# Patient Record
Sex: Female | Born: 1965 | Race: Black or African American | Hispanic: No | Marital: Married | Smoking: Current every day smoker
Health system: Southern US, Community
[De-identification: ages and names within clinical notes are randomized; demographics above are authoritative.]

## PROBLEM LIST (undated history)

## (undated) DIAGNOSIS — G43909 Migraine, unspecified, not intractable, without status migrainosus: Secondary | ICD-10-CM

## (undated) DIAGNOSIS — J45909 Unspecified asthma, uncomplicated: Secondary | ICD-10-CM

## (undated) DIAGNOSIS — J449 Chronic obstructive pulmonary disease, unspecified: Secondary | ICD-10-CM

## (undated) DIAGNOSIS — G8929 Other chronic pain: Secondary | ICD-10-CM

## (undated) DIAGNOSIS — F419 Anxiety disorder, unspecified: Secondary | ICD-10-CM

## (undated) DIAGNOSIS — R319 Hematuria, unspecified: Secondary | ICD-10-CM

## (undated) DIAGNOSIS — R202 Paresthesia of skin: Secondary | ICD-10-CM

## (undated) DIAGNOSIS — K589 Irritable bowel syndrome without diarrhea: Secondary | ICD-10-CM

## (undated) DIAGNOSIS — IMO0002 Reserved for concepts with insufficient information to code with codable children: Secondary | ICD-10-CM

## (undated) DIAGNOSIS — K219 Gastro-esophageal reflux disease without esophagitis: Secondary | ICD-10-CM

## (undated) DIAGNOSIS — D649 Anemia, unspecified: Secondary | ICD-10-CM

## (undated) DIAGNOSIS — I10 Essential (primary) hypertension: Secondary | ICD-10-CM

## (undated) DIAGNOSIS — M549 Dorsalgia, unspecified: Secondary | ICD-10-CM

## (undated) DIAGNOSIS — M542 Cervicalgia: Secondary | ICD-10-CM

## (undated) HISTORY — PX: ULNAR NERVE REPAIR: SHX2594

## (undated) HISTORY — DX: Hematuria, unspecified: R31.9

## (undated) HISTORY — PX: TUBAL LIGATION: SHX77

## (undated) HISTORY — DX: Reserved for concepts with insufficient information to code with codable children: IMO0002

## (undated) HISTORY — DX: Migraine, unspecified, not intractable, without status migrainosus: G43.909

## (undated) HISTORY — PX: HERNIA REPAIR: SHX51

---

## 1997-12-21 ENCOUNTER — Inpatient Hospital Stay (HOSPITAL_COMMUNITY): Admission: AD | Admit: 1997-12-21 | Discharge: 1997-12-21 | Payer: Self-pay | Admitting: *Deleted

## 1997-12-27 ENCOUNTER — Inpatient Hospital Stay (HOSPITAL_COMMUNITY): Admission: AD | Admit: 1997-12-27 | Discharge: 1997-12-27 | Payer: Self-pay | Admitting: *Deleted

## 1998-02-10 ENCOUNTER — Encounter (HOSPITAL_COMMUNITY): Admission: RE | Admit: 1998-02-10 | Discharge: 1998-02-11 | Payer: Self-pay | Admitting: *Deleted

## 1998-02-10 ENCOUNTER — Inpatient Hospital Stay (HOSPITAL_COMMUNITY): Admission: AD | Admit: 1998-02-10 | Discharge: 1998-02-12 | Payer: Self-pay | Admitting: *Deleted

## 1999-02-02 ENCOUNTER — Emergency Department (HOSPITAL_COMMUNITY): Admission: RE | Admit: 1999-02-02 | Discharge: 1999-02-02 | Payer: Self-pay | Admitting: *Deleted

## 1999-03-05 ENCOUNTER — Encounter: Payer: Self-pay | Admitting: *Deleted

## 1999-03-05 ENCOUNTER — Ambulatory Visit (HOSPITAL_COMMUNITY): Admission: RE | Admit: 1999-03-05 | Discharge: 1999-03-05 | Payer: Self-pay | Admitting: *Deleted

## 2000-04-14 ENCOUNTER — Emergency Department (HOSPITAL_COMMUNITY): Admission: EM | Admit: 2000-04-14 | Discharge: 2000-04-14 | Payer: Self-pay | Admitting: Emergency Medicine

## 2000-04-25 ENCOUNTER — Emergency Department (HOSPITAL_COMMUNITY): Admission: EM | Admit: 2000-04-25 | Discharge: 2000-04-26 | Payer: Self-pay | Admitting: Emergency Medicine

## 2000-04-25 ENCOUNTER — Emergency Department (HOSPITAL_COMMUNITY): Admission: EM | Admit: 2000-04-25 | Discharge: 2000-04-25 | Payer: Self-pay | Admitting: Emergency Medicine

## 2002-07-05 ENCOUNTER — Encounter: Admission: RE | Admit: 2002-07-05 | Discharge: 2002-08-17 | Payer: Self-pay | Admitting: Family Medicine

## 2002-08-14 ENCOUNTER — Emergency Department (HOSPITAL_COMMUNITY): Admission: EM | Admit: 2002-08-14 | Discharge: 2002-08-14 | Payer: Self-pay | Admitting: Emergency Medicine

## 2003-04-30 ENCOUNTER — Emergency Department (HOSPITAL_COMMUNITY): Admission: EM | Admit: 2003-04-30 | Discharge: 2003-04-30 | Payer: Self-pay

## 2003-05-10 ENCOUNTER — Other Ambulatory Visit: Admission: RE | Admit: 2003-05-10 | Discharge: 2003-05-10 | Payer: Self-pay | Admitting: Obstetrics and Gynecology

## 2003-08-27 ENCOUNTER — Inpatient Hospital Stay (HOSPITAL_COMMUNITY): Admission: AD | Admit: 2003-08-27 | Discharge: 2003-08-27 | Payer: Self-pay | Admitting: Obstetrics and Gynecology

## 2003-09-21 ENCOUNTER — Encounter: Admission: RE | Admit: 2003-09-21 | Discharge: 2003-09-21 | Payer: Self-pay | Admitting: Obstetrics and Gynecology

## 2003-10-14 ENCOUNTER — Encounter: Admission: RE | Admit: 2003-10-14 | Discharge: 2003-10-14 | Payer: Self-pay | Admitting: Obstetrics and Gynecology

## 2003-10-25 ENCOUNTER — Encounter: Admission: RE | Admit: 2003-10-25 | Discharge: 2003-10-25 | Payer: Self-pay | Admitting: Obstetrics and Gynecology

## 2003-10-28 ENCOUNTER — Inpatient Hospital Stay (HOSPITAL_COMMUNITY): Admission: AD | Admit: 2003-10-28 | Discharge: 2003-10-31 | Payer: Self-pay | Admitting: Obstetrics and Gynecology

## 2003-10-28 ENCOUNTER — Encounter (INDEPENDENT_AMBULATORY_CARE_PROVIDER_SITE_OTHER): Payer: Self-pay | Admitting: Specialist

## 2003-12-26 ENCOUNTER — Other Ambulatory Visit: Admission: RE | Admit: 2003-12-26 | Discharge: 2003-12-26 | Payer: Self-pay | Admitting: Obstetrics and Gynecology

## 2004-01-27 ENCOUNTER — Ambulatory Visit (HOSPITAL_COMMUNITY): Admission: RE | Admit: 2004-01-27 | Discharge: 2004-01-27 | Payer: Self-pay | Admitting: Obstetrics and Gynecology

## 2004-04-03 ENCOUNTER — Emergency Department (HOSPITAL_COMMUNITY): Admission: EM | Admit: 2004-04-03 | Discharge: 2004-04-04 | Payer: Self-pay | Admitting: Emergency Medicine

## 2005-04-29 ENCOUNTER — Emergency Department (HOSPITAL_COMMUNITY): Admission: EM | Admit: 2005-04-29 | Discharge: 2005-04-29 | Payer: Self-pay | Admitting: Emergency Medicine

## 2005-11-11 ENCOUNTER — Emergency Department (HOSPITAL_COMMUNITY): Admission: EM | Admit: 2005-11-11 | Discharge: 2005-11-11 | Payer: Self-pay | Admitting: Emergency Medicine

## 2005-11-13 ENCOUNTER — Emergency Department (HOSPITAL_COMMUNITY): Admission: EM | Admit: 2005-11-13 | Discharge: 2005-11-14 | Payer: Self-pay | Admitting: Emergency Medicine

## 2006-03-04 ENCOUNTER — Emergency Department (HOSPITAL_COMMUNITY): Admission: EM | Admit: 2006-03-04 | Discharge: 2006-03-04 | Payer: Self-pay | Admitting: Emergency Medicine

## 2006-12-07 ENCOUNTER — Emergency Department (HOSPITAL_COMMUNITY): Admission: EM | Admit: 2006-12-07 | Discharge: 2006-12-07 | Payer: Self-pay | Admitting: Emergency Medicine

## 2007-02-14 ENCOUNTER — Emergency Department (HOSPITAL_COMMUNITY): Admission: EM | Admit: 2007-02-14 | Discharge: 2007-02-14 | Payer: Self-pay | Admitting: Emergency Medicine

## 2008-03-09 ENCOUNTER — Emergency Department (HOSPITAL_COMMUNITY): Admission: EM | Admit: 2008-03-09 | Discharge: 2008-03-10 | Payer: Self-pay | Admitting: Emergency Medicine

## 2008-08-02 ENCOUNTER — Encounter: Admission: RE | Admit: 2008-08-02 | Discharge: 2008-08-02 | Payer: Self-pay | Admitting: Gastroenterology

## 2008-10-15 ENCOUNTER — Emergency Department (HOSPITAL_COMMUNITY): Admission: EM | Admit: 2008-10-15 | Discharge: 2008-10-15 | Payer: Self-pay | Admitting: Emergency Medicine

## 2008-11-10 ENCOUNTER — Ambulatory Visit (HOSPITAL_COMMUNITY): Admission: RE | Admit: 2008-11-10 | Discharge: 2008-11-11 | Payer: Self-pay | Admitting: Surgery

## 2009-01-18 ENCOUNTER — Emergency Department (HOSPITAL_COMMUNITY): Admission: EM | Admit: 2009-01-18 | Discharge: 2009-01-18 | Payer: Self-pay | Admitting: *Deleted

## 2010-02-27 ENCOUNTER — Emergency Department (HOSPITAL_COMMUNITY): Admission: EM | Admit: 2010-02-27 | Discharge: 2010-02-27 | Payer: Self-pay | Admitting: Emergency Medicine

## 2010-05-27 ENCOUNTER — Emergency Department (HOSPITAL_COMMUNITY): Admission: EM | Admit: 2010-05-27 | Discharge: 2010-05-27 | Payer: Self-pay | Admitting: Emergency Medicine

## 2010-06-04 ENCOUNTER — Emergency Department (HOSPITAL_COMMUNITY): Admission: EM | Admit: 2010-06-04 | Discharge: 2010-06-05 | Payer: Self-pay | Admitting: Emergency Medicine

## 2010-06-27 IMAGING — CR DG CHEST 2V
2 series · 2 of 2 positions shown · non-contrast
Comparison: Chest x-ray 03/09/2008

CLINICAL DATA: Sore throat and cough

CHEST - 2 VIEW

[w chest pa *]
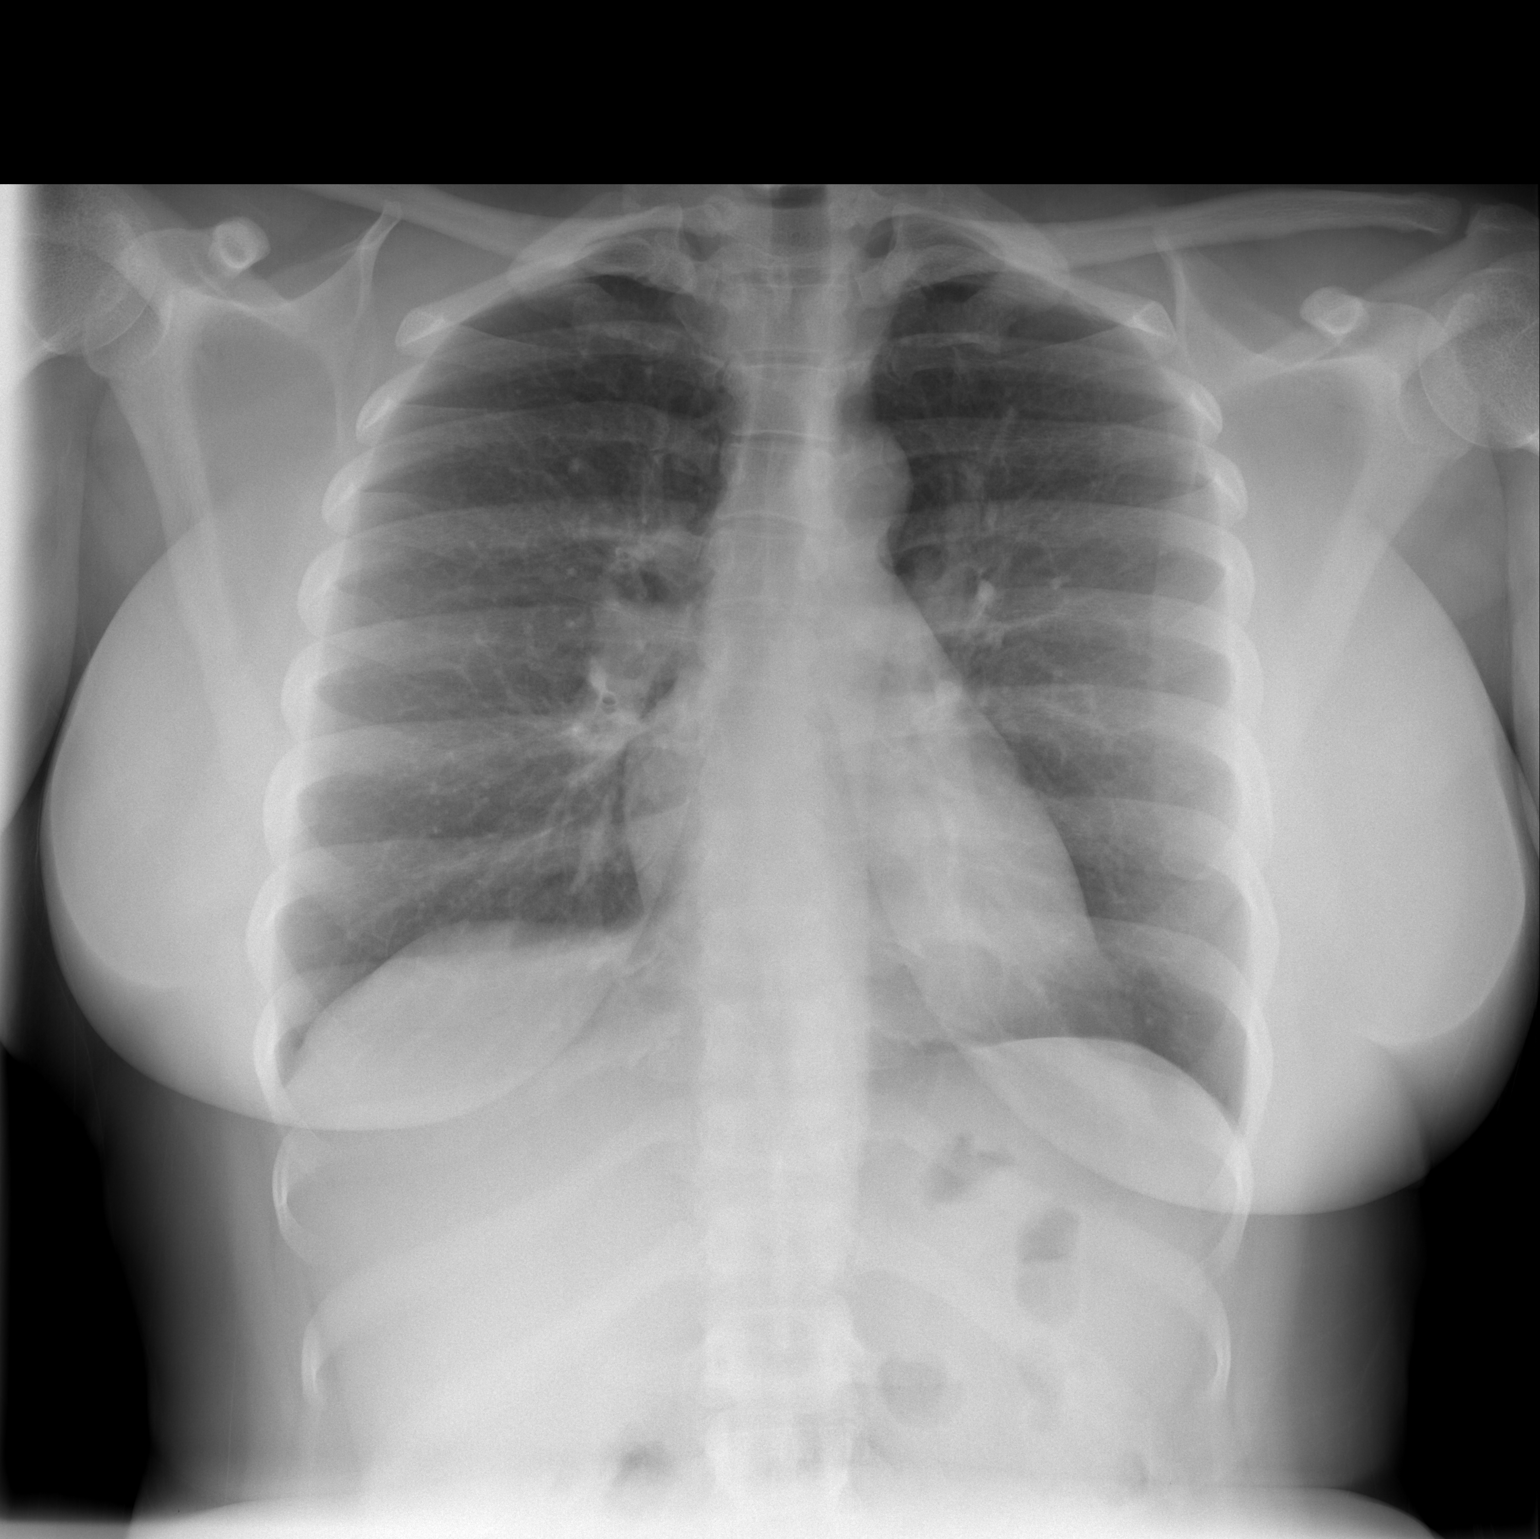

[w chest lat *]
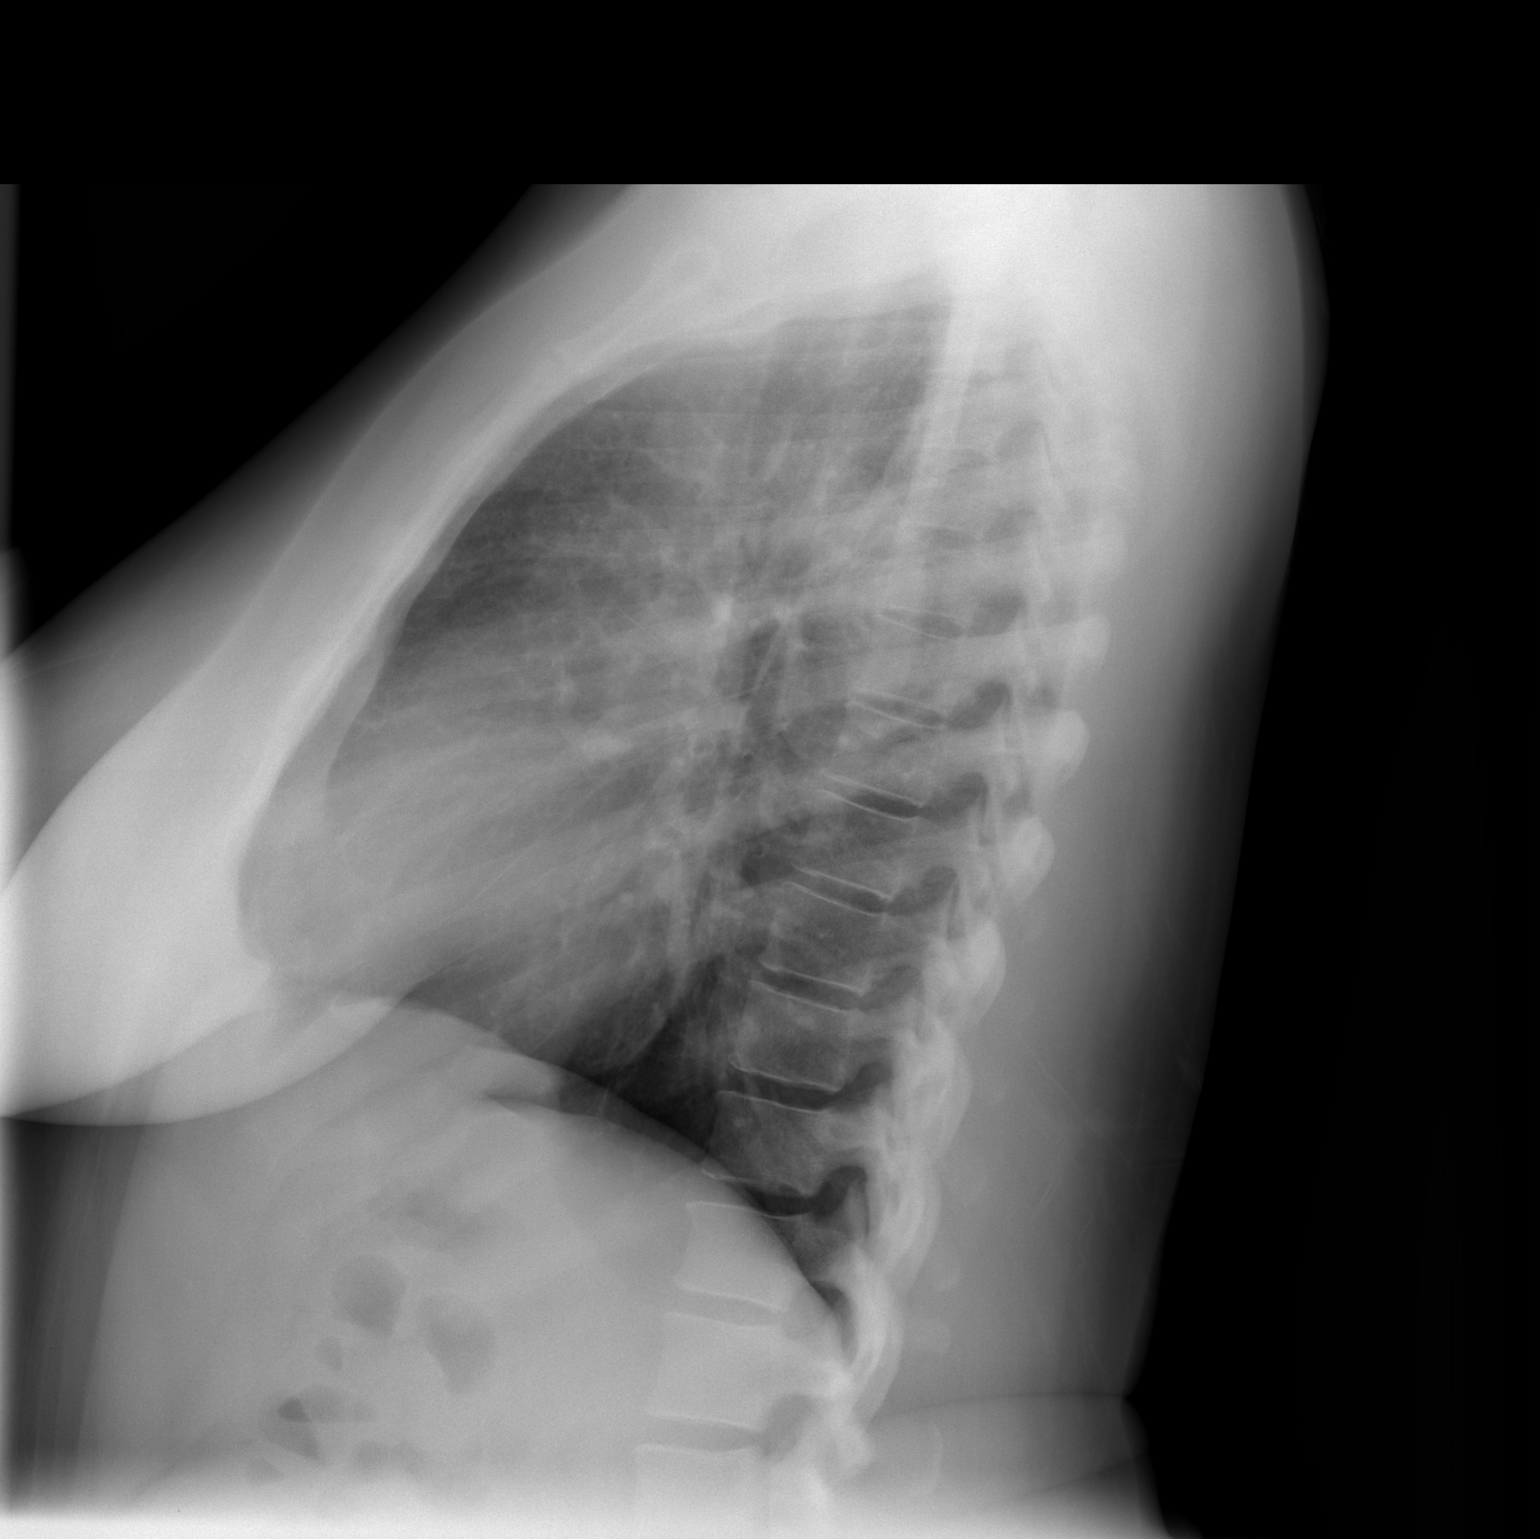

[2 of 2 positions shown; findings below may reference images not displayed]

FINDINGS: The heart and mediastinal contours are within normal
limits.  Both lungs are clear.  It is noted that the bronchitic
changes seen on the chest radiograph February 2008 have resolved.
No pleural effusion, pneumothorax, or edema.

The upper abdomen is unremarkable.  No acute osseous abnormality.
IMPRESSION: No evidence of acute cardiopulmonary disease.

## 2010-10-06 ENCOUNTER — Encounter: Payer: Self-pay | Admitting: Family Medicine

## 2010-10-08 ENCOUNTER — Encounter: Payer: Self-pay | Admitting: Family Medicine

## 2010-10-15 ENCOUNTER — Emergency Department (HOSPITAL_COMMUNITY)
Admission: EM | Admit: 2010-10-15 | Discharge: 2010-10-15 | Payer: Self-pay | Source: Home / Self Care | Admitting: Emergency Medicine

## 2010-10-15 LAB — URINALYSIS, ROUTINE W REFLEX MICROSCOPIC
Bilirubin Urine: NEGATIVE
Ketones, ur: NEGATIVE mg/dL
Leukocytes, UA: NEGATIVE
Nitrite: NEGATIVE
Urine Glucose, Fasting: NEGATIVE mg/dL
Urobilinogen, UA: 0.2 mg/dL (ref 0.0–1.0)
pH: 6.5 (ref 5.0–8.0)

## 2010-10-15 LAB — URINE MICROSCOPIC-ADD ON

## 2010-11-29 LAB — CBC
HCT: 40.5 % (ref 36.0–46.0)
Hemoglobin: 13.6 g/dL (ref 12.0–15.0)
MCH: 29.1 pg (ref 26.0–34.0)
MCHC: 33.6 g/dL (ref 30.0–36.0)
MCV: 86.5 fL (ref 78.0–100.0)
Platelets: 357 10*3/uL (ref 150–400)
RDW: 14.9 % (ref 11.5–15.5)

## 2010-11-29 LAB — POCT I-STAT, CHEM 8
BUN: 12 mg/dL (ref 6–23)
Hemoglobin: 13.3 g/dL (ref 12.0–15.0)
Sodium: 142 mEq/L (ref 135–145)
TCO2: 28 mmol/L (ref 0–100)

## 2010-11-29 LAB — DIFFERENTIAL
Basophils Absolute: 0.1 10*3/uL (ref 0.0–0.1)
Lymphocytes Relative: 31 % (ref 12–46)
Lymphs Abs: 2.5 10*3/uL (ref 0.7–4.0)
Monocytes Absolute: 0.4 10*3/uL (ref 0.1–1.0)
Monocytes Relative: 5 % (ref 3–12)
Neutro Abs: 5 10*3/uL (ref 1.7–7.7)
Neutrophils Relative %: 62 % (ref 43–77)

## 2010-11-29 LAB — POCT CARDIAC MARKERS: Myoglobin, poc: 32.4 ng/mL (ref 12–200)

## 2011-01-01 LAB — DIFFERENTIAL
Basophils Absolute: 0 10*3/uL (ref 0.0–0.1)
Basophils Relative: 0 % (ref 0–1)
Eosinophils Absolute: 0 10*3/uL (ref 0.0–0.7)
Eosinophils Relative: 1 % (ref 0–5)
Lymphs Abs: 1.5 10*3/uL (ref 0.7–4.0)
Monocytes Relative: 6 % (ref 3–12)
Neutro Abs: 3.7 10*3/uL (ref 1.7–7.7)
Neutrophils Relative %: 66 % (ref 43–77)

## 2011-01-01 LAB — BASIC METABOLIC PANEL
Chloride: 103 mEq/L (ref 96–112)
GFR calc non Af Amer: >60 mL/min (ref >60)
Potassium: 3.5 mEq/L (ref 3.5–5.1)
Sodium: 137 mEq/L (ref 135–145)

## 2011-01-01 LAB — CBC
HCT: 37.9 % (ref 36.0–46.0)
MCHC: 33.7 g/dL (ref 30.0–36.0)
MCV: 85.5 fL (ref 78.0–100.0)
Platelets: 375 10*3/uL (ref 150–400)
WBC: 5.6 10*3/uL (ref 4.0–10.5)

## 2011-01-29 NOTE — Op Note (Signed)
NAMELEATA, DOMINY               ACCOUNT NO.:  [id number]   MEDICAL RECORD NO.:  [id number]          PATIENT TYPE:  OIB   LOCATION:  1531                         FACILITY:  [facility]   PHYSICIAN:  Wilmon Arms. Corliss Skains, M.D. DATE OF BIRTH:  [birth year]   DATE OF PROCEDURE:  11/10/2008  DATE OF DISCHARGE:                               OPERATIVE REPORT   PREOPERATIVE DIAGNOSIS:  Ventral hernia.   POSTOPERATIVE DIAGNOSIS:  Ventral hernia.   PROCEDURE PERFORMED:  Laparoscopic ventral hernia repair with mesh.   SURGEON:  Wilmon Arms. Tsuei, M.D.   ANESTHESIA:  General.   INDICATIONS:  The patient is a 45 year old female who has been followed  for some time for intermittent abdominal pain and chronic constipation.  A CT scan in November 2009 showed a ventral hernia positioned about 5 cm  above the umbilicus containing only omental fat but no sign of bowel  obstruction.  This has become fairly uncomfortable especially with her  job.  The pain is relieved when she is supine.  She presents now for  laparoscopic hernia repair.   DESCRIPTION OF PROCEDURE:  The patient was brought to the operating  room, placed in supine position on the operating table.  After an  adequate level of general anesthesia was obtained, a Foley catheter was  placed under sterile technique.  The patient's abdomen was prepped with  Betadine and draped in sterile fashion.  A time-out was taken to ensure  the proper patient, proper procedure.  A 12 mm Fios trocar was used to  cannulate the peritoneal cavity in the left upper quadrant just below  the costal margin.  We insufflated CO2 maintaining maximal pressure of  15 mmHg.  Laparoscope was inserted.  We could visualize omentum entering  a small hernia defect right at the end of the falciform ligament.  Two 5  mL ports were placed lower on the left side.  Using manual pressure and  gentle traction on the omentum, we were able to reduce the hernia  completely.  The defect  appeared to be about 2 cm across.  This was  right at the edge of the falciform ligament.  The harmonic scalpel was  used to take down the falciform ligament for several centimeters  superiorly.  We inspected the remainder of abdominal wall.  No other  defects were noted.  A spinal needle was used to outline the defect on  the skin.  We selected a 10 x 15 cm piece of Proceed mesh which I cut  into a 10-cm circle.  We placed 4 stay sutures of 0 Novofil.  The mesh  was inserted into the peritoneal cavity and was splayed open.  Four stab  incisions were used to pass the Endoclose device to pull up the stay  sutures.  We had to adjust a couple of these to provide adequate spread  of the mesh.  Once we had adequate coverage of the hernia defect and  several centimeters overlap in all directions, the stay sutures were  tied down.  We then used the Pro Tack device to secure the edges  of the  mesh.  We had good coverage without any undue tension.  We then switched  to a 5 mm laparoscope in the left lower quadrant.  The Fios trocar was  removed and we closed the port site with a 0 Vicryl using the Endoclose  device.  We again inspected the mesh and it is in good place with no  bleeding.  We released pneumoperitoneum as the trocars were removed.  4-  0 Monocryl was used to close the trocar sites.  Dermabond was used to  seal all the skin incisions.  The patient was then extubated and brought  to recovery room in stable condition.  A Foley catheter was removed.  All sponge, instrument, and needle counts were correct.      Wilmon Arms. Tsuei, M.D.  Electronically Signed     MKT/MEDQ  D:  11/10/2008  T:  11/10/2008  Job:  [id number]

## 2011-02-01 NOTE — Op Note (Signed)
NAME:  Joan [name], Joan [name]                         ACCOUNT NO.:  [id number]   MEDICAL RECORD NO.:  [id number]                   PATIENT TYPE:  AMB   LOCATION:  SDC                                  [facility]ACILITY:  [facility]   PHYSICIAN:  James A. Ashley Royalty, M.D.             DATE OF BIRTH:  March 18, 1966   DATE OF PROCEDURE:  01/27/2004  DATE OF DISCHARGE:  01/27/2004                                 OPERATIVE REPORT   PREOPERATIVE DIAGNOSIS:  Desire for attempt at permanent surgical  sterilization.   POSTOPERATIVE DIAGNOSES:  1. Desire for attempt at permanent surgical sterilization.  2. Adhesion omentum, anterior abdominal wall.   PROCEDURE:  1. Laparoscopic bilateral tubal sterilization procedure (bipolar cautery).  2. Lysis of adhesions.   SURGEON:  Rudy Jew. Ashley Royalty, M.D.   ANESTHESIA:  General.   ESTIMATED BLOOD LOSS:  25 mL.   COMPLICATIONS:  None.   PACKS AND DRAINS:  None.   DESCRIPTION OF PROCEDURE:  The patient was taken to the operating room and  placed in the dorsal supine position.  After adequate general anesthesia was  administered she was placed in the lithotomy position, prepped and draped in  the usual manner for abdominal and vaginal surgery. A posterior weighted  retracted was placed per vagina.  The anterior lip of the cervix was grasped  with a single-tip tenaculum.  Jarcho uterine manipulator was placed per  cervix and help in place with the deep tenaculum.  The bladder was drained  with a red rubber catheter.  Next, a 1.2 cm infraumbilical incision and the  size 10-11 disposable laparoscopic trocar was placed in the abdominal  cavity.  Its location was verified by placement of the laparoscope.  There  was no evidence of any trauma.  Pneumoperitoneum was created with CO2 and  maintained throughout the procedure.  The abdomen and pelvis were thoroughly  surveyed.  Immediately upon placement of the laparoscope a dense adhesion  was noted from the omentum to the anterior  abdominal wall approximately half  way from the umbilicus to the symphysis.  The  5 mm trocars were placed in  the left and right lower quadrants, respectively to aid in the dissection  and the tubal sterilization procedure.  Direct visualization and  transillumination techniques were employed.  The uterus was normal size,  shape and contour without evidence of any endometriosis or fibroids.  The  ovaries were normal size, shape and contour bilaterally.  There were no  cysts or endometriotic lesions.  The fallopian tubes were short in length  and rather large caliber.  The operator felt the diameter of the tubes was  too large to successfully place Falope rings.  The remainder of the  peritoneal surfaces was smooth and glistening.  At this point, attention was  turned to the lysis of adhesions.  The dense adhesion from the omentum to  the anterior abdominal wall required lysis in order  to effectively carry out  the tubal sterilization procedure.  Bipolar cautery was employed in order to  coagulate the area to be cut.  Careful evaluation revealed no evidence of  any bowel in the vicinity of a proposed area for lysis.  Successful lysis of  adhesions was accomplished with the scissors.  Hemostasis was noted.   Attention was then turned to the tubal sterilization.  The [place] generator  was employed with the Kleppinger forceps at setting #25.  Then 4 cm of the  right fallopian tube were coagulated thoroughly being very careful to  document 0 current before succession of cauterization.  As the fallopian  tubes were short, this coagulation involved roughly 80% of the length of the  fallopian tube.  The identical procedure was employed on the left side with  identical results.  Approximately 80% of the fallopian tube was cauterized,  4 cm total length.  Careful attention was paid to document 0 current prior  to cessation of cauterization.  Appropriate photos were obtained before and  after.   Hemostasis was noted.   At this point, the patient was felt to have benefited maximally from the  surgical procedure.  The abdominal instruments were removed and  pneumoperitoneum evacuated.  The fascial defects were closed with 0 Vicryl  in an interrupted fashion.  The skin was closed with 3-0 Monocryl in a  subcuticular fashion.   The patient tolerated the procedure extremely and was returned to the  recovery room in good condition.                                               James A. Ashley Royalty, M.D.    JAM/MEDQ  D:  01/29/2004  T:  01/29/2004  Job:  [id number]

## 2011-02-01 NOTE — Consult Note (Signed)
NAME:  Joan [name], Joan [name]                         ACCOUNT NO.:  [id number]   MEDICAL RECORD NO.:  [id number]                   PATIENT TYPE:  MAT   LOCATION:  MATC                                 FACILITY:  [facility]   PHYSICIAN:  Juan H. Lily Peer, M.D.             DATE OF BIRTH:  01/30/1966   DATE OF CONSULTATION:  08/27/2003  DATE OF DISCHARGE:                                   CONSULTATION   HISTORY:  The patient is a 45 year old gravida 5, para 3, AB 1, currently at  [redacted] weeks gestation presented to [facility] today complaining of  symptoms of esophageal reflux, midepigastric discomfort and patient with a  longstanding history of irritable bowel syndrome.  The patient states that  sometimes she goes three weeks without having a bowel movement and had been  on Zelnorm, but this was discontinued when she found out she was pregnant.  She had a bowel movement yesterday after 2.5 weeks without having had one.  This evening she had eaten a sandwich, had vomited and had had some  midepigastric discomfort and reflux with a metallic taste in her mouth.  She  states that this has been going on for several weeks.  She saw her primary  physician, Dr. Sylvester Harder, who had given her Phenergan, which helped,  but she is no longer taking it because she ran out.  She does not appear to  be in any acute distress and her pain level improved after she had a  cocktail consisting of Mylanta, Xylocaine and Donnagel.  We also gave her 10  mg of Reglan and monitored.   PHYSICAL EXAMINATION:  VITAL SIGNS:  The patient's blood pressure is 143/89,  temperature 97.2, respirations 12, and heart rate 78.  ABDOMEN:  On exam the patient does not have any rebound or guarding.  Negative Murphy's.  Negative tenderness over McBurney's point. She had good  bowel sounds.  PELVIC:  Pelvic examination is not done due to the fact that she is not  experiencing any contractions on the monitor.  Reassuring fetal heart  tracing it noted.   It was decided to proceed with obtaining  CBC, comprehensive metabolic  panel, amylase and lipase.  The patient was anxious to go home; had the  blood drawn.  We will notify her if there are any abnormality in the  results.  She was encouraged to eat a high fiber diet, keep up with her  fluids and to follow up with Dr. Ashley Royalty next week.  The possibility of  considering of doing an upper abdominal ultrasound will be made.  She will  be placed on Prevacid for her gastroesophageal reflux, 30 mg daily, and for  nausea I have given her prescription for Reglan 10 mg to take 1 p.o. t.i.d.  p.r.n.   The patient also smokes a half-pack of cigarettes per day and was instructed  to discontinue this because of the  detrimental effects  to her and her fetus  as well.  She state that she has an appointment next week with Dr. Sylvester Harder  for a three-hour GTT because she did not pass the diabetes screen  at28 weeks gestation.  She did have her RhoGAM last week.   All the above was discussed with the patient.  Instructions were provided.  We did recommend that in the event she goes four or five days without bowel  movement she can take a bottle of magnesium citrate instead of her waiting  three weeks to have a bowel movement; and, perhaps a GI consultation as an  outpatient would be warranted as well.                                               Juan H. Lily Peer, M.D.    JHF/MEDQ  D:  08/27/2003  T:  08/28/2003  Job:  [id number]   cc:   Fayrene Fearing A. Ashley Royalty, M.D.  [address]  [place], [place] [address]  Fax: [phone]

## 2011-02-01 NOTE — Op Note (Signed)
NAME:  Joan [name], Joan [name]                         ACCOUNT NO.:  [id number]   MEDICAL RECORD NO.:  [id number]                   PATIENT TYPE:  INP   LOCATION:  9103                                 [facility]ACILITY[facility]   PHYSICIAN:  James A. Ashley Royalty, M.D.             DATE OF BIRTH:  1965-12-08   DATE OF PROCEDURE:  10/28/2003  DATE OF DISCHARGE:                                 OPERATIVE REPORT   PREOPERATIVE DIAGNOSES:  1. Intrauterine pregnancy at 39 weeks.  2. Gestational diabetes.  3. Advanced maternal age.  4. Smoker.  5. Rh negative.  6. Nonreassuring fetal heart rate tracing with severe variable decelerations     and bradycardia.   POSTOPERATIVE DIAGNOSES:  1. Intrauterine pregnancy at 39 weeks.  2. Gestational diabetes.  3. Advanced maternal age.  4. Smoker.  5. Rh negative.  6. Nonreassuring fetal heart rate tracing with severe variable decelerations     and bradycardia.  7. Pathology pending.   PROCEDURE:  Primary low transverse cesarean section.   SURGEON:  Rudy Jew. Ashley Royalty, M.D.   ANESTHESIA:  Epidural.   FINDINGS:  A 6 pound 9 ounce female, Apgars 9 at one minute, 9 at five  minutes.  Arterial cord pH result is not back at the time of this dictation.   ESTIMATED BLOOD LOSS:  600 mL.   COMPLICATIONS:  None.   PACKS AND DRAINS:  Foley.   Sponge, needle, and instrument count reported as correct x2.   DESCRIPTION OF PROCEDURE:  The patient was taken to the operating room and  placed in the dorsal supine position.  She was prepped and draped in the  usual manner for abdominal surgery.  A Foley catheter had already been  placed.  A Pfannenstiel incision was made down to the level of the fascia,  which was nicked with a knife and incised transversely with Mayo scissors.  The underlying rectus muscles were separated from the overlying fascia using  sharp and blunt dissection.  The rectus muscles were separated in the  midline, exposing the peritoneum, which was elevated  with hemostats and  entered traumatically with Metzenbaum scissors.  The incision was extended  longitudinally.  The uterus was identified and a bladder flap created by  incising the anterior uterine serosa and sharply and bluntly dissecting the  bladder inferiorly.  It was held in place with a bladder blade.  The uterus  was then entered through a combination of sharp and blunt dissection through  a low transverse incision.  The amniotic fluid was noted to be clear.  The  infant was delivered from a vertex presentation in an atraumatic manner.  The infant was suctioned.  The cord was triply clamped, cut, and the infant  given immediately to the waiting pediatrics team.  Arterial cord pH was  obtained from an isolated segment.  Then regular cord blood was obtained.  The placenta and membranes were removed  in their entirety and submitted to  pathology for histologic studies.  The uterus was then exteriorized.  The  uterus was closed in two running layers of #1 Vicryl, the first using a  running locking layer.  The second was a running, intermittently locking and  imbricating layer.  Two additional figure-of-eight sutures were required to  obtain hemostasis.  Hemostasis was noted.  Copious irrigation was  accomplished.  Hemostasis was noted.  Next the fascia was closed with 0  Vicryl in a running fashion.  The skin was closed with staples.  The patient  tolerated the procedure extremely well and was returned to the recovery room  in good condition .                                               James A. Ashley Royalty, M.D.    JAM/MEDQ  D:  10/28/2003  T:  10/28/2003  Job:  [id number]

## 2011-02-01 NOTE — Discharge Summary (Signed)
NAME:  Joan [name], Joan [name]                         ACCOUNT NO.:  [id number]   MEDICAL RECORD NO.:  [id number]                   PATIENT TYPE:  INP   LOCATION:  9103                                 [facility]   PHYSICIAN:  James A. Ashley Royalty, M.D.             DATE OF BIRTH:  04/17/1966   DATE OF ADMISSION:  10/28/2003  DATE OF DISCHARGE:  10/31/2003                                 DISCHARGE SUMMARY   DISCHARGE DIAGNOSES:  1. Intrauterine pregnancy at [redacted] weeks gestation, delivered.  2. Gestational diabetes mellitus.  3. Advanced maternal age.  4. Smoker.  5. Rh negative.  6. Nonreassuring fetal heart pattern necessitating cesarean section.   OPERATIONS AND SPECIAL PROCEDURES:  Primary low transverse cesarean section.   CONSULTATIONS:  None.   DISCHARGE MEDICATIONS:  Percocet or Tylox.   HISTORY AND PHYSICAL:  This is a 45 year old gravida 5 para 3-0-1-3 with the  aforementioned risk factors.  The patient was admitted for induction of  labor.  For the remainder of the history and physical please see chart.   HOSPITAL COURSE:  The patient was admitted to [facility].  Admission laboratory studies were drawn.  On October 28, 2003  she was noted to have fetal heart rate decelerations which failed to respond  to conservative measures.  Dilatation at that time was 5 cm.  The decision  was made to proceed with operative delivery.  Hence, the patient was taken  on October 28, 2003 to the operating room and underwent primary low  transverse cesarean section per Dr. Sylvester Harder.  The procedure yielded a  6-pound 9-ounce female, Apgars 9 at one minute and 9 at five minutes, sent to  the newborn nursery.  The patient's postoperative course was benign.  She  was discharged on postoperative day #3, afebrile and in satisfactory  condition.   ACCESSORY CLINICAL FINDINGS:  Hemoglobin and hematocrit on admission were  11.3 and 33.1 respectively.  Repeat values obtained  October 19, 2003 were  11.2 and 32.5 respectively.  RPR was nonreactive.   DISPOSITION:  The patient is to return to [facility] and Ob[facility]etrics  in 4-6 weeks for postpartum evaluation.                                               James A. Ashley Royalty, M.D.    JAM/MEDQ  D:  12/09/2003  T:  12/09/2003  Job:  [id number]

## 2011-03-24 ENCOUNTER — Emergency Department (HOSPITAL_COMMUNITY)
Admission: EM | Admit: 2011-03-24 | Discharge: 2011-03-24 | Disposition: A | Discharge status: Home / Self Care | Payer: BC Managed Care – PPO | Attending: Emergency Medicine | Admitting: Emergency Medicine

## 2011-03-24 DIAGNOSIS — G5620 Lesion of ulnar nerve, unspecified upper limb: Secondary | ICD-10-CM | POA: Insufficient documentation

## 2011-03-24 DIAGNOSIS — R209 Unspecified disturbances of skin sensation: Secondary | ICD-10-CM | POA: Insufficient documentation

## 2011-03-24 DIAGNOSIS — J45909 Unspecified asthma, uncomplicated: Secondary | ICD-10-CM | POA: Insufficient documentation

## 2011-03-24 DIAGNOSIS — I10 Essential (primary) hypertension: Secondary | ICD-10-CM | POA: Insufficient documentation

## 2011-10-07 ENCOUNTER — Other Ambulatory Visit: Payer: Self-pay | Admitting: Diagnostic Neuroimaging

## 2011-10-07 DIAGNOSIS — M79609 Pain in unspecified limb: Secondary | ICD-10-CM

## 2011-10-07 DIAGNOSIS — R200 Anesthesia of skin: Secondary | ICD-10-CM

## 2011-10-07 DIAGNOSIS — G629 Polyneuropathy, unspecified: Secondary | ICD-10-CM

## 2011-10-11 ENCOUNTER — Inpatient Hospital Stay
Admission: RE | Admit: 2011-10-11 | Discharge: 2011-10-11 | Payer: BC Managed Care – PPO | Source: Ambulatory Visit | Attending: Diagnostic Neuroimaging | Admitting: Diagnostic Neuroimaging

## 2011-10-11 ENCOUNTER — Other Ambulatory Visit: Payer: Self-pay | Admitting: Radiology

## 2011-10-11 ENCOUNTER — Ambulatory Visit
Admission: RE | Admit: 2011-10-11 | Discharge: 2011-10-11 | Disposition: A | Discharge status: Home / Self Care | Payer: BC Managed Care – PPO | Source: Ambulatory Visit | Attending: Diagnostic Neuroimaging | Admitting: Diagnostic Neuroimaging

## 2011-10-11 ENCOUNTER — Other Ambulatory Visit (HOSPITAL_COMMUNITY)
Admission: RE | Admit: 2011-10-11 | Discharge: 2011-10-11 | Disposition: A | Discharge status: Home / Self Care | Payer: BC Managed Care – PPO | Source: Ambulatory Visit | Attending: Radiology | Admitting: Radiology

## 2011-10-11 DIAGNOSIS — G589 Mononeuropathy, unspecified: Secondary | ICD-10-CM | POA: Insufficient documentation

## 2011-10-11 DIAGNOSIS — R209 Unspecified disturbances of skin sensation: Secondary | ICD-10-CM | POA: Insufficient documentation

## 2011-10-11 DIAGNOSIS — R6889 Other general symptoms and signs: Secondary | ICD-10-CM | POA: Insufficient documentation

## 2011-10-11 DIAGNOSIS — M79609 Pain in unspecified limb: Secondary | ICD-10-CM

## 2011-10-11 DIAGNOSIS — G629 Polyneuropathy, unspecified: Secondary | ICD-10-CM

## 2011-10-11 DIAGNOSIS — R200 Anesthesia of skin: Secondary | ICD-10-CM

## 2011-10-11 NOTE — Progress Notes (Signed)
Blood drawn for serum from right AC space without difficulty for lumbar puncture tests.  jkl

## 2011-10-14 ENCOUNTER — Other Ambulatory Visit: Payer: Self-pay | Admitting: Diagnostic Neuroimaging

## 2011-10-14 DIAGNOSIS — G971 Other reaction to spinal and lumbar puncture: Secondary | ICD-10-CM

## 2011-10-15 ENCOUNTER — Ambulatory Visit
Admission: RE | Admit: 2011-10-15 | Discharge: 2011-10-15 | Disposition: A | Discharge status: Home / Self Care | Payer: BC Managed Care – PPO | Source: Ambulatory Visit | Attending: Diagnostic Neuroimaging | Admitting: Diagnostic Neuroimaging

## 2011-10-15 DIAGNOSIS — G971 Other reaction to spinal and lumbar puncture: Secondary | ICD-10-CM

## 2011-10-15 NOTE — Progress Notes (Addendum)
0900 Explained discharge instructions to pt, she is aware as she had an LP last week.  0910 blood drawn with 21 g butterfly from right antecube, site is unremarkable and pt tolerated the procedure well, 20 cc's of blood obtained.  0912 to procedure room for blood patch.  1005 pt returned from procedure room and blood patch not done due to pending cultures per Dr. Bonnielee Haff.

## 2012-03-04 ENCOUNTER — Encounter (HOSPITAL_BASED_OUTPATIENT_CLINIC_OR_DEPARTMENT_OTHER): Payer: Self-pay | Admitting: *Deleted

## 2012-03-04 ENCOUNTER — Emergency Department (HOSPITAL_BASED_OUTPATIENT_CLINIC_OR_DEPARTMENT_OTHER): Payer: BC Managed Care – PPO

## 2012-03-04 ENCOUNTER — Emergency Department (HOSPITAL_BASED_OUTPATIENT_CLINIC_OR_DEPARTMENT_OTHER)
Admission: EM | Admit: 2012-03-04 | Discharge: 2012-03-04 | Disposition: A | Discharge status: Home / Self Care | Payer: BC Managed Care – PPO | Attending: Emergency Medicine | Admitting: Emergency Medicine

## 2012-03-04 DIAGNOSIS — R4789 Other speech disturbances: Secondary | ICD-10-CM | POA: Insufficient documentation

## 2012-03-04 DIAGNOSIS — I10 Essential (primary) hypertension: Secondary | ICD-10-CM | POA: Insufficient documentation

## 2012-03-04 DIAGNOSIS — E876 Hypokalemia: Secondary | ICD-10-CM

## 2012-03-04 DIAGNOSIS — J45909 Unspecified asthma, uncomplicated: Secondary | ICD-10-CM | POA: Insufficient documentation

## 2012-03-04 DIAGNOSIS — M62838 Other muscle spasm: Secondary | ICD-10-CM | POA: Insufficient documentation

## 2012-03-04 DIAGNOSIS — R29818 Other symptoms and signs involving the nervous system: Secondary | ICD-10-CM | POA: Insufficient documentation

## 2012-03-04 DIAGNOSIS — N390 Urinary tract infection, site not specified: Secondary | ICD-10-CM | POA: Insufficient documentation

## 2012-03-04 DIAGNOSIS — F17200 Nicotine dependence, unspecified, uncomplicated: Secondary | ICD-10-CM | POA: Insufficient documentation

## 2012-03-04 HISTORY — DX: Essential (primary) hypertension: I10

## 2012-03-04 HISTORY — DX: Unspecified asthma, uncomplicated: J45.909

## 2012-03-04 LAB — URINALYSIS, ROUTINE W REFLEX MICROSCOPIC
Bilirubin Urine: NEGATIVE
Glucose, UA: NEGATIVE mg/dL
Hgb urine dipstick: NEGATIVE
Ketones, ur: NEGATIVE mg/dL
Protein, ur: NEGATIVE mg/dL
Urobilinogen, UA: 1 mg/dL (ref 0.0–1.0)

## 2012-03-04 LAB — URINE MICROSCOPIC-ADD ON

## 2012-03-04 LAB — BASIC METABOLIC PANEL
BUN: 10 mg/dL (ref 6–23)
CO2: 26 mEq/L (ref 19–32)
Calcium: 8.9 mg/dL (ref 8.4–10.5)
Creatinine, Ser: 0.8 mg/dL (ref 0.50–1.10)
GFR calc non Af Amer: 89 mL/min — ABNORMAL LOW (ref >90)
Glucose, Bld: 103 mg/dL — ABNORMAL HIGH (ref 70–99)
Sodium: 142 mEq/L (ref 135–145)

## 2012-03-04 LAB — CBC
Hemoglobin: 11.9 g/dL — ABNORMAL LOW (ref 12.0–15.0)
MCH: 28.1 pg (ref 26.0–34.0)
MCHC: 34.8 g/dL (ref 30.0–36.0)
MCV: 80.9 fL (ref 78.0–100.0)
RBC: 4.23 MIL/uL (ref 3.87–5.11)

## 2012-03-04 LAB — PREGNANCY, URINE: Preg Test, Ur: NEGATIVE

## 2012-03-04 LAB — DIFFERENTIAL
Basophils Relative: 1 % (ref 0–1)
Eosinophils Absolute: 0.1 10*3/uL (ref 0.0–0.7)
Eosinophils Relative: 2 % (ref 0–5)
Lymphs Abs: 2.6 10*3/uL (ref 0.7–4.0)
Monocytes Absolute: 0.4 10*3/uL (ref 0.1–1.0)
Neutrophils Relative %: 46 % (ref 43–77)

## 2012-03-04 MED ORDER — POTASSIUM CHLORIDE CRYS ER 20 MEQ PO TBCR
40.0000 meq | EXTENDED_RELEASE_TABLET | Freq: Once | ORAL | Status: AC
  Administered 2012-03-04: 40 meq via ORAL
  Filled 2012-03-04: qty 2

## 2012-03-04 MED ORDER — NITROFURANTOIN MONOHYD MACRO 100 MG PO CAPS: 100.0000 mg | ORAL_CAPSULE | Freq: Two times a day (BID) | ORAL | Status: AC

## 2012-03-04 MED ORDER — LORAZEPAM 1 MG PO TABS
1.0000 mg | ORAL_TABLET | Freq: Once | ORAL | Status: AC
  Administered 2012-03-04: 1 mg via ORAL
  Filled 2012-03-04: qty 1

## 2012-03-04 NOTE — Discharge Instructions (Signed)
Foods Rich in Potassium Food / Potassium (mg)  Apricots, dried,  cup / 378 mg   Apricots, raw, 1 cup halves / 401 mg   Avocado,  / 487 mg   Banana, 1 large / 487 mg   Beef, lean, round, 3 oz / 202 mg   Cantaloupe, 1 cup cubes / 427 mg   Dates, medjool, 5 whole / 835 mg   Ham, cured, 3 oz / 212 mg   Lentils, dried,  cup / 458 mg   Lima beans, frozen,  cup / 258 mg   Orange, 1 large / 333 mg   Orange juice, 1 cup / 443 mg   Peaches, dried,  cup / 398 mg   Peas, split, cooked,  cup / 355 mg   Potato, boiled, 1 medium / 515 mg   Prunes, dried, uncooked,  cup / 318 mg   Raisins,  cup / 309 mg   Salmon, pink, raw, 3 oz / 275 mg   Sardines, canned , 3 oz / 338 mg   Tomato, raw, 1 medium / 292 mg   Tomato juice, 6 oz / 417 mg   Turkey, 3 oz / 349 mg  Document Released: 09/02/2005 Document Revised: 05/15/2011 Document Reviewed: 01/16/2009 [employer] Patient Information 2012 [employer]. 

## 2012-03-04 NOTE — ED Notes (Signed)
Patient transported to CT 

## 2012-03-04 NOTE — ED Notes (Signed)
Pt denies resp sx, pain or other sx. Pt with generalized upper extremity weakness. No slurred speech or facial drooping noted MAE

## 2012-03-04 NOTE — ED Notes (Signed)
Pt. Aware of a need for a urine sample. Unable to go at this time.

## 2012-03-04 NOTE — ED Notes (Signed)
Per EMS pt has a hx of " nerve problems" pt is unable to move or talk during episodes pt is aware of surroundings and is able to blink her eyes in response to questions pt is to follow up at [facility] next month. Pt is now alert and oriented and at her baseline

## 2012-03-04 NOTE — ED Provider Notes (Signed)
History     CSN: [id number]  Arrival date & time 03/04/12  0045   First MD Initiated Contact with Patient 03/04/12 0059      Chief Complaint  Patient presents with  . Neurologic Problem    (Consider location/radiation/quality/duration/timing/severity/associated sxs/prior treatment) Patient is a 46 y.o. female presenting with neurologic complaint. The history is provided by the patient.  Neurologic Problem Primary symptoms do not include headaches, syncope, loss of consciousness, altered mental status, seizures, dizziness, visual change, paresthesias, loss of sensation, memory loss, fever, nausea or vomiting. Primary symptoms comment: Whole body stiffness with inability to speak but able to blink and unastand commands during episodes The symptoms began more than 1 week ago (episodic for more than a year.  Dr. Marjory Lies states this is a rare condition.  Today lasted an hour instead of 15 minutes). The symptoms are resolved. The neurological symptoms are diffuse. Context: none.  Additional symptoms do not include neck stiffness, pain, lower back pain, leg pain, loss of balance, photophobia, aura, hallucinations, nystagmus, taste disturbance, hyperacusis, hearing loss, tinnitus, vertigo, anxiety, irritability or dysphoric mood. Medical issues do not include seizures or cerebral vascular accident. Workup history includes CT scan and lumbar puncture.    Past Medical History  Diagnosis Date  . Asthma   . Hypertension     Past Surgical History  Procedure Date  . Tubal ligation   . Hernia repair     History reviewed. No pertinent family history.  History  Substance Use Topics  . Smoking status: Current Everyday Smoker    Types: Cigarettes  . Smokeless tobacco: Not on file  . Alcohol Use: No    OB History    Grav Para Term Preterm Abortions TAB SAB Ect Mult Living                  Review of Systems  Constitutional: Negative for fever and irritability.  HENT: Negative for  hearing loss, neck pain, neck stiffness and tinnitus.   Eyes: Negative for photophobia.  Respiratory: Negative for cough and shortness of breath.   Cardiovascular: Negative for chest pain and syncope.  Gastrointestinal: Negative for nausea and vomiting.  Neurological: Negative for dizziness, vertigo, seizures, loss of consciousness, headaches, paresthesias and loss of balance.  Psychiatric/Behavioral: Negative for hallucinations, memory loss, dysphoric mood and altered mental status.  All other systems reviewed and are negative.    Allergies  Other  Home Medications   Current Outpatient Rx  Name Route Sig Dispense Refill  . ALBUTEROL SULFATE HFA 108 (90 BASE) MCG/ACT IN AERS Inhalation Inhale 2 puffs into the lungs every 6 (six) hours as needed.    Marland Kitchen OLMESARTAN MEDOXOMIL 20 MG PO TABS Oral Take 20 mg by mouth daily.    Marland Kitchen PHENTERMINE HCL 37.5 MG PO CAPS Oral Take 37.5 mg by mouth every morning.    Marland Kitchen ZOLPIDEM TARTRATE 10 MG PO TABS Oral Take 10 mg by mouth at bedtime as needed.    Marland Kitchen ZOLPIDEM TARTRATE 10 MG PO TABS Oral Take 10 mg by mouth at bedtime as needed.      BP 123/82  Pulse 70  Temp 98.2 F (36.8 C) (Oral)  Resp 20  SpO2 100%  LMP 02/19/2012  Physical Exam  Constitutional: She is oriented to person, place, and time. She appears well-developed and well-nourished. No distress.  HENT:  Head: Normocephalic and atraumatic.  Mouth/Throat: Oropharynx is clear and moist.  Eyes: Conjunctivae and EOM are normal. Pupils are equal, round, and reactive  to light.  Neck: Normal range of motion. Neck supple.  Cardiovascular: Normal rate and regular rhythm.   Pulmonary/Chest: Effort normal and breath sounds normal.  Abdominal: Soft. Bowel sounds are normal. There is no tenderness. There is no rebound and no guarding.  Neurological: She is alert and oriented to person, place, and time. She has normal reflexes. She displays normal reflexes. No cranial nerve deficit.  Skin: Skin is warm  and dry.  Psychiatric: She has a normal mood and affect.    ED Course  Procedures (including critical care time)   Labs Reviewed  CBC  DIFFERENTIAL  BASIC METABOLIC PANEL  URINALYSIS, ROUTINE W REFLEX MICROSCOPIC  PREGNANCY, URINE   No results found.   No diagnosis found.   Patient now fully moving all 4 extremities MDM  Case d/w Dr. Georgiana Shore or neurology.  Basic labs CT head and ativan.  Patient should have close follow up with Dr. Marjory Lies.  No indication for admission or additional imaging or testing at this time  Have repeated potassium, potassium not low enough to cause problem but will recommend MVI and treat UTI.  Patient needs to follow up this week with Dr. Marjory Lies.  Agrees to take all meds and follow up.  Return immediately to the closest ED for any weakness numbness, visual or speech changes.  Pt and husband verbalize understanding and agree to follow up  Lailana Shira Smitty Cords, MD 03/04/12 [phone]

## 2013-08-13 ENCOUNTER — Encounter (HOSPITAL_COMMUNITY): Payer: Self-pay | Admitting: Emergency Medicine

## 2013-08-13 ENCOUNTER — Observation Stay (HOSPITAL_COMMUNITY)
Admission: AD | Admit: 2013-08-13 | Discharge: 2013-08-14 | Disposition: A | Discharge status: Home / Self Care | Payer: BC Managed Care – PPO | Source: Ambulatory Visit | Attending: Obstetrics and Gynecology | Admitting: Obstetrics and Gynecology

## 2013-08-13 ENCOUNTER — Observation Stay (HOSPITAL_COMMUNITY): Payer: BC Managed Care – PPO

## 2013-08-13 ENCOUNTER — Emergency Department (HOSPITAL_COMMUNITY): Payer: BC Managed Care – PPO

## 2013-08-13 DIAGNOSIS — K589 Irritable bowel syndrome without diarrhea: Secondary | ICD-10-CM | POA: Insufficient documentation

## 2013-08-13 DIAGNOSIS — N939 Abnormal uterine and vaginal bleeding, unspecified: Secondary | ICD-10-CM

## 2013-08-13 DIAGNOSIS — R109 Unspecified abdominal pain: Secondary | ICD-10-CM

## 2013-08-13 DIAGNOSIS — D649 Anemia, unspecified: Secondary | ICD-10-CM

## 2013-08-13 DIAGNOSIS — N840 Polyp of corpus uteri: Secondary | ICD-10-CM

## 2013-08-13 DIAGNOSIS — F17200 Nicotine dependence, unspecified, uncomplicated: Secondary | ICD-10-CM | POA: Insufficient documentation

## 2013-08-13 DIAGNOSIS — N925 Other specified irregular menstruation: Principal | ICD-10-CM | POA: Insufficient documentation

## 2013-08-13 DIAGNOSIS — N949 Unspecified condition associated with female genital organs and menstrual cycle: Secondary | ICD-10-CM

## 2013-08-13 DIAGNOSIS — N938 Other specified abnormal uterine and vaginal bleeding: Secondary | ICD-10-CM

## 2013-08-13 DIAGNOSIS — G8929 Other chronic pain: Secondary | ICD-10-CM | POA: Insufficient documentation

## 2013-08-13 DIAGNOSIS — K5900 Constipation, unspecified: Secondary | ICD-10-CM | POA: Insufficient documentation

## 2013-08-13 LAB — WET PREP, GENITAL
Clue Cells Wet Prep HPF POC: NONE SEEN
Trich, Wet Prep: NONE SEEN
Yeast Wet Prep HPF POC: NONE SEEN

## 2013-08-13 LAB — CBC WITH DIFFERENTIAL/PLATELET
Basophils Relative: 0 % (ref 0–1)
Eosinophils Absolute: 0.2 10*3/uL (ref 0.0–0.7)
Eosinophils Relative: 2 % (ref 0–5)
Hemoglobin: 6.7 g/dL — CL (ref 12.0–15.0)
Lymphocytes Relative: 21 % (ref 12–46)
MCH: 24.4 pg — ABNORMAL LOW (ref 26.0–34.0)
Monocytes Absolute: 0.3 10*3/uL (ref 0.1–1.0)
Neutrophils Relative %: 73 % (ref 43–77)
Platelets: 342 10*3/uL (ref 150–400)
RBC: 2.75 MIL/uL — ABNORMAL LOW (ref 3.87–5.11)

## 2013-08-13 LAB — COMPREHENSIVE METABOLIC PANEL
ALT: 15 U/L (ref 0–35)
AST: 18 U/L (ref 0–37)
Alkaline Phosphatase: 111 U/L (ref 39–117)
CO2: 25 mEq/L (ref 19–32)
Calcium: 7.8 mg/dL — ABNORMAL LOW (ref 8.4–10.5)
Potassium: 2.8 mEq/L — ABNORMAL LOW (ref 3.5–5.1)
Sodium: 133 mEq/L — ABNORMAL LOW (ref 135–145)
Total Protein: 6.7 g/dL (ref 6.0–8.3)

## 2013-08-13 LAB — PREPARE RBC (CROSSMATCH)

## 2013-08-13 LAB — URINE MICROSCOPIC-ADD ON

## 2013-08-13 LAB — TYPE AND SCREEN: Unit division: 0

## 2013-08-13 LAB — URINALYSIS, ROUTINE W REFLEX MICROSCOPIC
Glucose, UA: NEGATIVE mg/dL
Leukocytes, UA: NEGATIVE
pH: 6 (ref 5.0–8.0)

## 2013-08-13 LAB — OCCULT BLOOD, POC DEVICE: Fecal Occult Bld: NEGATIVE

## 2013-08-13 LAB — ABO/RH: ABO/RH(D): O NEG

## 2013-08-13 MED ORDER — IRBESARTAN 300 MG PO TABS
300.0000 mg | ORAL_TABLET | Freq: Every day | ORAL | Status: DC
  Administered 2013-08-14: 300 mg via ORAL
  Filled 2013-08-13: qty 1

## 2013-08-13 MED ORDER — DULOXETINE HCL 60 MG PO CPEP
60.0000 mg | ORAL_CAPSULE | Freq: Every day | ORAL | Status: DC
  Administered 2013-08-14: 60 mg via ORAL
  Filled 2013-08-13: qty 1

## 2013-08-13 MED ORDER — ACETAMINOPHEN 325 MG PO TABS
650.0000 mg | ORAL_TABLET | Freq: Once | ORAL | Status: AC
  Administered 2013-08-14: 650 mg via ORAL
  Filled 2013-08-13: qty 2

## 2013-08-13 MED ORDER — KETOROLAC TROMETHAMINE 30 MG/ML IJ SOLN
30.0000 mg | Freq: Once | INTRAMUSCULAR | Status: AC
  Administered 2013-08-13: 30 mg via INTRAVENOUS
  Filled 2013-08-13: qty 1

## 2013-08-13 MED ORDER — DIPHENHYDRAMINE HCL 25 MG PO CAPS
25.0000 mg | ORAL_CAPSULE | Freq: Once | ORAL | Status: AC
  Administered 2013-08-14: 25 mg via ORAL
  Filled 2013-08-13: qty 1

## 2013-08-13 MED ORDER — HYDROMORPHONE HCL PF 1 MG/ML IJ SOLN
1.0000 mg | Freq: Once | INTRAMUSCULAR | Status: AC
  Administered 2013-08-13: 1 mg via INTRAVENOUS
  Filled 2013-08-13: qty 1

## 2013-08-13 MED ORDER — ZOLPIDEM TARTRATE 5 MG PO TABS
5.0000 mg | ORAL_TABLET | Freq: Every evening | ORAL | Status: DC | PRN
  Administered 2013-08-13: 5 mg via ORAL
  Filled 2013-08-13: qty 1

## 2013-08-13 MED ORDER — POLYETHYLENE GLYCOL 3350 17 G PO PACK
17.0000 g | PACK | Freq: Every day | ORAL | Status: DC
  Administered 2013-08-13: 17 g via ORAL
  Filled 2013-08-13 (×2): qty 1

## 2013-08-13 MED ORDER — MEGESTROL ACETATE 40 MG PO TABS
40.0000 mg | ORAL_TABLET | Freq: Three times a day (TID) | ORAL | Status: DC
  Administered 2013-08-13 – 2013-08-14 (×2): 40 mg via ORAL
  Filled 2013-08-13 (×2): qty 1

## 2013-08-13 MED ORDER — IOHEXOL 300 MG/ML  SOLN
100.0000 mL | Freq: Once | INTRAMUSCULAR | Status: AC | PRN
  Administered 2013-08-13: 100 mL via INTRAVENOUS

## 2013-08-13 MED ORDER — IOHEXOL 300 MG/ML  SOLN
25.0000 mL | INTRAMUSCULAR | Status: AC
Start: 2013-08-13 — End: 2013-08-13
  Administered 2013-08-13: 25 mL via ORAL

## 2013-08-13 MED ORDER — ONDANSETRON HCL 4 MG/2ML IJ SOLN
4.0000 mg | Freq: Once | INTRAMUSCULAR | Status: AC
  Administered 2013-08-13: 4 mg via INTRAVENOUS
  Filled 2013-08-13: qty 2

## 2013-08-13 MED ORDER — OXYCODONE HCL 5 MG PO TABS
5.0000 mg | ORAL_TABLET | Freq: Three times a day (TID) | ORAL | Status: DC | PRN
  Administered 2013-08-13: 5 mg via ORAL
  Filled 2013-08-13: qty 1

## 2013-08-13 MED ORDER — ALBUTEROL SULFATE HFA 108 (90 BASE) MCG/ACT IN AERS
2.0000 | INHALATION_SPRAY | Freq: Four times a day (QID) | RESPIRATORY_TRACT | Status: DC | PRN
  Administered 2013-08-13: 2 via RESPIRATORY_TRACT
  Filled 2013-08-13: qty 6.7

## 2013-08-13 MED ORDER — MORPHINE SULFATE 4 MG/ML IJ SOLN
4.0000 mg | Freq: Once | INTRAMUSCULAR | Status: DC
  Filled 2013-08-13: qty 1

## 2013-08-13 MED ORDER — SODIUM CHLORIDE 0.9 % IV BOLUS (SEPSIS)
1000.0000 mL | Freq: Once | INTRAVENOUS | Status: AC
  Administered 2013-08-13: 1000 mL via INTRAVENOUS

## 2013-08-13 MED ORDER — OXYCODONE-ACETAMINOPHEN 5-325 MG PO TABS: 1.0000 | ORAL_TABLET | Freq: Three times a day (TID) | ORAL | Status: DC | PRN

## 2013-08-13 MED ORDER — GABAPENTIN 300 MG PO CAPS
600.0000 mg | ORAL_CAPSULE | Freq: Three times a day (TID) | ORAL | Status: DC
  Administered 2013-08-13 – 2013-08-14 (×2): 600 mg via ORAL
  Filled 2013-08-13 (×2): qty 2

## 2013-08-13 MED ORDER — OLMESARTAN MEDOXOMIL-HCTZ 40-12.5 MG PO TABS: 1.0000 | ORAL_TABLET | Freq: Every day | ORAL | Status: DC

## 2013-08-13 MED ORDER — HYDROCHLOROTHIAZIDE 12.5 MG PO CAPS
12.5000 mg | ORAL_CAPSULE | Freq: Every day | ORAL | Status: DC
  Administered 2013-08-14: 12.5 mg via ORAL
  Filled 2013-08-13: qty 1

## 2013-08-13 MED ORDER — PRENATAL MULTIVITAMIN CH: 1.0000 | ORAL_TABLET | Freq: Every day | ORAL | Status: DC

## 2013-08-13 MED ORDER — OXYCODONE-ACETAMINOPHEN 10-325 MG PO TABS: 1.0000 | ORAL_TABLET | Freq: Three times a day (TID) | ORAL | Status: DC | PRN

## 2013-08-13 NOTE — ED Provider Notes (Signed)
CSN: [id number]     Arrival date & time 08/13/13  1223 History   First MD Initiated Contact with Patient 08/13/13 1243     Chief Complaint  Patient presents with  . Abdominal Pain  . Emesis   (Consider location/radiation/quality/duration/timing/severity/associated sxs/prior Treatment) The history is provided by the patient. No language interpreter was used.  Joan [name] is a 46 y/o F with PMhx of HTN, asthma, IBS presenting to the ED with abdominal pain and emesis that has been ongoing since Wednesday. Patient reported that the emesis started on Wednesday morning and that she would have an episode every 2 hours - mainly of food first and then clear contents. Patient reported that she has been experiencing abdominal pain that has gotten progressively worse, described as a "burning" sensation to the right side of the abdomen that is intermittent with radiation to the right side of the back. Patient reported that after each episode of pain she has an episode of emesis. Patient reported that she feels like her abdomen is full. Patient reported that she has noticed that she has been having greasy stools over the past couple of days. Patient reported that she has been experiencing chest pain localized to the left side of the chest and middle of the chest described as a stabbing sensation that comes and goes along with a sharp pain that lasts a couple of seconds. Patient reported that there is no trend with the pain. Patient reports that she has been having irregular menstruation since August of 2014. Reported that she has had continuous bleeding since then, reported that she has been seen by her PCP Dr. Daphine Deutscher who has placed her on BCP, has been on BCP for the past 3 months - patient currently on [place] 0.4/35. Patient reported that the vaginal bleeding has gotten progressively worse - reported that it got worse yesterday, patient reported that she started to see blood clots in her vaginal bleeding.  Denied urinary issues, dysuria, travel, surgery, history of blood clots, numbness, tingling, weakness, fever, chills, melena, hematochezia. PCP Dr. Daphine Deutscher   Past Medical History  Diagnosis Date  . Asthma   . Hypertension    Past Surgical History  Procedure Laterality Date  . Tubal ligation    . Hernia repair     History reviewed. No pertinent family history. History  Substance Use Topics  . Smoking status: Current Every Day Smoker    Types: Cigarettes  . Smokeless tobacco: Not on file  . Alcohol Use: No   OB History   Grav Para Term Preterm Abortions TAB SAB Ect Mult Living                 Review of Systems  Constitutional: Negative for fever and chills.  Respiratory: Negative for chest tightness and shortness of breath.   Cardiovascular: Negative for chest pain.  Gastrointestinal: Positive for nausea, vomiting, abdominal pain and diarrhea. Negative for constipation, blood in stool and anal bleeding.  Genitourinary: Positive for vaginal bleeding. Negative for urgency, decreased urine volume and vaginal discharge.  Musculoskeletal: Negative for back pain and neck pain.  Neurological: Positive for weakness. Negative for dizziness.  All other systems reviewed and are negative.    Allergies  Other  Home Medications   Current Outpatient Rx  Name  Route  Sig  Dispense  Refill  . albuterol (PROVENTIL HFA;VENTOLIN HFA) 108 (90 BASE) MCG/ACT inhaler   Inhalation   Inhale 2 puffs into the lungs every 6 (six) hours as needed.         Marland Kitchen  DULoxetine (CYMBALTA) 60 MG capsule   Oral   Take 60 mg by mouth daily.         Marland Kitchen gabapentin (NEURONTIN) 600 MG tablet   Oral   Take 600 mg by mouth 3 (three) times daily.         Marland Kitchen olmesartan-hydrochlorothiazide (BENICAR HCT) 40-12.5 MG per tablet   Oral   Take 1 tablet by mouth daily.         Marland Kitchen oxyCODONE-acetaminophen (PERCOCET) 10-325 MG per tablet   Oral   Take 1 tablet by mouth every 4 (four) hours as needed for pain.          Marland Kitchen PRESCRIPTION MEDICATION      28 day birth control. Not sure of name of medication         . promethazine (PHENERGAN) 25 MG tablet   Oral   Take 25 mg by mouth every 6 (six) hours as needed for nausea or vomiting.         Marland Kitchen zolpidem (AMBIEN) 10 MG tablet   Oral   Take 10 mg by mouth at bedtime as needed.          BP 125/68  Pulse 81  Temp(Src) 99.1 F (37.3 C) (Oral)  Resp 16  Ht 5\' 6"  (1.676 m)  Wt 192 lb 4.8 oz (87.227 kg)  BMI 31.05 kg/m2  SpO2 99%  LMP 08/13/2013 Physical Exam  Nursing note and vitals reviewed. Constitutional: She is oriented to person, place, and time. She appears well-developed and well-nourished. No distress.  HENT:  Head: Normocephalic and atraumatic.  Mouth/Throat: Oropharynx is clear and moist. No oropharyngeal exudate.  Eyes: Conjunctivae and EOM are normal. Pupils are equal, round, and reactive to light. Right eye exhibits no discharge. Left eye exhibits no discharge.  Neck: Normal range of motion. Neck supple.  Negative neck stiffness Negative nuchal rigidity  Negative cervical lymphadenopathy   Cardiovascular: Normal rate, regular rhythm and normal heart sounds.  Exam reveals no friction rub.   No murmur heard. Pulses:      Radial pulses are 2+ on the right side, and 2+ on the left side.       Dorsalis pedis pulses are 2+ on the right side, and 2+ on the left side.  Pulmonary/Chest: Effort normal and breath sounds normal. No respiratory distress. She has no wheezes. She has no rales.  Abdominal: Soft. She exhibits distension. There is tenderness. There is guarding.    Decreased bowel sounds Distention noted to the abdomen Hard upon palpation Discomfort upon palpation to right lower quadrant and right upper quadrant Negative Murphy's sign  Genitourinary: Vagina normal.  Negative swelling, erythema, inflammation, lesions, sores, growths, masses noted to the external genitalia. Negative swelling, erythema, lesions, sores,  inflammation, masses noted to the internal genitalia. Bright red blood noted in the vaginal vault - negative clots noted. Cervix negative friability noted - negative swelling, erythema, inflammation, lesions. Right sided adnexal tenderness noted, negative left sided adnexal tenderness upon exam. Exam chaperoned  Rectal exam: negative swelling, erythema, inflammation, sores, lesions, hemorrhoids noted to the anus. Negative pain upon palpation. Negative fissures noted. Negative masses palpated. Sphincter tone strong. Negative blood on glove, brown stools on glove.   Musculoskeletal: Normal range of motion.  Neurological: She is alert and oriented to person, place, and time. She exhibits normal muscle tone. Coordination normal.  Skin: Skin is warm and dry. No rash noted. She is not diaphoretic. No erythema.  Psychiatric: She has a normal mood and  affect. Her behavior is normal. Thought content normal.    ED Course  Procedures (including critical care time)  4:38 PM Patient seen and assessed by Dr. Bernell List.   5:01 PM This provider spoke with Dr. Emelda Fear, OBGYN at [facility] hospital, discussed case, presentation, history - Dr. Emelda Fear recommended that transfusion be discontinued and that patient be transferred to [facility]. Dr. Emelda Fear reported that patient will be under his care and that patient will be monitored and transfused while at [facility] - [facility].   5:11 PM This provider explained imaging, labs in great detail. This provider spoke with the patient regarding transfer to [facility]. Patient agreed to plan of care, understood, all questions answered.   Results for orders placed during the hospital encounter of 08/13/13  WET PREP, GENITAL      Result Value Range   Yeast Wet Prep HPF POC NONE SEEN  NONE SEEN   Trich, Wet Prep NONE SEEN  NONE SEEN   Clue Cells Wet Prep HPF POC NONE SEEN  NONE SEEN    WBC, Wet Prep HPF POC FEW (*) NONE SEEN  CBC WITH DIFFERENTIAL      Result Value Range   WBC 8.4  4.0 - 10.5 K/uL   RBC 2.75 (*) 3.87 - 5.11 MIL/uL   Hemoglobin 6.7 (*) 12.0 - 15.0 g/dL   HCT 16.1 (*) 09.6 - 04.5 %   MCV 77.1 (*) 78.0 - 100.0 fL   MCH 24.4 (*) 26.0 - 34.0 pg   MCHC 31.6  30.0 - 36.0 g/dL   RDW 40.9 (*) 81.1 - 91.4 %   Platelets 342  150 - 400 K/uL   Neutrophils Relative % 73  43 - 77 %   Lymphocytes Relative 21  12 - 46 %   Monocytes Relative 4  3 - 12 %   Eosinophils Relative 2  0 - 5 %   Basophils Relative 0  0 - 1 %   Neutro Abs 6.1  1.7 - 7.7 K/uL   Lymphs Abs 1.8  0.7 - 4.0 K/uL   Monocytes Absolute 0.3  0.1 - 1.0 K/uL   Eosinophils Absolute 0.2  0.0 - 0.7 K/uL   Basophils Absolute 0.0  0.0 - 0.1 K/uL   RBC Morphology POLYCHROMASIA PRESENT    COMPREHENSIVE METABOLIC PANEL      Result Value Range   Sodium 133 (*) 135 - 145 mEq/L   Potassium 2.8 (*) 3.5 - 5.1 mEq/L   Chloride 99  96 - 112 mEq/L   CO2 25  19 - 32 mEq/L   Glucose, Bld 113 (*) 70 - 99 mg/dL   BUN 9  6 - 23 mg/dL   Creatinine, Ser 7.82 (*) 0.50 - 1.10 mg/dL   Calcium 7.8 (*) 8.4 - 10.5 mg/dL   Total Protein 6.7  6.0 - 8.3 g/dL   Albumin 2.5 (*) 3.5 - 5.2 g/dL   AST 18  0 - 37 U/L   ALT 15  0 - 35 U/L   Alkaline Phosphatase 111  39 - 117 U/L   Total Bilirubin 0.2 (*) 0.3 - 1.2 mg/dL   GFR calc non Af Amer >90  >90 mL/min   GFR calc Af Amer >90  >90 mL/min  LIPASE, BLOOD      Result Value Range   Lipase 13  11 - 59 U/L  TROPONIN I      Result Value  Range   Troponin I <0.30  <0.30 ng/mL  URINALYSIS, ROUTINE W REFLEX MICROSCOPIC      Result Value Range   Color, Urine YELLOW  YELLOW   APPearance CLEAR  CLEAR   Specific Gravity, Urine 1.018  1.005 - 1.030   pH 6.0  5.0 - 8.0   Glucose, UA NEGATIVE  NEGATIVE mg/dL   Hgb urine dipstick LARGE (*) NEGATIVE   Bilirubin Urine NEGATIVE  NEGATIVE   Ketones, ur NEGATIVE  NEGATIVE mg/dL   Protein, ur NEGATIVE  NEGATIVE mg/dL   Urobilinogen,  UA 1.0  0.0 - 1.0 mg/dL   Nitrite NEGATIVE  NEGATIVE   Leukocytes, UA NEGATIVE  NEGATIVE  PREGNANCY, URINE      Result Value Range   Preg Test, Ur NEGATIVE  NEGATIVE  URINE MICROSCOPIC-ADD ON      Result Value Range   Squamous Epithelial / LPF RARE  RARE   WBC, UA 0-2  <3 WBC/hpf   RBC / HPF 21-50  <3 RBC/hpf   Bacteria, UA RARE  RARE  OCCULT BLOOD, POC DEVICE      Result Value Range   Fecal Occult Bld NEGATIVE  NEGATIVE  TYPE AND SCREEN      Result Value Range   ABO/RH(D) O NEG     Antibody Screen NEG     Sample Expiration 08/16/2013     Unit Number [id number]     Blood Component Type RED CELLS,LR     Unit division 00     Status of Unit ALLOCATED     Transfusion Status OK TO TRANSFUSE     Crossmatch Result Compatible     Unit Number [id number]     Blood Component Type RED CELLS,LR     Unit division 00     Status of Unit ALLOCATED     Transfusion Status OK TO TRANSFUSE     Crossmatch Result Compatible    ABO/RH      Result Value Range   ABO/RH(D) O NEG    PREPARE RBC (CROSSMATCH)      Result Value Range   Order Confirmation ORDER PROCESSED BY BLOOD BANK     US Transvaginal Non-ob  08/13/2013   CLINICAL DATA:  Right pelvic pain  EXAM: TRANSABDOMINAL ULTRASOUND OF PELVIS  DOPPLER ULTRASOUND OF OVARIES  TECHNIQUE: Transabdominal ultrasound examination of the pelvis was performed including evaluation of the uterus, ovaries, adnexal regions, and pelvic cul-de-sac.  Color and duplex Doppler ultrasound was utilized to evaluate blood flow to the ovaries.  COMPARISON:  None.  FINDINGS: Uterus  Measurements: 11.2 x 6.3 x 6.9 cm. There is a hypoechoic and heterogeneous mass within the lower uterine segment at the level of the cervix there is internal vascularity upon color Doppler analysis. It is difficult to determine if this is a myometrial or endometrial abnormality. It measures approximately 2.8 cm in diameter.  Endometrium  Thickness: 14 mm in thickness.  See above comments.   Right ovary  Measurements: Normal in size and appearance. Normal appearance/no adnexal mass.  Left ovary  Measurements: Normal in size in appearance. Normal appearance/no adnexal mass.  Pulsed Doppler evaluation demonstrates normal low-resistance arterial and venous waveforms in both ovaries.  Additional findings: No free fluid.  IMPRESSION: No evidence of ovarian torsion.  Nonspecific mass within the lower uterine segment as described. There is vascularity associated. It is difficult to determine if this represents an atypical fibroid or endometrial abnormality such as large polyp. Sonohysterogram may be helpful. Alternatively, MRI can be  performed.   Electronically Signed   By: Maryclare Bean M.D.   On: 08/13/2013 16:45   US Pelvis Complete  08/13/2013   CLINICAL DATA:  Right pelvic pain  EXAM: TRANSABDOMINAL ULTRASOUND OF PELVIS  DOPPLER ULTRASOUND OF OVARIES  TECHNIQUE: Transabdominal ultrasound examination of the pelvis was performed including evaluation of the uterus, ovaries, adnexal regions, and pelvic cul-de-sac.  Color and duplex Doppler ultrasound was utilized to evaluate blood flow to the ovaries.  COMPARISON:  None.  FINDINGS: Uterus  Measurements: 11.2 x 6.3 x 6.9 cm. There is a hypoechoic and heterogeneous mass within the lower uterine segment at the level of the cervix there is internal vascularity upon color Doppler analysis. It is difficult to determine if this is a myometrial or endometrial abnormality. It measures approximately 2.8 cm in diameter.  Endometrium  Thickness: 14 mm in thickness.  See above comments.  Right ovary  Measurements: Normal in size and appearance. Normal appearance/no adnexal mass.  Left ovary  Measurements: Normal in size in appearance. Normal appearance/no adnexal mass.  Pulsed Doppler evaluation demonstrates normal low-resistance arterial and venous waveforms in both ovaries.  Additional findings: No free fluid.  IMPRESSION: No evidence of ovarian torsion.  Nonspecific  mass within the lower uterine segment as described. There is vascularity associated. It is difficult to determine if this represents an atypical fibroid or endometrial abnormality such as large polyp. Sonohysterogram may be helpful. Alternatively, MRI can be performed.   Electronically Signed   By: Maryclare Bean M.D.   On: 08/13/2013 16:45   Ct Abdomen Pelvis W Contrast  08/13/2013   CLINICAL DATA:  Right-sided abdominal pain, nausea, and vomiting  EXAM: CT ABDOMEN AND PELVIS WITH CONTRAST  TECHNIQUE: Multidetector CT imaging of the abdomen and pelvis was performed using the standard protocol following bolus administration of intravenous contrast.  CONTRAST:  OMNIPAQUE IOHEXOL 300 MG/ML  SOLN  COMPARISON:  08/02/2008  FINDINGS: Evaluation of the lung bases is degraded secondary to motion artifact likely respiratory. Ill-defined areas of subpleural density projects within the periphery of the posterior aspect the right lower lobe and lateral and posterior portions of the left lower lobe. Differential considerations are areas of artifact due to motion versus subpleural regions of atelectasis versus mild infiltrates.  The liver, spleen, adrenals, pancreas, kidneys are unremarkable.  There is no evidence of bowel obstruction, enteritis, colitis nor diverticulitis. The appendix is appreciated image 56 series 2 measuring 9.66 mm in diameter. There is no evidence of inflammatory change within the pericecal fat, there is no evidence of inflammatory change within the periappendiceal, nor periappendiceal fluid or loculated fluid collections. When compared to the previous study this finding is stable. There is otherwise no evidence for abdominal or pelvic free fluid, loculated fluid collections, nor adenopathy. There is no evidence of abdominal aortic aneurysm. The celiac, SMA, IMA, portal vein, SMV are opacified.  Evaluation the pelvis demonstrates no evidence of free fluid, loculated fluid collections or pathologic  sized adenopathy. A small nonpathologic sized lymph node is appreciated within the left inguinal region measuring 8 mm in short axis 86 series 2. The patient is status post anterior abdominal wall hernia repair. A very small fat containing umbilical hernia is identified.  IMPRESSION: 1. No evidence of obstructive or inflammatory abnormalities within the abdomen or pelvis 2. Findings within the lung bases likely secondary to respiratory artifact due to motion as well as hypoventilation.   Electronically Signed   By: Salome Holmes M.D.   On: 08/13/2013  16:49   [place] Art/ven Flow Abd Pelv Doppler  08/13/2013   CLINICAL DATA:  Right pelvic pain  EXAM: TRANSABDOMINAL ULTRASOUND OF PELVIS  DOPPLER ULTRASOUND OF OVARIES  TECHNIQUE: Transabdominal ultrasound examination of the pelvis was performed including evaluation of the uterus, ovaries, adnexal regions, and pelvic cul-de-sac.  Color and duplex Doppler ultrasound was utilized to evaluate blood flow to the ovaries.  COMPARISON:  None.  FINDINGS: Uterus  Measurements: 11.2 x 6.3 x 6.9 cm. There is a hypoechoic and heterogeneous mass within the lower uterine segment at the level of the cervix there is internal vascularity upon color Doppler analysis. It is difficult to determine if this is a myometrial or endometrial abnormality. It measures approximately 2.8 cm in diameter.  Endometrium  Thickness: 14 mm in thickness.  See above comments.  Right ovary  Measurements: Normal in size and appearance. Normal appearance/no adnexal mass.  Left ovary  Measurements: Normal in size in appearance. Normal appearance/no adnexal mass.  Pulsed Doppler evaluation demonstrates normal low-resistance arterial and venous waveforms in both ovaries.  Additional findings: No free fluid.  IMPRESSION: No evidence of ovarian torsion.  Nonspecific mass within the lower uterine segment as described. There is vascularity associated. It is difficult to determine if this represents an atypical  fibroid or endometrial abnormality such as large polyp. Sonohysterogram may be helpful. Alternatively, MRI can be performed.   Electronically Signed   By: Maryclare Bean M.D.   On: 08/13/2013 16:45    Labs Review Labs Reviewed  WET PREP, GENITAL - Abnormal; Notable for the following:    WBC, Wet Prep HPF POC FEW (*)    All other components within normal limits  CBC WITH DIFFERENTIAL - Abnormal; Notable for the following:    RBC 2.75 (*)    Hemoglobin 6.7 (*)    HCT 21.2 (*)    MCV 77.1 (*)    MCH 24.4 (*)    RDW 21.0 (*)    All other components within normal limits  COMPREHENSIVE METABOLIC PANEL - Abnormal; Notable for the following:    Sodium 133 (*)    Potassium 2.8 (*)    Glucose, Bld 113 (*)    Creatinine, Ser 0.48 (*)    Calcium 7.8 (*)    Albumin 2.5 (*)    Total Bilirubin 0.2 (*)    All other components within normal limits  URINALYSIS, ROUTINE W REFLEX MICROSCOPIC - Abnormal; Notable for the following:    Hgb urine dipstick LARGE (*)    All other components within normal limits  GC/CHLAMYDIA PROBE AMP  LIPASE, BLOOD  TROPONIN I  PREGNANCY, URINE  URINE MICROSCOPIC-ADD ON  OCCULT BLOOD, POC DEVICE  TYPE AND SCREEN  ABO/RH  PREPARE RBC (CROSSMATCH)   Imaging Review US Transvaginal Non-ob  08/13/2013   CLINICAL DATA:  Right pelvic pain  EXAM: TRANSABDOMINAL ULTRASOUND OF PELVIS  DOPPLER ULTRASOUND OF OVARIES  TECHNIQUE: Transabdominal ultrasound examination of the pelvis was performed including evaluation of the uterus, ovaries, adnexal regions, and pelvic cul-de-sac.  Color and duplex Doppler ultrasound was utilized to evaluate blood flow to the ovaries.  COMPARISON:  None.  FINDINGS: Uterus  Measurements: 11.2 x 6.3 x 6.9 cm. There is a hypoechoic and heterogeneous mass within the lower uterine segment at the level of the cervix there is internal vascularity upon color Doppler analysis. It is difficult to determine if this is a myometrial or endometrial abnormality. It  measures approximately 2.8 cm in diameter.  Endometrium  Thickness: 14 mm  in thickness.  See above comments.  Right ovary  Measurements: Normal in size and appearance. Normal appearance/no adnexal mass.  Left ovary  Measurements: Normal in size in appearance. Normal appearance/no adnexal mass.  Pulsed Doppler evaluation demonstrates normal low-resistance arterial and venous waveforms in both ovaries.  Additional findings: No free fluid.  IMPRESSION: No evidence of ovarian torsion.  Nonspecific mass within the lower uterine segment as described. There is vascularity associated. It is difficult to determine if this represents an atypical fibroid or endometrial abnormality such as large polyp. Sonohysterogram may be helpful. Alternatively, MRI can be performed.   Electronically Signed   By: Maryclare Bean M.D.   On: 08/13/2013 16:45   US Pelvis Complete  08/13/2013   CLINICAL DATA:  Right pelvic pain  EXAM: TRANSABDOMINAL ULTRASOUND OF PELVIS  DOPPLER ULTRASOUND OF OVARIES  TECHNIQUE: Transabdominal ultrasound examination of the pelvis was performed including evaluation of the uterus, ovaries, adnexal regions, and pelvic cul-de-sac.  Color and duplex Doppler ultrasound was utilized to evaluate blood flow to the ovaries.  COMPARISON:  None.  FINDINGS: Uterus  Measurements: 11.2 x 6.3 x 6.9 cm. There is a hypoechoic and heterogeneous mass within the lower uterine segment at the level of the cervix there is internal vascularity upon color Doppler analysis. It is difficult to determine if this is a myometrial or endometrial abnormality. It measures approximately 2.8 cm in diameter.  Endometrium  Thickness: 14 mm in thickness.  See above comments.  Right ovary  Measurements: Normal in size and appearance. Normal appearance/no adnexal mass.  Left ovary  Measurements: Normal in size in appearance. Normal appearance/no adnexal mass.  Pulsed Doppler evaluation demonstrates normal low-resistance arterial and venous waveforms in  both ovaries.  Additional findings: No free fluid.  IMPRESSION: No evidence of ovarian torsion.  Nonspecific mass within the lower uterine segment as described. There is vascularity associated. It is difficult to determine if this represents an atypical fibroid or endometrial abnormality such as large polyp. Sonohysterogram may be helpful. Alternatively, MRI can be performed.   Electronically Signed   By: Maryclare Bean M.D.   On: 08/13/2013 16:45   Ct Abdomen Pelvis W Contrast  08/13/2013   CLINICAL DATA:  Right-sided abdominal pain, nausea, and vomiting  EXAM: CT ABDOMEN AND PELVIS WITH CONTRAST  TECHNIQUE: Multidetector CT imaging of the abdomen and pelvis was performed using the standard protocol following bolus administration of intravenous contrast.  CONTRAST:  OMNIPAQUE IOHEXOL 300 MG/ML  SOLN  COMPARISON:  08/02/2008  FINDINGS: Evaluation of the lung bases is degraded secondary to motion artifact likely respiratory. Ill-defined areas of subpleural density projects within the periphery of the posterior aspect the right lower lobe and lateral and posterior portions of the left lower lobe. Differential considerations are areas of artifact due to motion versus subpleural regions of atelectasis versus mild infiltrates.  The liver, spleen, adrenals, pancreas, kidneys are unremarkable.  There is no evidence of bowel obstruction, enteritis, colitis nor diverticulitis. The appendix is appreciated image 56 series 2 measuring 9.66 mm in diameter. There is no evidence of inflammatory change within the pericecal fat, there is no evidence of inflammatory change within the periappendiceal, nor periappendiceal fluid or loculated fluid collections. When compared to the previous study this finding is stable. There is otherwise no evidence for abdominal or pelvic free fluid, loculated fluid collections, nor adenopathy. There is no evidence of abdominal aortic aneurysm. The celiac, SMA, IMA, portal vein, SMV are opacified.   Evaluation the pelvis demonstrates  no evidence of free fluid, loculated fluid collections or pathologic sized adenopathy. A small nonpathologic sized lymph node is appreciated within the left inguinal region measuring 8 mm in short axis 86 series 2. The patient is status post anterior abdominal wall hernia repair. A very small fat containing umbilical hernia is identified.  IMPRESSION: 1. No evidence of obstructive or inflammatory abnormalities within the abdomen or pelvis 2. Findings within the lung bases likely secondary to respiratory artifact due to motion as well as hypoventilation.   Electronically Signed   By: Salome Holmes M.D.   On: 08/13/2013 16:49   [place] Art/ven Flow Abd Pelv Doppler  08/13/2013   CLINICAL DATA:  Right pelvic pain  EXAM: TRANSABDOMINAL ULTRASOUND OF PELVIS  DOPPLER ULTRASOUND OF OVARIES  TECHNIQUE: Transabdominal ultrasound examination of the pelvis was performed including evaluation of the uterus, ovaries, adnexal regions, and pelvic cul-de-sac.  Color and duplex Doppler ultrasound was utilized to evaluate blood flow to the ovaries.  COMPARISON:  None.  FINDINGS: Uterus  Measurements: 11.2 x 6.3 x 6.9 cm. There is a hypoechoic and heterogeneous mass within the lower uterine segment at the level of the cervix there is internal vascularity upon color Doppler analysis. It is difficult to determine if this is a myometrial or endometrial abnormality. It measures approximately 2.8 cm in diameter.  Endometrium  Thickness: 14 mm in thickness.  See above comments.  Right ovary  Measurements: Normal in size and appearance. Normal appearance/no adnexal mass.  Left ovary  Measurements: Normal in size in appearance. Normal appearance/no adnexal mass.  Pulsed Doppler evaluation demonstrates normal low-resistance arterial and venous waveforms in both ovaries.  Additional findings: No free fluid.  IMPRESSION: No evidence of ovarian torsion.  Nonspecific mass within the lower uterine segment as  described. There is vascularity associated. It is difficult to determine if this represents an atypical fibroid or endometrial abnormality such as large polyp. Sonohysterogram may be helpful. Alternatively, MRI can be performed.   Electronically Signed   By: Maryclare Bean M.D.   On: 08/13/2013 16:45    EKG Interpretation    Date/Time:  Friday August 13 2013 13:07:26 EST Ventricular Rate:  87 PR Interval:  136 QRS Duration: 88 QT Interval:  369 QTC Calculation: 444 R Axis:   70 Text Interpretation:  Sinus rhythm Since last tracing Anterolateral infarct T wave abnormality has resolved Confirmed by Effie Shy  MD, ELLIOTT ([id number]) on 08/13/2013 4:44:50 PM            MDM   1. Vaginal bleeding   2. Abdominal pain    Medications  iohexol (OMNIPAQUE) 300 MG/ML solution 25 mL (25 mLs Oral Contrast Given 08/13/13 1511)  ondansetron (ZOFRAN) injection 4 mg (4 mg Intravenous Given 08/13/13 1504)  ketorolac (TORADOL) 30 MG/ML injection 30 mg (30 mg Intravenous Given 08/13/13 1531)  iohexol (OMNIPAQUE) 300 MG/ML solution 100 mL (100 mLs Intravenous Contrast Given 08/13/13 1623)  sodium chloride 0.9 % bolus 1,000 mL (1,000 mLs Intravenous Transfusing/Transfer 08/13/13 1851)  HYDROmorphone (DILAUDID) injection 1 mg (1 mg Intravenous Given 08/13/13 1811)  acetaminophen (TYLENOL) tablet 650 mg (650 mg Oral Given 08/14/13 0015)  diphenhydrAMINE (BENADRYL) capsule 25 mg (25 mg Oral Given 08/14/13 0015)   Filed Vitals:   08/14/13 0412 08/14/13 0423 08/14/13 0522 08/14/13 0539  BP: 102/61 108/51 105/60 117/55  Pulse: 77 84 75 79  Temp: 97.9 F (36.6 C) 98.4 F (36.9 C) 98.1 F (36.7 C) 98.3 F (36.8 C)  TempSrc: Oral Oral Oral Oral  Resp: 16 16 18 16   Height:      Weight:      SpO2: 100% 99% 99% 98%    Patient presenting to the ED with abdominal pain and emesis that has been ongoing since Wednesday. Patient has been having vaginal bleeding, menorrhagia, since August - patient has been seen by  her PCP where she has been started on BCP for the past 3 months - patient reported that she has noticed some decrease in bleeding flow, but continues to have vaginal bleeding - reported that she changes her pad at least 4-5 times per day. Alert and oriented. Heart rate and rhythm normal. Lungs clear to auscultation to upper and lower lobes. Pulses palpable and strong, radial and DP bilaterally. Abdominal distension noted. Decreased bowel sounds in upper quadrants. Discomfort upon palpation to the abdomen in all four quadrants. Negative murphy's sign. Most discomfort noted to the RLQ. Pelvic exam noted bright red blood in the vaginal vault. Rectal exam negative for blood on glove, brown stool on glove.  EKG negative ischemic findings noted. Negative elevation in troponin noted. CBC noted drop in Hgb to be 6.7 - when compared to 11.9 identified on 03/04/2012 - no recent levels for better comparison. CMP noted hyponatremia (133) and hypokalemia (2.8), hypocalcemia (7.8). Lipase negative elevation. Urine pregnancy negative. Urinalysis noted large Hgb in urine - negative signs of infection. Pelvic US negative for ovarian torsion. Nonspecific mass within the lower uterine segment - hypoechoic and heterogeneous mass within the lower uterine segment, difficult to specify while myometrial versus endometrial, measures approximately 2.8 cm.  Suspicion of drop in Hgb secondary to vaginal bleeding since August 2014 - BCP that patient is on is not continuing issue. Negative acute processes noted on CT abdomen and pelvis. Discussed case with Dr. Bernell List who recommended that GYN be consulted. Spoke with Dr. Emelda Fear - GYN - who recommended that transfusion be discontinued at [facility] and that patient be transferred to [facility] where transfusion can occur and patient can be monitored regarding continuous vaginal bleeding. Discussed with patient plan for transfer to [facility] patient agreed to plan and understood. Patient stable  for transfer.   Raymon Mutton, PA-C 08/14/13 1843

## 2013-08-13 NOTE — H&P (Signed)
Joan [name] is an 47 y.o. female. 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 She is on disabilityfor these headaches Pertinent Gynecological History: Menses: flow is moderate and regular every month with spotting approximately 30 days per month and has bled daily x 3months Bleeding: dysfunctional uterine bleeding Contraception: tubal DES exposure: unknown Blood transfusions: none Sexually transmitted diseases: no past history Previous GYN Procedures:   Last mammogram:  Date:  Last pap: normal Date: allegedly this year, records not in [facility] History: G4, P4004   Menstrual History: Menarche age:  Patient's last menstrual period was 05/02/2013. until now    Past Medical History  Diagnosis Date  . Asthma   . Hypertension   . Collagen vascular disease     migraine    Past Surgical History  Procedure Laterality Date  . Tubal ligation    . Hernia repair      Family History  Problem Relation Age of Onset  . Diabetes Sister   . Hypertension Sister   . Heart disease Sister     Social History:  reports that she has been smoking Cigarettes.  She has been smoking about 1.00 pack per day. She does not have any smokeless tobacco history on file. She reports that she does not drink alcohol or use illicit drugs.  Allergies:  Allergies  Allergen Reactions  . Other     IV meds for migraine    Prescriptions prior to admission  Medication Sig Dispense Refill  . albuterol (PROVENTIL HFA;VENTOLIN HFA) 108 (90 BASE) MCG/ACT inhaler Inhale 2 puffs into the lungs every 6 (six) hours as needed.      . DULoxetine (CYMBALTA) 60 MG capsule Take 60 mg by mouth  daily.      Marland Kitchen gabapentin (NEURONTIN) 600 MG tablet Take 600 mg by mouth 3 (three) times daily.      Marland Kitchen olmesartan-hydrochlorothiazide (BENICAR HCT) 40-12.5 MG per tablet Take 1 tablet by mouth daily.      Marland Kitchen oxyCODONE-acetaminophen (PERCOCET) 10-325 MG per tablet Take 1 tablet by mouth every 4 (four) hours as needed for pain.      Marland Kitchen PRESCRIPTION MEDICATION 28 day birth control. Not sure of name of medication      . promethazine (PHENERGAN) 25 MG tablet Take 25 mg by mouth every 6 (six) hours as needed for nausea or vomiting.      Marland Kitchen zolpidem (AMBIEN) 10 MG tablet Take 10 mg by mouth at bedtime as needed.        ROS  Blood pressure 146/78, pulse 77, temperature 98.5 F (36.9 C), temperature source Oral, resp. rate 20, height 5\' 6"  (1.676 m), weight 86.32 kg (190 lb 4.8 oz), last menstrual period 05/02/2013, SpO2 95.00%. Physical Exam  Constitutional: She appears well-developed and well-nourished.  HENT:  Head: Normocephalic and atraumatic.  Eyes: Pupils are equal, round, and reactive to light.  Neck: Neck supple.  Cardiovascular: Normal rate and regular rhythm.   Respiratory: Effort normal and breath sounds normal.  GI: Soft. Bowel sounds are normal. She exhibits distension. She exhibits no mass. There is tenderness. There is no rebound and no guarding.  Diffusely tender right side of abdomen, chronic  Genitourinary: Vagina normal.  Uterus anteflexed, slight enlarged 8 wk size, thick lower ut segment , firm, cervix closed without visible lesion. The mass does NOT protrude nor is palpable Light menstrual blood without clots or purulence, no malodor    Results for orders placed during the hospital encounter of 08/13/13 (from the past 24 hour(s))  URINALYSIS, ROUTINE W REFLEX MICROSCOPIC     Status: Abnormal   Collection Time    08/13/13  1:20 PM      Result Value Range   Color, Urine YELLOW  YELLOW   APPearance CLEAR  CLEAR   Specific Gravity, Urine 1.018  1.005 - 1.030   pH 6.0  5.0 -  8.0   Glucose, UA NEGATIVE  NEGATIVE mg/dL   Hgb urine dipstick LARGE (*) NEGATIVE   Bilirubin Urine NEGATIVE  NEGATIVE   Ketones, ur NEGATIVE  NEGATIVE mg/dL   Protein, ur NEGATIVE  NEGATIVE mg/dL   Urobilinogen, UA 1.0  0.0 - 1.0 mg/dL   Nitrite NEGATIVE  NEGATIVE   Leukocytes, UA NEGATIVE  NEGATIVE  URINE MICROSCOPIC-ADD ON     Status: None   Collection Time    08/13/13  1:20 PM      Result Value Range   Squamous Epithelial / LPF RARE  RARE   WBC, UA 0-2  <3 WBC/hpf   RBC / HPF 21-50  <3 RBC/hpf   Bacteria, UA RARE  RARE  PREGNANCY, URINE     Status: None   Collection Time    08/13/13  1:21 PM      Result Value Range   Preg Test, Ur NEGATIVE  NEGATIVE  CBC WITH DIFFERENTIAL     Status: Abnormal   Collection Time    08/13/13  1:53 PM      Result Value Range   WBC 8.4  4.0 - 10.5 K/uL   RBC 2.75 (*) 3.87 - 5.11 MIL/uL   Hemoglobin 6.7 (*) 12.0 - 15.0 g/dL   HCT 62.1 (*) 30.8 - 65.7 %   MCV 77.1 (*) 78.0 - 100.0 fL   MCH 24.4 (*) 26.0 - 34.0 pg   MCHC 31.6  30.0 - 36.0 g/dL   RDW 84.6 (*) 96.2 - 95.2 %   Platelets 342  150 - 400 K/uL   Neutrophils Relative % 73  43 - 77 %   Lymphocytes Relative 21  12 - 46 %   Monocytes Relative 4  3 - 12 %   Eosinophils Relative 2  0 - 5 %   Basophils Relative 0  0 - 1 %   Neutro Abs 6.1  1.7 - 7.7 K/uL   Lymphs Abs 1.8  0.7 - 4.0 K/uL   Monocytes Absolute 0.3  0.1 - 1.0 K/uL   Eosinophils Absolute 0.2  0.0 - 0.7 K/uL   Basophils Absolute 0.0  0.0 - 0.1 K/uL   RBC Morphology POLYCHROMASIA PRESENT    COMPREHENSIVE METABOLIC PANEL     Status: Abnormal   Collection Time    08/13/13  1:53 PM      Result Value Range   Sodium 133 (*) 135 - 145 mEq/L   Potassium 2.8 (*) 3.5 - 5.1 mEq/L   Chloride 99  96 - 112 mEq/L   CO2 25  19 - 32 mEq/L   Glucose, Bld 113 (*) 70 - 99 mg/dL   BUN 9  6 - 23 mg/dL   Creatinine, Ser 8.41 (*) 0.50 - 1.10 mg/dL   Calcium 7.8 (*) 8.4 -  10.5 mg/dL   Total Protein 6.7  6.0 - 8.3 g/dL   Albumin 2.5 (*)  3.5 - 5.2 g/dL   AST 18  0 - 37 U/L   ALT 15  0 - 35 U/L   Alkaline Phosphatase 111  39 - 117 U/L   Total Bilirubin 0.2 (*) 0.3 - 1.2 mg/dL   GFR calc non Af Amer >90  >90 mL/min   GFR calc Af Amer >90  >90 mL/min  LIPASE, BLOOD     Status: None   Collection Time    08/13/13  1:53 PM      Result Value Range   Lipase 13  11 - 59 U/L  TROPONIN I     Status: None   Collection Time    08/13/13  1:55 PM      Result Value Range   Troponin I <0.30  <0.30 ng/mL  TYPE AND SCREEN     Status: None   Collection Time    08/13/13  2:30 PM      Result Value Range   ABO/RH(D) O NEG     Antibody Screen NEG     Sample Expiration 08/16/2013     Unit Number [id number]     Blood Component Type RED CELLS,LR     Unit division 00     Status of Unit REL FROM [facility]     Transfusion Status OK TO TRANSFUSE     Crossmatch Result Compatible     Unit Number [id number]     Blood Component Type RED CELLS,LR     Unit division 00     Status of Unit REL FROM [facility]     Transfusion Status OK TO TRANSFUSE     Crossmatch Result Compatible    ABO/RH     Status: None   Collection Time    08/13/13  2:30 PM      Result Value Range   ABO/RH(D) O NEG    WET PREP, GENITAL     Status: Abnormal   Collection Time    08/13/13  3:02 PM      Result Value Range   Yeast Wet Prep HPF POC NONE SEEN  NONE SEEN   Trich, Wet Prep NONE SEEN  NONE SEEN   Clue Cells Wet Prep HPF POC NONE SEEN  NONE SEEN   WBC, Wet Prep HPF POC FEW (*) NONE SEEN  OCCULT BLOOD, POC DEVICE     Status: None   Collection Time    08/13/13  3:12 PM      Result Value Range   Fecal Occult Bld NEGATIVE  NEGATIVE  PREPARE RBC (CROSSMATCH)     Status: None   Collection Time    08/13/13  4:58 PM      Result Value Range   Order Confirmation ORDER PROCESSED BY BLOOD BANK      US Transvaginal Non-ob  08/13/2013   CLINICAL DATA:  Right pelvic pain  EXAM: TRANSABDOMINAL ULTRASOUND OF PELVIS  DOPPLER ULTRASOUND OF OVARIES  TECHNIQUE:  Transabdominal ultrasound examination of the pelvis was performed including evaluation of the uterus, ovaries, adnexal regions, and pelvic cul-de-sac.  Color and duplex Doppler ultrasound was utilized to evaluate blood flow to the ovaries.  COMPARISON:  None.  FINDINGS: Uterus  Measurements: 11.2 x 6.3 x 6.9 cm. There is a hypoechoic and heterogeneous mass within the lower uterine segment at the level of the cervix there is internal vascularity upon color Doppler analysis. It is difficult to determine if  this is a myometrial or endometrial abnormality. It measures approximately 2.8 cm in diameter.  Endometrium  Thickness: 14 mm in thickness.  See above comments.  Right ovary  Measurements: Normal in size and appearance. Normal appearance/no adnexal mass.  Left ovary  Measurements: Normal in size in appearance. Normal appearance/no adnexal mass.  Pulsed Doppler evaluation demonstrates normal low-resistance arterial and venous waveforms in both ovaries.  Additional findings: No free fluid.  IMPRESSION: No evidence of ovarian torsion.  Nonspecific mass within the lower uterine segment as described. There is vascularity associated. It is difficult to determine if this represents an atypical fibroid or endometrial abnormality such as large polyp. Sonohysterogram may be helpful. Alternatively, MRI can be performed.   Electronically Signed   By: Maryclare Bean M.D.   On: 08/13/2013 16:45   US Pelvis Complete  08/13/2013   CLINICAL DATA:  Right pelvic pain  EXAM: TRANSABDOMINAL ULTRASOUND OF PELVIS  DOPPLER ULTRASOUND OF OVARIES  TECHNIQUE: Transabdominal ultrasound examination of the pelvis was performed including evaluation of the uterus, ovaries, adnexal regions, and pelvic cul-de-sac.  Color and duplex Doppler ultrasound was utilized to evaluate blood flow to the ovaries.  COMPARISON:  None.  FINDINGS: Uterus  Measurements: 11.2 x 6.3 x 6.9 cm. There is a hypoechoic and heterogeneous mass within the lower uterine segment  at the level of the cervix there is internal vascularity upon color Doppler analysis. It is difficult to determine if this is a myometrial or endometrial abnormality. It measures approximately 2.8 cm in diameter.  Endometrium  Thickness: 14 mm in thickness.  See above comments.  Right ovary  Measurements: Normal in size and appearance. Normal appearance/no adnexal mass.  Left ovary  Measurements: Normal in size in appearance. Normal appearance/no adnexal mass.  Pulsed Doppler evaluation demonstrates normal low-resistance arterial and venous waveforms in both ovaries.  Additional findings: No free fluid.  IMPRESSION: No evidence of ovarian torsion.  Nonspecific mass within the lower uterine segment as described. There is vascularity associated. It is difficult to determine if this represents an atypical fibroid or endometrial abnormality such as large polyp. Sonohysterogram may be helpful. Alternatively, MRI can be performed.   Electronically Signed   By: Maryclare Bean M.D.   On: 08/13/2013 16:45   Ct Abdomen Pelvis W Contrast  08/13/2013   CLINICAL DATA:  Right-sided abdominal pain, nausea, and vomiting  EXAM: CT ABDOMEN AND PELVIS WITH CONTRAST  TECHNIQUE: Multidetector CT imaging of the abdomen and pelvis was performed using the standard protocol following bolus administration of intravenous contrast.  CONTRAST:  OMNIPAQUE IOHEXOL 300 MG/ML  SOLN  COMPARISON:  08/02/2008  FINDINGS: Evaluation of the lung bases is degraded secondary to motion artifact likely respiratory. Ill-defined areas of subpleural density projects within the periphery of the posterior aspect the right lower lobe and lateral and posterior portions of the left lower lobe. Differential considerations are areas of artifact due to motion versus subpleural regions of atelectasis versus mild infiltrates.  The liver, spleen, adrenals, pancreas, kidneys are unremarkable.  There is no evidence of bowel obstruction, enteritis, colitis nor  diverticulitis. The appendix is appreciated image 56 series 2 measuring 9.66 mm in diameter. There is no evidence of inflammatory change within the pericecal fat, there is no evidence of inflammatory change within the periappendiceal, nor periappendiceal fluid or loculated fluid collections. When compared to the previous study this finding is stable. There is otherwise no evidence for abdominal or pelvic free fluid, loculated fluid collections, nor adenopathy. There is  no evidence of abdominal aortic aneurysm. The celiac, SMA, IMA, portal vein, SMV are opacified.  Evaluation the pelvis demonstrates no evidence of free fluid, loculated fluid collections or pathologic sized adenopathy. A small nonpathologic sized lymph node is appreciated within the left inguinal region measuring 8 mm in short axis 86 series 2. The patient is status post anterior abdominal wall hernia repair. A very small fat containing umbilical hernia is identified.  IMPRESSION: 1. No evidence of obstructive or inflammatory abnormalities within the abdomen or pelvis 2. Findings within the lung bases likely secondary to respiratory artifact due to motion as well as hypoventilation.   Electronically Signed   By: Salome Holmes M.D.   On: 08/13/2013 16:49   [place] Art/ven Flow Abd Pelv Doppler  08/13/2013   CLINICAL DATA:  Right pelvic pain  EXAM: TRANSABDOMINAL ULTRASOUND OF PELVIS  DOPPLER ULTRASOUND OF OVARIES  TECHNIQUE: Transabdominal ultrasound examination of the pelvis was performed including evaluation of the uterus, ovaries, adnexal regions, and pelvic cul-de-sac.  Color and duplex Doppler ultrasound was utilized to evaluate blood flow to the ovaries.  COMPARISON:  None.  FINDINGS: Uterus  Measurements: 11.2 x 6.3 x 6.9 cm. There is a hypoechoic and heterogeneous mass within the lower uterine segment at the level of the cervix there is internal vascularity upon color Doppler analysis. It is difficult to determine if this is a myometrial or  endometrial abnormality. It measures approximately 2.8 cm in diameter.  Endometrium  Thickness: 14 mm in thickness.  See above comments.  Right ovary  Measurements: Normal in size and appearance. Normal appearance/no adnexal mass.  Left ovary  Measurements: Normal in size in appearance. Normal appearance/no adnexal mass.  Pulsed Doppler evaluation demonstrates normal low-resistance arterial and venous waveforms in both ovaries.  Additional findings: No free fluid.  IMPRESSION: No evidence of ovarian torsion.  Nonspecific mass within the lower uterine segment as described. There is vascularity associated. It is difficult to determine if this represents an atypical fibroid or endometrial abnormality such as large polyp. Sonohysterogram may be helpful. Alternatively, MRI can be performed.   Electronically Signed   By: Maryclare Bean M.D.   On: 08/13/2013 16:45    Assessment/Plan: Abnormal uterine bleeding  X 3 months, with resultant anemia Lower uterine segment mass, fibroid vs polyp IBS-C Disability due (?) to headaches Plan: transvag u/s to better delineate mass Transfuse 3 units prbc Megace 40 tid Stabilize pt , who has appt Dr Tenny Craw as new pt Dec 12  Cypress Hinkson V 08/13/2013, 9:47 PM

## 2013-08-13 NOTE — ED Notes (Signed)
Patient transported to Ultrasound 

## 2013-08-13 NOTE — ED Notes (Signed)
Pelvic cart at bedside. 

## 2013-08-13 NOTE — Progress Notes (Signed)
Transvaginal u/s completed:  Pt has a partially prolapsing fibroid or endometrial polyp , with what appears to be a small stalk from posterior uterine surfaces, based on TVu/s power doppler. PLAN: Will complete transfusion x 3 units, try to arrange hysteroscopy for the a.m. Or early next wk.

## 2013-08-13 NOTE — ED Notes (Signed)
Advised MMagnus Ivan -PA about Hgb critical.

## 2013-08-13 NOTE — ED Provider Notes (Signed)
  Face-to-face evaluation   History:  Abdominal pain with ongoing metromenorrhagia. She's been evaluated by her PCP, and referred to a gynecologist. She presents for evaluation of tiredness pelvic pain, N/V and general achiness.   Physical exam: Alert, anxious. Abdomen is diffusely tender and somewhat distended.     Medical screening examination/treatment/procedure(s) were conducted as a shared visit with non-physician practitioner(s) and myself.  I personally evaluated the patient during the encounter  Flint Melter, MD 08/19/13 [phone]

## 2013-08-13 NOTE — ED Notes (Signed)
Pt c/o mid to lower right side abd pain with N/V x 3 days

## 2013-08-14 LAB — CBC
MCH: 25.4 pg — ABNORMAL LOW (ref 26.0–34.0)
MCHC: 32 g/dL (ref 30.0–36.0)
MCV: 79.5 fL (ref 78.0–100.0)
Platelets: 316 10*3/uL (ref 150–400)
RBC: 3.66 MIL/uL — ABNORMAL LOW (ref 3.87–5.11)
RDW: 19.2 % — ABNORMAL HIGH (ref 11.5–15.5)

## 2013-08-14 LAB — BASIC METABOLIC PANEL
CO2: 24 mEq/L (ref 19–32)
Calcium: 7.8 mg/dL — ABNORMAL LOW (ref 8.4–10.5)
Creatinine, Ser: 0.53 mg/dL (ref 0.50–1.10)
GFR calc Af Amer: >90 mL/min (ref >90)
GFR calc non Af Amer: >90 mL/min (ref >90)
Sodium: 139 mEq/L (ref 135–145)

## 2013-08-14 LAB — GC/CHLAMYDIA PROBE AMP: GC Probe RNA: NEGATIVE

## 2013-08-14 MED ORDER — SODIUM CHLORIDE 0.9 % IV SOLN
INTRAVENOUS | Status: DC
  Administered 2013-08-14: 06:00:00 via INTRAVENOUS
  Filled 2013-08-14 (×2): qty 1000

## 2013-08-14 MED ORDER — ALBUTEROL SULFATE HFA 108 (90 BASE) MCG/ACT IN AERS
2.0000 | INHALATION_SPRAY | RESPIRATORY_TRACT | Status: DC | PRN
  Administered 2013-08-14: 2 via RESPIRATORY_TRACT

## 2013-08-14 MED ORDER — ALBUTEROL SULFATE HFA 108 (90 BASE) MCG/ACT IN AERS: 2.0000 | INHALATION_SPRAY | RESPIRATORY_TRACT | Status: DC

## 2013-08-14 MED ORDER — SODIUM CHLORIDE 0.9 % IV SOLN
INTRAVENOUS | Status: DC
  Administered 2013-08-14: 250 mL via INTRAVENOUS
  Administered 2013-08-14: 06:00:00 via INTRAVENOUS
  Administered 2013-08-14: 500 mL via INTRAVENOUS

## 2013-08-14 MED ORDER — POTASSIUM CHLORIDE IN NACL 40-0.9 MEQ/L-% IV SOLN
INTRAVENOUS | Status: DC
  Filled 2013-08-14 (×2): qty 1000

## 2013-08-14 MED ORDER — MEGESTROL ACETATE 40 MG PO TABS: 40.0000 mg | ORAL_TABLET | Freq: Three times a day (TID) | ORAL | Status: DC

## 2013-08-14 MED ORDER — HYDROMORPHONE HCL PF 1 MG/ML IJ SOLN
1.0000 mg | INTRAMUSCULAR | Status: DC | PRN
  Administered 2013-08-14: 2 mg via INTRAVENOUS
  Administered 2013-08-14: 1 mg via INTRAVENOUS
  Filled 2013-08-14: qty 2
  Filled 2013-08-14: qty 1

## 2013-08-14 NOTE — Progress Notes (Signed)
Discharge instructions provided to patient and significant other at bedside.  Medications, when to call the doctor, community resources, activity and follow up appointments dicussed.  No questions at this time.  Patient left unit in stable condition with all personal belongings and prescriptions accompanied by staff.  Osvaldo Angst, RN-----------

## 2013-08-14 NOTE — Progress Notes (Addendum)
Patient instructed to call for assistance out of bed.  Found patient in bathroom.  Patient was instructed several times to call for assist because she does have a history of falling in past 6 months.  Patient stated "I know".

## 2013-08-14 NOTE — Progress Notes (Signed)
Hosp day 2, s/p transfusion 3 units prbc for bleeding associated with prolapsing endometrial polyp or fibroid Subjective: Patient resting quietly at present, has just completed transfusion 3 units prbc. No clots passed this a.m.  She did receive iv analgesic for headache midnight. .  CBC , bmp  Ordered for 7 am Pt currently NPO  Objective: I have reviewed patient's vital signs, medications and labs.Admission K+ is 2.8, will recheck this a.m. After transfusions, and have begun NS with 40 mEq/l at 125/hr  General: resting comfortably at present GI: soft, non-tender; bowel sounds normal; no masses,  no organomegaly Vaginal Bleeding: minimal   Assessment/Plan:  s/p transfusion 3 units prbc for bleeding associated with prolapsing endometrial polyp or fibroid  Plan : NPO til results  7  Am labs reviewed,   LOS: 1 day    Joan [name] V 08/14/2013, 6:07 AM

## 2013-08-14 NOTE — Discharge Summary (Signed)
Physician Discharge Summary  Patient ID: Joan [name] MRN: [id number] DOB/AGE: June 20, 1966 47 y.o.  Admit date: 08/13/2013 Discharge date: 08/14/2013  Admission Diagnoses:Abnormal Uterine bleeding, Anemia, Cervical Mass, IBS, abdominal pain,chronic, headaches  Discharge Diagnoses: same, Cervical mass is endometrial polyp, will need removal by hysteroscopy Active Problems:   Anemia   Discharged Condition: stable  Hospital Course: Admitted for transfusion, and assessment, received 3 units prbc, with improved anemia, and started on Megace. Pelvic u/s shows a endometrial polyp prolapsing thru the lower uterine segment, which will require excision surgically  Consults: None  Significant Diagnostic Studies: labs:  CBC    Component Value Date/Time   WBC 9.3 08/14/2013 0723   RBC 3.66* 08/14/2013 0723   HGB 9.3* 08/14/2013 0723   HCT 29.1* 08/14/2013 0723   PLT 316 08/14/2013 0723   MCV 79.5 08/14/2013 0723   MCH 25.4* 08/14/2013 0723   MCHC 32.0 08/14/2013 0723   RDW 19.2* 08/14/2013 0723   LYMPHSABS 1.8 08/13/2013 1353   MONOABS 0.3 08/13/2013 1353   EOSABS 0.2 08/13/2013 1353   BASOSABS 0.0 08/13/2013 1353       Treatments: therapies: transfusion 3 u prbc   Discharge Exam: Blood pressure 117/55, pulse 79, temperature 98.3 F (36.8 C), temperature source Oral, resp. rate 16, height 5\' 6"  (1.676 m), weight 86.32 kg (190 lb 4.8 oz), last menstrual period 05/02/2013, SpO2 98.00%. General appearance: alert, cooperative, mild distress and with headaches Head: Normocephalic, without obvious abnormality, atraumatic GI: moderate bloating, chronic Pelvic: cervix normal in appearance, exam obscured by obesity, no adnexal masses or tenderness and lower ut segment polyp or fibroid, nearly down to cervix, u/s shows the polypy stalk.  Disposition: 01-Home or Self Care  Discharge Orders   Future Orders Complete By Expires   Diet - low sodium heart healthy  As directed    Discharge instructions  As directed    Comments:     Resume all outpatient meds,  Followup my office [facility] Monday Afternoon 1:30 pm for preop scheduling   Increase activity slowly  As directed    Sexual Activity Restrictions  As directed    Comments:     None til surgery completed       Medication List         albuterol 108 (90 BASE) MCG/ACT inhaler  Commonly known as:  PROVENTIL HFA;VENTOLIN HFA  Inhale 2 puffs into the lungs every 6 (six) hours as needed.     AMBIEN 10 MG tablet  Generic drug:  zolpidem  Take 10 mg by mouth at bedtime as needed.     DULoxetine 60 MG capsule  Commonly known as:  CYMBALTA  Take 60 mg by mouth daily.     gabapentin 600 MG tablet  Commonly known as:  NEURONTIN  Take 600 mg by mouth 3 (three) times daily.     megestrol 40 MG tablet  Commonly known as:  MEGACE  Take 1 tablet (40 mg total) by mouth 3 (three) times daily.     olmesartan-hydrochlorothiazide 40-12.5 MG per tablet  Commonly known as:  BENICAR HCT  Take 1 tablet by mouth daily.     oxyCODONE-acetaminophen 10-325 MG per tablet  Commonly known as:  PERCOCET  Take 1 tablet by mouth every 4 (four) hours as needed for pain.     PRESCRIPTION MEDICATION  28 day birth control. Not sure of name of medication     promethazine 25 MG tablet  Commonly known as:  PHENERGAN  Take  25 mg by mouth every 6 (six) hours as needed for nausea or vomiting.           Follow-up Information   Follow up with [facility]. Schedule an appointment as soon as possible for a visit in 2 days. (to be seen at 1:30 as preop appt )    Specialty:  Obstetrics and Gynecology   Contact information:   [address] [place] [address] [phone]      Signed: Tilda Burrow 08/14/2013, 8:53 AM

## 2013-08-14 NOTE — Progress Notes (Signed)
Sitting on side of bed, dozing, instructed to lay in bed, did not want to then c/o headache.

## 2013-08-14 NOTE — Progress Notes (Signed)
Utilization Review completed.  

## 2013-08-15 LAB — TYPE AND SCREEN
ABO/RH(D): O NEG
Antibody Screen: NEGATIVE
Unit division: 0

## 2013-08-16 ENCOUNTER — Ambulatory Visit (INDEPENDENT_AMBULATORY_CARE_PROVIDER_SITE_OTHER): Payer: BC Managed Care – PPO | Admitting: Obstetrics and Gynecology

## 2013-08-16 ENCOUNTER — Encounter: Payer: Self-pay | Admitting: Obstetrics and Gynecology

## 2013-08-16 ENCOUNTER — Encounter (HOSPITAL_COMMUNITY): Payer: Self-pay | Admitting: Pharmacy Technician

## 2013-08-16 ENCOUNTER — Encounter (HOSPITAL_COMMUNITY)
Admission: RE | Admit: 2013-08-16 | Discharge: 2013-08-16 | Disposition: A | Discharge status: Home / Self Care | Payer: BC Managed Care – PPO | Source: Ambulatory Visit | Attending: Obstetrics and Gynecology | Admitting: Obstetrics and Gynecology

## 2013-08-16 ENCOUNTER — Encounter (HOSPITAL_COMMUNITY): Payer: Self-pay

## 2013-08-16 ENCOUNTER — Other Ambulatory Visit: Payer: Self-pay | Admitting: Obstetrics and Gynecology

## 2013-08-16 VITALS — BP 132/82 | Ht 66.0 in | Wt 202.2 lb

## 2013-08-16 DIAGNOSIS — D649 Anemia, unspecified: Secondary | ICD-10-CM

## 2013-08-16 DIAGNOSIS — N938 Other specified abnormal uterine and vaginal bleeding: Secondary | ICD-10-CM

## 2013-08-16 DIAGNOSIS — N949 Unspecified condition associated with female genital organs and menstrual cycle: Secondary | ICD-10-CM

## 2013-08-16 DIAGNOSIS — N840 Polyp of corpus uteri: Secondary | ICD-10-CM

## 2013-08-16 DIAGNOSIS — N925 Other specified irregular menstruation: Secondary | ICD-10-CM

## 2013-08-16 HISTORY — DX: Anxiety disorder, unspecified: F41.9

## 2013-08-16 HISTORY — DX: Anemia, unspecified: D64.9

## 2013-08-16 LAB — HCG, SERUM, QUALITATIVE: Preg, Serum: NEGATIVE

## 2013-08-16 LAB — URINE MICROSCOPIC-ADD ON

## 2013-08-16 LAB — URINALYSIS, ROUTINE W REFLEX MICROSCOPIC
Bilirubin Urine: NEGATIVE
Glucose, UA: NEGATIVE mg/dL
Ketones, ur: NEGATIVE mg/dL
Nitrite: POSITIVE — AB
Specific Gravity, Urine: 1.025 (ref 1.005–1.030)
Urobilinogen, UA: 1 mg/dL (ref 0.0–1.0)

## 2013-08-16 LAB — BASIC METABOLIC PANEL
CO2: 28 mEq/L (ref 19–32)
Calcium: 9.6 mg/dL (ref 8.4–10.5)
GFR calc Af Amer: >90 mL/min (ref >90)
GFR calc non Af Amer: >90 mL/min (ref >90)
Glucose, Bld: 88 mg/dL (ref 70–99)
Potassium: 3.4 mEq/L — ABNORMAL LOW (ref 3.5–5.1)
Sodium: 139 mEq/L (ref 135–145)

## 2013-08-16 LAB — CBC
MCH: 25.8 pg — ABNORMAL LOW (ref 26.0–34.0)
MCHC: 31.8 g/dL (ref 30.0–36.0)
MCV: 81.1 fL (ref 78.0–100.0)
Platelets: 408 10*3/uL — ABNORMAL HIGH (ref 150–400)
RBC: 3.87 MIL/uL (ref 3.87–5.11)
WBC: 9.9 10*3/uL (ref 4.0–10.5)

## 2013-08-16 NOTE — Patient Instructions (Addendum)
Joan [name]  08/16/2013   Your procedure is scheduled on: 08/17/2013  Report to [facility] at  1120  AM.  Call this number if you have problems the morning of surgery: [phone]   Remember:   Do not eat food or drink liquids after midnight.   Take these medicines the morning of surgery with A SIP OF WATER: cymbalta, neurontin, benicar, percocet, phenergan   Do not wear jewelry, make-up or nail polish.  Do not wear lotions, powders, or perfumes.   Do not shave 48 hours prior to surgery. Men may shave face and neck.  Do not bring valuables to the hospital.  [facility] is not responsible for any belongings or valuables.               Contacts, dentures or bridgework may not be worn into surgery.  Leave suitcase in the car. After surgery it may be brought to your room.  For patients admitted to the hospital, discharge time is determined by your treatment team.               Patients discharged the day of surgery will not be allowed to drive home.  Name and phone number of your driver: family  Special Instructions: Shower using CHG 2 nights before surgery and the night before surgery.  If you shower the day of surgery use CHG.  Use special wash - you have one bottle of CHG for all showers.  You should use approximately 1/3 of the bottle for each shower.   Please read over the following fact sheets that you were given: Pain Booklet, Coughing and Deep Breathing, Surgical Site Infection Prevention, Anesthesia Post-op Instructions and Care and Recovery After Surgery Endometrial Ablation Endometrial ablation removes the lining of the uterus (endometrium). It is usually a same-day, outpatient treatment. Ablation helps avoid major surgery, such as surgery to remove the cervix and uterus (hysterectomy). After endometrial ablation, you will have little or no menstrual bleeding and may not be able to have children. However, if you are premenopausal, you will need to use a reliable  method of birth control following the procedure because of the small chance that pregnancy can occur. There are different reasons to have this procedure, which include:  Heavy periods.  Bleeding that is causing anemia.  Irregular bleeding.  Bleeding fibroids on the lining inside the uterus if they are smaller than 3 centimeters. This procedure should not be done if:  You want children in the future.  You have severe cramps with your menstrual period.  You have precancerous or cancerous cells in your uterus.  You were recently pregnant.  You have gone through menopause.  You have had major surgery on the uterus, such as a cesarean delivery. LET [facility] CARE PROVIDER KNOW ABOUT:  Any allergies you have.  All medicines you are taking, including vitamins, herbs, eye drops, creams, and over-the-counter medicines.  Previous problems you or members of your family have had with the use of anesthetics.  Any blood disorders you have.  Previous surgeries you have had.  Medical conditions you have. RISKS AND COMPLICATIONS  Generally, this is a safe procedure. However, as with any procedure, complications can occur. Possible complications include:  Perforation of the uterus.  Bleeding.  Infection of the uterus, bladder, or vagina.  Injury to surrounding organs.  An air bubble to the lung (air embolus).  Pregnancy following the procedure.  Failure of the procedure to help  the problem, requiring hysterectomy.  Decreased ability to diagnose cancer in the lining of the uterus. BEFORE THE PROCEDURE  The lining of the uterus must be tested to make sure there is no pre-cancerous or cancer cells present.  An ultrasound may be performed to look at the size of the uterus and to check for abnormalities.  Medicines may be given to thin the lining of the uterus. PROCEDURE  During the procedure, your health care provider will use a tool called a resectoscope to help see inside  your uterus. There are different ways to remove the lining of your uterus.   Radiofrequency  This method uses a radiofrequency-alternating electric current to remove the lining of the uterus.  Cryotherapy This method uses extreme cold to freeze the lining of the uterus.  Heated-Free Liquid  This method uses heated salt (saline) solution to remove the lining of the uterus.  Microwave This method uses high-energy microwaves to heat up the lining of the uterus to remove it.  Thermal balloon  This method involves inserting a catheter with a balloon tip into the uterus. The balloon tip is filled with heated fluid to remove the lining of the uterus. AFTER THE PROCEDURE  After your procedure, do not have sexual intercourse or insert anything into your vagina until permitted by your health care provider. After the procedure, you may experience:  Cramps.  Vaginal discharge.  Frequent urination. Document Released: 07/12/2004 Document Revised: 05/05/2013 Document Reviewed: 02/03/2013 [facility] Patient Information 2014 [place], [place]. Hysteroscopy Hysteroscopy is a procedure used for looking inside the womb (uterus). It may be done for many different reasons, including:  To evaluate abnormal bleeding, fibroid (benign, noncancerous) tumors, polyps, scar tissue (adhesions), and possibly cancer of the uterus.  To look for lumps (tumors) and other uterine growths.  To look for causes of why a woman cannot get pregnant (infertility), causes of recurrent loss of pregnancy (miscarriages), or a lost intrauterine device (IUD).  To perform a sterilization by blocking the fallopian tubes from inside the uterus. A hysteroscopy should be done right after a menstrual period to be sure you are not pregnant. LET YOUR CAREGIVER KNOW ABOUT:   Allergies.  Medicines taken, including herbs, eyedrops, over-the-counter medicines, and creams.  Use of steroids (by mouth or creams).  Previous problems with  anesthetics or numbing medicines.  History of bleeding or blood problems.  History of blood clots.  Possibility of pregnancy, if this applies.  Previous surgery.  Other health problems. RISKS AND COMPLICATIONS   Putting a hole in the uterus.  Excessive bleeding.  Infection.  Damage to the cervix.  Injury to other organs.  Allergic reaction to medicines.  Too much fluid used in the uterus for the procedure. BEFORE THE PROCEDURE   Do not take aspirin or blood thinners for a week before the procedure, or as directed. It can cause bleeding.  Arrive at least 60 minutes before the procedure or as directed to read and sign the necessary forms.  Arrange for someone to take you home after the procedure.  If you smoke, do not smoke for 2 weeks before the procedure. PROCEDURE   Your caregiver may give you medicine to relax you. He or she may also give you a medicine that numbs the area around the cervix (local anesthetic) or a medicine that makes you sleep (general anesthesia).  Sometimes, a medicine is placed in the cervix the day before the procedure. This medicine makes the cervix have a larger opening (dilate). This  makes it easier for the instrument to be inserted into the uterus.  A small instrument (hysteroscope) is inserted through the vagina into the uterus. This instrument is similar to a pencil-sized telescope with a light.  During the procedure, air or a liquid is put into the uterus, which allows the surgeon to see better.  Sometimes, tissue is gently scraped from inside the uterus. These tissue samples are sent to a specialist who looks at tissue samples (pathologist). The pathologist will give a report to your caregiver. This will help your caregiver decide if further treatment is necessary. The report will also help your caregiver decide on the best treatment if the test comes back abnormal. AFTER THE PROCEDURE   If you had a general anesthetic, you may be groggy  for a couple hours after the procedure.  If you had a local anesthetic, you will be advised to rest at the surgical center or caregiver's office until you are stable and feel ready to go home.  You may have some cramping for a couple days.  You may have bleeding, which varies from light spotting for a few days to menstrual-like bleeding for up to 3 to 7 days. This is normal.  Have someone take you home. FINDING OUT THE RESULTS OF YOUR TEST Not all test results are available during your visit. If your test results are not back during the visit, make an appointment with your caregiver to find out the results. Do not assume everything is normal if you have not heard from your caregiver or the medical facility. It is important for you to follow up on all of your test results. HOME CARE INSTRUCTIONS   Do not drive for 24 hours or as instructed.  Only take over-the-counter or prescription medicines for pain, discomfort, or fever as directed by your caregiver.  Do not take aspirin. It can cause or aggravate bleeding.  Do not drive or drink alcohol while taking pain medicine.  You may resume your usual diet.  Do not use tampons, douche, or have sexual intercourse for 2 weeks, or as advised by your caregiver.  Rest and sleep for the first 24 to 48 hours.  Take your temperature twice a day for 4 to 5 days. Write it down. Give these temperatures to your caregiver if they are abnormal (above 98.6 F or 37.0 C).  Take medicines your caregiver has ordered as directed.  Follow your caregiver's advice regarding diet, exercise, lifting, driving, and general activities.  Take showers instead of baths for 2 weeks, or as recommended by your caregiver.  If you develop constipation:  Take a mild laxative with the advice of your caregiver.  Eat bran foods.  Drink enough water and fluids to keep your urine clear or pale yellow.  Try to have someone with you or available to you for the first 24 to  48 hours, especially if you had a general anesthetic.  Make sure you and your family understand everything about your operation and recovery.  Follow your caregiver's advice regarding follow-up appointments and Pap smears. SEEK MEDICAL CARE IF:   You feel dizzy or lightheaded.  You feel sick to your stomach (nauseous).  You develop abnormal vaginal discharge.  You develop a rash.  You have an abnormal reaction or allergy to your medicine.  You need stronger pain medicine. SEEK IMMEDIATE MEDICAL CARE IF:   Bleeding is heavier than a normal menstrual period or you have blood clots.  You have an oral temperature above  102 F (38.9 C), not controlled by medicine.  You have increasing cramps or pains not relieved with medicine.  You develop belly (abdominal) pain that does not seem to be related to the same area of earlier cramping and pain.  You pass out.  You develop pain in the tops of your shoulders (shoulder strap areas).  You develop shortness of breath. MAKE SURE YOU:   Understand these instructions.  Will watch your condition.  Will get help right away if you are not doing well or get worse. Document Released: 12/09/2000 Document Revised: 11/25/2011 Document Reviewed: 04/01/2013 [facility] Patient Information 2014 [place], [place]. Dilation and Curettage or Vacuum Curettage Dilation and curettage (D&C) and vacuum curettage are minor procedures. A D&C involves stretching (dilation) the cervix and scraping (curettage) the inside lining of the womb (uterus). During a D&C, tissue is gently scraped from the inside lining of the uterus. During a vacuum curettage, the lining and tissue in the uterus are removed with the use of gentle suction.  Curettage may be performed to either diagnose or treat a problem. As a diagnostic procedure, curettage is performed to examine tissues from the uterus. A diagnostic curettage may be performed for the following symptoms:   Irregular  bleeding in the uterus.   Bleeding with the development of clots.   Spotting between menstrual periods.   Prolonged menstrual periods.   Bleeding after menopause.   No menstrual period (amenorrhea).   A change in size and shape of the uterus.  As a treatment procedure, curettage may be performed for the following reasons:   Removal of an IUD (intrauterine device).   Removal of retained placenta after giving birth. Retained placenta can cause an infection or bleeding severe enough to require transfusions.   Abortion.   Miscarriage.   Removal of polyps inside the uterus.   Removal of uncommon types of noncancerous lumps (fibroids).  LET [facility] CARE PROVIDER KNOW ABOUT:   Any allergies you have.   All medicines you are taking, including vitamins, herbs, eye drops, creams, and over-the-counter medicines.   Previous problems you or members of your family have had with the use of anesthetics.   Any blood disorders you have.   Previous surgeries you have had.   Medical conditions you have. RISKS AND COMPLICATIONS  Generally, this is a safe procedure. However, as with any procedure, complications can occur. Possible complications include:  Excessive bleeding.   Infection of the uterus.   Damage to the cervix.   Development of scar tissue (adhesions) inside the uterus, later causing abnormal amounts of menstrual bleeding.   Complications from the general anesthetic, if a general anesthetic is used.   Putting a hole (perforation) in the uterus. This is rare.  BEFORE THE PROCEDURE   Eat and drink before the procedure only as directed by your health care provider.   Arrange for someone to take you home.  PROCEDURE  This procedure usually takes about 15 30 minutes.  You will be given one of the following:  A medicine that numbs the area in and around the cervix (local anesthetic).   A medicine to make you sleep through the procedure  (general anesthetic).  You will lie on your back with your legs in stirrups.   A warm metal or plastic instrument (speculum) will be placed in your vagina to keep it open and to allow the health care provider to see the cervix.  There are two ways in which your cervix can be softened  and dilated. These include:   Taking a medicine.   Having thin rods (laminaria) inserted into your cervix.   A curved tool (curette) will be used to scrape cells from the inside lining of the uterus. In some cases, gentle suction is applied with the curette. The curette will then be removed.  AFTER THE PROCEDURE   You will rest in the recovery area until you are stable and are ready to go home.   You may feel sick to your stomach (nauseous) or throw up (vomit) if you were given a general anesthetic.   You may have a sore throat if a tube was placed in your throat during general anesthesia.   You may have light cramping and bleeding. This may last for 2 days to 2 weeks after the procedure.   Your uterus needs to make a new lining after the procedure. This may make your next period late. Document Released: 09/02/2005 Document Revised: 05/05/2013 Document Reviewed: 04/01/2013 [facility] Patient Information 2014 [place], [place].

## 2013-08-16 NOTE — Progress Notes (Signed)
Patient ID: Joan [name], female   DOB: 10-Oct-1965, 47 y.o.   MRN: [id number] Joan [name] is an 47 y.o. female. She is here for preop for hysteroscopy, dilation andcurettage, removal of endometrial polyp, and endometrial ablation. She was admitted over the weekend to [facility] hospital for continued vaginal bleeding x 3 months, with anemia, requiring transfusion x 3 units prbc. 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 The polyp has a narrow stalk as delineated by Power Doppler portion of the ultrasound.  Treatment options reviewed: and plans for surgical excision of the polyp by hysteroscopy scheduled on an urgent basis due to the significant discomfort of the polyp and its location. The patient requests that endometrial ablation be done as well, to attempt to further reduce future bleeding. This discussed, including alternatives such as delaying endo ablation until after healing from the polyp removal. The patient desires to avoid a two-surgery process. The patient is aware that pathology on the polyp or on the endometrial scrapings to be done [facility]) could produce findings that would require future further treatment, up to and including further surgeries such as hysterectomy.    Pertinent Gynecological History: Menses: irregular x 3 months Bleeding: dysfunctional uterine bleeding Contraception: tubal ligation DES exposure: unknown Blood transfusions: 3 units last week Sexually transmitted diseases: no past history Previous GYN Procedures: tubal  Last mammogram: none in epic records Date:  Last pap: reported as normal by pt from primary care Date: "last year" OB History: G4, P4004   Menstrual History: Menarche age:  Patient's last menstrual period was 05/02/2013.    Past Medical History  Diagnosis Date  . Asthma   . Hypertension   . Collagen vascular  disease     migraine  -- irritable bowel syndrome with chronic abd pain and irregular bm's.  Past Surgical History  Procedure Laterality Date  . Tubal ligation    . Hernia repair      Family History  Problem Relation Age of Onset  . Diabetes Sister   . Hypertension Sister   . Heart disease Sister   . Cancer Sister     brain tumor  . Heart disease Brother   . Cancer Maternal Aunt     uterine  . Cancer Maternal Grandmother     uterine    Social History:  reports that she has been smoking Cigarettes.  She has been smoking about 1.00 pack per day. She has never used smokeless tobacco. She reports that she does not drink alcohol or use illicit drugs.  Allergies:  Allergies  Allergen Reactions  . Other     IV meds for migraine   Study Result ultrasound 11/28    CLINICAL DATA: Cervical fibroid versus polyp.  EXAM:  TRANSVAGINAL ULTRASOUND OF PELVIS  TECHNIQUE:  Transvaginal ultrasound examination of the pelvis was performed  including evaluation of the uterus, ovaries, adnexal regions, and  pelvic cul-de-sac.  COMPARISON: Transabdominal imaging 08/13/2013  FINDINGS:  Uterus  Measurements: 11 x 7.3 x 7 cm. No myometrial masses.  Endometrium  Thickness: 11.1 mm. There is a heterogeneous solid polypoid  structure demonstrated in the endometrium which appears to prolapse  from the upper to the lower uterine segment. This mass measures  about 3.5 x 2 x 2.7 cm. The stalk is visualized. Color flow Doppler  imaging demonstrates flow within the mass. This could represent a  prolapsing fibroid or endometrial polyp.  Right ovary  Measurements: 2 x 2.6 x 1.5 cm. Normal appearance/no adnexal mass.  Left ovary  Measurements: 2 x 1.9 x 0.9 cm. Normal appearance/no adnexal mass.  Other findings: Minimal free fluid is demonstrated.  IMPRESSION:  There is a prolapsing polypoid structure in the endometrium with  flow demonstrated. This could represent a prolapsing fibroid or   endometrial polyp.  Electronically Signed  By: Burman Nieves M.D.  On: 08/13/2013 22:39     (Not in a hospital admission)  ROS  Blood pressure 132/82, height 5\' 6"  (1.676 m), weight 202 lb 3.2 oz (91.717 kg), last menstrual period 05/02/2013. Physical Exam  Constitutional: She appears well-developed and well-nourished.  HENT:  Head: Normocephalic and atraumatic.  Eyes: Pupils are equal, round, and reactive to light.  Neck: Normal range of motion. Neck supple.  Cardiovascular: Normal rate and regular rhythm.   Respiratory: Effort normal.  GI: Soft. Bowel sounds are normal.  Genitourinary: Vagina normal.  , VAGINA: normal appearing vagina with normal color and discharge, no lesions, CERVIX: normal appearing cervix without discharge or lesions, multiparous os, the hard firm polyp or fibriod is contacted 1 cm inside the os, precluding endo biopsy, UTERUS: mass enlarges lower uterine segment, anteverted, enlarged to 8 week's size, ADNEXA: normal adnexa in size, nontender and no masses     No results found for this or any previous visit (from the past 24 hour(s)).  No results found.  Assessment/Plan: Prolapsing endometrial polyp or fibroid, now occluding the lower uterine segment. DUB with anemia requiring transfusion, now corrected.  Plan: Hysteroscopic removal of prolapsing polyp/fibroid D&C Endometrial ablation: the patient is made aware that efforts to complete ablation will be done, but that the ability to complete this procedure depends on anatomy and clinical findings at end of removal of polyp Patient desires to proceed as quickly as possible for treatment. Scheduled for 08/17/13 at midday  Joan [name] V 08/16/2013, 3:03 PM

## 2013-08-17 ENCOUNTER — Ambulatory Visit (HOSPITAL_COMMUNITY)
Admission: RE | Admit: 2013-08-17 | Discharge: 2013-08-17 | Disposition: A | Discharge status: Home / Self Care | Payer: BC Managed Care – PPO | Source: Ambulatory Visit | Attending: Obstetrics and Gynecology | Admitting: Obstetrics and Gynecology

## 2013-08-17 ENCOUNTER — Ambulatory Visit (HOSPITAL_COMMUNITY): Payer: BC Managed Care – PPO | Admitting: Anesthesiology

## 2013-08-17 ENCOUNTER — Encounter (HOSPITAL_COMMUNITY): Payer: BC Managed Care – PPO | Admitting: Anesthesiology

## 2013-08-17 ENCOUNTER — Encounter (HOSPITAL_COMMUNITY)
Admission: RE | Disposition: A | Discharge status: Home / Self Care | Payer: Self-pay | Source: Ambulatory Visit | Attending: Obstetrics and Gynecology

## 2013-08-17 ENCOUNTER — Encounter (HOSPITAL_COMMUNITY): Payer: Self-pay | Admitting: *Deleted

## 2013-08-17 DIAGNOSIS — N920 Excessive and frequent menstruation with regular cycle: Secondary | ICD-10-CM

## 2013-08-17 DIAGNOSIS — I10 Essential (primary) hypertension: Secondary | ICD-10-CM | POA: Insufficient documentation

## 2013-08-17 DIAGNOSIS — N840 Polyp of corpus uteri: Secondary | ICD-10-CM

## 2013-08-17 DIAGNOSIS — D649 Anemia, unspecified: Secondary | ICD-10-CM

## 2013-08-17 DIAGNOSIS — D500 Iron deficiency anemia secondary to blood loss (chronic): Secondary | ICD-10-CM

## 2013-08-17 DIAGNOSIS — D259 Leiomyoma of uterus, unspecified: Secondary | ICD-10-CM | POA: Insufficient documentation

## 2013-08-17 HISTORY — PX: DILITATION & CURRETTAGE/HYSTROSCOPY WITH THERMACHOICE ABLATION: SHX5569

## 2013-08-17 HISTORY — PX: POLYPECTOMY: SHX5525

## 2013-08-17 SURGERY — DILATATION & CURETTAGE/HYSTEROSCOPY WITH THERMACHOICE ABLATION
Anesthesia: General | Site: Uterus

## 2013-08-17 MED ORDER — MIDAZOLAM HCL 2 MG/2ML IJ SOLN
INTRAMUSCULAR | Status: AC
  Filled 2013-08-17: qty 4

## 2013-08-17 MED ORDER — LIDOCAINE HCL (PF) 1 % IJ SOLN
INTRAMUSCULAR | Status: AC
  Filled 2013-08-17: qty 5

## 2013-08-17 MED ORDER — LACTATED RINGERS IV SOLN
INTRAVENOUS | Status: DC
  Administered 2013-08-17: 10:00:00 via INTRAVENOUS

## 2013-08-17 MED ORDER — BUPIVACAINE-EPINEPHRINE 0.5% -1:200000 IJ SOLN
INTRAMUSCULAR | Status: DC | PRN
  Administered 2013-08-17: 27 mL

## 2013-08-17 MED ORDER — PROPOFOL 10 MG/ML IV BOLUS
INTRAVENOUS | Status: DC | PRN
  Administered 2013-08-17: 150 mg via INTRAVENOUS

## 2013-08-17 MED ORDER — FENTANYL CITRATE 0.05 MG/ML IJ SOLN
INTRAMUSCULAR | Status: AC
  Filled 2013-08-17: qty 2

## 2013-08-17 MED ORDER — BUPIVACAINE-EPINEPHRINE PF 0.5-1:200000 % IJ SOLN
INTRAMUSCULAR | Status: AC
  Filled 2013-08-17: qty 10

## 2013-08-17 MED ORDER — ONDANSETRON HCL 4 MG/2ML IJ SOLN
INTRAMUSCULAR | Status: AC
  Filled 2013-08-17: qty 2

## 2013-08-17 MED ORDER — FENTANYL CITRATE 0.05 MG/ML IJ SOLN
25.0000 ug | INTRAMUSCULAR | Status: AC
  Administered 2013-08-17 (×4): 25 ug via INTRAVENOUS
  Administered 2013-08-17: 50 ug via INTRAVENOUS

## 2013-08-17 MED ORDER — KETOROLAC TROMETHAMINE 10 MG PO TABS: 10.0000 mg | ORAL_TABLET | Freq: Four times a day (QID) | ORAL | Status: DC | PRN

## 2013-08-17 MED ORDER — ONDANSETRON HCL 4 MG/2ML IJ SOLN
4.0000 mg | Freq: Once | INTRAMUSCULAR | Status: AC
  Administered 2013-08-17: 4 mg via INTRAVENOUS

## 2013-08-17 MED ORDER — FENTANYL CITRATE 0.05 MG/ML IJ SOLN
25.0000 ug | INTRAMUSCULAR | Status: DC | PRN
  Administered 2013-08-17: 50 ug via INTRAVENOUS

## 2013-08-17 MED ORDER — ONDANSETRON HCL 4 MG/2ML IJ SOLN
4.0000 mg | Freq: Once | INTRAMUSCULAR | Status: AC | PRN
  Administered 2013-08-17: 4 mg via INTRAVENOUS

## 2013-08-17 MED ORDER — LIDOCAINE HCL 1 % IJ SOLN
INTRAMUSCULAR | Status: DC | PRN
  Administered 2013-08-17: 50 mg via INTRADERMAL

## 2013-08-17 MED ORDER — CEFAZOLIN SODIUM 1-5 GM-% IV SOLN
INTRAVENOUS | Status: DC | PRN
  Administered 2013-08-17: 1 g via INTRAVENOUS

## 2013-08-17 MED ORDER — SODIUM CHLORIDE BACTERIOSTATIC 0.9 % IJ SOLN
INTRAMUSCULAR | Status: AC
  Filled 2013-08-17: qty 10

## 2013-08-17 MED ORDER — MIDAZOLAM HCL 2 MG/2ML IJ SOLN
INTRAMUSCULAR | Status: AC
  Filled 2013-08-17: qty 2

## 2013-08-17 MED ORDER — SODIUM CHLORIDE 0.9 % IR SOLN
Status: DC | PRN
  Administered 2013-08-17 (×2): 1000 mL

## 2013-08-17 MED ORDER — CEFAZOLIN SODIUM 1 G IJ SOLR
INTRAMUSCULAR | Status: AC
  Filled 2013-08-17: qty 10

## 2013-08-17 MED ORDER — PROPOFOL 10 MG/ML IV BOLUS
INTRAVENOUS | Status: AC
  Filled 2013-08-17: qty 20

## 2013-08-17 MED ORDER — DEXTROSE 5 % IV SOLN
INTRAVENOUS | Status: DC | PRN
  Administered 2013-08-17: 500 mL

## 2013-08-17 MED ORDER — MIDAZOLAM HCL 2 MG/2ML IJ SOLN
1.0000 mg | INTRAMUSCULAR | Status: AC | PRN
  Administered 2013-08-17 (×3): 2 mg via INTRAVENOUS

## 2013-08-17 SURGICAL SUPPLY — 39 items
BAG DECANTER FOR FLEXI CONT (MISCELLANEOUS) ×2 IMPLANT
BAG HAMPER (MISCELLANEOUS) ×2 IMPLANT
CATH THERMACHOICE III (CATHETERS) ×2 IMPLANT
CLOTH BEACON ORANGE TIMEOUT ST (SAFETY) ×2 IMPLANT
COVER LIGHT HANDLE STERIS (MISCELLANEOUS) ×4 IMPLANT
DECANTER SPIKE VIAL GLASS SM (MISCELLANEOUS) ×2 IMPLANT
DRAPE PROXIMA HALF (DRAPES) ×2 IMPLANT
DRAPE STERI URO 9X17 APER PCH (DRAPES) ×2 IMPLANT
ELECT REM PT RETURN 9FT ADLT (ELECTROSURGICAL) ×2
ELECTRODE REM PT RTRN 9FT ADLT (ELECTROSURGICAL) ×1 IMPLANT
FORMALIN 10 PREFIL 120ML (MISCELLANEOUS) ×2 IMPLANT
FORMALIN 10 PREFIL 480ML (MISCELLANEOUS) ×2 IMPLANT
GAUZE PACKING 2X5 YD STERILE (GAUZE/BANDAGES/DRESSINGS) ×2 IMPLANT
GLOVE BIOGEL PI IND STRL 7.0 (GLOVE) ×2 IMPLANT
GLOVE BIOGEL PI IND STRL 9 (GLOVE) ×1 IMPLANT
GLOVE BIOGEL PI INDICATOR 7.0 (GLOVE) ×2
GLOVE BIOGEL PI INDICATOR 9 (GLOVE) ×1
GLOVE ECLIPSE 9.0 STRL (GLOVE) ×2 IMPLANT
GLOVE EXAM NITRILE LRG STRL (GLOVE) ×2 IMPLANT
GLOVE INDICATOR STER SZ 9 (GLOVE) ×2 IMPLANT
GLOVE SS BIOGEL STRL SZ 6.5 (GLOVE) ×1 IMPLANT
GLOVE SUPERSENSE BIOGEL SZ 6.5 (GLOVE) ×1
GOWN STRL REIN 3XL LVL4 (GOWN DISPOSABLE) ×2 IMPLANT
GOWN STRL REIN XL XLG (GOWN DISPOSABLE) ×4 IMPLANT
INST SET HYSTEROSCOPY (KITS) ×2 IMPLANT
IV D5W 500ML (IV SOLUTION) ×2 IMPLANT
IV NS 1000ML (IV SOLUTION) ×2
IV NS 1000ML BAXH (IV SOLUTION) ×2 IMPLANT
KIT ROOM TURNOVER AP CYSTO (KITS) ×2 IMPLANT
KIT ROOM TURNOVER APOR (KITS) ×2 IMPLANT
MANIFOLD NEPTUNE II (INSTRUMENTS) ×2 IMPLANT
NS IRRIG 1000ML POUR BTL (IV SOLUTION) ×2 IMPLANT
PACK PERI GYN (CUSTOM PROCEDURE TRAY) ×2 IMPLANT
PAD ARMBOARD 7.5X6 YLW CONV (MISCELLANEOUS) ×2 IMPLANT
PAD TELFA 3X4 1S STER (GAUZE/BANDAGES/DRESSINGS) ×2 IMPLANT
SET BASIN LINEN APH (SET/KITS/TRAYS/PACK) ×2 IMPLANT
SET IRRIG Y TYPE TUR BLADDER L (SET/KITS/TRAYS/PACK) ×2 IMPLANT
SWAB CULTURE LIQ STUART DBL (MISCELLANEOUS) ×2 IMPLANT
SYR CONTROL 10ML LL (SYRINGE) ×2 IMPLANT

## 2013-08-17 NOTE — H&P (Addendum)
Tilda Burrow, MD Physician Signed  Progress Notes Service date: 08/16/2013 3:02 PM  Related encounter: Office Visit from 08/16/2013 in [facility]   Patient ID: Joan [name], female DOB: May 07, 1966, 47 y.o. MRN: [id number]  Joan [name] is an 47 y.o. female. She is here for preop for hysteroscopy, dilation andcurettage, removal of endometrial polyp, and endometrial ablation. She was admitted over the weekend to [facility] hospital for continued vaginal bleeding x 3 months, with anemia, requiring transfusion x 3 units prbc. 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 The polyp has a narrow stalk as delineated by Power Doppler portion of the ultrasound.  Treatment options reviewed: and plans for surgical excision of the polyp by hysteroscopy scheduled on an urgent basis due to the significant discomfort of the polyp and its location. The patient requests that endometrial ablation be done as well, to attempt to further reduce future bleeding. This discussed, including alternatives such as delaying endo ablation until after healing from the polyp removal. The patient desires to avoid a two-surgery process. The patient is aware that pathology on the polyp or on the endometrial scrapings to be done [facility]) could produce findings that would require future further treatment, up to and including further surgeries such as hysterectomy.  Pertinent Gynecological History:  Menses: irregular x 3 months  Bleeding: dysfunctional uterine bleeding  Contraception: tubal ligation  DES exposure: unknown  Blood transfusions: 3 units last week  Sexually transmitted diseases: no past history  Previous GYN Procedures: tubal  Last mammogram: none in epic records Date:  Last pap: reported as normal by pt from primary care Date: "last year"  OB History: G4, P4004  Menstrual History:   Menarche age:  Patient's last menstrual period was 05/02/2013.     Past Medical History    Diagnosis  Date    .  Asthma     .  Hypertension     .  Collagen vascular disease       migraine    -- irritable bowel syndrome with chronic abd pain and irregular bm's.    Past Surgical History    Procedure  Laterality  Date    .  Tubal ligation      .  Hernia repair         Family History    Problem  Relation  Age of Onset    .  Diabetes  Sister     .  Hypertension  Sister     .  Heart disease  Sister     .  Cancer  Sister       brain tumor    .  Heart disease  Brother     .  Cancer  Maternal Aunt       uterine    .  Cancer  Maternal Grandmother       uterine     Social History: reports that she has been smoking Cigarettes. She has been smoking about 1.00 pack per day. She has never used smokeless tobacco. She reports that she does not drink alcohol or use illicit drugs.  Allergies:    Allergies    Allergen  Reactions    .  Other       IV meds for migraine        Study Result ultrasound 11/28     CLINICAL DATA: Cervical  fibroid versus polyp.  EXAM:  TRANSVAGINAL ULTRASOUND OF PELVIS  TECHNIQUE:  Transvaginal ultrasound examination of the pelvis was performed  including evaluation of the uterus, ovaries, adnexal regions, and  pelvic cul-de-sac.  COMPARISON: Transabdominal imaging 08/13/2013  FINDINGS:  Uterus  Measurements: 11 x 7.3 x 7 cm. No myometrial masses.  Endometrium  Thickness: 11.1 mm. There is a heterogeneous solid polypoid  structure demonstrated in the endometrium which appears to prolapse  from the upper to the lower uterine segment. This mass measures  about 3.5 x 2 x 2.7 cm. The stalk is visualized. Color flow Doppler  imaging demonstrates flow within the mass. This could represent a  prolapsing fibroid or endometrial polyp.  Right ovary  Measurements: 2 x 2.6 x 1.5 cm. Normal appearance/no adnexal mass.  Left ovary  Measurements: 2 x 1.9 x 0.9 cm.  Normal appearance/no adnexal mass.  Other findings: Minimal free fluid is demonstrated.  IMPRESSION:  There is a prolapsing polypoid structure in the endometrium with  flow demonstrated. This could represent a prolapsing fibroid or  endometrial polyp.  Electronically Signed  By: Burman Nieves M.D.  On: 08/13/2013 22:39      (Not in a hospital admission)  ROS  Blood pressure 132/82, height 5\' 6"  (1.676 m), weight 202 lb 3.2 oz (91.717 kg), last menstrual period 05/02/2013.  Physical Exam  Constitutional: She appears well-developed and well-nourished.  HENT:  Head: Normocephalic and atraumatic.  Eyes: Pupils are equal, round, and reactive to light.  Neck: Normal range of motion. Neck supple.  Cardiovascular: Normal rate and regular rhythm.  Respiratory: Effort normal.  GI: Soft. Bowel sounds are normal.  Genitourinary: Vagina normal.  , VAGINA: normal appearing vagina with normal color and discharge, no lesions, CERVIX: normal appearing cervix without discharge or lesions, multiparous os, the hard firm polyp or fibriod is contacted 1 cm inside the os, precluding endo biopsy, UTERUS: mass enlarges lower uterhat efforts to complete ablation will be done, but that the ability to complete this procedure depends on anatomy and clinical fin

## 2013-08-17 NOTE — Brief Op Note (Signed)
08/17/2013  12:26 PM  PATIENT:  Joan [name]  47 y.o. female  PRE-OPERATIVE DIAGNOSIS:  endometrial polyp, menorrhagia, anemia  POST-OPERATIVE DIAGNOSIS:  endometrial polyp,(probable fibroid ) menorrhagia, anemia  PROCEDURE:  Procedure(s) with comments: DILATATION & CURETTAGE/HYSTEROSCOPY WITH THERMACHOICE ABLATION (N/A) - 18 ml D5W in & out; total therapy time= 9 minutes 33 seconds; temp= 87 degrees celcius REMOVAL ENDOMETRIAL POLYP (N/A)  SURGEON:  Surgeon(s) and Role:    * Tilda Burrow, MD - Primary  PHYSICIAN ASSISTANT:   ASSISTANTS: none   ANESTHESIA:   general and paracervical block  EBL:  Total I/O In: 800 [I.V.:800] Out: -   BLOOD ADMINISTERED:none  DRAINS: none   LOCAL MEDICATIONS USED:  MARCAINE    and Amount: 27 ml  SPECIMEN:  Source of Specimen:  Endometrial fibroid and curettings  DISPOSITION OF SPECIMEN:  PATHOLOGY  COUNTS:  YES  TOURNIQUET:  * No tourniquets in log *  DICTATION: .Dragon Dictation  PLAN OF CARE: Discharge to home after PACU  PATIENT DISPOSITION:  PACU - hemodynamically stable.   Delay start of Pharmacological VTE agent (>24hrs) due to surgical blood loss or risk of bleeding: not applicable

## 2013-08-17 NOTE — Anesthesia Procedure Notes (Signed)
Procedure Name: LMA Insertion Date/Time: 08/17/2013 11:03 AM Performed by: Despina Hidden Pre-anesthesia Checklist: Emergency Drugs available, Patient identified, Suction available and Patient being monitored Patient Re-evaluated:Patient Re-evaluated prior to inductionOxygen Delivery Method: Circle system utilized Preoxygenation: Pre-oxygenation with 100% oxygen Intubation Type: IV induction Ventilation: Mask ventilation without difficulty LMA: LMA inserted LMA Size: 3.0 Tube type: Oral Number of attempts: 1 Placement Confirmation: positive ETCO2 and breath sounds checked- equal and bilateral Tube secured with: Tape Dental Injury: Teeth and Oropharynx as per pre-operative assessment

## 2013-08-17 NOTE — Anesthesia Postprocedure Evaluation (Signed)
  Anesthesia Post-op Note  Patient: Joan [name]  Procedure(s) Performed: Procedure(s) with comments: DILATATION & CURETTAGE/HYSTEROSCOPY WITH THERMACHOICE ABLATION (N/A) - 18 ml D5W in & out; total therapy time= 9 minutes 33 seconds; temp= 87 degrees celcius REMOVAL ENDOMETRIAL POLYP (N/A)  Patient Location: PACU  Anesthesia Type:General  Level of Consciousness: awake, alert , oriented and patient cooperative  Airway and Oxygen Therapy: Patient Spontanous Breathing  Post-op Pain: none  Post-op Assessment: Post-op Vital signs reviewed, Patient's Cardiovascular Status Stable, Respiratory Function Stable, Patent Airway, No signs of Nausea or vomiting and Pain level controlled  Post-op Vital Signs: Reviewed and stable  Complications: No apparent anesthesia complications

## 2013-08-17 NOTE — Transfer of Care (Signed)
Immediate Anesthesia Transfer of Care Note  Patient: Nada Libman  Procedure(s) Performed: Procedure(s) with comments: DILATATION & CURETTAGE/HYSTEROSCOPY WITH THERMACHOICE ABLATION (N/A) - 18 ml D5W in & out; total therapy time= 9 minutes 33 seconds; temp= 87 degrees celcius REMOVAL ENDOMETRIAL POLYP (N/A)  Patient Location: PACU  Anesthesia Type:General  Level of Consciousness: awake and patient cooperative  Airway & Oxygen Therapy: Patient Spontanous Breathing and Patient connected to face mask oxygen  Post-op Assessment: Report given to PACU RN, Post -op Vital signs reviewed and stable and Patient moving all extremities  Post vital signs: Reviewed and stable  Complications: No apparent anesthesia complications

## 2013-08-17 NOTE — OR Nursing (Signed)
Up to bathroom to void.  Blood noted to bed and in commode .

## 2013-08-17 NOTE — Anesthesia Preprocedure Evaluation (Signed)
Anesthesia Evaluation  Patient identified by MRN, date of birth, ID band Patient awake    Reviewed: Allergy & Precautions, H&P , NPO status , Patient's Chart, lab work & pertinent test results  History of Anesthesia Complications Negative for: history of anesthetic complications  Airway Mallampati: II TM Distance: >3 FB Neck ROM: Full    Dental  (+) Teeth Intact and Partial Upper   Pulmonary asthma , Current Smoker,  breath sounds clear to auscultation        Cardiovascular hypertension, Pt. on medications Rhythm:Regular Rate:Normal     Neuro/Psych  Headaches, PSYCHIATRIC DISORDERS Anxiety    GI/Hepatic negative GI ROS,   Endo/Other    Renal/GU      Musculoskeletal   Abdominal   Peds  Hematology   Anesthesia Other Findings   Reproductive/Obstetrics                           Anesthesia Physical Anesthesia Plan  ASA: II  Anesthesia Plan: General   Post-op Pain Management:    Induction: Intravenous  Airway Management Planned: LMA  Additional Equipment:   Intra-op Plan:   Post-operative Plan: Extubation in OR  Informed Consent: I have reviewed the patients History and Physical, chart, labs and discussed the procedure including the risks, benefits and alternatives for the proposed anesthesia with the patient or authorized representative who has indicated his/her understanding and acceptance.     Plan Discussed with:   Anesthesia Plan Comments:         Anesthesia Quick Evaluation

## 2013-08-17 NOTE — Op Note (Signed)
  12:26 PM  PATIENT:  [place]  47 y.o. female  PRE-OPERATIVE DIAGNOSIS:  endometrial polyp, menorrhagia, anemia  POST-OPERATIVE DIAGNOSIS:  endometrial polyp,(probable fibroid ) menorrhagia, anemia  PROCEDURE:  Procedure(s) with comments: DILATATION & CURETTAGE/HYSTEROSCOPY WITH THERMACHOICE ABLATION (N/A) - 18 ml D5W in & out; total therapy time= 9 minutes 33 seconds; temp= 87 degrees celcius REMOVAL ENDOMETRIAL POLYP (N/A)  SURGEON:  Surgeon(s) and Role:    * Tilda Burrow, MD - Primary  Details of procedure: Patient was taken operating room prepped and draped for vaginal procedure with timeout conducted and Ancef administered 2 g and patient procedure confirmed by surgical team. Cervix was grasped single-tooth tenaculum and uterus sounded. The polyp was higher than noted last time. Hysteroscope to be directly inserted as the cervix was already dilated.  the photos were taken of the tubal ostia. Endometrial lining was thin throughout the upper portion of the uterine cavity. There was some irregularity suggesting some uterine fibroids that were deep within the myometrium and only minimally deforming the endometrial cavity. As we extracted the hysteroscope with photos taken when we saw the base of the polyp ,the photos 4 and 5 showed a broad 1 cm wide polyp base which was then transected with the hysteroscopic scissors. Photos 6 shows the large polyp sitting at the lower uterine segment occluding view of the upper endometrial cavity. Photo 7 shows the base of the polyp stalk after extraction of the polyp which was taken out in pieces after blunt morcellation using ring forceps and Metzenbaum scissors. Photo 8 shows the tubal ostia and the intact uterine fundus. Endometrial curettage was performed with sharp smooth curette removing tissue fragments. Hysteroscopic inspection showed an intact endometrial cavity with surface tissues having been removed. Endometrial ablation followed: The  cervix was grasped with single-tooth tenaculum and as before and the Gynecare ThermaChoice 3 endometrial ablation device prepared and inserted and insufflated at a depth of 11 cm, with 18 cc of D5W to fill the endometrial cavity. The 8 minute thermal ablation process was then performed and the fluid recovered. Paracervical block using 27 cc of Marcaine with epinephrine was then performed and patient allowed to go recovery in stable condition sponge and needle counts correct Patient to recovery

## 2013-08-20 ENCOUNTER — Encounter (HOSPITAL_COMMUNITY): Payer: Self-pay | Admitting: Obstetrics and Gynecology

## 2013-09-06 ENCOUNTER — Encounter: Payer: BC Managed Care – PPO | Admitting: Obstetrics and Gynecology

## 2013-09-07 ENCOUNTER — Encounter: Payer: Self-pay | Admitting: Adult Health

## 2013-09-07 ENCOUNTER — Ambulatory Visit (INDEPENDENT_AMBULATORY_CARE_PROVIDER_SITE_OTHER): Payer: BC Managed Care – PPO | Admitting: Adult Health

## 2013-09-07 ENCOUNTER — Telehealth: Payer: Self-pay

## 2013-09-07 VITALS — BP 110/76 | Ht 66.0 in | Wt 209.0 lb

## 2013-09-07 DIAGNOSIS — R319 Hematuria, unspecified: Secondary | ICD-10-CM

## 2013-09-07 DIAGNOSIS — M549 Dorsalgia, unspecified: Secondary | ICD-10-CM | POA: Insufficient documentation

## 2013-09-07 DIAGNOSIS — Z9889 Other specified postprocedural states: Secondary | ICD-10-CM

## 2013-09-07 HISTORY — DX: Hematuria, unspecified: R31.9

## 2013-09-07 LAB — POCT URINALYSIS DIPSTICK: Glucose, UA: NEGATIVE

## 2013-09-07 MED ORDER — CEPHALEXIN 500 MG PO CAPS: 500.0000 mg | ORAL_CAPSULE | Freq: Four times a day (QID) | ORAL | Status: DC

## 2013-09-07 MED ORDER — CYCLOBENZAPRINE HCL 5 MG PO TABS: 5.0000 mg | ORAL_TABLET | Freq: Three times a day (TID) | ORAL | Status: DC | PRN

## 2013-09-07 NOTE — Telephone Encounter (Signed)
Pt states missed her post op appt yesterday is now having leg, abdominal, and back pain. Per Cyril Mourning, NP can see pt this afternoon at 4 pm.

## 2013-09-07 NOTE — Addendum Note (Signed)
Addended by: Cyril Mourning A on: 09/07/2013 04:49 PM   Modules accepted: Orders

## 2013-09-07 NOTE — Progress Notes (Signed)
Subjective:     Patient ID: Joan [name], female   DOB: 02-Dec-1965, 47 y.o.   MRN: [id number]  HPI Taylia is a 47 year old black female who had an endometrial ablation 08/17/13 by Dr Emelda Fear and is in complaining of pain in back,legs and stomach,ever since surgery.No known fever had hot flashes at times.  Review of Systems See HPI Reviewed past medical,surgical, social and family history. Reviewed medications and allergies.     Objective:   Physical Exam BP 110/76  Ht 5\' 6"  (1.676 m)  Wt 209 lb (94.802 kg)  BMI 33.75 kg/m2  LMP 05/02/2013   urine dipstick trace protein and 3+blood, Skin warm and dry.Pelvic: external genitalia is normal in appearance, vagina: scan bloody discharge without odor, cervix:smooth and bulbous,negative CMT, uterus: normal size, shape and contour, non tender, no masses felt, adnexa: no masses or tenderness noted. Has tenderness bilateral flank area.Dr Emelda Fear in to talk with pt.  Assessment:     Back pain  Hematuria     Plan:     Urine sent for UA C&S,push fluids Rx flexeril 5 mg #30 1 every 8 hours prn no refills Rx Keflex 500 mg #28 1 qid x 7 days Use ice 10 minutes on back then off  Return 09/17/13 with JVF as scheduled

## 2013-09-07 NOTE — Patient Instructions (Signed)
Ice 10 minutes on back  Take flexeril Take keflex and push fluids Keep appt 1/2 Call Monday

## 2013-09-08 LAB — URINALYSIS
Ketones, ur: NEGATIVE mg/dL
Nitrite: NEGATIVE
Protein, ur: NEGATIVE mg/dL
Urobilinogen, UA: 0.2 mg/dL (ref 0.0–1.0)
pH: 6 (ref 5.0–8.0)

## 2013-09-09 LAB — URINE CULTURE
Colony Count: NO GROWTH
Organism ID, Bacteria: NO GROWTH

## 2013-09-13 ENCOUNTER — Telehealth: Payer: Self-pay | Admitting: Adult Health

## 2013-09-13 MED ORDER — NYSTATIN 100000 UNIT/ML MT SUSP: 5.0000 mL | Freq: Four times a day (QID) | OROMUCOSAL | Status: DC

## 2013-09-13 NOTE — Telephone Encounter (Signed)
Pt informed of no growth on urine culture. Pt requesting "nystatin mouth wash for dry mouth."

## 2013-09-13 NOTE — Telephone Encounter (Signed)
Back feels better, but has white areas in mouth and feels dry has appt 1/2 but requests nystatin oral susp, will call in

## 2013-09-17 ENCOUNTER — Encounter: Payer: Self-pay | Admitting: Obstetrics and Gynecology

## 2013-09-17 ENCOUNTER — Ambulatory Visit (INDEPENDENT_AMBULATORY_CARE_PROVIDER_SITE_OTHER): Payer: BC Managed Care – PPO | Admitting: Obstetrics and Gynecology

## 2013-09-17 VITALS — BP 120/80 | Ht 66.0 in | Wt 209.0 lb

## 2013-09-17 DIAGNOSIS — Z9889 Other specified postprocedural states: Secondary | ICD-10-CM

## 2013-09-17 DIAGNOSIS — N904 Leukoplakia of vulva: Secondary | ICD-10-CM

## 2013-09-17 MED ORDER — NYSTATIN-TRIAMCINOLONE 100000-0.1 UNIT/GM-% EX OINT: 1.0000 "application " | TOPICAL_OINTMENT | Freq: Two times a day (BID) | CUTANEOUS | Status: DC

## 2013-09-17 NOTE — Progress Notes (Signed)
Patient ID: Joan [name], female   DOB: 18-Sep-1965, 48 y.o.   MRN: [id number] Pt here for post op visit. Pt states that she is still having pain in her back, and still has spots in her mouth and a white film on her tongue.   also has vulvar irritation and itching.  Subjective:  Joan [name] is a 48 y.o. female who presents to the clinic 4 weeks status post operative hysteroscopy and endo ablation.    Review of Systems Negative except tongue papillae enlargement, feels better since nystatin swish and swallow  She has been eating a regular diet without difficulty.   Bowel movements are normal. The patient is not having any pain.  Objective:  BP 120/80  Ht 5\' 6"  (1.676 m)  Wt 209 lb (94.802 kg)  BMI 33.75 kg/m2 General:Well developed, well nourished.  No acute distress. Abdomen: Bowel sounds normal, soft, non-tender. Pelvic Exam:    External Genitalia:  Normal.    Vagina: Normal    Bimanual: Normal    Cervix: lite piink discharge still, not watery    Uterus: nontender  Normal    Adnexa: nontender Normal Vulva excoriated. Probable chronic moisture and Longstanding vulvar dystrophy I  Assessment:  Post-Op 4 weeks s/p polypectomy , endo ablation   Vulvar dystrophy Tongue papillae irritated Doing well postoperatively.   Plan:  1.Wound care discussed  Dry vulva regimen with blow dryer bid, tissue between labia, loose panties 2. .Continue any current medications. 3. Activity restrictions: none 4. return to work: now. 5. Follow up in prn prn. 6. Rx mycolg

## 2013-09-22 ENCOUNTER — Telehealth: Payer: Self-pay | Admitting: Obstetrics and Gynecology

## 2013-09-22 DIAGNOSIS — M549 Dorsalgia, unspecified: Secondary | ICD-10-CM

## 2013-09-22 MED ORDER — CYCLOBENZAPRINE HCL 5 MG PO TABS: 5.0000 mg | ORAL_TABLET | Freq: Three times a day (TID) | ORAL | Status: DC | PRN

## 2013-09-22 MED ORDER — OXYCODONE-ACETAMINOPHEN 10-325 MG PO TABS: 1.0000 | ORAL_TABLET | ORAL | Status: DC | PRN

## 2013-09-22 NOTE — Telephone Encounter (Signed)
Pt with a hx of headaches, was taking percocet 10 for that.  Was using it for back ache which pt has had ,without fever, since procedure. (hysteroscopy, D&C , removal of fibroid and endomerial ablation.)  Pt alleges lite blood per vagina last  2 days, no fever. Plan : will refil percocet 10 x 30 tabs, must be seen before further meds. Also flexeril refilled

## 2013-09-22 NOTE — Telephone Encounter (Signed)
Pt c/o abdominal and back pain. Pt requesting RX for Percocet. Informed pt would need to make an appt to discuss pain med with Dr. Glo Herring. Pt states could not come in due to husband unable to take off for work to bring pt.   Informed pt would route message to Dr. Glo Herring on go from there.

## 2013-09-27 ENCOUNTER — Encounter: Payer: Self-pay | Admitting: *Deleted

## 2013-09-27 ENCOUNTER — Ambulatory Visit: Payer: BC Managed Care – PPO | Admitting: Obstetrics and Gynecology

## 2013-10-25 ENCOUNTER — Encounter: Payer: Self-pay | Admitting: Obstetrics and Gynecology

## 2013-10-25 ENCOUNTER — Ambulatory Visit (INDEPENDENT_AMBULATORY_CARE_PROVIDER_SITE_OTHER): Payer: BC Managed Care – PPO | Admitting: Obstetrics and Gynecology

## 2013-10-25 VITALS — BP 142/84 | Ht 66.0 in | Wt 209.8 lb

## 2013-10-25 DIAGNOSIS — N938 Other specified abnormal uterine and vaginal bleeding: Secondary | ICD-10-CM

## 2013-10-25 DIAGNOSIS — M545 Low back pain, unspecified: Secondary | ICD-10-CM

## 2013-10-25 DIAGNOSIS — N949 Unspecified condition associated with female genital organs and menstrual cycle: Secondary | ICD-10-CM

## 2013-10-25 DIAGNOSIS — Z1389 Encounter for screening for other disorder: Secondary | ICD-10-CM

## 2013-10-25 DIAGNOSIS — N925 Other specified irregular menstruation: Secondary | ICD-10-CM

## 2013-10-25 LAB — POCT URINALYSIS DIPSTICK
GLUCOSE UA: NEGATIVE
Ketones, UA: NEGATIVE
Leukocytes, UA: NEGATIVE
NITRITE UA: NEGATIVE

## 2013-10-25 MED ORDER — CYCLOBENZAPRINE HCL 5 MG PO TABS: 5.0000 mg | ORAL_TABLET | Freq: Three times a day (TID) | ORAL | Status: DC | PRN

## 2013-10-25 MED ORDER — OXYCODONE-ACETAMINOPHEN 5-325 MG PO TABS
1.0000 | ORAL_TABLET | ORAL | Status: DC | PRN
Start: 2013-10-25 — End: 2014-02-21

## 2013-10-25 MED ORDER — OXYCODONE-ACETAMINOPHEN 10-325 MG PO TABS: 1.0000 | ORAL_TABLET | Freq: Two times a day (BID) | ORAL | Status: DC | PRN

## 2013-10-25 NOTE — Progress Notes (Signed)
This chart was transcribed for Dr. Mallory Shirk by Ludger Nutting, ED scribe.    [facility] Visit  Patient name: Joan [name] MRN [id number]  Date of birth: Jul 18, 1966  CC & HPI:  Joan [name] is a 48 y.o. female presenting today for vaginal bleeding s/p hysteroscopy and endo ablation over 1 month ago. Pt reports heavier periods with clots prior to the endo ablation. She states it has become much lighter.   She also complains of lower back pain with associated bilateral thigh and inner thigh pain. Pt states she has run out of Percocet. She reports taking 2-3 daily. She has an appointment with Dr. Hassell Done on 11/04/13.   ROS:  Nausea Negative except for listed above.   Pertinent History Reviewed:  Medical & Surgical Hx:  Reviewed: Significant for   Past Medical History  Diagnosis Date  . Asthma   . Hypertension   . Collagen vascular disease     migraine  . Anxiety   . Anemia   . Hematuria 09/07/2013    Past Surgical History  Procedure Laterality Date  . Tubal ligation    . Hernia repair      ventral hernia  . Ulnar nerve repair Left     [facility] 2013  . Dilitation & currettage/hystroscopy with thermachoice ablation N/A 08/17/2013    Procedure: DILATATION & CURETTAGE/HYSTEROSCOPY WITH THERMACHOICE ABLATION;  Surgeon: Jonnie Kind, MD;  Location: [facility];  Service: Gynecology;  Laterality: N/A;  18 ml D5W in & out; total therapy time= 9 minutes 33 seconds; temp= 87 degrees celcius  . Polypectomy N/A 08/17/2013    Procedure: REMOVAL ENDOMETRIAL POLYP;  Surgeon: Jonnie Kind, MD;  Location: [facility];  Service: Gynecology;  Laterality: N/A;    Medications: Reviewed & Updated - see associated section Social History: Reviewed -  reports that she has been smoking Cigarettes.  She has a 26 pack-year smoking history. She has never used smokeless tobacco.  Objective Findings:  Vitals: BP 142/84  Ht 5\' 6"  (1.676 m)  Wt 209 lb 12.8 oz (95.165 kg)  BMI 33.88 kg/m2  Physical  Examination: General appearance - alert, well appearing, and in no distress and oriented to person, place, and time Pelvic - VULVA: normal appearing vulva with no masses, tenderness or lesions,  VAGINA: normal appearing vagina with normal color and discharge, no lesions,  CERVIX: normal appearing cervix without discharge or lesions,  UTERUS: uterus is normal size, shape, consistency and nontender,  ADNEXA: normal adnexa in size, nontender and no masses Musculoskeletal - 5/5 strength in BLE.    Assessment & Plan:   A: 1.   P: 1. Flexeril 5mg , #30 1 p.o bid for muscle pain  2 percocet 5 x 20 tabs til seen by pain md  Pt AWARE THAT I WILL WRITE NO MORE OPIATES FOR NON-GYN C/O

## 2013-10-25 NOTE — Addendum Note (Signed)
Addended by: Doyne Keel on: 10/25/2013 03:23 PM   Modules accepted: Orders, Medications

## 2013-10-25 NOTE — Patient Instructions (Signed)
Your future percocets will need to come from Dr Hassell Done

## 2013-11-04 ENCOUNTER — Other Ambulatory Visit: Payer: Self-pay | Admitting: Family Medicine

## 2013-11-04 DIAGNOSIS — R51 Headache: Principal | ICD-10-CM

## 2013-11-04 DIAGNOSIS — R519 Headache, unspecified: Secondary | ICD-10-CM

## 2013-11-05 ENCOUNTER — Other Ambulatory Visit: Payer: BC Managed Care – PPO

## 2013-12-16 ENCOUNTER — Other Ambulatory Visit: Payer: Self-pay | Admitting: Pain Medicine

## 2013-12-16 DIAGNOSIS — M545 Low back pain, unspecified: Secondary | ICD-10-CM

## 2013-12-16 DIAGNOSIS — M542 Cervicalgia: Secondary | ICD-10-CM

## 2013-12-18 ENCOUNTER — Other Ambulatory Visit: Payer: Self-pay | Admitting: Obstetrics and Gynecology

## 2013-12-18 ENCOUNTER — Ambulatory Visit
Admission: RE | Admit: 2013-12-18 | Discharge: 2013-12-18 | Disposition: A | Discharge status: Home / Self Care | Payer: BC Managed Care – PPO | Source: Ambulatory Visit | Attending: Pain Medicine | Admitting: Pain Medicine

## 2013-12-18 DIAGNOSIS — M545 Low back pain, unspecified: Secondary | ICD-10-CM

## 2013-12-18 DIAGNOSIS — M542 Cervicalgia: Secondary | ICD-10-CM

## 2013-12-22 ENCOUNTER — Other Ambulatory Visit: Payer: Self-pay | Admitting: Obstetrics and Gynecology

## 2013-12-29 ENCOUNTER — Other Ambulatory Visit: Payer: Self-pay | Admitting: Family Medicine

## 2013-12-29 DIAGNOSIS — G44009 Cluster headache syndrome, unspecified, not intractable: Secondary | ICD-10-CM

## 2013-12-29 DIAGNOSIS — S0990XS Unspecified injury of head, sequela: Secondary | ICD-10-CM

## 2013-12-29 DIAGNOSIS — G44309 Post-traumatic headache, unspecified, not intractable: Secondary | ICD-10-CM

## 2014-01-03 ENCOUNTER — Ambulatory Visit
Admission: RE | Admit: 2014-01-03 | Discharge: 2014-01-03 | Disposition: A | Discharge status: Home / Self Care | Payer: BC Managed Care – PPO | Source: Ambulatory Visit | Attending: Family Medicine | Admitting: Family Medicine

## 2014-01-03 DIAGNOSIS — G44009 Cluster headache syndrome, unspecified, not intractable: Secondary | ICD-10-CM

## 2014-01-03 MED ORDER — GADOBENATE DIMEGLUMINE 529 MG/ML IV SOLN
18.0000 mL | Freq: Once | INTRAVENOUS | Status: AC | PRN
  Administered 2014-01-03: 18 mL via INTRAVENOUS

## 2014-02-21 ENCOUNTER — Encounter: Payer: Self-pay | Admitting: Diagnostic Neuroimaging

## 2014-02-21 ENCOUNTER — Ambulatory Visit (INDEPENDENT_AMBULATORY_CARE_PROVIDER_SITE_OTHER): Payer: BC Managed Care – PPO | Admitting: Diagnostic Neuroimaging

## 2014-02-21 VITALS — BP 115/80 | HR 84 | Temp 99.1°F | Ht 66.0 in | Wt 204.0 lb

## 2014-02-21 DIAGNOSIS — G5622 Lesion of ulnar nerve, left upper limb: Secondary | ICD-10-CM

## 2014-02-21 DIAGNOSIS — G5620 Lesion of ulnar nerve, unspecified upper limb: Secondary | ICD-10-CM

## 2014-02-21 DIAGNOSIS — G5640 Causalgia of unspecified upper limb: Secondary | ICD-10-CM

## 2014-02-21 NOTE — Patient Instructions (Signed)
Continue pain mgmt.  Consider psychiatry/psychology eval and treatment.

## 2014-02-21 NOTE — Progress Notes (Signed)
[facility]  PATIENT: Joan [name] DOB: March 19, 1966  REFERRING CLINICIAN: K Price HISTORY FROM: patient  REASON FOR VISIT: new consult (existing patient)   HISTORICAL  CHIEF COMPLAINT:  Chief Complaint  Patient presents with  . Pain    L upper extremities    HISTORY OF PRESENT ILLNESS:   UPDATE 02/21/14: Since last visit, had second opinion at [facility] (Dr. 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 She had 2 more EMGs since last visit (1 before and 1 after surgical decompression). In Dec 2013 she had left ulnar nerve exploration and decompression, with confirmed ulnar nerve compression at olecranon groove; unfortunately she had no benefit in symptoms following her surgery. Since then she continued to have worsening pain and weakness in the left hand. She tried pain mgmt (Dr. Vira Blanco) with cervical ganglion block (initially great results after 1st injection, but followup 4-5 injections not helpful). Now on pain meds, gabapentin and narcotics. Also with severe depression as a result. Had followup MRI brain and c-spine, which showed C5-6 disc bulging and some new gliosis/cord isgnal abnl at same level. Referred here to see if there are any other neurologic causes of her pain/CRPS.   UPDATE 03/12/12: Since last visit, left hand strength is stable. But since last visit, she now is having int spells with left hand pain, numbness, cold sensation, sometimes assoc with "hives" developing in the LUE and spreading to entire body. This is followed by whole body "paralysis" and inability to speak. She is awake and can hear. Sometimes she can blink to command and move her fingers. Spells last 15 min up to 2 hours. Now they occur 3-4 times per week.   UPDATE 11/20/11: Doing slightly better. Weakness slightly improving. Sensation normal. Reviewed test results. Patient is back to working out.  PRIOR HPI (07/30/11): 48 year old right-handed female with  history of hypertension, here for evaluation of left upper extremity numbness, pain and weakness since July 2012.  Patient reports gradual onset numbness in her left hand fourth and fifth digits, with numbness and pain radiating to her left shoulder. She is also noticed muscle atrophy and weakness.  She has intermittent twitching in her left hand. Denies problems with her right arm or legs. Denies any facial numbness or weakness or vision problems.   REVIEW OF SYSTEMS: Full 14 system review of systems performed and notable only for constipation feels hot / cold insomnia depression numbness and weakness.  ALLERGIES: Allergies  Allergen Reactions  . Other     IV meds for migraine, patient stated it made her feel like something was crawling all over her body.    HOME MEDICATIONS: Outpatient Prescriptions Prior to Visit  Medication Sig Dispense Refill  . albuterol (PROVENTIL HFA;VENTOLIN HFA) 108 (90 BASE) MCG/ACT inhaler Inhale 2 puffs into the lungs every 6 (six) hours as needed.      . cyclobenzaprine (FLEXERIL) 5 MG tablet Take 1 tablet (5 mg total) by mouth 3 (three) times daily as needed for muscle spasms.  30 tablet  0  . cyclobenzaprine (FLEXERIL) 5 MG tablet TAKE 1 TABLET BY MOUTH THREE TIMES DAILY AS NEEDED FOR MUSCLE SPASMS  60 tablet  0  . gabapentin (NEURONTIN) 600 MG tablet Take 600 mg by mouth 3 (three) times daily.      Marland Kitchen ketorolac (TORADOL) 10 MG tablet Take 1 tablet (10 mg total) by mouth every 6 (six) hours as needed.  20 tablet  0  . nystatin (MYCOSTATIN) 100000 UNIT/ML  suspension Take 5 mLs (500,000 Units total) by mouth 4 (four) times daily.  60 mL  0  . nystatin-triamcinolone ointment (MYCOLOG) Apply 1 application topically 2 (two) times daily. To affected area.  30 g  prn  . olmesartan-hydrochlorothiazide (BENICAR HCT) 40-12.5 MG per tablet Take 1 tablet by mouth daily.      Marland Kitchen oxyCODONE-acetaminophen (PERCOCET) 10-325 MG per tablet Take 1 tablet by mouth every 4 (four)  hours as needed for pain.  30 tablet  0  . PRESCRIPTION MEDICATION 28 day birth control. Not sure of name of medication      . promethazine (PHENERGAN) 25 MG tablet Take 25 mg by mouth every 6 (six) hours as needed for nausea or vomiting.      Marland Kitchen zolpidem (AMBIEN) 10 MG tablet Take 10 mg by mouth at bedtime as needed.      . cephALEXin (KEFLEX) 500 MG capsule Take 1 capsule (500 mg total) by mouth 4 (four) times daily.  28 capsule  0  . DULoxetine (CYMBALTA) 60 MG capsule Take 60 mg by mouth daily.      Marland Kitchen oxyCODONE-acetaminophen (PERCOCET/ROXICET) 5-325 MG per tablet Take 1 tablet by mouth every 4 (four) hours as needed.  20 tablet  0   No facility-administered medications prior to visit.    PAST MEDICAL HISTORY: Past Medical History  Diagnosis Date  . Asthma   . Hypertension   . Collagen vascular disease     migraine  . Anxiety   . Anemia   . Hematuria 09/07/2013  . Complex regional pain syndrome   . Migraine     PAST SURGICAL HISTORY: Past Surgical History  Procedure Laterality Date  . Tubal ligation    . Hernia repair      ventral hernia  . Ulnar nerve repair Left     [facility] 2013  . Dilitation & currettage/hystroscopy with thermachoice ablation N/A 08/17/2013    Procedure: DILATATION & CURETTAGE/HYSTEROSCOPY WITH THERMACHOICE ABLATION;  Surgeon: Jonnie Kind, MD;  Location: [facility];  Service: Gynecology;  Laterality: N/A;  18 ml D5W in & out; total therapy time= 9 minutes 33 seconds; temp= 87 degrees celcius  . Polypectomy N/A 08/17/2013    Procedure: REMOVAL ENDOMETRIAL POLYP;  Surgeon: Jonnie Kind, MD;  Location: [facility];  Service: Gynecology;  Laterality: N/A;    FAMILY HISTORY: Family History  Problem Relation Age of Onset  . Diabetes Sister   . Hypertension Sister   . Heart disease Sister   . Cancer Sister     brain tumor  . Heart disease Brother   . Diabetes Brother   . Cancer Maternal Aunt     uterine  . Cancer Maternal Grandmother     uterine  . Other  Mother     car accident    SOCIAL HISTORY:  History   Social History  . Marital Status: Married    Spouse Name: Mitzi Hansen    Number of Children: 4  . Years of Education: College   Occupational History  .      n/a   Social History Main Topics  . Smoking status: Current Every Day Smoker -- 1.00 packs/day for 26 years    Types: Cigarettes  . Smokeless tobacco: Never Used  . Alcohol Use: No  . Drug Use: No  . Sexual Activity: Yes    Birth Control/ Protection: None, Surgical     Comment: not at this time   Other Topics Concern  . Not on file  Social History Narrative   Patient lives at home with family.   Caffeine Use: 6-7 cups daily     PHYSICAL EXAM  Filed Vitals:   02/21/14 1032  BP: 115/80  Pulse: 84  Temp: 99.1 F (37.3 C)  TempSrc: Oral  Height: '5\' 6"'  (1.676 m)  Weight: 204 lb (92.534 kg)    Not recorded    Body mass index is 32.94 kg/(m^2).  GENERAL EXAM: Patient is TEARFUL. LEFT ARM PAIN. NECK AND LEFT ARM WITH DECR ROM. GUARDS LEFT ARM AND HAND IN FLEXED POSITION AT HER SIDE.   Mental Status: Awake, alert. Language is fluent and comprehension intact. Cranial Nerves: No evidence of papilledema on funduscopic exam.  Pupils are equal and reactive to light.  Visual fields are full to confrontation.  Conjugate eye movements are full and symmetric.  Facial sensation and strength are symmetric.  Hearing is intact.  Palate elevated symmetrically and uvula is midline.  Shoulder shrug is symmetric.  Tongue is midline. Motor: Normal bulk and tone.  RUE 4/5 (LIMITED BY PAIN). LUE GUARDED. DECR PASSIVE ROM AT SHOULDER, ELBOW, WRIST AND FINGERS. DIGITS 3-5 FLEXED. OVERALL LUE 2-3 PROX AND 1-2 DISTAL. LEFT FDI ATROPHY.  BLE 5/5. Sensory: Intact and symmetric to light touch, pinprick, temperature, vibration; DECR PP IN LEFT FINGERS. Coordination: No ataxia or dysmetria on finger-nose or rapid alternating movement testing. Gait and Station: Narrow based gait, able to  walk on heels and toes.  Tandem gait is stable.  Romberg is negative. Reflexes: RUE AND BLE 1; LUE LIMITED BY PAIN AND POOR RELAXATION    DIAGNOSTIC DATA (LABS, IMAGING, TESTING) - I reviewed patient records, labs, notes, testing and imaging myself where available.  Lab Results  Component Value Date   WBC 9.9 08/16/2013   HGB 10.0* 08/16/2013   HCT 31.4* 08/16/2013   MCV 81.1 08/16/2013   PLT 408* 08/16/2013      Component Value Date/Time   NA 139 08/16/2013 1600   K 3.4* 08/16/2013 1600   CL 99 08/16/2013 1600   CO2 28 08/16/2013 1600   GLUCOSE 88 08/16/2013 1600   BUN 5* 08/16/2013 1600   CREATININE 0.65 08/16/2013 1600   CALCIUM 9.6 08/16/2013 1600   PROT 6.7 08/13/2013 1353   ALBUMIN 2.5* 08/13/2013 1353   AST 18 08/13/2013 1353   ALT 15 08/13/2013 1353   ALKPHOS 111 08/13/2013 1353   BILITOT 0.2* 08/13/2013 1353   GFRNONAA >90 08/16/2013 1600   GFRAA >90 08/16/2013 1600   No results found for this basename: CHOL, HDL, LDLCALC, LDLDIRECT, TRIG, CHOLHDL   No results found for this basename: HGBA1C   No results found for this basename: VITAMINB12   No results found for this basename: TSH    08/14/11 MRI CERVICAL - At C5-6 there is mild spinal stenosis and no foraminal stenosis due to disc bulging.  No cord signal changes.    08/14/11 EMG/NCS 1. Severe left ulnar neuropathy 8cm proximal to the ulnar groove at the elbow, with partial conduction block on nerve conduction studies and chronic denervation changes on needle EMG.  This may be due to compression neuropathy related to arcade of struthers or medial intermuscular septum.  Chronic This correlates with the patient's primary symptoms. 2. 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 3. Mild right ulnar sensory motor neuropathy at the wrist and right radial sensory neuropathy are subclinical. 4. No evidence of cervical radiculopathy or brachial plexopathy.  04/01/12 MRI brain -  normal    04/01/12 EEG - normal  Labs (ANCA, SPEP, UPEP, HIV, A1C, TSH, ACE, ANA, ESR, CRP, B12) all normal.  LP (WBC 1, RBC 1, glucose 61, protein 43, ACE 7 (nl), CMV PCR neg, cytopath --> mixed inflam cells, Lyme ab neg, gram stain/cx, viral cx neg).  12/18/13 MRI cervical spine - Stenosis at C5-6 related to central protrusion with bony overgrowth. Possible left C6 nerve root impingement. Canal stenosis measuring 6-7 mm AP diameter with abnormal cord signal on the right, likely chronic ischemia.   12/19/13 MRI lumbar spine - normal  01/04/14 MRI brain - normal    ASSESSMENT AND PLAN  48 y.o. year old female here with left upper extremity numbness, weakness and pain since July 2012. Confirmed to left ulnar nerve compression at the elbow, s/p left ulnar nerve decompression. Now with ongoing intractable pain, possibly due to complex regional pain syndrome type 2. Also with significant depression/anxiety as a result. Has seen spine surgeon re: cervical spine stenosis and cord signal changes, but they do not feel surgical decompression would be beneficial. I agree that the cord signal change is likely chronic gliosis or myelomalacia from degenerative/compressive/traumatic etiology, and not likely demyelinating/inflamm.   Dx: left ulnar compressive neuropathy, s/p decompression, with resultant CRPS type 2  PLAN: - agree with pain management per Dr. Vira Blanco - consider psychiatry/psychology eval and mgmt of mood disturbance - no further neurologic testing advised  Return if symptoms worsen or fail to improve, for return to PCP and pain mgmt.    Penni Bombard, MD 01/23/7415, 38:45 AM Certified in Neurology, Neurophysiology and Neuroimaging  [facility] [address] [place], [place] [address] [phone]

## 2014-04-04 ENCOUNTER — Telehealth: Payer: Self-pay | Admitting: Diagnostic Neuroimaging

## 2014-04-04 NOTE — Telephone Encounter (Signed)
Pt requesting appointment with Dr. Leta Baptist.  She stated she couldn't move her head to left or bend forward and she's experiencing pain at top of back.  Please call and advise.  Thanks

## 2014-05-25 ENCOUNTER — Encounter: Payer: Self-pay | Admitting: Physical Medicine & Rehabilitation

## 2014-06-27 ENCOUNTER — Encounter: Payer: BC Managed Care – PPO | Attending: Physical Medicine & Rehabilitation

## 2014-06-27 ENCOUNTER — Encounter: Payer: Self-pay | Admitting: Physical Medicine & Rehabilitation

## 2014-06-27 ENCOUNTER — Ambulatory Visit (HOSPITAL_BASED_OUTPATIENT_CLINIC_OR_DEPARTMENT_OTHER): Payer: BC Managed Care – PPO | Admitting: Physical Medicine & Rehabilitation

## 2014-06-27 VITALS — BP 128/80 | HR 103 | Resp 14 | Ht 66.0 in | Wt 190.0 lb

## 2014-06-27 DIAGNOSIS — G5642 Causalgia of left upper limb: Secondary | ICD-10-CM | POA: Diagnosis not present

## 2014-06-27 DIAGNOSIS — Z79899 Other long term (current) drug therapy: Secondary | ICD-10-CM

## 2014-06-27 DIAGNOSIS — Z5181 Encounter for therapeutic drug level monitoring: Secondary | ICD-10-CM | POA: Diagnosis present

## 2014-06-27 DIAGNOSIS — M4802 Spinal stenosis, cervical region: Secondary | ICD-10-CM | POA: Diagnosis not present

## 2014-06-27 DIAGNOSIS — G5640 Causalgia of unspecified upper limb: Secondary | ICD-10-CM | POA: Insufficient documentation

## 2014-06-27 MED ORDER — DULOXETINE HCL 20 MG PO CPEP: 20.0000 mg | ORAL_CAPSULE | Freq: Every day | ORAL | Status: DC

## 2014-06-27 NOTE — Patient Instructions (Signed)
Call Dr March Rummage for re evaluation for potential pinched nerve in neck

## 2014-06-27 NOTE — Progress Notes (Signed)
Subjective:    Patient ID: Joan [name], female    DOB: 1966/08/02, 48 y.o.   MRN: [id number] Information obtained from patient as well as records from primary care physician and preferred pain management HPI CC:  Left arm pain  History of CRPS after hit Left elbow causing numbness in fingers 4 and 5 on left side.  Increasing weakness in left hand diagnosed with ulnar neuropathy by Local neurologist (Dr Leta Baptist) and then at [facility].  Underwent ulnar nerve release at [facility].   Seen by Rheumatologist no rheumatologic diagnosis.  Pain management eval , Dr Vira Blanco, stellate ganglion block 1st and second one helpful.  Third fourth and fifth stellate ganglion blocks not helpful.  Complains that pain medicine doesn't help. Has been on oxycodone 10 mg 2 tablets 3 times per day Tried fentanyl patches HEAG pain management, had spasms in body.  Seen by Neurosurgery evaled for cervical surgery Dr March Rummage, felt all problems related to CRPS Now feels that thumb and forefinger and Left shoulder getting numb.  Current Pain medications include: Oxycodone 20 mg 3 times per day Topiramate 100 mg twice a day Gabapentin 600 mg 3 times a day   Pain Inventory Average Pain 10 Pain Right Now 10 My pain is constant, sharp and aching  In the last 24 hours, has pain interfered with the following? General activity 10 Relation with others 10 Enjoyment of life 10 What TIME of day is your pain at its worst? all Sleep (in general) Poor  Pain is worse with: walking, bending, sitting, inactivity and standing Pain improves with: rest, heat/ice and medication Relief from Meds: 1  Mobility walk without assistance use a cane do you drive?  no  Function not employed: date last employed 06/04/13 disabled: date disabled applying I need assistance with the following:  bathing, toileting, meal prep, household duties and shopping  Neuro/Psych weakness numbness trouble walking depression anxiety  Prior  Studies Any changes since last visit?  no MRI CERVICAL SPINE WITHOUT CONTRAST   TECHNIQUE: Multiplanar, multisequence MR imaging was performed. No intravenous contrast was administered.   COMPARISON:  None.   FINDINGS: Mild reversal of the normal cervical lordosis. Slight disc space narrowing C5-C6 with end plate reactive changes. Otherwise unremarkable marrow signal intensity. Thornwaldt cyst nasopharynx. Flow voids maintained in the bilateral vertebral arteries. No neck masses. No evidence for chronic compression deformity. Flow voids are maintained in the bilateral vertebral arteries.   Abnormal cord signal dorsally at the C5-6 level on the right, discussed below. Otherwise normal cord size and signal.   The individual disc spaces were examined as follows:   C2-3:  Normal.   C3-4:  Normal.   C4-5:  Normal.   C5-6: Central disc protrusion with components extending more to the left than the right. Bony overgrowth with left greater than right uncinate spurring is suspected. Canal diameter 6 -7 mm. Left C6 nerve root impingement possible. Small foci of T2 hyperintensity without visible chronic hemorrhage are noted within the cord dorsally on the right. Given the relative stenosis at this level, these likely represent areas of ischemia related to stenosis. A demyelinating process, despite the peripheral location, is not favored. If there are signs and symptoms suggestive of multiple sclerosis however, MRI of the brain is recommended for further evaluation.   C6-7:  Shallow central protrusion.  No impingement.   C7-T1:  Negative.   IMPRESSION: Stenosis at C5-6 related to central protrusion with bony overgrowth. Possible left C6 nerve root impingement.  Canal stenosis measuring 6-7 mm AP diameter with abnormal cord signal on the right, likely chronic ischemia. See discussion above. Surgical consultation for possible decompression may be warranted.     Electronically  Signed   By: Rolla Flatten M.D.   On: 12/18/2013 17:53  Physicians involved in your care Any changes since last visit?  no Primary care Reese MD   Family History  Problem Relation Age of Onset  . Diabetes Sister   . Hypertension Sister   . Heart disease Sister   . Cancer Sister     brain tumor  . Heart disease Brother   . Diabetes Brother   . Cancer Maternal Aunt     uterine  . Cancer Maternal Grandmother     uterine  . Other Mother     car accident   History   Social History  . Marital Status: Married    Spouse Name: Mitzi Hansen    Number of Children: 4  . Years of Education: College   Occupational History  .      n/a   Social History Main Topics  . Smoking status: Current Every Day Smoker -- 1.00 packs/day for 26 years    Types: Cigarettes  . Smokeless tobacco: Never Used  . Alcohol Use: No  . Drug Use: No  . Sexual Activity: Yes    Birth Control/ Protection: None, Surgical     Comment: not at this time   Other Topics Concern  . None   Social History Narrative   Patient lives at home with family.   Caffeine Use: 6-7 cups daily   Past Surgical History  Procedure Laterality Date  . Tubal ligation    . Hernia repair      ventral hernia  . Ulnar nerve repair Left     [facility] 2013  . Dilitation & currettage/hystroscopy with thermachoice ablation N/A 08/17/2013    Procedure: DILATATION & CURETTAGE/HYSTEROSCOPY WITH THERMACHOICE ABLATION;  Surgeon: Jonnie Kind, MD;  Location: [facility];  Service: Gynecology;  Laterality: N/A;  18 ml D5W in & out; total therapy time= 9 minutes 33 seconds; temp= 87 degrees celcius  . Polypectomy N/A 08/17/2013    Procedure: REMOVAL ENDOMETRIAL POLYP;  Surgeon: Jonnie Kind, MD;  Location: [facility];  Service: Gynecology;  Laterality: N/A;   Past Medical History  Diagnosis Date  . Asthma   . Hypertension   . Collagen vascular disease     migraine  . Anxiety   . Anemia   . Hematuria 09/07/2013  . Complex regional pain syndrome    . Migraine    BP 128/80  Pulse 103  Resp 14  Ht 5\' 6"  (1.676 m)  Wt 190 lb (86.183 kg)  BMI 30.68 kg/m2  SpO2 100%  Opioid Risk Score: 0 Fall Risk Score: Low Fall Risk (0-5 points) (educated and handout given)  Review of Systems  Constitutional: Positive for chills, appetite change and unexpected weight change.  Respiratory: Positive for cough.   Gastrointestinal: Positive for nausea, vomiting and constipation.  Musculoskeletal: Positive for gait problem.  Neurological: Positive for weakness and numbness.  Psychiatric/Behavioral: Positive for dysphoric mood. The patient is nervous/anxious.   All other systems reviewed and are negative.      Objective:   Physical Exam  Nursing note and vitals reviewed. Constitutional: She appears well-developed and well-nourished.  HENT:  Head: Normocephalic.  Eyes: Conjunctivae and EOM are normal. Pupils are equal, round, and reactive to light.  Psychiatric: Her affect is labile.  cryting   reduced cervical range of motion with flexion and extension and rotation are intact without pain. Right upper extremity strength 5/5 in deltoid, biceps, triceps, grip Left upper extremity patient does not want to move it secondary to pain. Decreased sensation right C6, left C7 left C8, left C5  Motor strength is 5/5 bilateral Hip flexors knee extensors ankle dorsiflexor plantar flexor Atrophy in the left hand intrinsic muscles.  Ambulation is normal      Assessment & Plan:  1. 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 Has had severe pain despite narcotic and nonnarcotic pain management. Stellate ganglion blocks were initially helpful but no longer lasting relief. Has not gone through physical therapy thus far. We discussed that this is a difficult problem to treat and that medications do not offer a cure Discussed multimodal approach Discussed that she would need to be referred out for any further  stellate ganglion blocks or radiofrequency procedures or for spinal cord stimulation  2. Cervical stenosis no foraminal compression signs however Does have decreased sensation in the left C5 and left C7 dermatomes which would not be expected with her ulnar nerve problem. Recommend neurosurgical reevaluation.  Discussed with patient and husband Discussed that we need to check a urine drug screen prior to any narcotic analgesic prescriptions. Advised her to obtain 2 week supply of her current oxycodone dose from primary care physician Return to clinic 2 weeks with nurse practitioner. I'll see the patient in about 4-6 weeks

## 2014-06-27 NOTE — Addendum Note (Signed)
Addended by: Valeria Batman on: 06/27/2014 01:03 PM   Modules accepted: Orders

## 2014-06-29 ENCOUNTER — Telehealth: Payer: Self-pay | Admitting: *Deleted

## 2014-06-29 NOTE — Telephone Encounter (Signed)
She called stating the her PCP would not write an RX for the 2 week bridge and that she would not call you... PCP stated that you had to call her. Pt's last visit was 10/13.  PCP's # [phone]

## 2014-07-12 ENCOUNTER — Encounter: Payer: Self-pay | Admitting: Physical Medicine & Rehabilitation

## 2014-07-12 ENCOUNTER — Ambulatory Visit (HOSPITAL_BASED_OUTPATIENT_CLINIC_OR_DEPARTMENT_OTHER): Payer: BC Managed Care – PPO | Admitting: Physical Medicine & Rehabilitation

## 2014-07-12 VITALS — BP 123/67 | HR 107 | Resp 14 | Wt 201.6 lb

## 2014-07-12 DIAGNOSIS — G5642 Causalgia of left upper limb: Secondary | ICD-10-CM

## 2014-07-12 DIAGNOSIS — M4802 Spinal stenosis, cervical region: Secondary | ICD-10-CM

## 2014-07-12 DIAGNOSIS — Z5181 Encounter for therapeutic drug level monitoring: Secondary | ICD-10-CM | POA: Diagnosis not present

## 2014-07-12 MED ORDER — DULOXETINE HCL 30 MG PO CPEP: 30.0000 mg | ORAL_CAPSULE | Freq: Every day | ORAL | Status: DC

## 2014-07-12 NOTE — Progress Notes (Signed)
Subjective:    Patient ID: Joan [name], female    DOB: [birth year], 48 y.o.   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 Increasing weakness in left hand diagnosed with ulnar neuropathy by Local neurologist (Dr Leta Baptist) and then at [facility]. Underwent ulnar nerve release at [facility].  Seen by Rheumatologist no rheumatologic diagnosis.  Pain management eval , Dr Vira Blanco, stellate ganglion block 1st and second one helpful. Third fourth and fifth stellate ganglion blocks not helpful.  Complains that pain medicine doesn't help. Has been on oxycodone 10 mg 2 tablets 3 times per day  Tried fentanyl patches HEAG pain management, had spasms in body.  Seen by Neurosurgery evaled for cervical surgery Dr March Rummage, felt all problems related to CRPS  HPI  Patient was started on Cymbalta 20 mg per day last visit no side effects but no beneficial effects noted thus far. Primary physician prescribed oxycodone 10 mg #90 on 07/01/2014. She has finished the entire prescription. She states they were not very helpful either.  Urine drug screen from 06/27/2014 was reviewed and consistent.  Prior pain medications from preferred pain management included: Oxycodone 20 mg 3 times per day -Ran out of oxycodone yesterday Topiramate 100 mg twice a day -Currently taking Gabapentin 600 mg 3 times a day-Not currently taking   Pain Inventory Average Pain 10 Pain Right Now 10 My pain is constant and sharp  In the last 24 hours, has pain interfered with the following? General activity 10 Relation with others 10 Enjoyment of life 10 What TIME of day is your pain at its worst? all Sleep (in general) na  Pain is worse with: na Pain improves with: na Relief from Meds: na  Mobility how many minutes can you walk? 20 do you drive?  no  Function disabled: date disabled 03/08/2014 I need assistance with the following:  dressing, bathing, meal prep, household  duties and shopping  Neuro/Psych bowel control problems weakness numbness tremor spasms  Prior Studies Any changes since last visit?  no  Physicians involved in your care Any changes since last visit?  no   Family History  Problem Relation Age of Onset  . Diabetes Sister   . Hypertension Sister   . Heart disease Sister   . Cancer Sister     brain tumor  . Heart disease Brother   . Diabetes Brother   . Cancer Maternal Aunt     uterine  . Cancer Maternal Grandmother     uterine  . Other Mother     car accident   History   Social History  . Marital Status: Married    Spouse Name: Mitzi Hansen    Number of Children: 4  . Years of Education: College   Occupational History  .      n/a   Social History Main Topics  . Smoking status: Current Every Day Smoker -- 1.00 packs/day for 26 years    Types: Cigarettes  . Smokeless tobacco: Never Used  . Alcohol Use: No  . Drug Use: No  . Sexual Activity: Yes    Birth Control/ Protection: None, Surgical     Comment: not at this time   Other Topics Concern  . None   Social History Narrative   Patient lives at home with family.   Caffeine Use: 6-7 cups daily   Past Surgical History  Procedure Laterality Date  . Tubal ligation    .  Hernia repair      ventral hernia  . Ulnar nerve repair Left     [facility] 2013  . Dilitation & currettage/hystroscopy with thermachoice ablation N/A 08/17/2013    Procedure: DILATATION & CURETTAGE/HYSTEROSCOPY WITH THERMACHOICE ABLATION;  Surgeon: Jonnie Kind, MD;  Location: [facility];  Service: Gynecology;  Laterality: N/A;  18 ml D5W in & out; total therapy time= 9 minutes 33 seconds; temp= 87 degrees celcius  . Polypectomy N/A 08/17/2013    Procedure: REMOVAL ENDOMETRIAL POLYP;  Surgeon: Jonnie Kind, MD;  Location: [facility];  Service: Gynecology;  Laterality: N/A;   Past Medical History  Diagnosis Date  . Asthma   . Hypertension   . Collagen vascular disease     migraine  . Anxiety   .  Anemia   . Hematuria 09/07/2013  . Complex regional pain syndrome   . Migraine    BP 123/67  Pulse 107  Resp 14  Wt 201 lb 9.6 oz (91.445 kg)  SpO2 100%  Opioid Risk Score:   Fall Risk Score: Low Fall Risk (0-5 points) (previoulsy educated and given  handout) Review of Systems  Constitutional: Positive for fever, chills and appetite change.  Respiratory: Positive for shortness of breath.   Gastrointestinal: Positive for nausea, abdominal pain and constipation.  Neurological: Positive for tremors and weakness.       Tingling /shoulder and arm apain  All other systems reviewed and are negative.      Objective:   Physical Exam  Nursing note and vitals reviewed. Constitutional: She is oriented to person, place, and time. She appears well-developed and well-nourished.  HENT:  Head: Normocephalic and atraumatic.  Eyes: Conjunctivae are normal. Pupils are equal, round, and reactive to light.  Neurological: She is alert and oriented to person, place, and time.  Hypersensitivity to touch in the left hand. Atrophy in left hand intrinsic muscles Actively resists extension.  Normal sensation in bilateral C6-C7 and C8 dermatomes reduced in the left C5 dermatome  Psychiatric: Her affect is blunt. Her speech is delayed. She is slowed and withdrawn.  Protective of left arm          Assessment & Plan:  1. 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 Has had severe pain despite narcotic and nonnarcotic pain management. Stellate ganglion blocks were initially helpful but no longer lasting relief. Has not gone through physical therapy thus far.  We discussed that this is a difficult problem to treat and that medications do not offer a cure  Discussed multimodal approach  Discussed that she would need to be referred out for any further stellate ganglion blocks or radiofrequency procedures or for spinal cord stimulation  We discussed overusing  pain medications which she apparently did after her primary care prescribed 1-2 tablets 4-6 times per day.  We discussed trying a longer acting medicine in this case MS Contin 15 mg twice a day, May need to increase to 3 times a day next visit Recommend continued topiramate 100 mg twice a day Will increase Cymbalta to 30 mg per day, May need to increase to 60 mg next visit.   2. Cervical stenosis no foraminal compression signs however Does have decreased sensation in the left C5  dermatomes which would not be expected with her ulnar nerve problem.  Recommend neurosurgical reevaluation.

## 2014-07-18 ENCOUNTER — Encounter: Payer: Self-pay | Admitting: Physical Medicine & Rehabilitation

## 2014-07-20 ENCOUNTER — Telehealth: Payer: Self-pay | Admitting: *Deleted

## 2014-07-20 NOTE — Telephone Encounter (Signed)
Patient called office - left message on clinical line.  RX is not working. In a terrible amount of pain.  Asked if Doctor would be willing to increase meds as per discussion at last office visit MS Contin 15 mg bid.

## 2014-07-21 MED ORDER — MORPHINE SULFATE ER 15 MG PO TBCR: 15.0000 mg | EXTENDED_RELEASE_TABLET | Freq: Three times a day (TID) | ORAL | Status: DC | PRN

## 2014-07-21 MED ORDER — MORPHINE SULFATE ER 15 MG PO TBCR: 15.0000 mg | EXTENDED_RELEASE_TABLET | Freq: Two times a day (BID) | ORAL | Status: DC

## 2014-07-21 NOTE — Telephone Encounter (Signed)
I spoke with Joan [name] and she did receive a hand written rx for Joan Contin 15 mg bid.  I told her to increase to tid and when she runs out we will have a new rx for Joan Contin 15 mg TID #90available for pick up.  She must bring license with her.

## 2014-07-21 NOTE — Telephone Encounter (Signed)
It looks like the MS Contin was never started only discussed so I will print rx for MS Contin 15 mg BID. (not for TID)

## 2014-07-21 NOTE — Telephone Encounter (Signed)
May increase MS Contin CR to 15mg  TID

## 2014-07-21 NOTE — Telephone Encounter (Signed)
I think it was on the day of epic failure.  I think I wrote on a prescription pad.  See if the carbon copy is around

## 2014-07-25 ENCOUNTER — Telehealth: Payer: Self-pay | Admitting: *Deleted

## 2014-07-25 NOTE — Telephone Encounter (Addendum)
3 calls rec'd today from pt, Each call is essentially the same,  The meds she has been prescribed are not helping her pain. She would really like to get her oxycodone prescription restored

## 2014-07-27 NOTE — Telephone Encounter (Signed)
May increase MSO4 to 30mg  BID Needs f/u appt sooner

## 2014-07-28 NOTE — Telephone Encounter (Signed)
[place] called and I told her about the increase per [place].  She is going to be out and never picked up the rx written 07/21/14 for 15 mg MsContin tid.  I have put her on the schedule for Dr Letta Pate tomorrow at 11:15.  I will destroy the rx never picked up for the 15 mg #90.

## 2014-07-29 ENCOUNTER — Ambulatory Visit (HOSPITAL_BASED_OUTPATIENT_CLINIC_OR_DEPARTMENT_OTHER): Payer: BC Managed Care – PPO | Admitting: Physical Medicine & Rehabilitation

## 2014-07-29 ENCOUNTER — Encounter: Payer: Self-pay | Admitting: Physical Medicine & Rehabilitation

## 2014-07-29 ENCOUNTER — Encounter: Payer: BC Managed Care – PPO | Attending: Physical Medicine & Rehabilitation

## 2014-07-29 VITALS — BP 132/81 | HR 98 | Resp 14 | Wt 199.2 lb

## 2014-07-29 DIAGNOSIS — G5642 Causalgia of left upper limb: Secondary | ICD-10-CM

## 2014-07-29 MED ORDER — DULOXETINE HCL 60 MG PO CPEP: 60.0000 mg | ORAL_CAPSULE | Freq: Every day | ORAL | Status: DC

## 2014-07-29 MED ORDER — MORPHINE SULFATE ER 30 MG PO TBCR: 30.0000 mg | EXTENDED_RELEASE_TABLET | Freq: Two times a day (BID) | ORAL | Status: DC

## 2014-07-29 NOTE — Patient Instructions (Signed)
Morphine increased to 30 mg twice a day  Cymbalta increased to 60 mg a day  Continue topiramate 100 mg twice a day  See you in one month  Tried to move the left shoulder elbow wrist and hand as much as possible Tried to gently rub the arm and hand several times per day to desensitize it

## 2014-07-29 NOTE — Progress Notes (Signed)
Subjective:    Patient ID: Joan [name], female    DOB: [birth year], 48 y.o.   MRN: [id number]  HPI No significant relief with morphine 15 mg however up to 30 mg it seemed to work better. Patient concerned about morphine versus oxycodone. We discussed that they are similar medications. On topiramate 100 mg twice a day On Cymbalta 30 mg per day, feels better on this. Pain Inventory Average Pain 10 Pain Right Now 8 My pain is sharp, burning and stabbing  In the last 24 hours, has pain interfered with the following? General activity 0 Relation with others 4 Enjoyment of life 0 What TIME of day is your pain at its worst? all Sleep (in general) Poor  Pain is worse with: walking, bending, sitting, inactivity, standing and some activites Pain improves with: medication Relief from Meds: 5  Mobility walk without assistance use a cane how many minutes can you walk? 3 ability to climb steps?  yes do you drive?  yes  Function disabled: date disabled in process I need assistance with the following:  meal prep, household duties and shopping  Neuro/Psych weakness numbness confusion depression anxiety  Prior Studies Any changes since last visit?  no  Physicians involved in your care Any changes since last visit?  no   Family History  Problem Relation Age of Onset  . Diabetes Sister   . Hypertension Sister   . Heart disease Sister   . Cancer Sister     brain tumor  . Heart disease Brother   . Diabetes Brother   . Cancer Maternal Aunt     uterine  . Cancer Maternal Grandmother     uterine  . Other Mother     car accident   History   Social History  . Marital Status: Married    Spouse Name: Mitzi Hansen    Number of Children: 4  . Years of Education: College   Occupational History  .      n/a   Social History Main Topics  . Smoking status: Current Every Day Smoker -- 1.00 packs/day for 26 years    Types: Cigarettes  . Smokeless tobacco: Never Used  .  Alcohol Use: No  . Drug Use: No  . Sexual Activity: Yes    Birth Control/ Protection: None, Surgical     Comment: not at this time   Other Topics Concern  . None   Social History Narrative   Patient lives at home with family.   Caffeine Use: 6-7 cups daily   Past Surgical History  Procedure Laterality Date  . Tubal ligation    . Hernia repair      ventral hernia  . Ulnar nerve repair Left     [facility] 2013  . Dilitation & currettage/hystroscopy with thermachoice ablation N/A 08/17/2013    Procedure: DILATATION & CURETTAGE/HYSTEROSCOPY WITH THERMACHOICE ABLATION;  Surgeon: Jonnie Kind, MD;  Location: [facility];  Service: Gynecology;  Laterality: N/A;  18 ml D5W in & out; total therapy time= 9 minutes 33 seconds; temp= 87 degrees celcius  . Polypectomy N/A 08/17/2013    Procedure: REMOVAL ENDOMETRIAL POLYP;  Surgeon: Jonnie Kind, MD;  Location: [facility];  Service: Gynecology;  Laterality: N/A;   Past Medical History  Diagnosis Date  . Asthma   . Hypertension   . Collagen vascular disease     migraine  . Anxiety   . Anemia   . Hematuria 09/07/2013  . Complex regional pain syndrome   .  Migraine    BP 132/81 mmHg  Pulse 98  Resp 14  Wt 199 lb 3.2 oz (90.357 kg)  SpO2 99%  Opioid Risk Score:   Fall Risk Score: Low Fall Risk (0-5 points) (previoulsy educated and given the handout)  Review of Systems  Constitutional: Positive for chills.  Neurological: Positive for weakness.  Psychiatric/Behavioral: Positive for confusion and dysphoric mood. The patient is nervous/anxious.   All other systems reviewed and are negative.      Objective:   Physical Exam  Constitutional: She is oriented to person, place, and time. She appears well-developed and well-nourished.  HENT:  Head: Normocephalic and atraumatic.  Neurological: She is alert and oriented to person, place, and time.  Psychiatric: She has a normal mood and affect.  Nursing note and vitals reviewed.   Guarding left  upper extremity. Tolerates light touch to left hand but not to elbow and shoulder area. Self limiting shoulder elbow wrist and finger range of motion related to pain. Does not allow for passive range of motion. Mood and affect withdrawn occasionally smiles.       Assessment & Plan:  1. 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 Has had severe pain despite narcotic and nonnarcotic pain management. Stellate ganglion blocks were initially helpful but no longer lasting relief. Has not gone through physical therapy thus far. I don't think she can tolerate this at this point We'll try to optimize pain medication regimen prior to referral to PT We discussed that this is a difficult problem to treat and that medications do not offer a cure    Recommend continued topiramate 100 mg twice a day  Will increase Cymbalta to 60 mg per day, Increase MS Contin CR to 30 mA twice a day  Discussed treatment plan she agrees

## 2014-08-08 ENCOUNTER — Ambulatory Visit: Payer: BC Managed Care – PPO | Admitting: Physical Medicine & Rehabilitation

## 2014-08-09 ENCOUNTER — Telehealth: Payer: Self-pay | Admitting: *Deleted

## 2014-08-09 NOTE — Telephone Encounter (Signed)
May increase to 30 mg 3 times a day, follow-up with Zella Ball in 2 weeks since she will be running out of medicine early

## 2014-08-09 NOTE — Telephone Encounter (Signed)
Notified [place] she can take her med three times a day and I will notify front office to arrange an appointment in 2 weeks with Zella Ball per Dr Letta Pate

## 2014-08-09 NOTE — Telephone Encounter (Signed)
Pt calling again, complaining that her meds are not working, 30 mg morphine twice a day. Even with muscle relaxers, after 5 hours pain becomes unbearable, pt states 'just can't keep going with 1 tab every 12 hours'

## 2014-08-10 NOTE — Telephone Encounter (Signed)
It needs to be changed--she will be out of meds by 08/21/14 because we increased the number she can take.  So latest appt would be early am 08/22/14 or before.

## 2014-08-10 NOTE — Telephone Encounter (Signed)
Pt has a two appointment already with Dr. Raliegh Ip on 12.11.2015.Marland KitchenMarland Kitchen

## 2014-08-17 NOTE — Telephone Encounter (Signed)
Pt has an appt. 12/07/ 2015

## 2014-08-22 ENCOUNTER — Ambulatory Visit (HOSPITAL_BASED_OUTPATIENT_CLINIC_OR_DEPARTMENT_OTHER): Payer: BC Managed Care – PPO | Admitting: Physical Medicine & Rehabilitation

## 2014-08-22 ENCOUNTER — Other Ambulatory Visit: Payer: Self-pay | Admitting: Physical Medicine & Rehabilitation

## 2014-08-22 ENCOUNTER — Encounter: Payer: Self-pay | Admitting: Physical Medicine & Rehabilitation

## 2014-08-22 ENCOUNTER — Encounter: Payer: BC Managed Care – PPO | Attending: Physical Medicine & Rehabilitation

## 2014-08-22 VITALS — BP 119/80 | HR 85 | Resp 14 | Wt 195.8 lb

## 2014-08-22 DIAGNOSIS — G5642 Causalgia of left upper limb: Secondary | ICD-10-CM | POA: Diagnosis present

## 2014-08-22 DIAGNOSIS — Z5181 Encounter for therapeutic drug level monitoring: Secondary | ICD-10-CM

## 2014-08-22 DIAGNOSIS — Z79899 Other long term (current) drug therapy: Secondary | ICD-10-CM

## 2014-08-22 MED ORDER — OXYCODONE HCL 15 MG PO TABS: 15.0000 mg | ORAL_TABLET | Freq: Three times a day (TID) | ORAL | Status: DC | PRN

## 2014-08-22 NOTE — Patient Instructions (Signed)
May discontinue Morphine May start Oxycodone today , do not exceed 1 tablet , 3 times a day

## 2014-08-22 NOTE — Progress Notes (Signed)
Subjective:    Patient ID: Joan [name], female    DOB: Jun 09, 1966, 48 y.o.   MRN: [id number]  HPI 48 year old female with hit [address] of complex regional pain syndrome type II mainly the left ulnar nerve distribution. Has tried several pain medications in the past was at another pain clinic. Did not do well with fentanyl patch, head some moderate relief with oxycodone short acting but not oxycodone long-acting. Most recently he has tried morphine long-acting which was not very helpful for her. The Cymbalta 60 mg once a day has helped reduce sensitivity left hand. Patient states that with the oxycodone in the past she was able to open her fingers more. Pain Inventory Average Pain 10 Pain Right Now 7 My pain is constant  In the last 24 hours, has pain interfered with the following? General activity 2 Relation with others 2 Enjoyment of life 0 What TIME of day is your pain at its worst? all Sleep (in general) Poor  Pain is worse with: walking, bending, sitting, inactivity, standing and some activites Pain improves with: medication Relief from Meds: 4  Mobility walk without assistance how many minutes can you walk? 1-3 do you drive?  no  Function Do you have any goals in this area?  no  Neuro/Psych bowel control problems numbness tingling spasms dizziness  Prior Studies Any changes since last visit?  no  Physicians involved in your care Any changes since last visit?  no   Family History  Problem Relation Age of Onset  . Diabetes Sister   . Hypertension Sister   . Heart disease Sister   . Cancer Sister     brain tumor  . Heart disease Brother   . Diabetes Brother   . Cancer Maternal Aunt     uterine  . Cancer Maternal Grandmother     uterine  . Other Mother     car accident   History   Social History  . Marital Status: Married    Spouse Name: Mitzi Hansen    Number of Children: 4  . Years of Education: College   Occupational History  .      n/a    Social History Main Topics  . Smoking status: Current Every Day Smoker -- 1.00 packs/day for 26 years    Types: Cigarettes  . Smokeless tobacco: Never Used  . Alcohol Use: No  . Drug Use: No  . Sexual Activity: Yes    Birth Control/ Protection: None, Surgical     Comment: not at this time   Other Topics Concern  . None   Social History Narrative   Patient lives at home with family.   Caffeine Use: 6-7 cups daily   Past Surgical History  Procedure Laterality Date  . Tubal ligation    . Hernia repair      ventral hernia  . Ulnar nerve repair Left     [facility] 2013  . Dilitation & currettage/hystroscopy with thermachoice ablation N/A 08/17/2013    Procedure: DILATATION & CURETTAGE/HYSTEROSCOPY WITH THERMACHOICE ABLATION;  Surgeon: Jonnie Kind, MD;  Location: [facility];  Service: Gynecology;  Laterality: N/A;  18 ml D5W in & out; total therapy time= 9 minutes 33 seconds; temp= 87 degrees celcius  . Polypectomy N/A 08/17/2013    Procedure: REMOVAL ENDOMETRIAL POLYP;  Surgeon: Jonnie Kind, MD;  Location: [facility];  Service: Gynecology;  Laterality: N/A;   Past Medical History  Diagnosis Date  . Asthma   . Hypertension   .  Collagen vascular disease     migraine  . Anxiety   . Anemia   . Hematuria 09/07/2013  . Complex regional pain syndrome   . Migraine    BP 119/80 mmHg  Pulse 85  Resp 14  Wt 195 lb 12.8 oz (88.814 kg)  SpO2 99%  Opioid Risk Score:   Fall Risk Score: Moderate Fall Risk (6-13 points) (previoulsy educated and given handout)  Review of Systems  Constitutional: Positive for appetite change and unexpected weight change.  Respiratory: Positive for shortness of breath and wheezing.   Gastrointestinal: Positive for nausea, abdominal pain and constipation.  Musculoskeletal: Positive for myalgias, gait problem and neck pain.       Spasms  Neurological: Positive for dizziness and numbness.       Tingling  All other systems reviewed and are negative.       Objective:   Physical Exam  Constitutional: She appears well-developed and well-nourished.  HENT:  Head: Normocephalic and atraumatic.  Eyes: Conjunctivae are normal. Pupils are equal, round, and reactive to light.  Neurological:  Decreased hypersensitivity to light touch in the left hand  Minimal extension at MCP and PIPs Hand held mainly in a semiflexed posture Patient did not try to lift up her arm stated it was too painful  Psychiatric: Her affect is labile.  Nursing note and vitals reviewed.         Assessment & Plan:  1. CRPS type II. Mainly affecting ulnar nerve distribution. Stellate gangrene blocks have not produced much lasting relief. Has not tried PT yet. Do not feel pain is under adequate control to try PT yet. We'll switch MS in 10 to oxycodone immediate release 15 mg 3 times per day. Check urine drug screen. Continue topiramate 100 mg twice a day Continue Cymbalta 60 mg per day

## 2014-08-26 ENCOUNTER — Ambulatory Visit: Payer: BC Managed Care – PPO | Admitting: Physical Medicine & Rehabilitation

## 2014-08-30 ENCOUNTER — Encounter: Payer: Self-pay | Admitting: Physical Medicine & Rehabilitation

## 2014-08-30 NOTE — Progress Notes (Signed)
Urine drug screen for this encounter is inconsistent.  She has been taking morphine only and is very positive for oxycodone as well.  Due to her constant calls for more medication because what she is taking is not helping for which we increased her morphine-per Dr Letta Pate she is to be discharged for taking non prescribed medication.

## 2014-08-31 ENCOUNTER — Telehealth: Payer: Self-pay | Admitting: *Deleted

## 2014-08-31 NOTE — Telephone Encounter (Signed)
Joan [name] was notified of her discharge from the clinic due to having unprescribed oxycodone in her urine drug screen.  A letter will be mailed with discharge letter and area pain clinics that she will need to discuss with PCP for referral.

## 2014-09-20 ENCOUNTER — Ambulatory Visit: Payer: BC Managed Care – PPO | Admitting: Physical Medicine & Rehabilitation

## 2014-09-27 ENCOUNTER — Ambulatory Visit: Payer: Self-pay | Admitting: Diagnostic Neuroimaging

## 2014-10-28 ENCOUNTER — Emergency Department (HOSPITAL_COMMUNITY)
Admission: EM | Admit: 2014-10-28 | Discharge: 2014-10-28 | Disposition: A | Discharge status: Home / Self Care | Payer: BLUE CROSS/BLUE SHIELD | Attending: Emergency Medicine | Admitting: Emergency Medicine

## 2014-10-28 ENCOUNTER — Encounter (HOSPITAL_COMMUNITY): Payer: Self-pay | Admitting: Emergency Medicine

## 2014-10-28 DIAGNOSIS — J45909 Unspecified asthma, uncomplicated: Secondary | ICD-10-CM | POA: Diagnosis not present

## 2014-10-28 DIAGNOSIS — Z79899 Other long term (current) drug therapy: Secondary | ICD-10-CM | POA: Diagnosis not present

## 2014-10-28 DIAGNOSIS — F419 Anxiety disorder, unspecified: Secondary | ICD-10-CM | POA: Diagnosis not present

## 2014-10-28 DIAGNOSIS — G8929 Other chronic pain: Secondary | ICD-10-CM | POA: Diagnosis not present

## 2014-10-28 DIAGNOSIS — Z720 Tobacco use: Secondary | ICD-10-CM | POA: Insufficient documentation

## 2014-10-28 DIAGNOSIS — G43909 Migraine, unspecified, not intractable, without status migrainosus: Secondary | ICD-10-CM | POA: Diagnosis not present

## 2014-10-28 DIAGNOSIS — K219 Gastro-esophageal reflux disease without esophagitis: Secondary | ICD-10-CM | POA: Insufficient documentation

## 2014-10-28 DIAGNOSIS — G40909 Epilepsy, unspecified, not intractable, without status epilepticus: Secondary | ICD-10-CM | POA: Insufficient documentation

## 2014-10-28 DIAGNOSIS — I10 Essential (primary) hypertension: Secondary | ICD-10-CM | POA: Insufficient documentation

## 2014-10-28 DIAGNOSIS — K589 Irritable bowel syndrome without diarrhea: Secondary | ICD-10-CM | POA: Insufficient documentation

## 2014-10-28 DIAGNOSIS — Z862 Personal history of diseases of the blood and blood-forming organs and certain disorders involving the immune mechanism: Secondary | ICD-10-CM | POA: Diagnosis not present

## 2014-10-28 DIAGNOSIS — M549 Dorsalgia, unspecified: Secondary | ICD-10-CM | POA: Diagnosis present

## 2014-10-28 HISTORY — DX: Paresthesia of skin: R20.2

## 2014-10-28 HISTORY — DX: Gastro-esophageal reflux disease without esophagitis: K21.9

## 2014-10-28 HISTORY — DX: Irritable bowel syndrome, unspecified: K58.9

## 2014-10-28 HISTORY — DX: Cervicalgia: M54.2

## 2014-10-28 HISTORY — DX: Other chronic pain: G89.29

## 2014-10-28 HISTORY — DX: Dorsalgia, unspecified: M54.9

## 2014-10-28 MED ORDER — DIAZEPAM 5 MG/ML IJ SOLN
5.0000 mg | Freq: Once | INTRAMUSCULAR | Status: AC
  Administered 2014-10-28: 5 mg via INTRAVENOUS
  Filled 2014-10-28: qty 2

## 2014-10-28 MED ORDER — HYDROMORPHONE HCL 1 MG/ML IJ SOLN
1.0000 mg | Freq: Once | INTRAMUSCULAR | Status: AC
  Administered 2014-10-28: 1 mg via INTRAVENOUS
  Filled 2014-10-28: qty 1

## 2014-10-28 MED ORDER — DIAZEPAM 5 MG PO TABS: 5.0000 mg | ORAL_TABLET | Freq: Once | ORAL | Status: DC

## 2014-10-28 NOTE — Discharge Instructions (Signed)
Please follow up with your primary care physician. Return to the ED for new concerns.

## 2014-10-28 NOTE — ED Notes (Signed)
Pt has chronic left extremity pain and back pain, sees pain management PCP and recently medication changed from morphine to percocet. EMS noted pt c/o back pain and left arm pain with "spasms". NAD noted. Pt screaming and yelling as EMS brought pt back .

## 2014-10-28 NOTE — ED Notes (Addendum)
Pt wheeled out by nurse tech, calm and cooperative. Pt in NAD
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
## 2014-10-28 NOTE — ED Notes (Signed)
Pt thrashing in bed, screaming, crying, shaking entire body.  Vitals remain stable the entire time
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
## 2014-11-18 DIAGNOSIS — Z0289 Encounter for other administrative examinations: Secondary | ICD-10-CM

## 2014-12-02 DIAGNOSIS — Z0289 Encounter for other administrative examinations: Secondary | ICD-10-CM

## 2014-12-06 ENCOUNTER — Encounter (HOSPITAL_COMMUNITY): Payer: Self-pay

## 2014-12-06 ENCOUNTER — Emergency Department (HOSPITAL_COMMUNITY)
Admission: EM | Admit: 2014-12-06 | Discharge: 2014-12-06 | Disposition: A | Discharge status: Home / Self Care | Payer: BLUE CROSS/BLUE SHIELD | Attending: Emergency Medicine | Admitting: Emergency Medicine

## 2014-12-06 ENCOUNTER — Emergency Department (HOSPITAL_COMMUNITY): Payer: BLUE CROSS/BLUE SHIELD

## 2014-12-06 DIAGNOSIS — J45909 Unspecified asthma, uncomplicated: Secondary | ICD-10-CM | POA: Diagnosis not present

## 2014-12-06 DIAGNOSIS — S134XXA Sprain of ligaments of cervical spine, initial encounter: Secondary | ICD-10-CM | POA: Diagnosis not present

## 2014-12-06 DIAGNOSIS — Y9389 Activity, other specified: Secondary | ICD-10-CM | POA: Insufficient documentation

## 2014-12-06 DIAGNOSIS — Z720 Tobacco use: Secondary | ICD-10-CM | POA: Diagnosis not present

## 2014-12-06 DIAGNOSIS — S3992XA Unspecified injury of lower back, initial encounter: Secondary | ICD-10-CM | POA: Insufficient documentation

## 2014-12-06 DIAGNOSIS — F419 Anxiety disorder, unspecified: Secondary | ICD-10-CM | POA: Diagnosis not present

## 2014-12-06 DIAGNOSIS — Y998 Other external cause status: Secondary | ICD-10-CM | POA: Insufficient documentation

## 2014-12-06 DIAGNOSIS — G43909 Migraine, unspecified, not intractable, without status migrainosus: Secondary | ICD-10-CM | POA: Diagnosis not present

## 2014-12-06 DIAGNOSIS — I10 Essential (primary) hypertension: Secondary | ICD-10-CM | POA: Insufficient documentation

## 2014-12-06 DIAGNOSIS — M546 Pain in thoracic spine: Secondary | ICD-10-CM

## 2014-12-06 DIAGNOSIS — G8929 Other chronic pain: Secondary | ICD-10-CM | POA: Insufficient documentation

## 2014-12-06 DIAGNOSIS — Z8719 Personal history of other diseases of the digestive system: Secondary | ICD-10-CM | POA: Diagnosis not present

## 2014-12-06 DIAGNOSIS — Y92410 Unspecified street and highway as the place of occurrence of the external cause: Secondary | ICD-10-CM | POA: Insufficient documentation

## 2014-12-06 DIAGNOSIS — Z862 Personal history of diseases of the blood and blood-forming organs and certain disorders involving the immune mechanism: Secondary | ICD-10-CM | POA: Diagnosis not present

## 2014-12-06 DIAGNOSIS — S139XXA Sprain of joints and ligaments of unspecified parts of neck, initial encounter: Secondary | ICD-10-CM

## 2014-12-06 DIAGNOSIS — Z9851 Tubal ligation status: Secondary | ICD-10-CM | POA: Diagnosis not present

## 2014-12-06 DIAGNOSIS — Z79899 Other long term (current) drug therapy: Secondary | ICD-10-CM | POA: Insufficient documentation

## 2014-12-06 MED ORDER — MORPHINE SULFATE 4 MG/ML IJ SOLN
4.0000 mg | Freq: Once | INTRAMUSCULAR | Status: AC
  Administered 2014-12-06: 4 mg via INTRAMUSCULAR
  Filled 2014-12-06: qty 1

## 2014-12-06 MED ORDER — KETOROLAC TROMETHAMINE 30 MG/ML IJ SOLN
60.0000 mg | Freq: Once | INTRAMUSCULAR | Status: AC
  Administered 2014-12-06: 60 mg via INTRAMUSCULAR
  Filled 2014-12-06: qty 2
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
## 2014-12-06 NOTE — ED Notes (Signed)
Patient transported to X-ray 

## 2014-12-06 NOTE — ED Notes (Signed)
Pt. returned from XR. 
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
## 2014-12-06 NOTE — ED Notes (Signed)
MD at bedside. 

## 2015-01-04 ENCOUNTER — Ambulatory Visit: Payer: Self-pay | Admitting: Diagnostic Neuroimaging

## 2015-01-11 ENCOUNTER — Ambulatory Visit: Payer: Self-pay | Admitting: Diagnostic Neuroimaging

## 2015-01-12 ENCOUNTER — Encounter: Payer: Self-pay | Admitting: Diagnostic Neuroimaging

## 2015-03-07 DIAGNOSIS — Z0289 Encounter for other administrative examinations: Secondary | ICD-10-CM

## 2015-03-20 ENCOUNTER — Encounter (HOSPITAL_COMMUNITY): Payer: Self-pay

## 2015-03-20 ENCOUNTER — Emergency Department (HOSPITAL_COMMUNITY)
Admission: EM | Admit: 2015-03-20 | Discharge: 2015-03-21 | Disposition: A | Discharge status: Home / Self Care | Payer: BLUE CROSS/BLUE SHIELD | Attending: Emergency Medicine | Admitting: Emergency Medicine

## 2015-03-20 DIAGNOSIS — Z720 Tobacco use: Secondary | ICD-10-CM | POA: Insufficient documentation

## 2015-03-20 DIAGNOSIS — Z3202 Encounter for pregnancy test, result negative: Secondary | ICD-10-CM | POA: Diagnosis not present

## 2015-03-20 DIAGNOSIS — G8929 Other chronic pain: Secondary | ICD-10-CM | POA: Diagnosis not present

## 2015-03-20 DIAGNOSIS — R1031 Right lower quadrant pain: Secondary | ICD-10-CM | POA: Insufficient documentation

## 2015-03-20 DIAGNOSIS — Z9889 Other specified postprocedural states: Secondary | ICD-10-CM | POA: Insufficient documentation

## 2015-03-20 DIAGNOSIS — I10 Essential (primary) hypertension: Secondary | ICD-10-CM | POA: Diagnosis not present

## 2015-03-20 DIAGNOSIS — R109 Unspecified abdominal pain: Secondary | ICD-10-CM

## 2015-03-20 DIAGNOSIS — Z8719 Personal history of other diseases of the digestive system: Secondary | ICD-10-CM | POA: Insufficient documentation

## 2015-03-20 DIAGNOSIS — Z862 Personal history of diseases of the blood and blood-forming organs and certain disorders involving the immune mechanism: Secondary | ICD-10-CM | POA: Diagnosis not present

## 2015-03-20 DIAGNOSIS — J45909 Unspecified asthma, uncomplicated: Secondary | ICD-10-CM | POA: Diagnosis not present

## 2015-03-20 DIAGNOSIS — F419 Anxiety disorder, unspecified: Secondary | ICD-10-CM | POA: Insufficient documentation

## 2015-03-20 DIAGNOSIS — Z79899 Other long term (current) drug therapy: Secondary | ICD-10-CM | POA: Insufficient documentation

## 2015-03-20 MED ORDER — ONDANSETRON HCL 4 MG/2ML IJ SOLN
4.0000 mg | Freq: Once | INTRAMUSCULAR | Status: AC
  Administered 2015-03-21: 4 mg via INTRAVENOUS
  Filled 2015-03-20: qty 2

## 2015-03-20 MED ORDER — SODIUM CHLORIDE 0.9 % IV BOLUS (SEPSIS)
1000.0000 mL | Freq: Once | INTRAVENOUS | Status: AC
  Administered 2015-03-21: 1000 mL via INTRAVENOUS

## 2015-03-20 MED ORDER — HYDROMORPHONE HCL 1 MG/ML IJ SOLN
1.0000 mg | Freq: Once | INTRAMUSCULAR | Status: AC
  Administered 2015-03-21: 1 mg via INTRAVENOUS
  Filled 2015-03-20: qty 1

## 2015-03-20 NOTE — ED Notes (Signed)
Pt was given Fentanyl 259mcg and Zofran 8mg  en route by EMS.

## 2015-03-20 NOTE — ED Notes (Signed)
Bed: WA02 Expected date:  Expected time:  Means of arrival:  Comments: EMS 49 yo female right lower abdominal pain

## 2015-03-20 NOTE — ED Notes (Signed)
Pt c/o right side abdominal pain radiating to right flank x 2days.  Pt describes pain as stabbing and intermittent.  Pt also c/o N/V/D.  Pt states she has been dry heaving today.  Pt unable to tolerate and PO intake.  Pt denies urinary complaints.

## 2015-03-21 ENCOUNTER — Encounter (HOSPITAL_COMMUNITY): Payer: Self-pay

## 2015-03-21 ENCOUNTER — Emergency Department (HOSPITAL_COMMUNITY): Payer: BLUE CROSS/BLUE SHIELD

## 2015-03-21 LAB — CBC WITH DIFFERENTIAL/PLATELET
Basophils Absolute: 0 10*3/uL (ref 0.0–0.1)
Basophils Relative: 0 % (ref 0–1)
Eosinophils Absolute: 0.2 10*3/uL (ref 0.0–0.7)
Eosinophils Relative: 5 % (ref 0–5)
HCT: 36 % (ref 36.0–46.0)
Hemoglobin: 12 g/dL (ref 12.0–15.0)
Lymphocytes Relative: 32 % (ref 12–46)
Lymphs Abs: 1.6 10*3/uL (ref 0.7–4.0)
MCH: 27.6 pg (ref 26.0–34.0)
MCHC: 33.3 g/dL (ref 30.0–36.0)
MCV: 82.8 fL (ref 78.0–100.0)
Monocytes Absolute: 0.2 10*3/uL (ref 0.1–1.0)
Monocytes Relative: 4 % (ref 3–12)
Neutro Abs: 2.9 10*3/uL (ref 1.7–7.7)
Neutrophils Relative %: 59 % (ref 43–77)
Platelets: 257 10*3/uL (ref 150–400)
RBC: 4.35 MIL/uL (ref 3.87–5.11)
RDW: 13 % (ref 11.5–15.5)
WBC: 4.9 10*3/uL (ref 4.0–10.5)

## 2015-03-21 LAB — COMPREHENSIVE METABOLIC PANEL
ALT: 20 U/L (ref 14–54)
AST: 18 U/L (ref 15–41)
Albumin: 3.8 g/dL (ref 3.5–5.0)
Alkaline Phosphatase: 57 U/L (ref 38–126)
Anion gap: 6 (ref 5–15)
BUN: 10 mg/dL (ref 6–20)
CO2: 27 mmol/L (ref 22–32)
Calcium: 8.7 mg/dL — ABNORMAL LOW (ref 8.9–10.3)
Chloride: 105 mmol/L (ref 101–111)
Creatinine, Ser: 0.75 mg/dL (ref 0.44–1.00)
GFR calc Af Amer: >60 mL/min (ref >60)
GFR calc non Af Amer: >60 mL/min (ref >60)
Glucose, Bld: 102 mg/dL — ABNORMAL HIGH (ref 65–99)
Potassium: 2.9 mmol/L — ABNORMAL LOW (ref 3.5–5.1)
Sodium: 138 mmol/L (ref 135–145)
Total Bilirubin: 0.8 mg/dL (ref 0.3–1.2)
Total Protein: 7.1 g/dL (ref 6.5–8.1)

## 2015-03-21 LAB — URINALYSIS, ROUTINE W REFLEX MICROSCOPIC
Bilirubin Urine: NEGATIVE
Glucose, UA: NEGATIVE mg/dL
Hgb urine dipstick: NEGATIVE
Ketones, ur: NEGATIVE mg/dL
Leukocytes, UA: NEGATIVE
Nitrite: NEGATIVE
Protein, ur: NEGATIVE mg/dL
Specific Gravity, Urine: 1.008 (ref 1.005–1.030)
Urobilinogen, UA: 0.2 mg/dL (ref 0.0–1.0)
pH: 7 (ref 5.0–8.0)

## 2015-03-21 LAB — POC URINE PREG, ED: Preg Test, Ur: NEGATIVE

## 2015-03-21 LAB — LIPASE, BLOOD: Lipase: 10 U/L — ABNORMAL LOW (ref 22–51)

## 2015-03-21 MED ORDER — OXYCODONE-ACETAMINOPHEN 5-325 MG PO TABS: 1.0000 | ORAL_TABLET | ORAL | Status: DC | PRN

## 2015-03-21 MED ORDER — IOHEXOL 300 MG/ML  SOLN
100.0000 mL | Freq: Once | INTRAMUSCULAR | Status: AC | PRN
  Administered 2015-03-21: 100 mL via INTRAVENOUS

## 2015-03-21 MED ORDER — IOHEXOL 300 MG/ML  SOLN
25.0000 mL | Freq: Once | INTRAMUSCULAR | Status: AC | PRN
  Administered 2015-03-21: 25 mL via ORAL

## 2015-03-21 NOTE — ED Provider Notes (Signed)
H./o IBS, chronic pain C/o intermittent abdom pain, diarrhea, vomiting - no fever, bloody emesis/diarrhea CT pending, labs pending (no leukocytosis)  CT without acute finding. Discussed results with the patient. Gave #15 Percocet for home use until she can see her doctor for regular medications. She reports she is ready for discharge home.   Charlann Lange, PA-C 03/21/15 [place], DO 03/21/15 [phone]

## 2015-03-21 NOTE — Discharge Instructions (Signed)

## 2015-03-21 NOTE — ED Provider Notes (Signed)
CSN: [id number]     Arrival date & time 03/20/15  2314 History   First MD Initiated Contact with Patient 03/20/15 2321     Chief Complaint  Patient presents with  . Abdominal Pain    Abd pain x 2 days, N/V.    HPI   49 YO female patient with a history of irritable bowel, complex regional pain syndrome presents today with abdominal pain. Patient reports for the past 2 days she's had right lower quadrant and suprapubic abdominal pain. She notes it's been intermittent, was improved briefly with IBS medications. Patient reports that she called 911 for ambulance transport, reports they gave her fentanyl in the ambulance but did not relieve the pain. Patient reports this is similar to IBS previously, but more severe. Patient denies fever, chills, chest pain, upper abdominal pain, changes in her urine color clarity or characteristics. No lower extremity swelling or edema. No trauma to the abdomen. No exposure to abnormal food or drink, no one else in the household experiencing similar symptoms.   Past Medical History  Diagnosis Date  . Asthma   . Hypertension   . Anxiety   . Anemia   . Hematuria 09/07/2013  . Complex regional pain syndrome   . Migraine   . IBS (irritable bowel syndrome)   . GERD (gastroesophageal reflux disease)   . Chronic back pain   . Chronic neck pain   . Paresthesia of left arm    Past Surgical History  Procedure Laterality Date  . Tubal ligation    . Hernia repair      ventral hernia  . Ulnar nerve repair Left     [facility] 2013  . Dilitation & currettage/hystroscopy with thermachoice ablation N/A 08/17/2013    Procedure: DILATATION & CURETTAGE/HYSTEROSCOPY WITH THERMACHOICE ABLATION;  Surgeon: Jonnie Kind, MD;  Location: [facility];  Service: Gynecology;  Laterality: N/A;  18 ml D5W in & out; total therapy time= 9 minutes 33 seconds; temp= 87 degrees celcius  . Polypectomy N/A 08/17/2013    Procedure: REMOVAL ENDOMETRIAL POLYP;  Surgeon: Jonnie Kind, MD;  Location:  [facility];  Service: Gynecology;  Laterality: N/A;   Family History  Problem Relation Age of Onset  . Diabetes Sister   . Hypertension Sister   . Heart disease Sister   . Cancer Sister     brain tumor  . Heart disease Brother   . Diabetes Brother   . Cancer Maternal Aunt     uterine  . Cancer Maternal Grandmother     uterine  . Other Mother     car accident   History  Substance Use Topics  . Smoking status: Current Every Day Smoker -- 1.00 packs/day for 26 years    Types: Cigarettes  . Smokeless tobacco: Never Used  . Alcohol Use: No   OB History    Gravida Para Term Preterm AB TAB SAB Ectopic Multiple Living   4 4 4       4      Review of Systems  All other systems reviewed and are negative.   Allergies  Other  Home Medications   Prior to Admission medications   Medication Sig Start Date End Date Taking? Authorizing Provider  albuterol (PROVENTIL HFA;VENTOLIN HFA) 108 (90 BASE) MCG/ACT inhaler Inhale 2 puffs into the lungs every 6 (six) hours as needed for wheezing or shortness of breath.    Yes Historical Provider, MD  ALPRAZolam Duanne Moron) 1 MG tablet Take 1 mg by mouth 3 (  three) times daily.   Yes Historical Provider, MD  DULoxetine (CYMBALTA) 60 MG capsule Take 1 capsule (60 mg total) by mouth daily. 07/29/14  Yes Charlett Blake, MD  gabapentin (NEURONTIN) 600 MG tablet Take 600 mg by mouth 3 (three) times daily as needed (pain).    Yes Historical Provider, MD  lidocaine (LIDODERM) 5 % Place 1 patch onto the skin daily. 10/04/14  Yes Historical Provider, MD  LINZESS 145 MCG CAPS capsule Take 1 capsule by mouth daily. 05/10/14  Yes Historical Provider, MD  lisinopril-hydrochlorothiazide (PRINZIDE,ZESTORETIC) 20-25 MG per tablet Take 1 tablet by mouth daily. 05/10/14  Yes Historical Provider, MD  nortriptyline (PAMELOR) 10 MG capsule Take 20 mg by mouth daily. 10/11/14  Yes Historical Provider, MD  oxyCODONE-acetaminophen (PERCOCET) 10-325 MG per tablet Take 2 tablets by  mouth 3 (three) times daily. 10/11/14  Yes Historical Provider, MD  promethazine (PHENERGAN) 25 MG tablet Take 25 mg by mouth every 6 (six) hours as needed for nausea or vomiting.   Yes Historical Provider, MD  topiramate (TOPAMAX) 100 MG tablet Take 1 tablet by mouth 2 (two) times daily. 06/06/14  Yes Historical Provider, MD  zolpidem (AMBIEN) 10 MG tablet Take 10 mg by mouth at bedtime as needed for sleep.    Yes Historical Provider, MD  carisoprodol (SOMA) 350 MG tablet Take 350 mg by mouth 3 (three) times daily.  10/03/14   Historical Provider, MD  ketorolac (TORADOL) 10 MG tablet Take 1 tablet (10 mg total) by mouth every 6 (six) hours as needed. Patient not taking: Reported on 03/20/2015 08/17/13   Jonnie Kind, MD  LISINOPRIL PO Take 1 tablet by mouth daily.    Historical Provider, MD  nystatin (MYCOSTATIN) 100000 UNIT/ML suspension Take 5 mLs (500,000 Units total) by mouth 4 (four) times daily. Patient not taking: Reported on 10/28/2014 09/13/13   Estill Dooms, NP  nystatin-triamcinolone ointment [facility]) Apply 1 application topically 2 (two) times daily. To affected area. Patient not taking: Reported on 03/20/2015 09/17/13   Jonnie Kind, MD  oxyCODONE (ROXICODONE) 15 MG immediate release tablet Take 1 tablet (15 mg total) by mouth every 8 (eight) hours as needed for pain. Patient not taking: Reported on 10/28/2014 08/22/14   Charlett Blake, MD   BP 125/84 mmHg  Pulse 72  Temp(Src) 98.1 F (36.7 C) (Oral)  Resp 20  SpO2 99%   Physical Exam  Constitutional: She is oriented to person, place, and time. She appears well-developed and well-nourished.  HENT:  Head: Normocephalic and atraumatic.  Eyes: Conjunctivae are normal. Pupils are equal, round, and reactive to light. Right eye exhibits no discharge. Left eye exhibits no discharge. No scleral icterus.  Neck: Normal range of motion. No JVD present. No tracheal deviation present.  Pulmonary/Chest: Effort normal. No stridor.   Abdominal: Soft. Bowel sounds are normal. She exhibits no distension and no mass. There is tenderness. There is guarding. There is no rebound.  Tender to palpation of the right lower quadrant, periumbilical tenderness. No signs of bruising, trauma, abdominal distention, no masses felt. No rebound, mild guarding.  Neurological: She is alert and oriented to person, place, and time. Coordination normal.  Skin: Skin is warm and dry.  Psychiatric: She has a normal mood and affect. Her behavior is normal. Judgment and thought content normal.  Nursing note and vitals reviewed.   ED Course  Procedures (including critical care time) Labs Review Labs Reviewed  COMPREHENSIVE METABOLIC PANEL - Abnormal; Notable for the  following:    Potassium 2.9 (*)    Glucose, Bld 102 (*)    Calcium 8.7 (*)    All other components within normal limits  LIPASE, BLOOD - Abnormal; Notable for the following:    Lipase 10 (*)    All other components within normal limits  CBC WITH DIFFERENTIAL/PLATELET  URINALYSIS, ROUTINE W REFLEX MICROSCOPIC (NOT AT [facility])  POC URINE PREG, ED    Imaging Review No results found.   EKG Interpretation None      MDM   Final diagnoses:  Abdominal pain, acute    Labs: CBC, CMP, lipase, urinalysis, urine pregnant-  Imaging: CT abdomen pelvis with contrast  Consults:  Therapeutics: Dilaudid, zofran, and NS   Assessment:  Plan: Pt presents with N/V/D and acute abdominal pain. She was given dilaudid, Zofran and NS here in the ED. Pt care singed out to [employer] PA-C at the time of shift change pending CT. This likely represents IBS but due to patients reported 10/10 pain unrelieved with Fentanyl further imaging indicated.       Okey Regal, PA-C 03/21/15 [place], DO 03/21/15 [phone]

## 2015-03-21 NOTE — ED Notes (Signed)
When collecting urine cup from patient's room, patient was sitting on chair at bedside eating BBQ potato chips.  Pt was advised that she was to not have any additional PO intake until after testing was complete.  Pt appeared to have no signs of distress or pain.

## 2015-06-30 ENCOUNTER — Emergency Department (HOSPITAL_COMMUNITY)
Admission: EM | Admit: 2015-06-30 | Discharge: 2015-06-30 | Disposition: A | Discharge status: Home / Self Care | Payer: BLUE CROSS/BLUE SHIELD | Attending: Emergency Medicine | Admitting: Emergency Medicine

## 2015-06-30 DIAGNOSIS — M542 Cervicalgia: Secondary | ICD-10-CM | POA: Diagnosis not present

## 2015-06-30 DIAGNOSIS — G5642 Causalgia of left upper limb: Secondary | ICD-10-CM | POA: Diagnosis not present

## 2015-06-30 DIAGNOSIS — I10 Essential (primary) hypertension: Secondary | ICD-10-CM | POA: Diagnosis not present

## 2015-06-30 DIAGNOSIS — M545 Low back pain: Secondary | ICD-10-CM | POA: Insufficient documentation

## 2015-06-30 DIAGNOSIS — G43909 Migraine, unspecified, not intractable, without status migrainosus: Secondary | ICD-10-CM | POA: Insufficient documentation

## 2015-06-30 DIAGNOSIS — F419 Anxiety disorder, unspecified: Secondary | ICD-10-CM | POA: Insufficient documentation

## 2015-06-30 DIAGNOSIS — J45909 Unspecified asthma, uncomplicated: Secondary | ICD-10-CM | POA: Diagnosis not present

## 2015-06-30 DIAGNOSIS — Z8719 Personal history of other diseases of the digestive system: Secondary | ICD-10-CM | POA: Insufficient documentation

## 2015-06-30 DIAGNOSIS — G894 Chronic pain syndrome: Secondary | ICD-10-CM

## 2015-06-30 DIAGNOSIS — R32 Unspecified urinary incontinence: Secondary | ICD-10-CM | POA: Diagnosis not present

## 2015-06-30 DIAGNOSIS — R350 Frequency of micturition: Secondary | ICD-10-CM | POA: Insufficient documentation

## 2015-06-30 DIAGNOSIS — Z862 Personal history of diseases of the blood and blood-forming organs and certain disorders involving the immune mechanism: Secondary | ICD-10-CM | POA: Insufficient documentation

## 2015-06-30 DIAGNOSIS — R52 Pain, unspecified: Secondary | ICD-10-CM | POA: Diagnosis present

## 2015-06-30 DIAGNOSIS — Z720 Tobacco use: Secondary | ICD-10-CM | POA: Diagnosis not present

## 2015-06-30 DIAGNOSIS — Z79899 Other long term (current) drug therapy: Secondary | ICD-10-CM | POA: Insufficient documentation

## 2015-06-30 MED ORDER — GABAPENTIN 800 MG PO TABS: 800.0000 mg | ORAL_TABLET | Freq: Three times a day (TID) | ORAL | Status: DC

## 2015-06-30 MED ORDER — KETOROLAC TROMETHAMINE 60 MG/2ML IM SOLN
60.0000 mg | Freq: Once | INTRAMUSCULAR | Status: AC
  Administered 2015-06-30: 60 mg via INTRAMUSCULAR
  Filled 2015-06-30: qty 2

## 2015-06-30 MED ORDER — OXYCODONE HCL 5 MG PO TABS
15.0000 mg | ORAL_TABLET | Freq: Once | ORAL | Status: AC
  Administered 2015-06-30: 15 mg via ORAL
  Filled 2015-06-30: qty 3

## 2015-06-30 MED ORDER — HYDROMORPHONE HCL 1 MG/ML IJ SOLN
1.0000 mg | Freq: Once | INTRAMUSCULAR | Status: AC
  Administered 2015-06-30: 1 mg via INTRAMUSCULAR
  Filled 2015-06-30: qty 1

## 2015-06-30 MED ORDER — CYCLOBENZAPRINE HCL 10 MG PO TABS
10.0000 mg | ORAL_TABLET | Freq: Once | ORAL | Status: AC
Start: 2015-06-30 — End: 2015-06-30
  Administered 2015-06-30: 10 mg via ORAL
  Filled 2015-06-30: qty 1

## 2015-06-30 MED ORDER — NAPROXEN 500 MG PO TABS: 500.0000 mg | ORAL_TABLET | Freq: Two times a day (BID) | ORAL | Status: DC

## 2015-06-30 NOTE — Discharge Instructions (Signed)
Chronic Pain  Chronic pain can be defined as pain that is off and on and lasts for 3-6 months or longer. Many things cause chronic pain, which can make it difficult to make a diagnosis. There are many treatment options available for chronic pain. However, finding a treatment that works well for you may require trying various approaches until the right one is found. Many people benefit from a combination of two or more types of treatment to control their pain.  SYMPTOMS   Chronic pain can occur anywhere in the body and can range from mild to very severe. Some types of chronic pain include:  · Headache.  · Low back pain.  · Cancer pain.  · Arthritis pain.  · Neurogenic pain. This is pain resulting from damage to nerves.   People with chronic pain may also have other symptoms such as:  · Depression.  · Anger.  · Insomnia.  · Anxiety.  DIAGNOSIS   Your health care provider will help diagnose your condition over time. In many cases, the initial focus will be on excluding possible conditions that could be causing the pain. Depending on your symptoms, your health care provider may order tests to diagnose your condition. Some of these tests may include:   · Blood tests.    · CT scan.    · MRI.    · X-rays.    · Ultrasounds.    · Nerve conduction studies.    You may need to see a specialist.   TREATMENT   Finding treatment that works well may take time. You may be referred to a pain specialist. He or she may prescribe medicine or therapies, such as:   · Mindful meditation or yoga.  · Shots (injections) of numbing or pain-relieving medicines into the spine or area of pain.  · Local electrical stimulation.  · Acupuncture.    · Massage therapy.    · Aroma, color, light, or sound therapy.    · Biofeedback.    · Working with a physical therapist to keep from getting stiff.    · Regular, gentle exercise.    · Cognitive or behavioral therapy.    · Group support.    Sometimes, surgery may be recommended.   HOME CARE INSTRUCTIONS    · Take all medicines as directed by your health care provider.    · Lessen stress in your life by relaxing and doing things such as listening to calming music.    · Exercise or be active as directed by your health care provider.    · Eat a healthy diet and include things such as vegetables, fruits, fish, and lean meats in your diet.    · Keep all follow-up appointments with your health care provider.    · Attend a support group with others suffering from chronic pain.  SEEK MEDICAL CARE IF:   · Your pain gets worse.    · You develop a new pain that was not there before.    · You cannot tolerate medicines given to you by your health care provider.    · You have new symptoms since your last visit with your health care provider.    SEEK IMMEDIATE MEDICAL CARE IF:   · You feel weak.    · You have decreased sensation or numbness.    · You lose control of bowel or bladder function.    · Your pain suddenly gets much worse.    · You develop shaking.  · You develop chills.  · You develop confusion.  · You develop chest pain.  · You develop shortness of breath.    MAKE SURE YOU:  ·   Document Revised: 05/05/2013 Document Reviewed: 02/26/2013 Elsevier Interactive Patient Education 2016 [employer]  Complex Regional Pain Syndrome Complex regional pain syndrome (CRPS) is a nerve disorder that causes long-lasting (chronic) pain, usually in a hand, arm, leg, or foot. CRPS usually follows an injury or trauma, such as a fracture or sprain. There are two types of CRPS:  Type 1. This type occurs after an injury or trauma with no known damage to a nerve.  Type 2. This type occurs after injury  or trauma damages a nerve. There are three stages of the condition:  Stage 1. This stage, called the acute stage, may last for three months.  Stage 2. This stage, called the dystrophic stage, may last for three to 12 months.  Stage 3. This stage, called the atrophic stage, may start after one year. CRPS ranges from mild to severe. For most people CRPS is mild and recovery happens over time. For others, CRPS lasts a very long time and is debilitating. CAUSES The exact cause of CRPS is not known. RISK FACTORS You may be at increased risk if:  You are a woman.  You are approximately 49 years of age.  You have any of the following:  A family history of CRPS.  An injury or surgery.  An infection.  Cancer.  Neck problems.  A stroke.  A heart attack.  Asthma. SIGNS AND SYMPTOMS Signs and symptoms in the affected limb are different for each stage. Signs and symptoms of stage 1 include:  Burning pain.  A pins and needles sensation.  Extremely sensitive skin.  Swelling.  Joint stiffness.  Warmth and redness.  Excessive sweating.  Hair and nail growth that is faster than normal. Signs and symptoms of stage 2 include:  Spreading of pain to the whole limb.  Increased skin sensitivity.  Increased swelling and stiffness.  Coolness of the skin.  Blue discoloration of skin.  Loss of skin wrinkles.  Brittle fingernails. Signs and symptoms of stage 3 include:   Pain that spreads to other areas of the body but becomes less severe.  More stiffness, leading to loss of motion.  Skin that is pale, dry, shiny, and tightly stretched. DIAGNOSIS There is no test to diagnose CRPS. Your health care provider will make a diagnosis based on your signs and symptoms and a physical exam. The exam may include tests to rule out other possible causes of your symptoms. Sometimes imaging tests are done, such as an MRI or bone scan. These tests check for bone changes that might  indicate CRPS.  TREATMENT Early treatment may prevent CRPS from advancing past stage 1. There is no one treatment that works for everyone. Treatment options may include:  Medicines, such as:  Nonsteroidal-anti-inflammatory drugs (NSAIDS).  Steroids.  Blood pressure drugs.  Antidepressants.  Anti-seizure drugs.  Pain relievers.  Exercise.  Occupational and physical therapy.  Biofeedback.  Mental health counseling.  Numbing injections.  Spinal surgery to implant a spinal cord stimulator or a pain pump. HOME CARE INSTRUCTIONS  Take medicines only as directed by your health care provider.  Follow an exercise program as directed by your health care provider.  Maintain a healthy weight.  Keep all follow-up visits as directed by your health care provider. This is important. SEEK MEDICAL CARE IF:  Your symptoms change.  Your symptoms get worse.  You develop anxiety or depression.   This information is not intended to replace advice given to you by your health care provider. Make sure you  discuss any questions you have with your health care provider.   Document Released: 08/23/2002 Document Revised: 09/23/2014 Document Reviewed: 05/30/2014 Elsevier Interactive Patient Education [employer].

## 2015-06-30 NOTE — ED Notes (Signed)
Patient states that she has "pinched nerves" which cause her significant pain,.  Pain is mostly located radiating down her left arm and leg.  Patient states that pain causes her to have seizures. Pain increases with movement and touch.

## 2015-06-30 NOTE — ED Provider Notes (Addendum)
CSN: [id number]     Arrival date & time 06/30/15  6967 History   First MD Initiated Contact with Patient 06/30/15 [phone]     Chief Complaint  Patient presents with  . Generalized Body Aches     (Consider location/radiation/quality/duration/timing/severity/associated sxs/prior Treatment) Patient is a 49 y.o. female presenting with extremity pain.  Extremity Pain This is a new problem. Episode onset: "years" The problem occurs constantly. The problem has been gradually worsening. Pertinent negatives include no chest pain, no abdominal pain, no headaches and no shortness of breath. Nothing aggravates the symptoms. Nothing relieves the symptoms. She has tried nothing for the symptoms. The treatment provided no relief.    Past Medical History  Diagnosis Date  . Asthma   . Hypertension   . Anxiety   . Anemia   . Hematuria 09/07/2013  . Complex regional pain syndrome   . Migraine   . IBS (irritable bowel syndrome)   . GERD (gastroesophageal reflux disease)   . Chronic back pain   . Chronic neck pain   . Paresthesia of left arm    Past Surgical History  Procedure Laterality Date  . Tubal ligation    . Hernia repair      ventral hernia  . Ulnar nerve repair Left     [facility] 2013  . Dilitation & currettage/hystroscopy with thermachoice ablation N/A 08/17/2013    Procedure: DILATATION & CURETTAGE/HYSTEROSCOPY WITH THERMACHOICE ABLATION;  Surgeon: Jonnie Kind, MD;  Location: [facility];  Service: Gynecology;  Laterality: N/A;  18 ml D5W in & out; total therapy time= 9 minutes 33 seconds; temp= 87 degrees celcius  . Polypectomy N/A 08/17/2013    Procedure: REMOVAL ENDOMETRIAL POLYP;  Surgeon: Jonnie Kind, MD;  Location: [facility];  Service: Gynecology;  Laterality: N/A;   Family History  Problem Relation Age of Onset  . Diabetes Sister   . Hypertension Sister   . Heart disease Sister   . Cancer Sister     brain tumor  . Heart disease Brother   . Diabetes Brother   . Cancer Maternal  Aunt     uterine  . Cancer Maternal Grandmother     uterine  . Other Mother     car accident   Social History  Substance Use Topics  . Smoking status: Current Every Day Smoker -- 1.00 packs/day for 26 years    Types: Cigarettes  . Smokeless tobacco: Never Used  . Alcohol Use: No   OB History    Gravida Para Term Preterm AB TAB SAB Ectopic Multiple Living   4 4 4       4      Review of Systems  Unable to perform ROS: Acuity of condition  Constitutional: Negative for fever.  HENT: Negative for sore throat.   Eyes: Negative for visual disturbance.  Respiratory: Negative for cough and shortness of breath.   Cardiovascular: Negative for chest pain.  Gastrointestinal: Negative for nausea, vomiting, abdominal pain and diarrhea.  Genitourinary: Positive for frequency and enuresis (with coughing and laughing). Negative for difficulty urinating.  Musculoskeletal: Positive for myalgias, back pain, arthralgias and neck pain.  Skin: Negative for rash.  Neurological: Negative for syncope and headaches.      Allergies  Other  Home Medications   Prior to Admission medications   Medication Sig Start Date End Date Taking? Authorizing Provider  albuterol (PROVENTIL HFA;VENTOLIN HFA) 108 (90 BASE) MCG/ACT inhaler Inhale 2 puffs into the lungs every 6 (six) hours as needed for  wheezing or shortness of breath.    Yes Historical Provider, MD  ALPRAZolam Duanne Moron) 1 MG tablet Take 1 mg by mouth 3 (three) times daily.   Yes Historical Provider, MD  DULoxetine (CYMBALTA) 60 MG capsule Take 1 capsule (60 mg total) by mouth daily. 07/29/14  Yes Charlett Blake, MD  LINZESS 145 MCG CAPS capsule Take 145 mcg by mouth daily.  05/10/14  Yes Historical Provider, MD  lisinopril-hydrochlorothiazide (PRINZIDE,ZESTORETIC) 20-25 MG per tablet Take 1 tablet by mouth daily. 05/10/14  Yes Historical Provider, MD  nortriptyline (PAMELOR) 10 MG capsule Take 20 mg by mouth daily. 10/11/14  Yes Historical Provider,  MD  oxyCODONE-acetaminophen (PERCOCET/ROXICET) 5-325 MG per tablet Take 1-2 tablets by mouth every 4 (four) hours as needed for severe pain. 03/21/15  Yes Charlann Lange, PA-C  promethazine (PHENERGAN) 25 MG tablet Take 25 mg by mouth every 6 (six) hours as needed for nausea or vomiting.   Yes Historical Provider, MD  tiZANidine (ZANAFLEX) 4 MG tablet Take 1 tablet by mouth 3 (three) times daily. 06/03/15  Yes Historical Provider, MD  topiramate (TOPAMAX) 100 MG tablet Take 100 mg by mouth 2 (two) times daily.  06/06/14  Yes Historical Provider, MD  zolpidem (AMBIEN) 10 MG tablet Take 10 mg by mouth at bedtime as needed for sleep.    Yes Historical Provider, MD  gabapentin (NEURONTIN) 800 MG tablet Take 1 tablet (800 mg total) by mouth 3 (three) times daily. 06/30/15   Gareth Morgan, MD  naproxen (NAPROSYN) 500 MG tablet Take 1 tablet (500 mg total) by mouth 2 (two) times daily. 06/30/15   Gareth Morgan, MD  nystatin (MYCOSTATIN) 100000 UNIT/ML suspension Take 5 mLs (500,000 Units total) by mouth 4 (four) times daily. Patient not taking: Reported on 10/28/2014 09/13/13   Estill Dooms, NP  oxyCODONE (ROXICODONE) 15 MG immediate release tablet Take 1 tablet (15 mg total) by mouth every 8 (eight) hours as needed for pain. Patient not taking: Reported on 10/28/2014 08/22/14   Charlett Blake, MD   BP 126/74 mmHg  Pulse 59  Temp(Src) 98.4 F (36.9 C) (Oral)  Resp 19  SpO2 100% Physical Exam  Constitutional: She is oriented to person, place, and time. She appears well-developed and well-nourished. She appears distressed (tearful, shaking reporting severe nerve pain).  HENT:  Head: Normocephalic and atraumatic.  Eyes: Conjunctivae and EOM are normal.  Neck: Normal range of motion.  Cardiovascular: Normal rate, regular rhythm, normal heart sounds and intact distal pulses.  Exam reveals no gallop and no friction rub.   No murmur heard. Pulmonary/Chest: Effort normal and breath sounds normal.  No respiratory distress. She has no wheezes. She has no rales.  Abdominal: Soft. She exhibits no distension. There is no tenderness. There is no guarding.  Musculoskeletal: She exhibits no edema.       Left shoulder: She exhibits tenderness.       Left elbow: Tenderness found.       Left wrist: She exhibits tenderness.       Left hip: She exhibits tenderness.       Left knee: Tenderness found.       Left ankle: Tenderness.  Neurological: She is alert and oriented to person, place, and time. She has normal strength.  Initially will not answer questions and will cry and point to areas of pain, later A/O and 5/5 strength  Skin: Skin is warm and dry. No rash noted. She is not diaphoretic. No erythema.  Nursing note  and vitals reviewed.   ED Course  Procedures (including critical care time) Labs Review Labs Reviewed - No data to display  Imaging Review No results found. I have personally reviewed and evaluated these images and lab results as part of my medical decision-making.   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  Patient has not had any recent trauma and doubt fracture.  On arrival to the emergency department patient is in distress from pain, crying, reporting verbally that she's having a seizure secondary to pain, and refuses to participate in parts of the exam secondary to level of pain. Pt tremulous activity clinically represents pseudoseizures.  After administering pain medications, pt noted to have 5/5 bilateral lower extremity strength. She has normal rectal tone, and while she describes urge incontinence, does not describe symptoms of overflow incontinence and have low suspicion for cauda equina or acute spinal  compression. Her symptoms are continuous and the same as previous symptoms for which she sees a neurosurgeon, neurologist, and chronic pain specialist.  Discussed need for continued close outpatient follow up.  Administered home oxycodone, 1mg  dilaudid, flexeril and toradol for pain. Discharged with rx for increased gabapentin 800mg  TID and naproxen 500mg . Patient ambulatory and discharged in stable condition with understanding of reasons to return.   Gareth Morgan, MD 07/01/15 [id number]  Gareth Morgan, MD 07/01/15 [id number]

## 2015-07-04 ENCOUNTER — Encounter (HOSPITAL_COMMUNITY): Payer: Self-pay | Admitting: Emergency Medicine

## 2015-07-04 ENCOUNTER — Emergency Department (HOSPITAL_COMMUNITY)
Admission: EM | Admit: 2015-07-04 | Discharge: 2015-07-04 | Disposition: A | Discharge status: Home / Self Care | Payer: BLUE CROSS/BLUE SHIELD | Attending: Emergency Medicine | Admitting: Emergency Medicine

## 2015-07-04 DIAGNOSIS — Z8719 Personal history of other diseases of the digestive system: Secondary | ICD-10-CM | POA: Diagnosis not present

## 2015-07-04 DIAGNOSIS — Z79899 Other long term (current) drug therapy: Secondary | ICD-10-CM | POA: Diagnosis not present

## 2015-07-04 DIAGNOSIS — Z791 Long term (current) use of non-steroidal anti-inflammatories (NSAID): Secondary | ICD-10-CM | POA: Diagnosis not present

## 2015-07-04 DIAGNOSIS — Z862 Personal history of diseases of the blood and blood-forming organs and certain disorders involving the immune mechanism: Secondary | ICD-10-CM | POA: Insufficient documentation

## 2015-07-04 DIAGNOSIS — I10 Essential (primary) hypertension: Secondary | ICD-10-CM | POA: Insufficient documentation

## 2015-07-04 DIAGNOSIS — F419 Anxiety disorder, unspecified: Secondary | ICD-10-CM | POA: Diagnosis not present

## 2015-07-04 DIAGNOSIS — J45909 Unspecified asthma, uncomplicated: Secondary | ICD-10-CM | POA: Insufficient documentation

## 2015-07-04 DIAGNOSIS — G8929 Other chronic pain: Secondary | ICD-10-CM | POA: Diagnosis present

## 2015-07-04 DIAGNOSIS — Z720 Tobacco use: Secondary | ICD-10-CM | POA: Insufficient documentation

## 2015-07-04 DIAGNOSIS — G5770 Causalgia of unspecified lower limb: Secondary | ICD-10-CM | POA: Diagnosis not present

## 2015-07-04 DIAGNOSIS — G43909 Migraine, unspecified, not intractable, without status migrainosus: Secondary | ICD-10-CM | POA: Diagnosis not present

## 2015-07-04 DIAGNOSIS — G5642 Causalgia of left upper limb: Secondary | ICD-10-CM

## 2015-07-04 NOTE — Discharge Instructions (Signed)
Chronic Pain  Chronic pain can be defined as pain that is off and on and lasts for 3-6 months or longer. Many things cause chronic pain, which can make it difficult to make a diagnosis. There are many treatment options available for chronic pain. However, finding a treatment that works well for you may require trying various approaches until the right one is found. Many people benefit from a combination of two or more types of treatment to control their pain.  SYMPTOMS   Chronic pain can occur anywhere in the body and can range from mild to very severe. Some types of chronic pain include:  · Headache.  · Low back pain.  · Cancer pain.  · Arthritis pain.  · Neurogenic pain. This is pain resulting from damage to nerves.   People with chronic pain may also have other symptoms such as:  · Depression.  · Anger.  · Insomnia.  · Anxiety.  DIAGNOSIS   Your health care provider will help diagnose your condition over time. In many cases, the initial focus will be on excluding possible conditions that could be causing the pain. Depending on your symptoms, your health care provider may order tests to diagnose your condition. Some of these tests may include:   · Blood tests.    · CT scan.    · MRI.    · X-rays.    · Ultrasounds.    · Nerve conduction studies.    You may need to see a specialist.   TREATMENT   Finding treatment that works well may take time. You may be referred to a pain specialist. He or she may prescribe medicine or therapies, such as:   · Mindful meditation or yoga.  · Shots (injections) of numbing or pain-relieving medicines into the spine or area of pain.  · Local electrical stimulation.  · Acupuncture.    · Massage therapy.    · Aroma, color, light, or sound therapy.    · Biofeedback.    · Working with a physical therapist to keep from getting stiff.    · Regular, gentle exercise.    · Cognitive or behavioral therapy.    · Group support.    Sometimes, surgery may be recommended.   HOME CARE INSTRUCTIONS    · Take all medicines as directed by your health care provider.    · Lessen stress in your life by relaxing and doing things such as listening to calming music.    · Exercise or be active as directed by your health care provider.    · Eat a healthy diet and include things such as vegetables, fruits, fish, and lean meats in your diet.    · Keep all follow-up appointments with your health care provider.    · Attend a support group with others suffering from chronic pain.  SEEK MEDICAL CARE IF:   · Your pain gets worse.    · You develop a new pain that was not there before.    · You cannot tolerate medicines given to you by your health care provider.    · You have new symptoms since your last visit with your health care provider.    SEEK IMMEDIATE MEDICAL CARE IF:   · You feel weak.    · You have decreased sensation or numbness.    · You lose control of bowel or bladder function.    · Your pain suddenly gets much worse.    · You develop shaking.  · You develop chills.  · You develop confusion.  · You develop chest pain.  · You develop shortness of breath.    MAKE SURE YOU:  ·   Document Revised: 05/05/2013 Document Reviewed: 02/26/2013 Elsevier Interactive Patient Education 2016 [place]. Chronic Pain Discharge Instructions  Emergency care providers appreciate that many patients coming to [place] are in severe pain and we wish to address their pain in the safest, most responsible manner.  It is important to recognize however, that the proper treatment of chronic pain differs from that of the pain of injuries and acute illnesses.  Our goal is to provide quality, safe, personalized care  and we thank you for giving [place] the opportunity to serve you. The use of narcotics and related agents for chronic pain syndromes may lead to additional physical and psychological problems.  Nearly as many people die from prescription narcotics each year as die from car crashes.  Additionally, this risk is increased if such prescriptions are obtained from a variety of sources.  Therefore, only your primary care physician or a pain management specialist is able to safely treat such syndromes with narcotic medications long-term.    Documentation revealing such prescriptions have been sought from multiple sources may prohibit [place] from providing a refill or different narcotic medication.  Your name may be checked first through the [place].  This database is a record of controlled substance medication prescriptions that the patient has received.  This has been established by [facility] in an effort to eliminate the dangerous, and often life threatening, practice of obtaining multiple prescriptions from different medical providers.   If you have a chronic pain syndrome (i.e. chronic headaches, recurrent back or neck pain, dental pain, abdominal or pelvis pain without a specific diagnosis, or neuropathic pain such as fibromyalgia) or recurrent visits for the same condition without an acute diagnosis, you may be treated with non-narcotics and other non-addictive medicines.  Allergic reactions or negative side effects that may be reported by a patient to such medications will not typically lead to the use of a narcotic analgesic or other controlled substance as an alternative.   Patients managing chronic pain with a personal physician should have provisions in place for breakthrough pain.  If you are in crisis, you should call your physician.  If your physician directs you to the emergency department, please have the doctor call and speak to our attending physician  concerning your care.   When patients come to the Emergency Department (ED) with acute medical conditions in which the Emergency Department physician feels appropriate to prescribe narcotic or sedating pain medication, the physician will prescribe these in very limited quantities.  The amount of these medications will last only until you can see your primary care physician in his/her office.  Any patient who returns to the ED seeking refills should expect only non-narcotic pain medications.   In the event of an acute medical condition exists and the emergency physician feels it is necessary that the patient be given a narcotic or sedating medication -  a responsible adult driver should be present in the room prior to the medication being given by the nurse.   Prescriptions for narcotic or sedating medications that have been lost, stolen or expired will not be refilled in the Emergency Department.    Patients who have chronic pain may receive non-narcotic prescriptions until seen by their primary care physician.  It is every patients personal responsibility to maintain active prescriptions with his or her primary care physician or specialist.

## 2015-07-04 NOTE — ED Notes (Signed)
Per EMS-states anxiety r/t chronic pain issues-states increased anxiety due to being related to [facility] diverting EMS call-NS given in route

## 2015-07-04 NOTE — ED Notes (Signed)
Bed: XY81 Expected date:  Expected time:  Means of arrival:  Comments: Hold for triage 8-sorry

## 2015-07-04 NOTE — ED Notes (Signed)
Pt is very angry that she can not be treated for chronic pain. Pt referred to her PCP. IV discontinued from r/anticubital

## 2015-07-04 NOTE — ED Provider Notes (Signed)
CSN: [id number]     Arrival date & time 07/04/15  1035 History   First MD Initiated Contact with Patient 07/04/15 1123     Chief Complaint  Patient presents with  . Anxiety  . Pain     (Consider location/radiation/quality/duration/timing/severity/associated sxs/prior Treatment) HPI Comments: Patient presents to the emergency department with chief complaint of chronic pain. She states that she has complex regional pain syndrome. She states that she has chronic leg pain. She also states that her anxiety is acting up. She has tried taking gabapentin, oxycodone, and muscle relaxers with no relief. There are no aggravating or alleviating factors.  The history is provided by the patient. No language interpreter was used.    Past Medical History  Diagnosis Date  . Asthma   . Hypertension   . Anxiety   . Anemia   . Hematuria 09/07/2013  . Complex regional pain syndrome   . Migraine   . IBS (irritable bowel syndrome)   . GERD (gastroesophageal reflux disease)   . Chronic back pain   . Chronic neck pain   . Paresthesia of left arm    Past Surgical History  Procedure Laterality Date  . Tubal ligation    . Hernia repair      ventral hernia  . Ulnar nerve repair Left     [facility] 2013  . Dilitation & currettage/hystroscopy with thermachoice ablation N/A 08/17/2013    Procedure: DILATATION & CURETTAGE/HYSTEROSCOPY WITH THERMACHOICE ABLATION;  Surgeon: Jonnie Kind, MD;  Location: [facility];  Service: Gynecology;  Laterality: N/A;  18 ml D5W in & out; total therapy time= 9 minutes 33 seconds; temp= 87 degrees celcius  . Polypectomy N/A 08/17/2013    Procedure: REMOVAL ENDOMETRIAL POLYP;  Surgeon: Jonnie Kind, MD;  Location: [facility];  Service: Gynecology;  Laterality: N/A;   Family History  Problem Relation Age of Onset  . Diabetes Sister   . Hypertension Sister   . Heart disease Sister   . Cancer Sister     brain tumor  . Heart disease Brother   . Diabetes Brother   . Cancer  Maternal Aunt     uterine  . Cancer Maternal Grandmother     uterine  . Other Mother     car accident   Social History  Substance Use Topics  . Smoking status: Current Every Day Smoker -- 1.00 packs/day for 26 years    Types: Cigarettes  . Smokeless tobacco: Never Used  . Alcohol Use: No   OB History    Gravida Para Term Preterm AB TAB SAB Ectopic Multiple Living   4 4 4       4      Review of Systems  Constitutional: Negative for fever and chills.  Respiratory: Negative for shortness of breath.   Cardiovascular: Negative for chest pain.  Gastrointestinal: Negative for nausea, vomiting, diarrhea and constipation.  Genitourinary: Negative for dysuria.      Allergies  Other  Home Medications   Prior to Admission medications   Medication Sig Start Date End Date Taking? Authorizing Provider  albuterol (PROVENTIL HFA;VENTOLIN HFA) 108 (90 BASE) MCG/ACT inhaler Inhale 2 puffs into the lungs every 6 (six) hours as needed for wheezing or shortness of breath.     Historical Provider, MD  ALPRAZolam Duanne Moron) 1 MG tablet Take 1 mg by mouth 3 (three) times daily.    Historical Provider, MD  DULoxetine (CYMBALTA) 60 MG capsule Take 1 capsule (60 mg total) by mouth daily. 07/29/14  Charlett Blake, MD  gabapentin (NEURONTIN) 800 MG tablet Take 1 tablet (800 mg total) by mouth 3 (three) times daily. 06/30/15   Gareth Morgan, MD  LINZESS 145 MCG CAPS capsule Take 145 mcg by mouth daily.  05/10/14   Historical Provider, MD  lisinopril-hydrochlorothiazide (PRINZIDE,ZESTORETIC) 20-25 MG per tablet Take 1 tablet by mouth daily. 05/10/14   Historical Provider, MD  naproxen (NAPROSYN) 500 MG tablet Take 1 tablet (500 mg total) by mouth 2 (two) times daily. 06/30/15   Gareth Morgan, MD  nortriptyline (PAMELOR) 10 MG capsule Take 20 mg by mouth daily. 10/11/14   Historical Provider, MD  nystatin (MYCOSTATIN) 100000 UNIT/ML suspension Take 5 mLs (500,000 Units total) by mouth 4 (four) times  daily. Patient not taking: Reported on 10/28/2014 09/13/13   Estill Dooms, NP  oxyCODONE (ROXICODONE) 15 MG immediate release tablet Take 1 tablet (15 mg total) by mouth every 8 (eight) hours as needed for pain. Patient not taking: Reported on 10/28/2014 08/22/14   Charlett Blake, MD  oxyCODONE-acetaminophen (PERCOCET/ROXICET) 5-325 MG per tablet Take 1-2 tablets by mouth every 4 (four) hours as needed for severe pain. 03/21/15   Charlann Lange, PA-C  promethazine (PHENERGAN) 25 MG tablet Take 25 mg by mouth every 6 (six) hours as needed for nausea or vomiting.    Historical Provider, MD  tiZANidine (ZANAFLEX) 4 MG tablet Take 1 tablet by mouth 3 (three) times daily. 06/03/15   Historical Provider, MD  topiramate (TOPAMAX) 100 MG tablet Take 100 mg by mouth 2 (two) times daily.  06/06/14   Historical Provider, MD  zolpidem (AMBIEN) 10 MG tablet Take 10 mg by mouth at bedtime as needed for sleep.     Historical Provider, MD   BP 124/94 mmHg  Pulse 72  Temp(Src) 98.3 F (36.8 C) (Oral)  Resp 18  SpO2 100% Physical Exam  Constitutional: She is oriented to person, place, and time. She appears well-developed and well-nourished.  HENT:  Head: Normocephalic and atraumatic.  Eyes: Conjunctivae and EOM are normal.  Neck: Normal range of motion.  Cardiovascular: Normal rate.   Pulmonary/Chest: Effort normal.  Abdominal: She exhibits no distension.  Musculoskeletal: Normal range of motion.  Neurological: She is alert and oriented to person, place, and time.  Skin: Skin is dry.  Psychiatric: She has a normal mood and affect. Her behavior is normal. Judgment and thought content normal.  Nursing note and vitals reviewed.   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  When patient was told that she  would need to undress and get into a stretcher so she could be examined she quickly sits up right and starts yelling how she has been "sitting in this wheel chair for 2 hours and nobody has treated my pain!!!"  I informed the patient that I need to examine her and also informed her that we have a chronic pain policy.  She then continued to say "you don't want to treat me because I'm black!!!"  I informed her that I do not treat any chronic pain.  Patient states that "you can't discharge me like this."  I then told her that I would need to examine her, but that we would not be able to treat her chronic pain.  She states well then "that's  fine, I'll just go."  She then began to threaten me stating that she "will complain to the board."  I apologized that we can't treat her for her chronic problems.  She became progressively more verbally abusive to me and nursing staff.  Patient escorted out by security.  Medical screening complete, no emergent process exists at this time.  VSS.  Patient will need to follow-up with pain management or PCP.    [name], PA-C 07/04/15 [place], MD 07/05/15 [phone]

## 2015-07-04 NOTE — ED Notes (Signed)
Bed: WTR8 Expected date:  Expected time:  Means of arrival:  Comments: bariatric

## 2015-07-31 ENCOUNTER — Encounter (HOSPITAL_COMMUNITY): Payer: Self-pay | Admitting: *Deleted

## 2015-07-31 ENCOUNTER — Emergency Department (HOSPITAL_COMMUNITY)
Admission: EM | Admit: 2015-07-31 | Discharge: 2015-07-31 | Discharge status: Against Medical Advice | Payer: No Typology Code available for payment source | Attending: Emergency Medicine | Admitting: Emergency Medicine

## 2015-07-31 DIAGNOSIS — I10 Essential (primary) hypertension: Secondary | ICD-10-CM | POA: Diagnosis not present

## 2015-07-31 DIAGNOSIS — R112 Nausea with vomiting, unspecified: Secondary | ICD-10-CM | POA: Diagnosis not present

## 2015-07-31 DIAGNOSIS — J45909 Unspecified asthma, uncomplicated: Secondary | ICD-10-CM | POA: Diagnosis not present

## 2015-07-31 DIAGNOSIS — R05 Cough: Secondary | ICD-10-CM | POA: Insufficient documentation

## 2015-07-31 DIAGNOSIS — R109 Unspecified abdominal pain: Secondary | ICD-10-CM | POA: Diagnosis present

## 2015-07-31 DIAGNOSIS — F17210 Nicotine dependence, cigarettes, uncomplicated: Secondary | ICD-10-CM | POA: Diagnosis not present

## 2015-07-31 DIAGNOSIS — G8929 Other chronic pain: Secondary | ICD-10-CM | POA: Diagnosis not present

## 2015-07-31 LAB — COMPREHENSIVE METABOLIC PANEL
ALBUMIN: 3.4 g/dL — AB (ref 3.5–5.0)
ALT: 16 U/L (ref 14–54)
ANION GAP: 14 (ref 5–15)
AST: 20 U/L (ref 15–41)
Alkaline Phosphatase: 99 U/L (ref 38–126)
BUN: 12 mg/dL (ref 6–20)
CHLORIDE: 101 mmol/L (ref 101–111)
CO2: 23 mmol/L (ref 22–32)
CREATININE: 0.75 mg/dL (ref 0.44–1.00)
Calcium: 9.3 mg/dL (ref 8.9–10.3)
GFR calc non Af Amer: >60 mL/min (ref >60)
GLUCOSE: 86 mg/dL (ref 65–99)
Potassium: 3.2 mmol/L — ABNORMAL LOW (ref 3.5–5.1)
SODIUM: 138 mmol/L (ref 135–145)
Total Bilirubin: 0.6 mg/dL (ref 0.3–1.2)
Total Protein: 7.4 g/dL (ref 6.5–8.1)

## 2015-07-31 LAB — URINALYSIS, ROUTINE W REFLEX MICROSCOPIC
Bilirubin Urine: NEGATIVE
GLUCOSE, UA: NEGATIVE mg/dL
Hgb urine dipstick: NEGATIVE
Ketones, ur: NEGATIVE mg/dL
LEUKOCYTES UA: NEGATIVE
Nitrite: NEGATIVE
PH: 8 (ref 5.0–8.0)
Protein, ur: NEGATIVE mg/dL
Specific Gravity, Urine: 1.014 (ref 1.005–1.030)
Urobilinogen, UA: 1 mg/dL (ref 0.0–1.0)

## 2015-07-31 LAB — LIPASE, BLOOD: LIPASE: 19 U/L (ref 11–51)

## 2015-07-31 LAB — CBC
HCT: 36.4 % (ref 36.0–46.0)
HEMOGLOBIN: 12 g/dL (ref 12.0–15.0)
MCH: 27.3 pg (ref 26.0–34.0)
MCHC: 33 g/dL (ref 30.0–36.0)
MCV: 82.9 fL (ref 78.0–100.0)
Platelets: 368 10*3/uL (ref 150–400)
RBC: 4.39 MIL/uL (ref 3.87–5.11)
RDW: 13.2 % (ref 11.5–15.5)
WBC: 9.3 10*3/uL (ref 4.0–10.5)

## 2015-07-31 MED ORDER — ONDANSETRON 4 MG PO TBDP
4.0000 mg | ORAL_TABLET | Freq: Once | ORAL | Status: AC | PRN
  Administered 2015-07-31: 4 mg via ORAL

## 2015-07-31 MED ORDER — ONDANSETRON 4 MG PO TBDP
ORAL_TABLET | ORAL | Status: AC
  Filled 2015-07-31: qty 1

## 2015-07-31 NOTE — ED Notes (Signed)
Pt in c/o abd pain, n/v and coughing for the last few days, denies fever, unable to keep anything down, no distress noted

## 2015-08-05 ENCOUNTER — Emergency Department (HOSPITAL_BASED_OUTPATIENT_CLINIC_OR_DEPARTMENT_OTHER): Payer: BLUE CROSS/BLUE SHIELD

## 2015-08-05 ENCOUNTER — Encounter (HOSPITAL_BASED_OUTPATIENT_CLINIC_OR_DEPARTMENT_OTHER): Payer: Self-pay | Admitting: *Deleted

## 2015-08-05 ENCOUNTER — Emergency Department (HOSPITAL_BASED_OUTPATIENT_CLINIC_OR_DEPARTMENT_OTHER)
Admission: EM | Admit: 2015-08-05 | Discharge: 2015-08-05 | Disposition: A | Discharge status: Home / Self Care | Payer: BLUE CROSS/BLUE SHIELD | Attending: Emergency Medicine | Admitting: Emergency Medicine

## 2015-08-05 DIAGNOSIS — J45909 Unspecified asthma, uncomplicated: Secondary | ICD-10-CM | POA: Diagnosis not present

## 2015-08-05 DIAGNOSIS — Y92410 Unspecified street and highway as the place of occurrence of the external cause: Secondary | ICD-10-CM | POA: Diagnosis not present

## 2015-08-05 DIAGNOSIS — S4992XA Unspecified injury of left shoulder and upper arm, initial encounter: Secondary | ICD-10-CM | POA: Insufficient documentation

## 2015-08-05 DIAGNOSIS — G8929 Other chronic pain: Secondary | ICD-10-CM | POA: Insufficient documentation

## 2015-08-05 DIAGNOSIS — Z8719 Personal history of other diseases of the digestive system: Secondary | ICD-10-CM | POA: Diagnosis not present

## 2015-08-05 DIAGNOSIS — F419 Anxiety disorder, unspecified: Secondary | ICD-10-CM | POA: Diagnosis not present

## 2015-08-05 DIAGNOSIS — M79602 Pain in left arm: Secondary | ICD-10-CM

## 2015-08-05 DIAGNOSIS — S59902A Unspecified injury of left elbow, initial encounter: Secondary | ICD-10-CM | POA: Diagnosis not present

## 2015-08-05 DIAGNOSIS — S3992XA Unspecified injury of lower back, initial encounter: Secondary | ICD-10-CM | POA: Insufficient documentation

## 2015-08-05 DIAGNOSIS — F17210 Nicotine dependence, cigarettes, uncomplicated: Secondary | ICD-10-CM | POA: Insufficient documentation

## 2015-08-05 DIAGNOSIS — Z79899 Other long term (current) drug therapy: Secondary | ICD-10-CM | POA: Insufficient documentation

## 2015-08-05 DIAGNOSIS — Z862 Personal history of diseases of the blood and blood-forming organs and certain disorders involving the immune mechanism: Secondary | ICD-10-CM | POA: Insufficient documentation

## 2015-08-05 DIAGNOSIS — I10 Essential (primary) hypertension: Secondary | ICD-10-CM | POA: Diagnosis not present

## 2015-08-05 DIAGNOSIS — S59912A Unspecified injury of left forearm, initial encounter: Secondary | ICD-10-CM | POA: Insufficient documentation

## 2015-08-05 DIAGNOSIS — Y998 Other external cause status: Secondary | ICD-10-CM | POA: Insufficient documentation

## 2015-08-05 DIAGNOSIS — S199XXA Unspecified injury of neck, initial encounter: Secondary | ICD-10-CM | POA: Insufficient documentation

## 2015-08-05 DIAGNOSIS — Y9389 Activity, other specified: Secondary | ICD-10-CM | POA: Diagnosis not present

## 2015-08-05 DIAGNOSIS — G43909 Migraine, unspecified, not intractable, without status migrainosus: Secondary | ICD-10-CM | POA: Diagnosis not present

## 2015-08-05 DIAGNOSIS — M542 Cervicalgia: Secondary | ICD-10-CM

## 2015-08-05 MED ORDER — ONDANSETRON 4 MG PO TBDP
4.0000 mg | ORAL_TABLET | Freq: Once | ORAL | Status: AC
  Administered 2015-08-05: 4 mg via ORAL
  Filled 2015-08-05: qty 1

## 2015-08-05 MED ORDER — OXYCODONE-ACETAMINOPHEN 5-325 MG PO TABS
1.0000 | ORAL_TABLET | Freq: Once | ORAL | Status: AC
  Administered 2015-08-05: 1 via ORAL
  Filled 2015-08-05: qty 1
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
## 2015-08-05 NOTE — ED Notes (Signed)
Patient transported to X-ray 

## 2015-08-05 NOTE — Discharge Instructions (Signed)
Schedule a follow up appointment with your PCP.   Cervical Strain and Sprain With Rehab Cervical strain and sprain are injuries that commonly occur with "whiplash" injuries. Whiplash occurs when the neck is forcefully whipped backward or forward, such as during a motor vehicle accident or during contact sports. The muscles, ligaments, tendons, discs, and nerves of the neck are susceptible to injury when this occurs. RISK FACTORS Risk of having a whiplash injury increases if:  Osteoarthritis of the spine.  Situations that make head or neck accidents or trauma more likely.  High-risk sports (football, rugby, wrestling, hockey, auto racing, gymnastics, diving, contact karate, or boxing).  Poor strength and flexibility of the neck.  Previous neck injury.  Poor tackling technique.  Improperly fitted or padded equipment. SYMPTOMS   Pain or stiffness in the front or back of neck or both.  Symptoms may present immediately or up to 24 hours after injury.  Dizziness, headache, nausea, and vomiting.  Muscle spasm with soreness and stiffness in the neck.  Tenderness and swelling at the injury site. PREVENTION  Learn and use proper technique (avoid tackling with the head, spearing, and head-butting; use proper falling techniques to avoid landing on the head).  Warm up and stretch properly before activity.  Maintain physical fitness:  Strength, flexibility, and endurance.  Cardiovascular fitness.  Wear properly fitted and padded protective equipment, such as padded soft collars, for participation in contact sports. PROGNOSIS  Recovery from cervical strain and sprain injuries is dependent on the extent of the injury. These injuries are usually curable in 1 week to 3 months with appropriate treatment.  RELATED COMPLICATIONS   Temporary numbness and weakness may occur if the nerve roots are damaged, and this may persist until the nerve has completely healed.  Chronic pain due to  frequent recurrence of symptoms.  Prolonged healing, especially if activity is resumed too soon (before complete recovery). TREATMENT  Treatment initially involves the use of ice and medication to help reduce pain and inflammation. It is also important to perform strengthening and stretching exercises and modify activities that worsen symptoms so the injury does not get worse. These exercises may be performed at home or with a therapist. For patients who experience severe symptoms, a soft, padded collar may be recommended to be worn around the neck.  Improving your posture may help reduce symptoms. Posture improvement includes pulling your chin and abdomen in while sitting or standing. If you are sitting, sit in a firm chair with your buttocks against the back of the chair. While sleeping, try replacing your pillow with a small towel rolled to 2 inches in diameter, or use a cervical pillow or soft cervical collar. Poor sleeping positions delay healing.  For patients with nerve root damage, which causes numbness or weakness, the use of a cervical traction apparatus may be recommended. Surgery is rarely necessary for these injuries. However, cervical strain and sprains that are present at birth (congenital) may require surgery. MEDICATION   If pain medication is necessary, nonsteroidal anti-inflammatory medications, such as aspirin and ibuprofen, or other minor pain relievers, such as acetaminophen, are often recommended.  Do not take pain medication for 7 days before surgery.  Prescription pain relievers may be given if deemed necessary by your caregiver. Use only as directed and only as much as you need. HEAT AND COLD:   Cold treatment (icing) relieves pain and reduces inflammation. Cold treatment should be applied for 10 to 15 minutes every 2 to 3 hours for inflammation  and pain and immediately after any activity that aggravates your symptoms. Use ice packs or an ice massage.  Heat treatment may  be used prior to performing the stretching and strengthening activities prescribed by your caregiver, physical therapist, or athletic trainer. Use a heat pack or a warm soak. SEEK MEDICAL CARE IF:   Symptoms get worse or do not improve in 2 weeks despite treatment.  New, unexplained symptoms develop (drugs used in treatment may produce side effects). EXERCISES RANGE OF MOTION (ROM) AND STRETCHING EXERCISES - Cervical Strain and Sprain These exercises may help you when beginning to rehabilitate your injury. In order to successfully resolve your symptoms, you must improve your posture. These exercises are designed to help reduce the forward-head and rounded-shoulder posture which contributes to this condition. Your symptoms may resolve with or without further involvement from your physician, physical therapist or athletic trainer. While completing these exercises, remember:   Restoring tissue flexibility helps normal motion to return to the joints. This allows healthier, less painful movement and activity.  An effective stretch should be held for at least 20 seconds, although you may need to begin with shorter hold times for comfort.  A stretch should never be painful. You should only feel a gentle lengthening or release in the stretched tissue. STRETCH- Axial Extensors  Lie on your back on the floor. You may bend your knees for comfort. Place a rolled-up hand towel or dish towel, about 2 inches in diameter, under the part of your head that makes contact with the floor.  Gently tuck your chin, as if trying to make a "double chin," until you feel a gentle stretch at the base of your head.  Hold __________ seconds. Repeat __________ times. Complete this exercise __________ times per day.  STRETCH - Axial Extension   Stand or sit on a firm surface. Assume a good posture: chest up, shoulders drawn back, abdominal muscles slightly tense, knees unlocked (if standing) and feet hip width  apart.  Slowly retract your chin so your head slides back and your chin slightly lowers. Continue to look straight ahead.  You should feel a gentle stretch in the back of your head. Be certain not to feel an aggressive stretch since this can cause headaches later.  Hold for __________ seconds. Repeat __________ times. Complete this exercise __________ times per day. STRETCH - Cervical Side Bend   Stand or sit on a firm surface. Assume a good posture: chest up, shoulders drawn back, abdominal muscles slightly tense, knees unlocked (if standing) and feet hip width apart.  Without letting your nose or shoulders move, slowly tip your right / left ear to your shoulder until your feel a gentle stretch in the muscles on the opposite side of your neck.  Hold __________ seconds. Repeat __________ times. Complete this exercise __________ times per day. STRETCH - Cervical Rotators   Stand or sit on a firm surface. Assume a good posture: chest up, shoulders drawn back, abdominal muscles slightly tense, knees unlocked (if standing) and feet hip width apart.  Keeping your eyes level with the ground, slowly turn your head until you feel a gentle stretch along the back and opposite side of your neck.  Hold __________ seconds. Repeat __________ times. Complete this exercise __________ times per day. RANGE OF MOTION - Neck Circles   Stand or sit on a firm surface. Assume a good posture: chest up, shoulders drawn back, abdominal muscles slightly tense, knees unlocked (if standing) and feet hip width apart.  Gently roll your head down and around from the back of one shoulder to the back of the other. The motion should never be forced or painful.  Repeat the motion 10-20 times, or until you feel the neck muscles relax and loosen. Repeat __________ times. Complete the exercise __________ times per day. STRENGTHENING EXERCISES - Cervical Strain and Sprain These exercises may help you when beginning to  rehabilitate your injury. They may resolve your symptoms with or without further involvement from your physician, physical therapist, or athletic trainer. While completing these exercises, remember:   Muscles can gain both the endurance and the strength needed for everyday activities through controlled exercises.  Complete these exercises as instructed by your physician, physical therapist, or athletic trainer. Progress the resistance and repetitions only as guided.  You may experience muscle soreness or fatigue, but the pain or discomfort you are trying to eliminate should never worsen during these exercises. If this pain does worsen, stop and make certain you are following the directions exactly. If the pain is still present after adjustments, discontinue the exercise until you can discuss the trouble with your clinician. STRENGTH - Cervical Flexors, Isometric  Face a wall, standing about 6 inches away. Place a small pillow, a ball about 6-8 inches in diameter, or a folded towel between your forehead and the wall.  Slightly tuck your chin and gently push your forehead into the soft object. Push only with mild to moderate intensity, building up tension gradually. Keep your jaw and forehead relaxed.  Hold 10 to 20 seconds. Keep your breathing relaxed.  Release the tension slowly. Relax your neck muscles completely before you start the next repetition. Repeat __________ times. Complete this exercise __________ times per day. STRENGTH- Cervical Lateral Flexors, Isometric   Stand about 6 inches away from a wall. Place a small pillow, a ball about 6-8 inches in diameter, or a folded towel between the side of your head and the wall.  Slightly tuck your chin and gently tilt your head into the soft object. Push only with mild to moderate intensity, building up tension gradually. Keep your jaw and forehead relaxed.  Hold 10 to 20 seconds. Keep your breathing relaxed.  Release the tension slowly.  Relax your neck muscles completely before you start the next repetition. Repeat __________ times. Complete this exercise __________ times per day. STRENGTH - Cervical Extensors, Isometric   Stand about 6 inches away from a wall. Place a small pillow, a ball about 6-8 inches in diameter, or a folded towel between the back of your head and the wall.  Slightly tuck your chin and gently tilt your head back into the soft object. Push only with mild to moderate intensity, building up tension gradually. Keep your jaw and forehead relaxed.  Hold 10 to 20 seconds. Keep your breathing relaxed.  Release the tension slowly. Relax your neck muscles completely before you start the next repetition. Repeat __________ times. Complete this exercise __________ times per day. POSTURE AND BODY MECHANICS CONSIDERATIONS - Cervical Strain and Sprain Keeping correct posture when sitting, standing or completing your activities will reduce the stress put on different body tissues, allowing injured tissues a chance to heal and limiting painful experiences. The following are general guidelines for improved posture. Your physician or physical therapist will provide you with any instructions specific to your needs. While reading these guidelines, remember:  The exercises prescribed by your provider will help you have the flexibility and strength to maintain correct postures.  The correct  posture provides the optimal environment for your joints to work. All of your joints have less wear and tear when properly supported by a spine with good posture. This means you will experience a healthier, less painful body.  Correct posture must be practiced with all of your activities, especially prolonged sitting and standing. Correct posture is as important when doing repetitive low-stress activities (typing) as it is when doing a single heavy-load activity (lifting). PROLONGED STANDING WHILE SLIGHTLY LEANING FORWARD When completing a  task that requires you to lean forward while standing in one place for a long time, place either foot up on a stationary 2- to 4-inch high object to help maintain the best posture. When both feet are on the ground, the low back tends to lose its slight inward curve. If this curve flattens (or becomes too large), then the back and your other joints will experience too much stress, fatigue more quickly, and can cause pain.  RESTING POSITIONS Consider which positions are most painful for you when choosing a resting position. If you have pain with flexion-based activities (sitting, bending, stooping, squatting), choose a position that allows you to rest in a less flexed posture. You would want to avoid curling into a fetal position on your side. If your pain worsens with extension-based activities (prolonged standing, working overhead), avoid resting in an extended position such as sleeping on your stomach. Most people will find more comfort when they rest with their spine in a more neutral position, neither too rounded nor too arched. Lying on a non-sagging bed on your side with a pillow between your knees, or on your back with a pillow under your knees will often provide some relief. Keep in mind, being in any one position for a prolonged period of time, no matter how correct your posture, can still lead to stiffness. WALKING Walk with an upright posture. Your ears, shoulders, and hips should all line up. OFFICE WORK When working at a desk, create an environment that supports good, upright posture. Without extra support, muscles fatigue and lead to excessive strain on joints and other tissues. CHAIR:  A chair should be able to slide under your desk when your back makes contact with the back of the chair. This allows you to work closely.  The chair's height should allow your eyes to be level with the upper part of your monitor and your hands to be slightly lower than your elbows.  Body position:  Your  feet should make contact with the floor. If this is not possible, use a foot rest.  Keep your ears over your shoulders. This will reduce stress on your neck and low back.   This information is not intended to replace advice given to you by your health care provider. Make sure you discuss any questions you have with your health care provider.   Document Released: 09/02/2005 Document Revised: 09/23/2014 Document Reviewed: 12/15/2008 [employer] Interactive Patient Education 2016 [employer]  Musculoskeletal Pain Musculoskeletal pain is muscle and boney aches and pains. These pains can occur in any part of the body. Your caregiver may treat you without knowing the cause of the pain. They may treat you if blood or urine tests, X-rays, and other tests were normal.  CAUSES There is often not a definite cause or reason for these pains. These pains may be caused by a type of germ (virus). The discomfort may also come from overuse. Overuse includes working out too hard when your body is not fit. Boney  aches also come from weather changes. Bone is sensitive to atmospheric pressure changes. HOME CARE INSTRUCTIONS   Ask when your test results will be ready. Make sure you get your test results.  Only take over-the-counter or prescription medicines for pain, discomfort, or fever as directed by your caregiver. If you were given medications for your condition, do not drive, operate machinery or power tools, or sign legal documents for 24 hours. Do not drink alcohol. Do not take sleeping pills or other medications that may interfere with treatment.  Continue all activities unless the activities cause more pain. When the pain lessens, slowly resume normal activities. Gradually increase the intensity and duration of the activities or exercise.  During periods of severe pain, bed rest may be helpful. Lay or sit in any position that is comfortable.  Putting ice on the injured area.  Put ice in a bag.  Place a  towel between your skin and the bag.  Leave the ice on for 15 to 20 minutes, 3 to 4 times a day.  Follow up with your caregiver for continued problems and no reason can be found for the pain. If the pain becomes worse or does not go away, it may be necessary to repeat tests or do additional testing. Your caregiver may need to look further for a possible cause. SEEK IMMEDIATE MEDICAL CARE IF:  You have pain that is getting worse and is not relieved by medications.  You develop chest pain that is associated with shortness or breath, sweating, feeling sick to your stomach (nauseous), or throw up (vomit).  Your pain becomes localized to the abdomen.  You develop any new symptoms that seem different or that concern you. MAKE SURE YOU:   Understand these instructions.  Will watch your condition.  Will get help right away if you are not doing well or get worse.   This information is not intended to replace advice given to you by your health care provider. Make sure you discuss any questions you have with your health care provider.   Document Released: 09/02/2005 Document Revised: 11/25/2011 Document Reviewed: 05/07/2013 [employer] Interactive Patient Education [employer].

## 2015-08-25 ENCOUNTER — Other Ambulatory Visit: Payer: Self-pay | Admitting: Sports Medicine

## 2015-08-25 DIAGNOSIS — M50222 Other cervical disc displacement at C5-C6 level: Secondary | ICD-10-CM

## 2015-09-05 ENCOUNTER — Ambulatory Visit
Admission: RE | Admit: 2015-09-05 | Discharge: 2015-09-05 | Disposition: A | Discharge status: Home / Self Care | Payer: BLUE CROSS/BLUE SHIELD | Source: Ambulatory Visit | Attending: Sports Medicine | Admitting: Sports Medicine

## 2015-09-05 DIAGNOSIS — M50222 Other cervical disc displacement at C5-C6 level: Secondary | ICD-10-CM

## 2015-09-05 MED ORDER — TRIAMCINOLONE ACETONIDE 40 MG/ML IJ SUSP (RADIOLOGY)
60.0000 mg | Freq: Once | INTRAMUSCULAR | Status: AC
  Administered 2015-09-05: 60 mg via EPIDURAL

## 2015-09-05 MED ORDER — IOHEXOL 300 MG/ML  SOLN
1.0000 mL | Freq: Once | INTRAMUSCULAR | Status: AC | PRN
Start: 2015-09-05 — End: 2015-09-05
  Administered 2015-09-05: 1 mL via EPIDURAL

## 2015-09-05 NOTE — Discharge Instructions (Signed)

## 2015-10-06 ENCOUNTER — Other Ambulatory Visit: Payer: Self-pay | Admitting: Sports Medicine

## 2015-10-06 DIAGNOSIS — M50222 Other cervical disc displacement at C5-C6 level: Secondary | ICD-10-CM

## 2015-10-16 ENCOUNTER — Ambulatory Visit
Admission: RE | Admit: 2015-10-16 | Discharge: 2015-10-16 | Disposition: A | Discharge status: Home / Self Care | Payer: BLUE CROSS/BLUE SHIELD | Source: Ambulatory Visit | Attending: Sports Medicine | Admitting: Sports Medicine

## 2015-10-16 DIAGNOSIS — M50222 Other cervical disc displacement at C5-C6 level: Secondary | ICD-10-CM

## 2015-10-16 MED ORDER — IOHEXOL 300 MG/ML  SOLN
1.0000 mL | Freq: Once | INTRAMUSCULAR | Status: AC | PRN
  Administered 2015-10-16: 1 mL via EPIDURAL

## 2015-10-16 MED ORDER — TRIAMCINOLONE ACETONIDE 40 MG/ML IJ SUSP (RADIOLOGY)
60.0000 mg | Freq: Once | INTRAMUSCULAR | Status: AC
  Administered 2015-10-16: 60 mg via EPIDURAL

## 2015-10-16 NOTE — Discharge Instructions (Signed)

## 2016-04-23 ENCOUNTER — Ambulatory Visit (INDEPENDENT_AMBULATORY_CARE_PROVIDER_SITE_OTHER): Payer: Managed Care, Other (non HMO) | Admitting: Diagnostic Neuroimaging

## 2016-04-23 ENCOUNTER — Encounter: Payer: Self-pay | Admitting: Diagnostic Neuroimaging

## 2016-04-23 VITALS — BP 122/84 | HR 102 | Wt 205.0 lb

## 2016-04-23 DIAGNOSIS — F329 Major depressive disorder, single episode, unspecified: Secondary | ICD-10-CM | POA: Diagnosis not present

## 2016-04-23 DIAGNOSIS — M79602 Pain in left arm: Secondary | ICD-10-CM | POA: Diagnosis not present

## 2016-04-23 DIAGNOSIS — F32A Depression, unspecified: Secondary | ICD-10-CM

## 2016-04-23 DIAGNOSIS — G5622 Lesion of ulnar nerve, left upper limb: Secondary | ICD-10-CM

## 2016-04-23 DIAGNOSIS — G5621 Lesion of ulnar nerve, right upper limb: Secondary | ICD-10-CM | POA: Diagnosis not present

## 2016-04-23 DIAGNOSIS — G5642 Causalgia of left upper limb: Secondary | ICD-10-CM | POA: Diagnosis not present

## 2016-04-23 NOTE — Progress Notes (Signed)
[facility]  PATIENT: Joan [name] DOB: 08/31/1966  REFERRING CLINICIAN:  HISTORY FROM: patient  REASON FOR VISIT: follow up   HISTORICAL  CHIEF COMPLAINT:  Chief Complaint  Patient presents with  . Pain    rm 9, husband- Mitzi Hansen, "Right hand will lock up, get shooting pains up my R arm x 2 wks; chronic pain in left hand"    HISTORY OF PRESENT ILLNESS:   UPDATE 04/23/16: Since last visit, patient had been working with Dr .Vira Blanco. Then apparently she discontinued follow up with him (on good terms apparently), and continued pain treatments with Dr. Hassell Done (PCP), then went to Dr. Gwenevere Ghazi (PCP). Now with continued pain. She would like to return to Dr. Vira Blanco. Also with right hand / arm pain x 2-3 weeks.   UPDATE 02/21/14: Since last visit, had second opinion at [facility] (Dr. 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 She had 2 more EMGs since last visit (1 before and 1 after surgical decompression). In Dec 2013 she had left ulnar nerve exploration and decompression, with confirmed ulnar nerve compression at olecranon groove; unfortunately she had no benefit in symptoms following her surgery. Since then she continued to have worsening pain and weakness in the left hand. She tried pain mgmt (Dr. Vira Blanco) with cervical ganglion block (initially great results after 1st injection, but followup 4-5 injections not helpful). Now on pain meds, gabapentin and narcotics. Also with severe depression as a result. Had followup MRI brain and c-spine, which showed C5-6 disc bulging and some new gliosis/cord isgnal abnl at same level. Referred here to see if there are any other neurologic causes of her pain/CRPS.   UPDATE 03/12/12: Since last visit, left hand strength is stable. But since last visit, she now is having int spells with left hand pain, numbness, cold sensation, sometimes assoc with "hives" developing in the LUE and spreading to entire body. This  is followed by whole body "paralysis" and inability to speak. She is awake and can hear. Sometimes she can blink to command and move her fingers. Spells last 15 min up to 2 hours. Now they occur 3-4 times per week.   UPDATE 11/20/11: Doing slightly better. Weakness slightly improving. Sensation normal. Reviewed test results. Patient is back to working out.  PRIOR HPI (07/30/11): 50 year old right-handed female with history of hypertension, here for evaluation of left upper extremity numbness, pain and weakness since July 2012.  Patient reports gradual onset numbness in her left hand fourth and fifth digits, with numbness and pain radiating to her left shoulder. She is also noticed muscle atrophy and weakness.  She has intermittent twitching in her left hand. Denies problems with her right arm or legs. Denies any facial numbness or weakness or vision problems.   REVIEW OF SYSTEMS: Full 14 system review of systems performed and notable only for constipation feels hot / cold insomnia depression numbness and weakness.  ALLERGIES: Allergies  Allergen Reactions  . Gabapentin Other (See Comments)    Elevated BP requiring doubling of current meds.    . Other Other (See Comments)    IV meds for migraine, patient stated it made her feel like something was crawling all over her body.    HOME MEDICATIONS: Outpatient Medications Prior to Visit  Medication Sig Dispense Refill  . albuterol (PROVENTIL HFA;VENTOLIN HFA) 108 (90 BASE) MCG/ACT inhaler Inhale 2 puffs into the lungs every 6 (six) hours as needed for wheezing or shortness of breath.     Marland Kitchen  ALPRAZolam (XANAX) 1 MG tablet Take 1 mg by mouth 3 (three) times daily.    . DULoxetine (CYMBALTA) 60 MG capsule Take 1 capsule (60 mg total) by mouth daily. 30 capsule 5  . gabapentin (NEURONTIN) 800 MG tablet Take 1 tablet (800 mg total) by mouth 3 (three) times daily. 30 tablet 0  . LINZESS 145 MCG CAPS capsule Take 145 mcg by mouth daily.     Marland Kitchen  lisinopril-hydrochlorothiazide (PRINZIDE,ZESTORETIC) 20-25 MG per tablet Take 1 tablet by mouth daily.    . promethazine (PHENERGAN) 25 MG tablet Take 25 mg by mouth every 6 (six) hours as needed for nausea or vomiting.    Marland Kitchen tiZANidine (ZANAFLEX) 4 MG tablet Take 1 tablet by mouth 3 (three) times daily.  4  . topiramate (TOPAMAX) 100 MG tablet Take 200 mg by mouth at bedtime.     Marland Kitchen zolpidem (AMBIEN) 10 MG tablet Take 10 mg by mouth at bedtime as needed for sleep.     Marland Kitchen oxyCODONE-acetaminophen (PERCOCET/ROXICET) 5-325 MG per tablet Take 1-2 tablets by mouth every 4 (four) hours as needed for severe pain. 15 tablet 0   No facility-administered medications prior to visit.     PAST MEDICAL HISTORY: Past Medical History:  Diagnosis Date  . Anemia   . Anxiety   . Asthma   . Chronic back pain   . Chronic neck pain   . Complex regional pain syndrome   . GERD (gastroesophageal reflux disease)   . Hematuria 09/07/2013  . Hypertension   . IBS (irritable bowel syndrome)   . Migraine   . Paresthesia of left arm     PAST SURGICAL HISTORY: Past Surgical History:  Procedure Laterality Date  . DILITATION & CURRETTAGE/HYSTROSCOPY WITH THERMACHOICE ABLATION N/A 08/17/2013   Procedure: DILATATION & CURETTAGE/HYSTEROSCOPY WITH THERMACHOICE ABLATION;  Surgeon: Jonnie Kind, MD;  Location: [facility];  Service: Gynecology;  Laterality: N/A;  18 ml D5W in & out; total therapy time= 9 minutes 33 seconds; temp= 87 degrees celcius  . HERNIA REPAIR     ventral hernia  . POLYPECTOMY N/A 08/17/2013   Procedure: REMOVAL ENDOMETRIAL POLYP;  Surgeon: Jonnie Kind, MD;  Location: [facility];  Service: Gynecology;  Laterality: N/A;  . TUBAL LIGATION    . ULNAR NERVE REPAIR Left    [facility] 2013    FAMILY HISTORY: Family History  Problem Relation Age of Onset  . Diabetes Sister   . Hypertension Sister   . Heart disease Sister   . Cancer Sister     brain tumor  . Heart disease Brother   . Diabetes Brother   .  Cancer Maternal Aunt     uterine  . Cancer Maternal Grandmother     uterine  . Other Mother     car accident    SOCIAL HISTORY:  Social History   Social History  . Marital status: Married    Spouse name: Mitzi Hansen  . Number of children: 4  . Years of education: College   Occupational History  .  [employer]    n/a   Social History Main Topics  . Smoking status: Current Every Day Smoker    Packs/day: 1.00    Years: 26.00    Types: Cigarettes  . Smokeless tobacco: Never Used     Comment: 04/23/16 1/2- 3/4 PPD  . Alcohol use No  . Drug use: No  . Sexual activity: Yes    Birth control/ protection: None, Surgical  Comment: not at this time   Other Topics Concern  . Not on file   Social History Narrative   Patient lives at home with family.   Caffeine Use: 6-7 cups daily     PHYSICAL EXAM  Vitals:   04/23/16 1429  BP: 122/84  Pulse: (!) 102  Weight: 205 lb (93 kg)    Not recorded      Body mass index is 32.11 kg/m.  GENERAL EXAM: Patient is TEARFUL. LEFT ARM PAIN. NECK AND LEFT ARM WITH DECR ROM. GUARDS LEFT ARM AND HAND IN FLEXED POSITION AT HER SIDE.   Mental Status: Awake, alert. Language is fluent and comprehension intact. Cranial Nerves: No evidence of papilledema on funduscopic exam.  Pupils are equal and reactive to light.  Visual fields are full to confrontation.  Conjugate eye movements are full and symmetric.  Facial sensation and strength are symmetric.  Hearing is intact.  Palate elevated symmetrically and uvula is midline.  Shoulder shrug is symmetric.  Tongue is midline. Motor: Normal bulk and tone.  RUE 4/5 (LIMITED BY PAIN). LUE GUARDED. DECR PASSIVE ROM AT SHOULDER, ELBOW, WRIST AND FINGERS. DIGITS 3-5 FLEXED. OVERALL LUE 2-3 PROX AND 1-2 DISTAL. LEFT FDI ATROPHY.  BLE 5/5. Sensory: Intact and symmetric to light touch, pinprick, temperature, vibration; DECR PP IN LEFT FINGERS. Coordination: No ataxia or dysmetria on finger-nose or  rapid alternating movement testing. Gait and Station: Narrow based gait, able to walk on heels and toes.  Tandem gait is stable.  Romberg is negative. Reflexes: RUE AND BLE 1; LUE LIMITED BY PAIN AND POOR RELAXATION    DIAGNOSTIC DATA (LABS, IMAGING, TESTING) - I reviewed patient records, labs, notes, testing and imaging myself where available.  Lab Results  Component Value Date   WBC 9.3 07/31/2015   HGB 12.0 07/31/2015   HCT 36.4 07/31/2015   MCV 82.9 07/31/2015   PLT 368 07/31/2015      Component Value Date/Time   NA 138 07/31/2015 2036   K 3.2 (L) 07/31/2015 2036   CL 101 07/31/2015 2036   CO2 23 07/31/2015 2036   GLUCOSE 86 07/31/2015 2036   BUN 12 07/31/2015 2036   CREATININE 0.75 07/31/2015 2036   CALCIUM 9.3 07/31/2015 2036   PROT 7.4 07/31/2015 2036   ALBUMIN 3.4 (L) 07/31/2015 2036   AST 20 07/31/2015 2036   ALT 16 07/31/2015 2036   ALKPHOS 99 07/31/2015 2036   BILITOT 0.6 07/31/2015 2036   GFRNONAA >60 07/31/2015 2036   GFRAA >60 07/31/2015 2036   No results found for: CHOL No results found for: HGBA1C No results found for: VITAMINB12 No results found for: TSH  08/14/11 MRI CERVICAL - At C5-6 there is mild spinal stenosis and no foraminal stenosis due to disc bulging.  No cord signal changes.    08/14/11 EMG/NCS 1. Severe left ulnar neuropathy 8cm proximal to the ulnar groove at the elbow, with partial conduction block on nerve conduction studies and chronic denervation changes on needle EMG.  This may be due to compression neuropathy related to arcade of struthers or medial intermuscular septum.  Chronic This correlates with the patient's primary symptoms. 2. 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 3. Mild right ulnar sensory motor neuropathy at the wrist and right radial sensory neuropathy are subclinical. 4. No evidence of cervical radiculopathy or brachial plexopathy.  04/01/12 MRI brain - normal   04/01/12 EEG -  normal  Labs (ANCA, SPEP, UPEP, HIV, A1C,  TSH, ACE, ANA, ESR, CRP, B12) all normal.  LP (WBC 1, RBC 1, glucose 61, protein 43, ACE 7 (nl), CMV PCR neg, cytopath --> mixed inflam cells, Lyme ab neg, gram stain/cx, viral cx neg).  12/18/13 MRI cervical spine - Stenosis at C5-6 related to central protrusion with bony overgrowth. Possible left C6 nerve root impingement. Canal stenosis measuring 6-7 mm AP diameter with abnormal cord signal on the right, likely chronic ischemia.   12/19/13 MRI lumbar spine - normal  01/04/14 MRI brain - normal    ASSESSMENT AND PLAN  50 y.o. year old female here with left upper extremity numbness, weakness and pain since July 2012. Confirmed to left ulnar nerve compression at the elbow, s/p left ulnar nerve decompression.   Now with ongoing intractable pain, possibly due to complex regional pain syndrome type 2.   Also with right wrist/hand pain, likely mild right ulnar neuropathy at wrist and arthritis; this was known about since 2012 EMG/NCS.   Also with significant depression/anxiety as a result.   Has seen spine surgeon re: cervical spine stenosis and cord signal changes, but they do not feel surgical decompression would be beneficial. I agree that the cord signal change is likely chronic gliosis or myelomalacia from degenerative/compressive/traumatic etiology, and not likely demyelinating/inflamm.     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 Greater than 50% of time was spent in counseling and coordination of care with patient. In summary we discussed:  - agree with pain management per Dr. Vira Blanco (ask PCP for referral) - consider PT/OT evaluations - consider  psychiatry/psychology eval and mgmt of mood disturbance  Return for return to PCP.    Penni Bombard, MD 04/21/3816, 7:11 PM Certified in Neurology, Neurophysiology and Neuroimaging  [facility] [address], [place], [place] [address] [phone]

## 2016-06-04 ENCOUNTER — Inpatient Hospital Stay (HOSPITAL_COMMUNITY)
Admission: EM | Admit: 2016-06-04 | Discharge: 2016-06-07 | DRG: 193 | Disposition: A | Discharge status: Home / Self Care | Payer: Managed Care, Other (non HMO) | Attending: Internal Medicine | Admitting: Internal Medicine

## 2016-06-04 ENCOUNTER — Emergency Department (HOSPITAL_COMMUNITY): Payer: Managed Care, Other (non HMO)

## 2016-06-04 ENCOUNTER — Encounter (HOSPITAL_COMMUNITY): Payer: Self-pay | Admitting: Emergency Medicine

## 2016-06-04 DIAGNOSIS — J189 Pneumonia, unspecified organism: Secondary | ICD-10-CM | POA: Diagnosis present

## 2016-06-04 DIAGNOSIS — M542 Cervicalgia: Secondary | ICD-10-CM | POA: Diagnosis present

## 2016-06-04 DIAGNOSIS — K219 Gastro-esophageal reflux disease without esophagitis: Secondary | ICD-10-CM | POA: Diagnosis present

## 2016-06-04 DIAGNOSIS — K589 Irritable bowel syndrome without diarrhea: Secondary | ICD-10-CM | POA: Diagnosis present

## 2016-06-04 DIAGNOSIS — R6510 Systemic inflammatory response syndrome (SIRS) of non-infectious origin without acute organ dysfunction: Secondary | ICD-10-CM

## 2016-06-04 DIAGNOSIS — F419 Anxiety disorder, unspecified: Secondary | ICD-10-CM | POA: Diagnosis present

## 2016-06-04 DIAGNOSIS — G894 Chronic pain syndrome: Secondary | ICD-10-CM

## 2016-06-04 DIAGNOSIS — A419 Sepsis, unspecified organism: Secondary | ICD-10-CM

## 2016-06-04 DIAGNOSIS — G43909 Migraine, unspecified, not intractable, without status migrainosus: Secondary | ICD-10-CM | POA: Diagnosis present

## 2016-06-04 DIAGNOSIS — Z79891 Long term (current) use of opiate analgesic: Secondary | ICD-10-CM

## 2016-06-04 DIAGNOSIS — M4802 Spinal stenosis, cervical region: Secondary | ICD-10-CM | POA: Diagnosis present

## 2016-06-04 DIAGNOSIS — Z79899 Other long term (current) drug therapy: Secondary | ICD-10-CM

## 2016-06-04 DIAGNOSIS — J9601 Acute respiratory failure with hypoxia: Secondary | ICD-10-CM | POA: Diagnosis present

## 2016-06-04 DIAGNOSIS — G5642 Causalgia of left upper limb: Secondary | ICD-10-CM | POA: Diagnosis present

## 2016-06-04 DIAGNOSIS — R0602 Shortness of breath: Secondary | ICD-10-CM

## 2016-06-04 DIAGNOSIS — F329 Major depressive disorder, single episode, unspecified: Secondary | ICD-10-CM | POA: Diagnosis present

## 2016-06-04 DIAGNOSIS — I10 Essential (primary) hypertension: Secondary | ICD-10-CM | POA: Diagnosis present

## 2016-06-04 DIAGNOSIS — F17210 Nicotine dependence, cigarettes, uncomplicated: Secondary | ICD-10-CM | POA: Diagnosis present

## 2016-06-04 DIAGNOSIS — Z8249 Family history of ischemic heart disease and other diseases of the circulatory system: Secondary | ICD-10-CM

## 2016-06-04 DIAGNOSIS — M549 Dorsalgia, unspecified: Secondary | ICD-10-CM | POA: Diagnosis present

## 2016-06-04 DIAGNOSIS — Z888 Allergy status to other drugs, medicaments and biological substances status: Secondary | ICD-10-CM

## 2016-06-04 DIAGNOSIS — J45909 Unspecified asthma, uncomplicated: Secondary | ICD-10-CM | POA: Diagnosis present

## 2016-06-04 DIAGNOSIS — G5640 Causalgia of unspecified upper limb: Secondary | ICD-10-CM | POA: Diagnosis present

## 2016-06-04 LAB — COMPREHENSIVE METABOLIC PANEL
ALT: 12 U/L — ABNORMAL LOW (ref 14–54)
AST: 37 U/L (ref 15–41)
Albumin: 3.6 g/dL (ref 3.5–5.0)
Alkaline Phosphatase: 57 U/L (ref 38–126)
Anion gap: 9 (ref 5–15)
BUN: 8 mg/dL (ref 6–20)
CHLORIDE: 100 mmol/L — AB (ref 101–111)
CO2: 28 mmol/L (ref 22–32)
Calcium: 9.7 mg/dL (ref 8.9–10.3)
Creatinine, Ser: 0.83 mg/dL (ref 0.44–1.00)
GFR calc Af Amer: >60 mL/min (ref >60)
Glucose, Bld: 144 mg/dL — ABNORMAL HIGH (ref 65–99)
POTASSIUM: 3.7 mmol/L (ref 3.5–5.1)
SODIUM: 137 mmol/L (ref 135–145)
Total Bilirubin: 1.1 mg/dL (ref 0.3–1.2)
Total Protein: 7.4 g/dL (ref 6.5–8.1)

## 2016-06-04 LAB — I-STAT CG4 LACTIC ACID, ED
LACTIC ACID, VENOUS: 0.57 mmol/L (ref 0.5–1.9)
Lactic Acid, Venous: 2.39 mmol/L (ref 0.5–1.9)

## 2016-06-04 LAB — CBC WITH DIFFERENTIAL/PLATELET
BASOS ABS: 0 10*3/uL (ref 0.0–0.1)
Basophils Relative: 0 %
EOS ABS: 0.1 10*3/uL (ref 0.0–0.7)
EOS PCT: 1 %
HCT: 39 % (ref 36.0–46.0)
HEMOGLOBIN: 12.8 g/dL (ref 12.0–15.0)
Lymphocytes Relative: 20 %
Lymphs Abs: 1.9 10*3/uL (ref 0.7–4.0)
MCH: 27.9 pg (ref 26.0–34.0)
MCHC: 32.8 g/dL (ref 30.0–36.0)
MCV: 85.2 fL (ref 78.0–100.0)
Monocytes Absolute: 0.4 10*3/uL (ref 0.1–1.0)
Monocytes Relative: 4 %
NEUTROS PCT: 75 %
Neutro Abs: 7.2 10*3/uL (ref 1.7–7.7)
PLATELETS: 266 10*3/uL (ref 150–400)
RBC: 4.58 MIL/uL (ref 3.87–5.11)
RDW: 14.2 % (ref 11.5–15.5)
WBC: 9.6 10*3/uL (ref 4.0–10.5)

## 2016-06-04 LAB — I-STAT BETA HCG BLOOD, ED (MC, WL, AP ONLY)

## 2016-06-04 LAB — I-STAT TROPONIN, ED: Troponin i, poc: 0.03 ng/mL (ref 0.00–0.08)

## 2016-06-04 MED ORDER — SODIUM CHLORIDE 0.9 % IV BOLUS (SEPSIS)
1000.0000 mL | Freq: Once | INTRAVENOUS | Status: AC
  Administered 2016-06-04: 1000 mL via INTRAVENOUS

## 2016-06-04 MED ORDER — DEXTROSE 5 % IV SOLN
1.0000 g | INTRAVENOUS | Status: DC
  Administered 2016-06-04: 1 g via INTRAVENOUS
  Filled 2016-06-04: qty 10

## 2016-06-04 MED ORDER — ACETAMINOPHEN 325 MG PO TABS
650.0000 mg | ORAL_TABLET | Freq: Once | ORAL | Status: AC
  Administered 2016-06-04: 650 mg via ORAL

## 2016-06-04 MED ORDER — DEXTROSE 5 % IV SOLN
500.0000 mg | INTRAVENOUS | Status: DC
  Administered 2016-06-04: 500 mg via INTRAVENOUS
  Filled 2016-06-04: qty 500

## 2016-06-04 MED ORDER — DEXTROSE 5 % IV SOLN: 1.0000 g | Freq: Once | INTRAVENOUS | Status: DC

## 2016-06-04 MED ORDER — DEXTROSE 5 % IV SOLN: 500.0000 mg | Freq: Once | INTRAVENOUS | Status: DC

## 2016-06-04 MED ORDER — OXYCODONE HCL 5 MG PO TABS
15.0000 mg | ORAL_TABLET | Freq: Once | ORAL | Status: AC
  Administered 2016-06-04: 15 mg via ORAL
  Filled 2016-06-04: qty 3

## 2016-06-04 MED ORDER — ACETAMINOPHEN 325 MG PO TABS
ORAL_TABLET | ORAL | Status: AC
  Filled 2016-06-04: qty 2

## 2016-06-04 NOTE — ED Notes (Signed)
Pt spo2 was 88% on 2L, no distress noted from pt but increased [place] to 3L which brought spo2 back to 92% 3L [place]

## 2016-06-04 NOTE — ED Provider Notes (Signed)
[facility] DEPT Provider Note   CSN: [id number] Arrival date & time: 06/04/16  2002     History   Chief Complaint Chief Complaint  Patient presents with  . Fever  . Cough  . Chest Pain  . Code Sepsis    HPI [place] is a 50 y.o. female.  HPI   1wk cough, 2 times coughed up green colored sputum, fevers at home over last few days, Chest pain and shortness of breath beginning yesterday and worsening alst night. Sharp chest pain left side, severe. Hx of CRPS of left arm.  No leg pain or swellling, no hx of PE.   Past Medical History:  Diagnosis Date  . Anemia   . Anxiety   . Asthma   . Chronic back pain   . Chronic neck pain   . Complex regional pain syndrome   . GERD (gastroesophageal reflux disease)   . Hematuria 09/07/2013  . Hypertension   . IBS (irritable bowel syndrome)   . Migraine   . Paresthesia of left arm     Patient Active Problem List   Diagnosis Date Noted  . CAP (community acquired pneumonia) 06/05/2016  . Acute respiratory failure with hypoxia ([place]) 06/05/2016  . 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) 06/27/2014  . Spinal stenosis in cervical region 06/27/2014  . Back pain 09/07/2013  . Hematuria 09/07/2013  . Endometrium, polyp 08/17/2013  . Anemia 08/13/2013    Past Surgical History:  Procedure Laterality Date  . DILITATION & CURRETTAGE/HYSTROSCOPY WITH THERMACHOICE ABLATION N/A 08/17/2013   Procedure: DILATATION & CURETTAGE/HYSTEROSCOPY WITH THERMACHOICE ABLATION;  Surgeon: Jonnie Kind, MD;  Location: [facility];  Service: Gynecology;  Laterality: N/A;  18 ml D5W in & out; total therapy time= 9 minutes 33 seconds; temp= 87 degrees celcius  . HERNIA REPAIR     ventral hernia  . POLYPECTOMY N/A 08/17/2013   Procedure: REMOVAL ENDOMETRIAL POLYP;  Surgeon: Jonnie Kind, MD;  Location: [facility];  Service: Gynecology;  Laterality: N/A;  . TUBAL LIGATION    . ULNAR NERVE REPAIR Left    [facility] 2013    OB History    Gravida Para  Term Preterm AB Living   4 4 4     4    SAB TAB Ectopic Multiple Live Births                   Home Medications    Prior to Admission medications   Medication Sig Start Date End Date Taking? Authorizing Provider  albuterol (PROVENTIL HFA;VENTOLIN HFA) 108 (90 BASE) MCG/ACT inhaler Inhale 2 puffs into the lungs every 6 (six) hours as needed for wheezing or shortness of breath.    Yes Historical Provider, MD  ALPRAZolam Duanne Moron) 1 MG tablet Take 1 mg by mouth 3 (three) times daily.   Yes Historical Provider, MD  divalproex (DEPAKOTE) 500 MG DR tablet Take 500 mg by mouth 2 (two) times daily.  04/15/16  Yes Historical Provider, MD  DULoxetine (CYMBALTA) 60 MG capsule Take 1 capsule (60 mg total) by mouth daily. 07/29/14  Yes Charlett Blake, MD  gabapentin (NEURONTIN) 300 MG capsule Take 600 mg by mouth 3 (three) times daily. 05/14/16  Yes Historical Provider, MD  LINZESS 145 MCG CAPS capsule Take 145 mcg by mouth daily.  05/10/14  Yes Historical Provider, MD  lisinopril-hydrochlorothiazide (PRINZIDE,ZESTORETIC) 20-25 MG per tablet Take 1 tablet by mouth daily. 05/10/14  Yes Historical Provider, MD  oxyCODONE (ROXICODONE) 15 MG immediate  release tablet Take 15 mg by mouth every 4 (four) hours as needed for pain. As needed 04/15/16  Yes Historical Provider, MD  promethazine (PHENERGAN) 25 MG tablet Take 25 mg by mouth every 6 (six) hours as needed for nausea or vomiting.   Yes Historical Provider, MD  tiZANidine (ZANAFLEX) 4 MG tablet Take 1 tablet by mouth 3 (three) times daily. 06/03/15  Yes Historical Provider, MD  topiramate (TOPAMAX) 100 MG tablet Take 200 mg by mouth at bedtime.  06/06/14  Yes Historical Provider, MD  traMADol (ULTRAM) 50 MG tablet Take 50 mg by mouth 2 (two) times daily as needed for moderate pain.  04/14/16  Yes Historical Provider, MD  zolpidem (AMBIEN) 10 MG tablet Take 10 mg by mouth at bedtime as needed for sleep.    Yes Historical Provider, MD    Family History Family  History  Problem Relation Age of Onset  . Diabetes Sister   . Hypertension Sister   . Heart disease Sister   . Cancer Sister     brain tumor  . Heart disease Brother   . Diabetes Brother   . Cancer Maternal Aunt     uterine  . Cancer Maternal Grandmother     uterine  . Other Mother     car accident    Social History Social History  Substance Use Topics  . Smoking status: Current Every Day Smoker    Packs/day: 1.00    Years: 26.00    Types: Cigarettes  . Smokeless tobacco: Never Used     Comment: 04/23/16 1/2- 3/4 PPD  . Alcohol use No     Allergies   Other   Review of Systems Review of Systems  Constitutional: Positive for chills, fatigue and fever.  HENT: Negative for sore throat.   Eyes: Negative for visual disturbance.  Respiratory: Positive for cough and shortness of breath.   Cardiovascular: Positive for chest pain. Negative for leg swelling.  Gastrointestinal: Negative for abdominal pain, nausea and vomiting.  Genitourinary: Negative for difficulty urinating.  Musculoskeletal: Positive for back pain. Negative for neck pain.  Skin: Negative for rash.  Neurological: Negative for syncope and headaches.     Physical Exam Updated Vital Signs BP 120/68 (BP Location: Right Arm)   Pulse 95   Temp 100.3 F (37.9 C) (Oral)   Resp 18   Ht 5\' 6"  (1.676 m)   Wt 206 lb 8 oz (93.7 kg)   SpO2 93%   BMI 33.33 kg/m   Physical Exam  Constitutional: She is oriented to person, place, and time. She appears well-developed and well-nourished. No distress.  HENT:  Head: Normocephalic and atraumatic.  Eyes: Conjunctivae and EOM are normal.  Neck: Normal range of motion.  Cardiovascular: Regular rhythm, normal heart sounds and intact distal pulses.  Tachycardia present.  Exam reveals no gallop and no friction rub.   No murmur heard. Pulmonary/Chest: Effort normal. No respiratory distress. She has no wheezes. She has rhonchi. She has no rales.  Abdominal: Soft. She  exhibits no distension. There is no tenderness. There is no guarding.  Musculoskeletal: She exhibits no edema or tenderness.  Neurological: She is alert and oriented to person, place, and time.  Skin: Skin is warm and dry. No rash noted. She is not diaphoretic. No erythema.  Nursing note and vitals reviewed.    ED Treatments / Results  Labs (all labs ordered are listed, but only abnormal results are displayed) Labs Reviewed  COMPREHENSIVE METABOLIC PANEL - Abnormal; Notable  for the following:       Result Value   Chloride 100 (*)    Glucose, Bld 144 (*)    ALT 12 (*)    All other components within normal limits  URINALYSIS, ROUTINE W REFLEX MICROSCOPIC (NOT AT [facility]) - Abnormal; Notable for the following:    APPearance CLOUDY (*)    Leukocytes, UA TRACE (*)    All other components within normal limits  URINE MICROSCOPIC-ADD ON - Abnormal; Notable for the following:    Squamous Epithelial / LPF 6-30 (*)    Bacteria, UA MANY (*)    All other components within normal limits  I-STAT CG4 LACTIC ACID, ED - Abnormal; Notable for the following:    Lactic Acid, Venous 2.39 (*)    All other components within normal limits  CULTURE, BLOOD (ROUTINE X 2)  CULTURE, BLOOD (ROUTINE X 2)  URINE CULTURE  CULTURE, EXPECTORATED SPUTUM-ASSESSMENT  GRAM STAIN  CBC WITH DIFFERENTIAL/PLATELET  BRAIN NATRIURETIC PEPTIDE  STREP PNEUMONIAE URINARY ANTIGEN  HIV ANTIBODY (ROUTINE TESTING)  I-STAT BETA HCG BLOOD, ED ([facility], [facility], [facility] ONLY)  I-STAT CG4 LACTIC ACID, ED  I-STAT TROPOININ, ED  POC URINE PREG, ED  I-STAT TROPOININ, ED  I-STAT TROPOININ, ED    EKG  EKG Interpretation  Date/Time:  Tuesday June 04 2016 20:09:02 EDT Ventricular Rate:  110 PR Interval:  124 QRS Duration: 88 QT Interval:  312 QTC Calculation: 422 R Axis:   67 Text Interpretation:  Sinus tachycardia Nonspecific ST and T wave abnormality Abnormal ECG ST changes in inferior and lateral leads new from prior Confirmed by  [facility] MD, [place] ([id number]) on 06/04/2016 11:02:04 PM       Radiology Dg Chest 2 View  Result Date: 06/04/2016 CLINICAL DATA:  Initial evaluation for code sepsis. EXAM: CHEST  2 VIEW COMPARISON:  Prior radiograph from 12/06/2014. FINDINGS: Cardiac and mediastinal silhouettes are within normal limits. Trach air column midline and patent. Lungs are mildly hypoinflated. There is diffuse vascular congestion with interstitial prominence, which may reflect mild diffuse pulmonary interstitial edema or possibly multi focal infection. No pleural effusion. No pneumothorax. Osseous structures within normal limits. IMPRESSION: Diffuse vascular congestion with interstitial prominence, which may reflect pulmonary interstitial edema and/ or multi focal infection. Electronically Signed   By: Jeannine Boga M.D.   On: 06/04/2016 22:35    Procedures Procedures (including critical care time)  Medications Ordered in ED Medications  albuterol (PROVENTIL) (2.5 MG/3ML) 0.083% nebulizer solution (  Not Given 06/05/16 0024)  DULoxetine (CYMBALTA) DR capsule 60 mg (60 mg Oral Given 06/05/16 0949)  divalproex (DEPAKOTE) DR tablet 500 mg (500 mg Oral Given 06/05/16 0949)  ALPRAZolam (XANAX) tablet 1 mg (1 mg Oral Given 06/05/16 0949)  albuterol (PROVENTIL) (2.5 MG/3ML) 0.083% nebulizer solution 2.5 mg (not administered)  gabapentin (NEURONTIN) capsule 800 mg (800 mg Oral Given 06/05/16 0949)  linaclotide (LINZESS) capsule 145 mcg (not administered)  oxyCODONE (Oxy IR/ROXICODONE) immediate release tablet 15 mg (15 mg Oral Given 06/05/16 1043)  zolpidem (AMBIEN) tablet 5 mg (5 mg Oral Given 06/05/16 0518)  traMADol (ULTRAM) tablet 50 mg (not administered)  topiramate (TOPAMAX) tablet 200 mg (200 mg Oral Given 06/05/16 0519)  tiZANidine (ZANAFLEX) tablet 4 mg (4 mg Oral Given 06/05/16 0949)  promethazine (PHENERGAN) tablet 25 mg (not administered)  enoxaparin (LOVENOX) injection 40 mg (40 mg Subcutaneous Given 06/05/16  0950)  cefTRIAXone (ROCEPHIN) 1 g in dextrose 5 % 50 mL IVPB (not administered)  azithromycin (ZITHROMAX) 500 mg in  dextrose 5 % 250 mL IVPB (not administered)  lisinopril (PRINIVIL,ZESTRIL) tablet 20 mg (20 mg Oral Given 06/05/16 0949)    And  hydrochlorothiazide (HYDRODIURIL) tablet 25 mg (25 mg Oral Given 06/05/16 0949)  albuterol (PROVENTIL) (2.5 MG/3ML) 0.083% nebulizer solution 2.5 mg (not administered)  ipratropium-albuterol (DUONEB) 0.5-2.5 (3) MG/3ML nebulizer solution 3 mL (3 mLs Nebulization Given 06/05/16 0939)  budesonide (PULMICORT) nebulizer solution 0.25 mg (not administered)  guaiFENesin (MUCINEX) 12 hr tablet 600 mg (not administered)  acetaminophen (TYLENOL) tablet 650 mg (650 mg Oral Given 06/04/16 2015)  sodium chloride 0.9 % bolus 1,000 mL (0 mLs Intravenous Stopped 06/05/16 0025)  sodium chloride 0.9 % bolus 1,000 mL (0 mLs Intravenous Stopped 06/05/16 0025)    And  sodium chloride 0.9 % bolus 1,000 mL (0 mLs Intravenous Stopped 06/05/16 0025)    And  sodium chloride 0.9 % bolus 1,000 mL (0 mLs Intravenous Stopped 06/05/16 0025)  oxyCODONE (Oxy IR/ROXICODONE) immediate release tablet 15 mg (15 mg Oral Given 06/04/16 2126)  ALPRAZolam (XANAX) tablet 1 mg (1 mg Oral Given 06/05/16 0024)  ipratropium-albuterol (DUONEB) 0.5-2.5 (3) MG/3ML nebulizer solution 3 mL (3 mLs Nebulization Given 06/05/16 0024)     Initial Impression / Assessment and Plan / ED Course  I have reviewed the triage vital signs and the nursing notes.  Pertinent labs & imaging results that were available during my care of the patient were reviewed by me and considered in my medical decision making (see chart for details).  Clinical Course   50yo female presents with concern for cough, fever, shortness of breath. Pt hypoxic on arrival to 86% on room air, tachycardic and febrile. Pt given 30cc/kg NS, rocephin/azithromycin for concern for community acquired pneumonia. CXR shows opacities, edema versus multifocal  pneumonia. Troponin and BNP WNL and feel history is more consistent with pneumonia.   Patient admitted to telemetry by Dr. Hurley Cisco  Final Clinical Impressions(s) / ED Diagnoses   Final diagnoses:  CAP (community acquired pneumonia)  Sepsis, due to unspecified organism [facility])  Acute respiratory failure with hypoxia [facility])    New Prescriptions Current Discharge Medication List       Gareth Morgan, MD 06/05/16 1150

## 2016-06-04 NOTE — ED Notes (Signed)
Pt notified that urine sample is needed 

## 2016-06-04 NOTE — ED Triage Notes (Signed)
Pt sent by PCP for fever, cough and chest pain x1 week.Low sats and fever in triage. No OTC meds for fever.

## 2016-06-04 NOTE — ED Notes (Signed)
Patient transported to X-ray 

## 2016-06-04 NOTE — ED Notes (Signed)
Called [facility] to activate code sepsis  

## 2016-06-04 NOTE — ED Notes (Signed)
Notified Schlossman MD of code sepsis in triage.

## 2016-06-04 NOTE — ED Notes (Signed)
Told pt we need urine sample, pt states she cannot go to the bathroom at this time.

## 2016-06-04 NOTE — ED Notes (Signed)
Spoke with radiology who will get pt in triage and take to get her xray and then return to room 12

## 2016-06-05 DIAGNOSIS — G5642 Causalgia of left upper limb: Secondary | ICD-10-CM

## 2016-06-05 DIAGNOSIS — M549 Dorsalgia, unspecified: Secondary | ICD-10-CM | POA: Diagnosis present

## 2016-06-05 DIAGNOSIS — F329 Major depressive disorder, single episode, unspecified: Secondary | ICD-10-CM | POA: Diagnosis present

## 2016-06-05 DIAGNOSIS — K589 Irritable bowel syndrome without diarrhea: Secondary | ICD-10-CM | POA: Diagnosis present

## 2016-06-05 DIAGNOSIS — F418 Other specified anxiety disorders: Secondary | ICD-10-CM | POA: Diagnosis not present

## 2016-06-05 DIAGNOSIS — G5640 Causalgia of unspecified upper limb: Secondary | ICD-10-CM | POA: Diagnosis not present

## 2016-06-05 DIAGNOSIS — J9601 Acute respiratory failure with hypoxia: Secondary | ICD-10-CM | POA: Diagnosis present

## 2016-06-05 DIAGNOSIS — M542 Cervicalgia: Secondary | ICD-10-CM | POA: Diagnosis present

## 2016-06-05 DIAGNOSIS — G43909 Migraine, unspecified, not intractable, without status migrainosus: Secondary | ICD-10-CM | POA: Diagnosis present

## 2016-06-05 DIAGNOSIS — Z79899 Other long term (current) drug therapy: Secondary | ICD-10-CM | POA: Diagnosis not present

## 2016-06-05 DIAGNOSIS — F419 Anxiety disorder, unspecified: Secondary | ICD-10-CM | POA: Diagnosis present

## 2016-06-05 DIAGNOSIS — M4802 Spinal stenosis, cervical region: Secondary | ICD-10-CM | POA: Diagnosis present

## 2016-06-05 DIAGNOSIS — K219 Gastro-esophageal reflux disease without esophagitis: Secondary | ICD-10-CM | POA: Diagnosis present

## 2016-06-05 DIAGNOSIS — F17210 Nicotine dependence, cigarettes, uncomplicated: Secondary | ICD-10-CM | POA: Diagnosis present

## 2016-06-05 DIAGNOSIS — J189 Pneumonia, unspecified organism: Secondary | ICD-10-CM | POA: Diagnosis present

## 2016-06-05 DIAGNOSIS — Z8249 Family history of ischemic heart disease and other diseases of the circulatory system: Secondary | ICD-10-CM | POA: Diagnosis not present

## 2016-06-05 DIAGNOSIS — J45909 Unspecified asthma, uncomplicated: Secondary | ICD-10-CM | POA: Diagnosis present

## 2016-06-05 DIAGNOSIS — Z888 Allergy status to other drugs, medicaments and biological substances status: Secondary | ICD-10-CM | POA: Diagnosis not present

## 2016-06-05 DIAGNOSIS — R0602 Shortness of breath: Secondary | ICD-10-CM | POA: Diagnosis not present

## 2016-06-05 DIAGNOSIS — G894 Chronic pain syndrome: Secondary | ICD-10-CM | POA: Diagnosis present

## 2016-06-05 DIAGNOSIS — Z79891 Long term (current) use of opiate analgesic: Secondary | ICD-10-CM | POA: Diagnosis not present

## 2016-06-05 DIAGNOSIS — R6510 Systemic inflammatory response syndrome (SIRS) of non-infectious origin without acute organ dysfunction: Secondary | ICD-10-CM | POA: Diagnosis present

## 2016-06-05 DIAGNOSIS — I10 Essential (primary) hypertension: Secondary | ICD-10-CM | POA: Diagnosis present

## 2016-06-05 LAB — URINE MICROSCOPIC-ADD ON

## 2016-06-05 LAB — URINALYSIS, ROUTINE W REFLEX MICROSCOPIC
Bilirubin Urine: NEGATIVE
GLUCOSE, UA: NEGATIVE mg/dL
Hgb urine dipstick: NEGATIVE
Ketones, ur: NEGATIVE mg/dL
Nitrite: NEGATIVE
PH: 6 (ref 5.0–8.0)
PROTEIN: NEGATIVE mg/dL
Specific Gravity, Urine: 1.016 (ref 1.005–1.030)

## 2016-06-05 LAB — HIV ANTIBODY (ROUTINE TESTING W REFLEX): HIV Screen 4th Generation wRfx: NONREACTIVE

## 2016-06-05 LAB — STREP PNEUMONIAE URINARY ANTIGEN: STREP PNEUMO URINARY ANTIGEN: NEGATIVE

## 2016-06-05 LAB — BRAIN NATRIURETIC PEPTIDE: B Natriuretic Peptide: 42.9 pg/mL (ref 0.0–100.0)

## 2016-06-05 LAB — POC URINE PREG, ED: Preg Test, Ur: NEGATIVE

## 2016-06-05 MED ORDER — PROMETHAZINE HCL 25 MG PO TABS
25.0000 mg | ORAL_TABLET | Freq: Four times a day (QID) | ORAL | Status: DC | PRN
Start: 2016-06-05 — End: 2016-06-07
  Administered 2016-06-06: 25 mg via ORAL
  Filled 2016-06-05: qty 1

## 2016-06-05 MED ORDER — LISINOPRIL 20 MG PO TABS
20.0000 mg | ORAL_TABLET | Freq: Every day | ORAL | Status: DC
  Administered 2016-06-05 – 2016-06-07 (×3): 20 mg via ORAL
  Filled 2016-06-05 (×3): qty 1

## 2016-06-05 MED ORDER — DEXTROSE 5 % IV SOLN
500.0000 mg | INTRAVENOUS | Status: DC
Start: 2016-06-05 — End: 2016-06-07
  Administered 2016-06-05 – 2016-06-06 (×2): 500 mg via INTRAVENOUS
  Filled 2016-06-05 (×3): qty 500

## 2016-06-05 MED ORDER — ENOXAPARIN SODIUM 40 MG/0.4ML ~~LOC~~ SOLN
40.0000 mg | Freq: Every day | SUBCUTANEOUS | Status: DC
  Administered 2016-06-05 – 2016-06-07 (×3): 40 mg via SUBCUTANEOUS
  Filled 2016-06-05 (×3): qty 0.4

## 2016-06-05 MED ORDER — IPRATROPIUM-ALBUTEROL 0.5-2.5 (3) MG/3ML IN SOLN
3.0000 mL | Freq: Once | RESPIRATORY_TRACT | Status: AC
  Administered 2016-06-05: 3 mL via RESPIRATORY_TRACT

## 2016-06-05 MED ORDER — ALBUTEROL SULFATE (2.5 MG/3ML) 0.083% IN NEBU: 2.5000 mg | INHALATION_SOLUTION | RESPIRATORY_TRACT | Status: DC | PRN

## 2016-06-05 MED ORDER — ALPRAZOLAM 0.5 MG PO TABS
1.0000 mg | ORAL_TABLET | Freq: Three times a day (TID) | ORAL | Status: DC
  Administered 2016-06-05 – 2016-06-07 (×7): 1 mg via ORAL
  Filled 2016-06-05 (×7): qty 2

## 2016-06-05 MED ORDER — ALBUTEROL SULFATE (2.5 MG/3ML) 0.083% IN NEBU: 2.5000 mg | INHALATION_SOLUTION | Freq: Four times a day (QID) | RESPIRATORY_TRACT | Status: DC | PRN

## 2016-06-05 MED ORDER — HYDROCHLOROTHIAZIDE 25 MG PO TABS
25.0000 mg | ORAL_TABLET | Freq: Every day | ORAL | Status: DC
  Administered 2016-06-05 – 2016-06-07 (×3): 25 mg via ORAL
  Filled 2016-06-05 (×3): qty 1

## 2016-06-05 MED ORDER — TIZANIDINE HCL 4 MG PO TABS
4.0000 mg | ORAL_TABLET | Freq: Three times a day (TID) | ORAL | Status: DC
  Administered 2016-06-05 – 2016-06-07 (×8): 4 mg via ORAL
  Filled 2016-06-05 (×8): qty 1

## 2016-06-05 MED ORDER — OXYCODONE HCL 5 MG PO TABS
15.0000 mg | ORAL_TABLET | ORAL | Status: DC | PRN
  Administered 2016-06-05 – 2016-06-06 (×5): 15 mg via ORAL
  Filled 2016-06-05 (×5): qty 3

## 2016-06-05 MED ORDER — GABAPENTIN 400 MG PO CAPS
800.0000 mg | ORAL_CAPSULE | Freq: Three times a day (TID) | ORAL | Status: DC
  Administered 2016-06-05 – 2016-06-07 (×8): 800 mg via ORAL
  Filled 2016-06-05 (×8): qty 2

## 2016-06-05 MED ORDER — DIVALPROEX SODIUM 250 MG PO DR TAB
500.0000 mg | DELAYED_RELEASE_TABLET | Freq: Two times a day (BID) | ORAL | Status: DC
  Administered 2016-06-05 – 2016-06-07 (×6): 500 mg via ORAL
  Filled 2016-06-05 (×6): qty 2

## 2016-06-05 MED ORDER — TRAMADOL HCL 50 MG PO TABS
50.0000 mg | ORAL_TABLET | Freq: Four times a day (QID) | ORAL | Status: DC | PRN
  Administered 2016-06-06: 50 mg via ORAL
  Filled 2016-06-05: qty 1

## 2016-06-05 MED ORDER — BUDESONIDE 0.25 MG/2ML IN SUSP
0.2500 mg | Freq: Two times a day (BID) | RESPIRATORY_TRACT | Status: DC
  Administered 2016-06-05 – 2016-06-07 (×4): 0.25 mg via RESPIRATORY_TRACT
  Filled 2016-06-05 (×4): qty 2

## 2016-06-05 MED ORDER — CEFTRIAXONE SODIUM 1 G IJ SOLR
1.0000 g | INTRAMUSCULAR | Status: DC
  Administered 2016-06-05 – 2016-06-06 (×2): 1 g via INTRAVENOUS
  Filled 2016-06-05 (×3): qty 10

## 2016-06-05 MED ORDER — LINACLOTIDE 145 MCG PO CAPS
145.0000 ug | ORAL_CAPSULE | Freq: Every day | ORAL | Status: DC
  Administered 2016-06-05 – 2016-06-06 (×2): 145 ug via ORAL
  Filled 2016-06-05 (×3): qty 1

## 2016-06-05 MED ORDER — ALPRAZOLAM 0.25 MG PO TABS
1.0000 mg | ORAL_TABLET | Freq: Once | ORAL | Status: AC
  Administered 2016-06-05: 1 mg via ORAL
  Filled 2016-06-05: qty 4

## 2016-06-05 MED ORDER — ZOLPIDEM TARTRATE 5 MG PO TABS
5.0000 mg | ORAL_TABLET | Freq: Every evening | ORAL | Status: DC | PRN
  Administered 2016-06-05 – 2016-06-06 (×3): 5 mg via ORAL
  Filled 2016-06-05 (×3): qty 1

## 2016-06-05 MED ORDER — ALBUTEROL SULFATE (2.5 MG/3ML) 0.083% IN NEBU
INHALATION_SOLUTION | RESPIRATORY_TRACT | Status: AC
  Filled 2016-06-05: qty 6

## 2016-06-05 MED ORDER — TOPIRAMATE 100 MG PO TABS
200.0000 mg | ORAL_TABLET | Freq: Every day | ORAL | Status: DC
  Administered 2016-06-05 – 2016-06-06 (×3): 200 mg via ORAL
  Filled 2016-06-05 (×3): qty 2

## 2016-06-05 MED ORDER — DULOXETINE HCL 60 MG PO CPEP
60.0000 mg | ORAL_CAPSULE | Freq: Every day | ORAL | Status: DC
  Administered 2016-06-05 – 2016-06-07 (×3): 60 mg via ORAL
  Filled 2016-06-05 (×3): qty 1

## 2016-06-05 MED ORDER — IPRATROPIUM-ALBUTEROL 0.5-2.5 (3) MG/3ML IN SOLN
3.0000 mL | Freq: Three times a day (TID) | RESPIRATORY_TRACT | Status: DC
  Administered 2016-06-05 – 2016-06-07 (×7): 3 mL via RESPIRATORY_TRACT
  Filled 2016-06-05 (×8): qty 3

## 2016-06-05 MED ORDER — LISINOPRIL-HYDROCHLOROTHIAZIDE 20-25 MG PO TABS: 1.0000 | ORAL_TABLET | Freq: Every day | ORAL | Status: DC

## 2016-06-05 MED ORDER — GUAIFENESIN ER 600 MG PO TB12
600.0000 mg | ORAL_TABLET | Freq: Two times a day (BID) | ORAL | Status: DC
  Administered 2016-06-05 – 2016-06-07 (×5): 600 mg via ORAL
  Filled 2016-06-05 (×5): qty 1

## 2016-06-05 NOTE — H&P (Addendum)
History and Physical    [facility] PATIENT [id number] DOB: [birth year] DOA: 06/04/2016   PCP: Ron Parker, MD Chief Complaint:  Chief Complaint  Patient presents with  . Fever  . Cough  . Chest Pain  . Code Sepsis    HPI: Joan [name] is a 50 y.o. female with medical history significant of chronic pain, smoking.  Patient presents to the ED with c/o 1 week of cough and chest pain.  No known sick contacts.  Symptoms are persistent and worsening since onset.  Nothing makes better or worse.  ED Course: Tm 101.7, CXR is read as either pulmonary edema or multifocal infection.  Patient started on CAP treatment.  Review of Systems: As per HPI otherwise 10 point review of systems negative.    Past Medical History:  Diagnosis Date  . Anemia   . Anxiety   . Asthma   . Chronic back pain   . Chronic neck pain   . Complex regional pain syndrome   . GERD (gastroesophageal reflux disease)   . Hematuria 09/07/2013  . Hypertension   . IBS (irritable bowel syndrome)   . Migraine   . Paresthesia of left arm     Past Surgical History:  Procedure Laterality Date  . DILITATION & CURRETTAGE/HYSTROSCOPY WITH THERMACHOICE ABLATION N/A 08/17/2013   Procedure: DILATATION & CURETTAGE/HYSTEROSCOPY WITH THERMACHOICE ABLATION;  Surgeon: Jonnie Kind, MD;  Location: [facility];  Service: Gynecology;  Laterality: N/A;  18 ml D5W in & out; total therapy time= 9 minutes 33 seconds; temp= 87 degrees celcius  . HERNIA REPAIR     ventral hernia  . POLYPECTOMY N/A 08/17/2013   Procedure: REMOVAL ENDOMETRIAL POLYP;  Surgeon: Jonnie Kind, MD;  Location: [facility];  Service: Gynecology;  Laterality: N/A;  . TUBAL LIGATION    . ULNAR NERVE REPAIR Left    [facility] 2013     reports that she has been smoking Cigarettes.  She has a 26.00 pack-year smoking history. She has never used smokeless tobacco. She reports that she does not drink alcohol or use drugs.  Allergies  Allergen Reactions  . Other Other (See  Comments)    IV meds for migraine, patient stated it made her feel like something was crawling all over her body.    Family History  Problem Relation Age of Onset  . Diabetes Sister   . Hypertension Sister   . Heart disease Sister   . Cancer Sister     brain tumor  . Heart disease Brother   . Diabetes Brother   . Cancer Maternal Aunt     uterine  . Cancer Maternal Grandmother     uterine  . Other Mother     car accident      Prior to Admission medications   Medication Sig Start Date End Date Taking? Authorizing Provider  ALPRAZolam Duanne Moron) 1 MG tablet Take 1 mg by mouth 3 (three) times daily.   Yes Historical Provider, MD  divalproex (DEPAKOTE) 500 MG DR tablet Take 500 mg by mouth 2 (two) times daily.  04/15/16  Yes Historical Provider, MD  DULoxetine (CYMBALTA) 60 MG capsule Take 1 capsule (60 mg total) by mouth daily. 07/29/14  Yes Charlett Blake, MD  gabapentin (NEURONTIN) 800 MG tablet Take 1 tablet (800 mg total) by mouth 3 (three) times daily. 06/30/15  Yes Gareth Morgan, MD  LINZESS 145 MCG CAPS capsule Take 145 mcg by mouth daily.  05/10/14  Yes Historical Provider, MD  lisinopril-hydrochlorothiazide (  PRINZIDE,ZESTORETIC) 20-25 MG per tablet Take 1 tablet by mouth daily. 05/10/14  Yes Historical Provider, MD  oxyCODONE (ROXICODONE) 15 MG immediate release tablet Take 15 mg by mouth every 4 (four) hours as needed for pain. As needed 04/15/16  Yes Historical Provider, MD  promethazine (PHENERGAN) 25 MG tablet Take 25 mg by mouth every 6 (six) hours as needed for nausea or vomiting.   Yes Historical Provider, MD  tiZANidine (ZANAFLEX) 4 MG tablet Take 1 tablet by mouth 3 (three) times daily. 06/03/15  Yes Historical Provider, MD  albuterol (PROVENTIL HFA;VENTOLIN HFA) 108 (90 BASE) MCG/ACT inhaler Inhale 2 puffs into the lungs every 6 (six) hours as needed for wheezing or shortness of breath.     Historical Provider, MD  topiramate (TOPAMAX) 100 MG tablet Take 200 mg by mouth  at bedtime.  06/06/14   Historical Provider, MD  traMADol (ULTRAM) 50 MG tablet Take 50 mg by mouth. As needed 04/14/16   Historical Provider, MD  zolpidem (AMBIEN) 10 MG tablet Take 10 mg by mouth at bedtime as needed for sleep.     Historical Provider, MD    Physical Exam: Vitals:   06/04/16 2245 06/04/16 2300 06/04/16 2315 06/05/16 0000  BP: 127/84 112/68 118/68 (!) 133/110  Pulse: 87 88 81 84  Resp: 16 (!) 27 22 22   Temp:      TempSrc:      SpO2: 92% (!) 89% 96% 91%  Weight:      Height:          Constitutional: NAD, calm, comfortable Eyes: PERRL, lids and conjunctivae normal ENMT: Mucous membranes are moist. Posterior pharynx clear of any exudate or lesions.Normal dentition.  Neck: normal, supple, no masses, no thyromegaly Respiratory: clear to auscultation bilaterally, no wheezing, no crackles. Normal respiratory effort. No accessory muscle use.  Cardiovascular: Regular rate and rhythm, no murmurs / rubs / gallops. No extremity edema. 2+ pedal pulses. No carotid bruits.  Abdomen: no tenderness, no masses palpated. No hepatosplenomegaly. Bowel sounds positive.  Musculoskeletal: no clubbing / cyanosis. No joint deformity upper and lower extremities. Good ROM, no contractures. Normal muscle tone.  Skin: no rashes, lesions, ulcers. No induration Neurologic: CN 2-12 grossly intact. Sensation intact, DTR normal. Strength 5/5 in all 4.  Psychiatric: Normal judgment and insight. Alert and oriented x 3. Normal mood.    Labs on Admission: I have personally reviewed following labs and imaging studies  CBC:  Recent Labs Lab 06/04/16 2011  WBC 9.6  NEUTROABS 7.2  HGB 12.8  HCT 39.0  MCV 85.2  PLT 123456   Basic Metabolic Panel:  Recent Labs Lab 06/04/16 2011  NA 137  K 3.7  CL 100*  CO2 28  GLUCOSE 144*  BUN 8  CREATININE 0.83  CALCIUM 9.7   GFR: Estimated Creatinine Clearance: 91.7 mL/min (by C-G formula based on SCr of 0.83 mg/dL). Liver Function Tests:  Recent  Labs Lab 06/04/16 2011  AST 37  ALT 12*  ALKPHOS 57  BILITOT 1.1  PROT 7.4  ALBUMIN 3.6   No results for input(s): LIPASE, AMYLASE in the last 168 hours. No results for input(s): AMMONIA in the last 168 hours. Coagulation Profile: No results for input(s): INR, PROTIME in the last 168 hours. Cardiac Enzymes: No results for input(s): CKTOTAL, CKMB, CKMBINDEX, TROPONINI in the last 168 hours. BNP (last 3 results) No results for input(s): PROBNP in the last 8760 hours. HbA1C: No results for input(s): HGBA1C in the last 72 hours. CBG: No  results for input(s): GLUCAP in the last 168 hours. Lipid Profile: No results for input(s): CHOL, HDL, LDLCALC, TRIG, CHOLHDL, LDLDIRECT in the last 72 hours. Thyroid Function Tests: No results for input(s): TSH, T4TOTAL, FREET4, T3FREE, THYROIDAB in the last 72 hours. Anemia Panel: No results for input(s): VITAMINB12, FOLATE, FERRITIN, TIBC, IRON, RETICCTPCT in the last 72 hours. Urine analysis:    Component Value Date/Time   COLORURINE YELLOW 07/31/2015 2028   APPEARANCEUR HAZY (A) 07/31/2015 2028   LABSPEC 1.014 07/31/2015 2028   PHURINE 8.0 07/31/2015 2028   GLUCOSEU NEGATIVE 07/31/2015 2028   HGBUR NEGATIVE 07/31/2015 2028   BILIRUBINUR NEGATIVE 07/31/2015 2028   KETONESUR NEGATIVE 07/31/2015 2028   PROTEINUR NEGATIVE 07/31/2015 2028   UROBILINOGEN 1.0 07/31/2015 2028   NITRITE NEGATIVE 07/31/2015 2028   LEUKOCYTESUR NEGATIVE 07/31/2015 2028   Sepsis Labs: @LABRCNTIP (procalcitonin:4,lacticidven:4) )No results found for this or any previous visit (from the past 240 hour(s)).   Radiological Exams on Admission: Dg Chest 2 View  Result Date: 06/04/2016 CLINICAL DATA:  Initial evaluation for code sepsis. EXAM: CHEST  2 VIEW COMPARISON:  Prior radiograph from 12/06/2014. FINDINGS: Cardiac and mediastinal silhouettes are within normal limits. Trach air column midline and patent. Lungs are mildly hypoinflated. There is diffuse vascular  congestion with interstitial prominence, which may reflect mild diffuse pulmonary interstitial edema or possibly multi focal infection. No pleural effusion. No pneumothorax. Osseous structures within normal limits. IMPRESSION: Diffuse vascular congestion with interstitial prominence, which may reflect pulmonary interstitial edema and/ or multi focal infection. Electronically Signed   By: Jeannine Boga M.D.   On: 06/04/2016 22:35    EKG: Independently reviewed.  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 CAP - causing acute resp failure with hypoxia 1. Rocephin and azithromycin 2. PNA pathway 3. Cultures pending 4. Continuous pulse ox 5. Had discussion about trying to quit smoking 2. CRPS - continue home meds, avoid escalation   DVT prophylaxis: Lovenox Code Status: Full Family Communication: Husband at bedside Consults called: None Admission status: Admit to inpatient   Etta Quill DO [facility] Pager [phone] from 7PM-7AM  If 7AM-7PM, please contact the day physician for the patient [email or web address] Password [email or web address]  06/05/2016, 1:23 AM

## 2016-06-05 NOTE — Progress Notes (Signed)
Patient seen and examined. Admitted after midnight secondary to SOB, fever and cough. Patient's symptoms present for over a week and worsening. Patient found to have O2 sat on 88% on RA, temp of 101.7 and her CXR demonstrated findings with concerns for multifocal infiltrates. Please refer to H&P written by Dr. Alcario Drought for further info/details on admission.  Plan: -will continue current IV antibiotics -start, mucinex, pulmicort and Flutter valve -patient with hx of Asthma and chronic tobacco abuse  -will follow clinical response  -patient counseled about smoking cessation    Barton Dubois [id number]

## 2016-06-05 NOTE — ED Notes (Signed)
Pt is very anxious and tachypneic, she states that she is sob.  Spo2 85% on 3LNC, she is reporting increased difficulty breathing and is anxious and reports sob and feeling cold.  Gave pt socks, warm blankets, calmed her and notified EDP, new orders received and pt responded well to breathing treatment

## 2016-06-05 NOTE — ED Notes (Signed)
Call to [facility]. Pt can be transported once room on 3E is cleaned, they will notify us

## 2016-06-05 NOTE — ED Notes (Signed)
Report given to 3E 

## 2016-06-05 NOTE — ED Notes (Signed)
Spoke with MD about pt continued sob and need to increase [place] to 4L.  New order received for wheezeprotocol, notified RT.

## 2016-06-05 NOTE — ED Notes (Signed)
Pt c/o chest pain after coughing. 3E RN aware.

## 2016-06-05 NOTE — ED Notes (Signed)
Increased [place] to 4L due to continued sob and spo2 88% on 3L [place]

## 2016-06-05 NOTE — ED Notes (Signed)
Admitting MD here to see pt.

## 2016-06-06 DIAGNOSIS — G5640 Causalgia of unspecified upper limb: Secondary | ICD-10-CM

## 2016-06-06 DIAGNOSIS — J9601 Acute respiratory failure with hypoxia: Secondary | ICD-10-CM

## 2016-06-06 DIAGNOSIS — F418 Other specified anxiety disorders: Secondary | ICD-10-CM

## 2016-06-06 LAB — URINE CULTURE

## 2016-06-06 LAB — BASIC METABOLIC PANEL
ANION GAP: 9 (ref 5–15)
BUN: 7 mg/dL (ref 6–20)
CALCIUM: 8.5 mg/dL — AB (ref 8.9–10.3)
CO2: 24 mmol/L (ref 22–32)
Chloride: 103 mmol/L (ref 101–111)
Creatinine, Ser: 0.8 mg/dL (ref 0.44–1.00)
Glucose, Bld: 93 mg/dL (ref 65–99)
POTASSIUM: 3.5 mmol/L (ref 3.5–5.1)
Sodium: 136 mmol/L (ref 135–145)

## 2016-06-06 LAB — CBC
HEMATOCRIT: 33.4 % — AB (ref 36.0–46.0)
Hemoglobin: 10.5 g/dL — ABNORMAL LOW (ref 12.0–15.0)
MCH: 27 pg (ref 26.0–34.0)
MCHC: 31.4 g/dL (ref 30.0–36.0)
MCV: 85.9 fL (ref 78.0–100.0)
PLATELETS: 288 10*3/uL (ref 150–400)
RBC: 3.89 MIL/uL (ref 3.87–5.11)
RDW: 14.5 % (ref 11.5–15.5)
WBC: 8.7 10*3/uL (ref 4.0–10.5)

## 2016-06-06 MED ORDER — TRAMADOL HCL 50 MG PO TABS
50.0000 mg | ORAL_TABLET | Freq: Three times a day (TID) | ORAL | Status: DC | PRN
  Administered 2016-06-07: 50 mg via ORAL
  Filled 2016-06-06: qty 1

## 2016-06-06 MED ORDER — OXYCODONE HCL 5 MG PO TABS
15.0000 mg | ORAL_TABLET | Freq: Four times a day (QID) | ORAL | Status: DC | PRN
  Administered 2016-06-06 – 2016-06-07 (×2): 15 mg via ORAL
  Filled 2016-06-06 (×2): qty 3

## 2016-06-06 NOTE — Progress Notes (Addendum)
Entered room for 1000 medication administration. Pt asleep slouched in bed with no apparent distress. Pt difficult to wake up after saying name loudly multiple times and continues to fall asleep during medication administration. Pt requesting oxycodone pain medication for 10/10 headache. Medications given to patient, patient barely able to hold medication cup while taking pills then quickly falls back asleep. Vital signs stable. Will continue to monitor.

## 2016-06-06 NOTE — Progress Notes (Signed)
[facility] PROGRESS NOTE  IMO ALKHATIB [id number] DOB: 06-06-1966 DOA: 06/04/2016 PCP: Ron Parker, MD  Interim summary and HPI 50 y.o. female with medical history significant of chronic pain, smoking.  Patient presents to the ED with c/o 1 week of cough and chest pain.  No known sick contacts.  Symptoms are persistent and worsening since onset.  Nothing makes better or worse.  ED Course: Tm 101.7, CXR is read as either pulmonary edema or multifocal infection.  Patient started on CAP treatment.  Assessment/Plan: 1-CAP: CURB 65 2 -improving -will continue current antibiotics -continue PRN antipyretics -follow culture data -will repeat CXR in am -patient started on pulmicort, mucinex and flutter valve  2-acute resp failure with hypoxia: due to #1 -will continue treatment as mentioned above and will weaned oxygen as tolerated  3-chronic pain syndrome -continue home medication regimen -avoid escalating on any further narcotics or sedatives agents  4-Depression and anxiety -no SI or hallucinations -will continue PRN xanax and continue cymbalta  5-tobacco abuse: -cessation counseling provided  Code Status: Full Family Communication: no family at bedside; husband updated on 9/20 Disposition Plan: home when medically stable. Breathing improving. Had fever overnight. Reevaluate course and if stable home on 9/22   Consultants:  None   Procedures:  See below for x-ray reports   Antibiotics:  Rocephin and zithromax 9/20  HPI/Subjective: Afebrile, no CP, improve breathing and overall feeling somewhat better. Denies nausea and vomiting   Objective: Vitals:   06/06/16 0538 06/06/16 1203  BP: 125/71 103/65  Pulse: 84 74  Resp:  18  Temp: 98.3 F (36.8 C) 98.8 F (37.1 C)    Intake/Output Summary (Last 24 hours) at 06/06/16 1849 Last data filed at 06/06/16 1823  Gross per 24 hour  Intake              660 ml  Output             1700 ml  Net             -1040 ml   Filed Weights   06/04/16 2012 06/05/16 0447 06/06/16 0538  Weight: 86.2 kg (190 lb) 93.7 kg (206 lb 8 oz) 90.5 kg (199 lb 8 oz)    Exam:   General:  Patient is feeling better, but still spiking fever overnight. Denies CP and endorses being able to tolerate diet. Endorses Ha and chronic muscular pain.  Cardiovascular: S1 and S2, no rubs, no gallops,   Respiratory: improve air movement, positive rhonchi, no crackles; minimal wheezing   Abdomen: soft, NT, ND, positive BS  Musculoskeletal: no edema or cyanosis   Data Reviewed: Basic Metabolic Panel:  Recent Labs Lab 06/04/16 2011 06/06/16 0346  NA 137 136  K 3.7 3.5  CL 100* 103  CO2 28 24  GLUCOSE 144* 93  BUN 8 7  CREATININE 0.83 0.80  CALCIUM 9.7 8.5*   Liver Function Tests:  Recent Labs Lab 06/04/16 2011  AST 37  ALT 12*  ALKPHOS 57  BILITOT 1.1  PROT 7.4  ALBUMIN 3.6   CBC:  Recent Labs Lab 06/04/16 2011 06/06/16 0346  WBC 9.6 8.7  NEUTROABS 7.2  --   HGB 12.8 10.5*  HCT 39.0 33.4*  MCV 85.2 85.9  PLT 266 288   BNP (last 3 results)  Recent Labs  06/04/16 2320  BNP 42.9    ProBNP (last 3 results) No results for input(s): PROBNP in the last 8760 hours.  CBG: No results for input(s): GLUCAP  in the last 168 hours.  Recent Results (from the past 240 hour(s))  Culture, blood (Routine x 2)     Status: None (Preliminary result)   Collection Time: 06/04/16  8:28 PM  Result Value Ref Range Status   Specimen Description BLOOD RIGHT ANTECUBITAL  Final   Special Requests BOTTLES DRAWN AEROBIC AND ANAEROBIC 5CC  Final   Culture NO GROWTH 2 DAYS  Final   Report Status PENDING  Incomplete  Culture, blood (Routine x 2)     Status: None (Preliminary result)   Collection Time: 06/04/16  8:40 PM  Result Value Ref Range Status   Specimen Description BLOOD RIGHT HAND  Final   Special Requests IN PEDIATRIC BOTTLE 4CC  Final   Culture NO GROWTH 1 DAY  Final   Report Status PENDING   Incomplete  Urine culture     Status: Abnormal   Collection Time: 06/05/16 12:50 AM  Result Value Ref Range Status   Specimen Description URINE, RANDOM  Final   Special Requests NONE  Final   Culture MULTIPLE SPECIES PRESENT, SUGGEST RECOLLECTION (A)  Final   Report Status 06/06/2016 FINAL  Final     Studies: Dg Chest 2 View  Result Date: 06/04/2016 CLINICAL DATA:  Initial evaluation for code sepsis. EXAM: CHEST  2 VIEW COMPARISON:  Prior radiograph from 12/06/2014. FINDINGS: Cardiac and mediastinal silhouettes are within normal limits. Trach air column midline and patent. Lungs are mildly hypoinflated. There is diffuse vascular congestion with interstitial prominence, which may reflect mild diffuse pulmonary interstitial edema or possibly multi focal infection. No pleural effusion. No pneumothorax. Osseous structures within normal limits. IMPRESSION: Diffuse vascular congestion with interstitial prominence, which may reflect pulmonary interstitial edema and/ or multi focal infection. Electronically Signed   By: Jeannine Boga M.D.   On: 06/04/2016 22:35    Scheduled Meds: . ALPRAZolam  1 mg Oral TID  . azithromycin  500 mg Intravenous Q24H  . budesonide (PULMICORT) nebulizer solution  0.25 mg Nebulization BID  . cefTRIAXone (ROCEPHIN)  IV  1 g Intravenous Q24H  . divalproex  500 mg Oral BID  . DULoxetine  60 mg Oral Daily  . enoxaparin (LOVENOX) injection  40 mg Subcutaneous Daily  . gabapentin  800 mg Oral TID  . guaiFENesin  600 mg Oral BID  . lisinopril  20 mg Oral Daily   And  . hydrochlorothiazide  25 mg Oral Daily  . ipratropium-albuterol  3 mL Nebulization TID  . linaclotide  145 mcg Oral Daily  . tiZANidine  4 mg Oral TID  . topiramate  200 mg Oral QHS   Continuous Infusions:   Principal Problem:   CAP (community acquired pneumonia) Active Problems:   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)   Acute respiratory failure with hypoxia ([place])    Time spent:  30 minutes    Barton Dubois  [facility] Pager [phone]. If 7PM-7AM, please contact night-coverage at [email or web address], password [facility] 06/06/2016, 6:49 PM  LOS: 1 day

## 2016-06-07 ENCOUNTER — Inpatient Hospital Stay (HOSPITAL_COMMUNITY): Payer: Managed Care, Other (non HMO)

## 2016-06-07 DIAGNOSIS — R0602 Shortness of breath: Secondary | ICD-10-CM

## 2016-06-07 DIAGNOSIS — G894 Chronic pain syndrome: Secondary | ICD-10-CM

## 2016-06-07 DIAGNOSIS — R6510 Systemic inflammatory response syndrome (SIRS) of non-infectious origin without acute organ dysfunction: Secondary | ICD-10-CM

## 2016-06-07 LAB — CBC
HCT: 34.8 % — ABNORMAL LOW (ref 36.0–46.0)
Hemoglobin: 11.2 g/dL — ABNORMAL LOW (ref 12.0–15.0)
MCH: 27.1 pg (ref 26.0–34.0)
MCHC: 32.2 g/dL (ref 30.0–36.0)
MCV: 84.1 fL (ref 78.0–100.0)
PLATELETS: 304 10*3/uL (ref 150–400)
RBC: 4.14 MIL/uL (ref 3.87–5.11)
RDW: 13.9 % (ref 11.5–15.5)
WBC: 5.8 10*3/uL (ref 4.0–10.5)

## 2016-06-07 LAB — BASIC METABOLIC PANEL
Anion gap: 11 (ref 5–15)
BUN: 10 mg/dL (ref 6–20)
CHLORIDE: 99 mmol/L — AB (ref 101–111)
CO2: 25 mmol/L (ref 22–32)
Calcium: 9 mg/dL (ref 8.9–10.3)
Creatinine, Ser: 0.64 mg/dL (ref 0.44–1.00)
GFR calc Af Amer: >60 mL/min (ref >60)
GLUCOSE: 93 mg/dL (ref 65–99)
POTASSIUM: 3.7 mmol/L (ref 3.5–5.1)
Sodium: 135 mmol/L (ref 135–145)

## 2016-06-07 MED ORDER — IPRATROPIUM-ALBUTEROL 20-100 MCG/ACT IN AERS: 1.0000 | INHALATION_SPRAY | Freq: Four times a day (QID) | RESPIRATORY_TRACT | 2 refills | Status: DC | PRN

## 2016-06-07 MED ORDER — LEVOFLOXACIN 750 MG PO TABS: 750.0000 mg | ORAL_TABLET | Freq: Every day | ORAL | 0 refills | Status: DC

## 2016-06-07 MED ORDER — GUAIFENESIN ER 600 MG PO TB12: 600.0000 mg | ORAL_TABLET | Freq: Two times a day (BID) | ORAL | 0 refills | Status: DC

## 2016-06-07 MED ORDER — AZITHROMYCIN 500 MG PO TABS: 500.0000 mg | ORAL_TABLET | Freq: Every day | ORAL | Status: DC

## 2016-06-07 NOTE — Progress Notes (Signed)
Pt given d/c information and she stated she doesn't need instructions on her home medications. She left her d/c paperwork in the room and she walked w/ her husband to the car per her request

## 2016-06-07 NOTE — Discharge Summary (Signed)
Physician Discharge Summary  SUMANA OESTERLE [id number] DOB: October 02, 1965 DOA: 06/04/2016  PCP: Ron Parker, MD  Admit date: 06/04/2016 Discharge date: 06/07/2016  Time spent: 35 minutes  Recommendations for Outpatient Follow-up:  1. Repeat BMET to follow electrolytes and renal function  2. Please repeat CXR in 3-4 weeks to assure resolution of infiltrates    Discharge Diagnoses:  Principal Problem:   CAP (community acquired pneumonia) Active Problems:   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)   Acute respiratory failure with hypoxia ([facility])   SIRS (systemic inflammatory response syndrome) ([facility])   SOB (shortness of breath)   Chronic pain syndrome   Discharge Condition: stable and improved. Discharge home with instructions to follow up with PCP in 10 days.  Diet recommendation:   Filed Weights   06/04/16 2012 06/05/16 0447 06/06/16 0538  Weight: 86.2 kg (190 lb) 93.7 kg (206 lb 8 oz) 90.5 kg (199 lb 8 oz)    History of present illness:  50 y.o.femalewith medical history significant of chronic pain, smoking. Patient presents to the ED with c/o 1 week of cough and chest pain. No known sick contacts. Symptoms are persistent and worsening since onset. Nothing makes better or worse.  ED Course:Tm 101.7, CXR is read as either pulmonary edema or multifocal infection. Patient started on CAP treatment.  Hospital Course:  1-SIRS/CAP: CURB 65 2 -improved and stable for discharge -will discharge home on levaquin to complete antibiotic therapy -continue PRN Combivent and continue use of mucinex and flutter valve -will recommend repeat CXR in 3-4 weeks  2-acute resp failure with hypoxia: due to #1 -will continue treatment as mentioned above and will weaned oxygen as tolerated  3-chronic pain syndrome -continue home medication regimen  4-Depression and anxiety -no SI or hallucinations -will continue PRN xanax and continue home Cymbalta regimen   5-tobacco  abuse: -cessation counseling provided  Procedures:  See below for x-ray reports   Consultations:  None   Discharge Exam: Vitals:   06/07/16 0900 06/07/16 1200  BP: 111/67 118/72  Pulse: 83 80  Resp: 20 18  Temp: 97.5 F (36.4 C) 98.3 F (36.8 C)    General:  Patient is feeling much better, afebrile, no nausea, no vomiting. Denies CP and endorses being able to tolerate diet. Endorses no headache and reports chronic muscular pain stable.  Cardiovascular: S1 and S2, no rubs, no gallops,   Respiratory: improve air movement, positive rhonchi, no crackles; minimal wheezing   Abdomen: soft, NT, ND, positive BS  Musculoskeletal: no edema or cyanosis    Discharge Instructions   Discharge Instructions    Diet - low sodium heart healthy    Complete by:  As directed    Discharge instructions    Complete by:  As directed    Stop smoking Take medications as prescribed Maintain adequate hydration  Arrange follow up with PCP in 10 days     Current Discharge Medication List    START taking these medications   Details  guaiFENesin (MUCINEX) 600 MG 12 hr tablet Take 1 tablet (600 mg total) by mouth 2 (two) times daily. Qty: 40 tablet, Refills: 0    Ipratropium-Albuterol (COMBIVENT) 20-100 MCG/ACT AERS respimat Inhale 1 puff into the lungs every 6 (six) hours as needed for wheezing or shortness of breath. Qty: 1 Inhaler, Refills: 2    levofloxacin (LEVAQUIN) 750 MG tablet Take 1 tablet (750 mg total) by mouth daily. Qty: 6 tablet, Refills: 0      CONTINUE these medications  which have NOT CHANGED   Details  ALPRAZolam (XANAX) 1 MG tablet Take 1 mg by mouth 3 (three) times daily.    divalproex (DEPAKOTE) 500 MG DR tablet Take 500 mg by mouth 2 (two) times daily.  Refills: 0    DULoxetine (CYMBALTA) 60 MG capsule Take 1 capsule (60 mg total) by mouth daily. Qty: 30 capsule, Refills: 5    gabapentin (NEURONTIN) 300 MG capsule Take 600 mg by mouth 3 (three) times daily.     LINZESS 145 MCG CAPS capsule Take 145 mcg by mouth daily.     lisinopril-hydrochlorothiazide (PRINZIDE,ZESTORETIC) 20-25 MG per tablet Take 1 tablet by mouth daily.    oxyCODONE (ROXICODONE) 15 MG immediate release tablet Take 15 mg by mouth every 4 (four) hours as needed for pain. As needed Refills: 0    promethazine (PHENERGAN) 25 MG tablet Take 25 mg by mouth every 6 (six) hours as needed for nausea or vomiting.    tiZANidine (ZANAFLEX) 4 MG tablet Take 1 tablet by mouth 3 (three) times daily. Refills: 4    topiramate (TOPAMAX) 100 MG tablet Take 200 mg by mouth at bedtime.     traMADol (ULTRAM) 50 MG tablet Take 50 mg by mouth 2 (two) times daily as needed for moderate pain.     zolpidem (AMBIEN) 10 MG tablet Take 10 mg by mouth at bedtime as needed for sleep.       STOP taking these medications     albuterol (PROVENTIL HFA;VENTOLIN HFA) 108 (90 BASE) MCG/ACT inhaler      gabapentin (NEURONTIN) 800 MG tablet        Allergies  Allergen Reactions  . Other Other (See Comments)    IV meds for migraine, patient stated it made her feel like something was crawling all over her body.   Follow-up Information    MARTIN,TANYA D, MD. Schedule an appointment as soon as possible for a visit in 10 day(s).   Specialty:  Family Medicine Contact information: [id number] [place] [address] [phone]           The results of significant diagnostics from this hospitalization (including imaging, microbiology, ancillary and laboratory) are listed below for reference.    Significant Diagnostic Studies: Dg Chest 2 View  Result Date: 06/07/2016 CLINICAL DATA:  Shortness of breath, fever, cough EXAM: CHEST  2 VIEW COMPARISON:  06/04/2016 FINDINGS: Heart is normal size. Diffuse interstitial prominence within the lungs, similar to prior study. No effusions or pneumothorax. No acute bony abnormality. IMPRESSION: Continued diffuse interstitial prominence  throughout the lungs. Given the normal heart size, this could reflect interstitial pneumonia. No real change. Electronically Signed   By: Rolm Baptise M.D.   On: 06/07/2016 08:55   Dg Chest 2 View  Result Date: 06/04/2016 CLINICAL DATA:  Initial evaluation for code sepsis. EXAM: CHEST  2 VIEW COMPARISON:  Prior radiograph from 12/06/2014. FINDINGS: Cardiac and mediastinal silhouettes are within normal limits. Trach air column midline and patent. Lungs are mildly hypoinflated. There is diffuse vascular congestion with interstitial prominence, which may reflect mild diffuse pulmonary interstitial edema or possibly multi focal infection. No pleural effusion. No pneumothorax. Osseous structures within normal limits. IMPRESSION: Diffuse vascular congestion with interstitial prominence, which may reflect pulmonary interstitial edema and/ or multi focal infection. Electronically Signed   By: Jeannine Boga M.D.   On: 06/04/2016 22:35    Microbiology: Recent Results (from the past 240 hour(s))  Culture, blood (Routine x 2)  Status: None (Preliminary result)   Collection Time: 06/04/16  8:28 PM  Result Value Ref Range Status   Specimen Description BLOOD RIGHT ANTECUBITAL  Final   Special Requests BOTTLES DRAWN AEROBIC AND ANAEROBIC 5CC  Final   Culture NO GROWTH 2 DAYS  Final   Report Status PENDING  Incomplete  Culture, blood (Routine x 2)     Status: None (Preliminary result)   Collection Time: 06/04/16  8:40 PM  Result Value Ref Range Status   Specimen Description BLOOD RIGHT HAND  Final   Special Requests IN PEDIATRIC BOTTLE 4CC  Final   Culture NO GROWTH 1 DAY  Final   Report Status PENDING  Incomplete  Urine culture     Status: Abnormal   Collection Time: 06/05/16 12:50 AM  Result Value Ref Range Status   Specimen Description URINE, RANDOM  Final   Special Requests NONE  Final   Culture MULTIPLE SPECIES PRESENT, SUGGEST RECOLLECTION (A)  Final   Report Status 06/06/2016 FINAL   Final     Labs: Basic Metabolic Panel:  Recent Labs Lab 06/04/16 2011 06/06/16 0346 06/07/16 0405  NA 137 136 135  K 3.7 3.5 3.7  CL 100* 103 99*  CO2 28 24 25   GLUCOSE 144* 93 93  BUN 8 7 10   CREATININE 0.83 0.80 0.64  CALCIUM 9.7 8.5* 9.0   Liver Function Tests:  Recent Labs Lab 06/04/16 2011  AST 37  ALT 12*  ALKPHOS 57  BILITOT 1.1  PROT 7.4  ALBUMIN 3.6   CBC:  Recent Labs Lab 06/04/16 2011 06/06/16 0346 06/07/16 0405  WBC 9.6 8.7 5.8  NEUTROABS 7.2  --   --   HGB 12.8 10.5* 11.2*  HCT 39.0 33.4* 34.8*  MCV 85.2 85.9 84.1  PLT 266 288 304   BNP (last 3 results)  Recent Labs  06/04/16 2320  BNP 42.9   Signed:  Barton Dubois MD.  [facility] 06/07/2016, 3:01 PM

## 2016-06-09 LAB — CULTURE, BLOOD (ROUTINE X 2): CULTURE: NO GROWTH

## 2016-06-10 LAB — CULTURE, BLOOD (ROUTINE X 2): Culture: NO GROWTH

## 2017-04-16 ENCOUNTER — Other Ambulatory Visit: Payer: Self-pay | Admitting: Family Medicine

## 2017-04-16 DIAGNOSIS — Z0000 Encounter for general adult medical examination without abnormal findings: Secondary | ICD-10-CM

## 2017-04-18 ENCOUNTER — Ambulatory Visit
Admission: RE | Admit: 2017-04-18 | Discharge: 2017-04-18 | Disposition: A | Discharge status: Home / Self Care | Payer: Medicaid Other | Source: Ambulatory Visit | Attending: Family Medicine | Admitting: Family Medicine

## 2017-04-18 DIAGNOSIS — Z0000 Encounter for general adult medical examination without abnormal findings: Secondary | ICD-10-CM

## 2018-03-05 ENCOUNTER — Emergency Department (HOSPITAL_COMMUNITY): Payer: Medicare Other

## 2018-03-05 ENCOUNTER — Other Ambulatory Visit: Payer: Self-pay

## 2018-03-05 ENCOUNTER — Encounter (HOSPITAL_COMMUNITY): Payer: Self-pay | Admitting: Emergency Medicine

## 2018-03-05 ENCOUNTER — Inpatient Hospital Stay (HOSPITAL_COMMUNITY)
Admission: EM | Admit: 2018-03-05 | Discharge: 2018-03-06 | DRG: 189 | Disposition: A | Discharge status: Home / Self Care | Payer: Medicare Other | Attending: Family Medicine | Admitting: Family Medicine

## 2018-03-05 DIAGNOSIS — G894 Chronic pain syndrome: Secondary | ICD-10-CM | POA: Diagnosis present

## 2018-03-05 DIAGNOSIS — F329 Major depressive disorder, single episode, unspecified: Secondary | ICD-10-CM | POA: Diagnosis present

## 2018-03-05 DIAGNOSIS — J45909 Unspecified asthma, uncomplicated: Secondary | ICD-10-CM | POA: Diagnosis present

## 2018-03-05 DIAGNOSIS — G5642 Causalgia of left upper limb: Secondary | ICD-10-CM | POA: Diagnosis present

## 2018-03-05 DIAGNOSIS — Z79899 Other long term (current) drug therapy: Secondary | ICD-10-CM

## 2018-03-05 DIAGNOSIS — E876 Hypokalemia: Secondary | ICD-10-CM

## 2018-03-05 DIAGNOSIS — Z7984 Long term (current) use of oral hypoglycemic drugs: Secondary | ICD-10-CM

## 2018-03-05 DIAGNOSIS — K589 Irritable bowel syndrome without diarrhea: Secondary | ICD-10-CM | POA: Diagnosis present

## 2018-03-05 DIAGNOSIS — F32A Depression, unspecified: Secondary | ICD-10-CM

## 2018-03-05 DIAGNOSIS — Z889 Allergy status to unspecified drugs, medicaments and biological substances status: Secondary | ICD-10-CM | POA: Diagnosis not present

## 2018-03-05 DIAGNOSIS — I10 Essential (primary) hypertension: Secondary | ICD-10-CM | POA: Diagnosis present

## 2018-03-05 DIAGNOSIS — F419 Anxiety disorder, unspecified: Secondary | ICD-10-CM | POA: Diagnosis present

## 2018-03-05 DIAGNOSIS — Z791 Long term (current) use of non-steroidal anti-inflammatories (NSAID): Secondary | ICD-10-CM | POA: Diagnosis not present

## 2018-03-05 DIAGNOSIS — F17210 Nicotine dependence, cigarettes, uncomplicated: Secondary | ICD-10-CM | POA: Diagnosis present

## 2018-03-05 DIAGNOSIS — K219 Gastro-esophageal reflux disease without esophagitis: Secondary | ICD-10-CM | POA: Diagnosis present

## 2018-03-05 DIAGNOSIS — R0602 Shortness of breath: Secondary | ICD-10-CM | POA: Diagnosis present

## 2018-03-05 DIAGNOSIS — J9601 Acute respiratory failure with hypoxia: Secondary | ICD-10-CM | POA: Diagnosis present

## 2018-03-05 DIAGNOSIS — G5640 Causalgia of unspecified upper limb: Secondary | ICD-10-CM | POA: Diagnosis present

## 2018-03-05 DIAGNOSIS — G43909 Migraine, unspecified, not intractable, without status migrainosus: Secondary | ICD-10-CM | POA: Diagnosis present

## 2018-03-05 LAB — BASIC METABOLIC PANEL
Anion gap: 12 (ref 5–15)
BUN: 8 mg/dL (ref 6–20)
CHLORIDE: 103 mmol/L (ref 101–111)
CO2: 26 mmol/L (ref 22–32)
Calcium: 9.4 mg/dL (ref 8.9–10.3)
Creatinine, Ser: 0.74 mg/dL (ref 0.44–1.00)
GFR calc non Af Amer: >60 mL/min (ref >60)
Glucose, Bld: 105 mg/dL — ABNORMAL HIGH (ref 65–99)
Potassium: 3.1 mmol/L — ABNORMAL LOW (ref 3.5–5.1)
SODIUM: 141 mmol/L (ref 135–145)

## 2018-03-05 LAB — CBC WITH DIFFERENTIAL/PLATELET
Basophils Absolute: 0 10*3/uL (ref 0.0–0.1)
Basophils Relative: 1 %
EOS ABS: 0.1 10*3/uL (ref 0.0–0.7)
Eosinophils Relative: 1 %
HCT: 36.5 % (ref 36.0–46.0)
HEMOGLOBIN: 12 g/dL (ref 12.0–15.0)
LYMPHS ABS: 1.6 10*3/uL (ref 0.7–4.0)
LYMPHS PCT: 20 %
MCH: 27.8 pg (ref 26.0–34.0)
MCHC: 32.9 g/dL (ref 30.0–36.0)
MCV: 84.5 fL (ref 78.0–100.0)
Monocytes Absolute: 0.3 10*3/uL (ref 0.1–1.0)
Monocytes Relative: 3 %
NEUTROS ABS: 6.1 10*3/uL (ref 1.7–7.7)
NEUTROS PCT: 75 %
Platelets: 264 10*3/uL (ref 150–400)
RBC: 4.32 MIL/uL (ref 3.87–5.11)
RDW: 14.1 % (ref 11.5–15.5)
WBC: 8 10*3/uL (ref 4.0–10.5)

## 2018-03-05 LAB — BRAIN NATRIURETIC PEPTIDE: B Natriuretic Peptide: 31.9 pg/mL (ref 0.0–100.0)

## 2018-03-05 LAB — I-STAT BETA HCG BLOOD, ED (MC, WL, AP ONLY)

## 2018-03-05 LAB — I-STAT TROPONIN, ED: Troponin i, poc: 0 ng/mL (ref 0.00–0.08)

## 2018-03-05 LAB — D-DIMER, QUANTITATIVE (NOT AT ARMC): D DIMER QUANT: 0.58 ug{FEU}/mL — AB (ref 0.00–0.50)

## 2018-03-05 MED ORDER — ACETAMINOPHEN 325 MG PO TABS
650.0000 mg | ORAL_TABLET | Freq: Four times a day (QID) | ORAL | Status: DC | PRN
  Administered 2018-03-06: 650 mg via ORAL
  Filled 2018-03-05: qty 2

## 2018-03-05 MED ORDER — SODIUM CHLORIDE 0.9 % IV SOLN
500.0000 mg | Freq: Once | INTRAVENOUS | Status: DC
  Filled 2018-03-05: qty 500

## 2018-03-05 MED ORDER — LISINOPRIL-HYDROCHLOROTHIAZIDE 20-25 MG PO TABS: 1.0000 | ORAL_TABLET | Freq: Every day | ORAL | Status: DC

## 2018-03-05 MED ORDER — OXYCODONE HCL 5 MG PO TABS
15.0000 mg | ORAL_TABLET | Freq: Once | ORAL | Status: AC
  Administered 2018-03-05: 15 mg via ORAL
  Filled 2018-03-05: qty 3

## 2018-03-05 MED ORDER — POLYETHYLENE GLYCOL 3350 17 G PO PACK: 17.0000 g | PACK | Freq: Every day | ORAL | Status: DC | PRN

## 2018-03-05 MED ORDER — SODIUM CHLORIDE 0.9 % IV SOLN
1.0000 g | Freq: Once | INTRAVENOUS | Status: AC
  Administered 2018-03-05: 1 g via INTRAVENOUS
  Filled 2018-03-05: qty 10

## 2018-03-05 MED ORDER — HYDROCHLOROTHIAZIDE 25 MG PO TABS
25.0000 mg | ORAL_TABLET | Freq: Every day | ORAL | Status: DC
  Administered 2018-03-06: 25 mg via ORAL
  Filled 2018-03-05: qty 1

## 2018-03-05 MED ORDER — POTASSIUM CHLORIDE CRYS ER 20 MEQ PO TBCR
40.0000 meq | EXTENDED_RELEASE_TABLET | Freq: Once | ORAL | Status: AC
  Administered 2018-03-05: 40 meq via ORAL
  Filled 2018-03-05: qty 2

## 2018-03-05 MED ORDER — METHYLPREDNISOLONE SODIUM SUCC 125 MG IJ SOLR
125.0000 mg | Freq: Once | INTRAMUSCULAR | Status: DC
  Filled 2018-03-05: qty 2

## 2018-03-05 MED ORDER — IPRATROPIUM-ALBUTEROL 0.5-2.5 (3) MG/3ML IN SOLN
3.0000 mL | Freq: Once | RESPIRATORY_TRACT | Status: AC
  Administered 2018-03-05: 3 mL via RESPIRATORY_TRACT
  Filled 2018-03-05: qty 3

## 2018-03-05 MED ORDER — IOPAMIDOL (ISOVUE-370) INJECTION 76%
100.0000 mL | Freq: Once | INTRAVENOUS | Status: AC | PRN
  Administered 2018-03-05: 65 mL via INTRAVENOUS

## 2018-03-05 MED ORDER — ORAL CARE MOUTH RINSE
15.0000 mL | Freq: Two times a day (BID) | OROMUCOSAL | Status: DC
  Administered 2018-03-05: 15 mL via OROMUCOSAL

## 2018-03-05 MED ORDER — SODIUM CHLORIDE 0.9 % IV BOLUS
500.0000 mL | Freq: Once | INTRAVENOUS | Status: AC
  Administered 2018-03-05: 500 mL via INTRAVENOUS

## 2018-03-05 MED ORDER — ALPRAZOLAM 0.5 MG PO TABS
0.5000 mg | ORAL_TABLET | Freq: Once | ORAL | Status: AC
  Administered 2018-03-05: 0.5 mg via ORAL
  Filled 2018-03-05: qty 1

## 2018-03-05 MED ORDER — DULOXETINE HCL 30 MG PO CPEP
60.0000 mg | ORAL_CAPSULE | Freq: Two times a day (BID) | ORAL | Status: DC
  Administered 2018-03-05 – 2018-03-06 (×2): 60 mg via ORAL
  Filled 2018-03-05 (×2): qty 2

## 2018-03-05 MED ORDER — IOPAMIDOL (ISOVUE-370) INJECTION 76%
INTRAVENOUS | Status: AC
  Filled 2018-03-05: qty 100

## 2018-03-05 MED ORDER — GABAPENTIN 300 MG PO CAPS
600.0000 mg | ORAL_CAPSULE | Freq: Four times a day (QID) | ORAL | Status: DC
  Administered 2018-03-05 – 2018-03-06 (×3): 600 mg via ORAL
  Filled 2018-03-05 (×3): qty 2

## 2018-03-05 MED ORDER — ENOXAPARIN SODIUM 40 MG/0.4ML ~~LOC~~ SOLN
40.0000 mg | SUBCUTANEOUS | Status: DC
Start: 2018-03-05 — End: 2018-03-06
  Administered 2018-03-05: 40 mg via SUBCUTANEOUS
  Filled 2018-03-05: qty 0.4

## 2018-03-05 MED ORDER — IPRATROPIUM-ALBUTEROL 0.5-2.5 (3) MG/3ML IN SOLN
3.0000 mL | Freq: Four times a day (QID) | RESPIRATORY_TRACT | Status: DC
  Administered 2018-03-05: 3 mL via RESPIRATORY_TRACT
  Filled 2018-03-05: qty 3

## 2018-03-05 MED ORDER — LISINOPRIL 20 MG PO TABS
20.0000 mg | ORAL_TABLET | Freq: Every day | ORAL | Status: DC
  Administered 2018-03-06: 20 mg via ORAL
  Filled 2018-03-05: qty 1

## 2018-03-05 MED ORDER — ALBUTEROL SULFATE (2.5 MG/3ML) 0.083% IN NEBU
2.5000 mg | INHALATION_SOLUTION | RESPIRATORY_TRACT | Status: DC | PRN
  Administered 2018-03-06: 2.5 mg via RESPIRATORY_TRACT
  Filled 2018-03-05: qty 3

## 2018-03-05 MED ORDER — METHYLPREDNISOLONE SODIUM SUCC 125 MG IJ SOLR
80.0000 mg | Freq: Four times a day (QID) | INTRAMUSCULAR | Status: DC
  Administered 2018-03-05 – 2018-03-06 (×3): 80 mg via INTRAVENOUS
  Filled 2018-03-05 (×3): qty 2

## 2018-03-05 MED ORDER — METHYLPREDNISOLONE SODIUM SUCC 125 MG IJ SOLR
125.0000 mg | Freq: Once | INTRAMUSCULAR | Status: AC
  Administered 2018-03-05: 125 mg via INTRAVENOUS

## 2018-03-05 MED ORDER — ZOLPIDEM TARTRATE 5 MG PO TABS
5.0000 mg | ORAL_TABLET | Freq: Every day | ORAL | Status: DC
  Administered 2018-03-05: 5 mg via ORAL
  Filled 2018-03-05: qty 1

## 2018-03-05 MED ORDER — ALBUTEROL SULFATE (2.5 MG/3ML) 0.083% IN NEBU
5.0000 mg | INHALATION_SOLUTION | Freq: Once | RESPIRATORY_TRACT | Status: DC
  Filled 2018-03-05: qty 6

## 2018-03-05 MED ORDER — PROMETHAZINE HCL 25 MG PO TABS
25.0000 mg | ORAL_TABLET | Freq: Four times a day (QID) | ORAL | Status: DC | PRN
  Administered 2018-03-05 – 2018-03-06 (×2): 25 mg via ORAL
  Filled 2018-03-05 (×2): qty 1

## 2018-03-05 MED ORDER — ONDANSETRON HCL 4 MG PO TABS: 4.0000 mg | ORAL_TABLET | Freq: Four times a day (QID) | ORAL | Status: DC | PRN

## 2018-03-05 MED ORDER — SODIUM CHLORIDE 0.9 % IV SOLN
1.0000 g | INTRAVENOUS | Status: DC
  Filled 2018-03-05: qty 10

## 2018-03-05 MED ORDER — DIVALPROEX SODIUM 500 MG PO DR TAB
500.0000 mg | DELAYED_RELEASE_TABLET | Freq: Two times a day (BID) | ORAL | Status: DC
  Administered 2018-03-05 – 2018-03-06 (×2): 500 mg via ORAL
  Filled 2018-03-05: qty 1
  Filled 2018-03-05 (×2): qty 2
  Filled 2018-03-05: qty 1

## 2018-03-05 MED ORDER — ALPRAZOLAM 0.5 MG PO TABS
0.5000 mg | ORAL_TABLET | Freq: Three times a day (TID) | ORAL | Status: DC | PRN
  Administered 2018-03-05: 0.5 mg via ORAL
  Filled 2018-03-05: qty 1

## 2018-03-05 MED ORDER — IPRATROPIUM-ALBUTEROL 0.5-2.5 (3) MG/3ML IN SOLN
3.0000 mL | Freq: Three times a day (TID) | RESPIRATORY_TRACT | Status: DC
  Administered 2018-03-06: 3 mL via RESPIRATORY_TRACT

## 2018-03-05 MED ORDER — ONDANSETRON HCL 4 MG/2ML IJ SOLN: 4.0000 mg | Freq: Four times a day (QID) | INTRAMUSCULAR | Status: DC | PRN

## 2018-03-05 MED ORDER — TOPIRAMATE 100 MG PO TABS
100.0000 mg | ORAL_TABLET | Freq: Every day | ORAL | Status: DC | PRN
  Administered 2018-03-06: 100 mg via ORAL
  Filled 2018-03-05: qty 1

## 2018-03-05 MED ORDER — ACETAMINOPHEN 650 MG RE SUPP
650.0000 mg | Freq: Four times a day (QID) | RECTAL | Status: DC | PRN
Start: 2018-03-05 — End: 2018-03-06

## 2018-03-05 MED ORDER — LINACLOTIDE 145 MCG PO CAPS
145.0000 ug | ORAL_CAPSULE | Freq: Every day | ORAL | Status: DC | PRN
  Filled 2018-03-05: qty 1

## 2018-03-05 MED ORDER — OXYCODONE HCL 5 MG PO TABS
5.0000 mg | ORAL_TABLET | ORAL | Status: DC | PRN
  Administered 2018-03-05 – 2018-03-06 (×4): 5 mg via ORAL
  Filled 2018-03-05 (×5): qty 1

## 2018-03-05 MED ORDER — SODIUM CHLORIDE 0.9 % IV SOLN
500.0000 mg | INTRAVENOUS | Status: DC
  Administered 2018-03-05: 500 mg via INTRAVENOUS
  Filled 2018-03-05: qty 500

## 2018-03-05 MED ORDER — OXYCODONE HCL 5 MG PO TABS
15.0000 mg | ORAL_TABLET | Freq: Four times a day (QID) | ORAL | Status: DC
  Administered 2018-03-05 – 2018-03-06 (×3): 15 mg via ORAL
  Filled 2018-03-05 (×3): qty 3

## 2018-03-05 NOTE — ED Notes (Signed)
Pt has been refusing to wear oxygen. Just before pt went for CT, pt unhooked herself from monitor, walked out of the room and started she felt much better and thought she could go. Writer asked pt if she had her CT yet and explained that it  was necessary, Probation officer also asked to place pt back on monitor to check vs and o2 sat. Pt sat was 87% on RA. Pt placed back on [place], pt stated "no I don't need that, I feel fine". This nurse and CT tech explained that o2 was necessary at this time and during CT procedure.

## 2018-03-05 NOTE — ED Provider Notes (Addendum)
[facility] DEPT Provider Note   CSN: [id number] Arrival date & time: 03/05/18  1027     History   Chief Complaint Chief Complaint  Patient presents with  . Asthma    HPI Joan [name] is a 52 y.o. female.  HPI   Pt is a 52 y/o female with a h/o asthma who presents to the ED to be evaluated for shortness of breath and chest pain that began 2 days ago. Reports she has "crushing" chest pain to the middle of her chest that is constant. Denies radiation of pain to back, arms, or jaw. Denies a cough. States sxs feel like she is having an asthma attack, however she does not frequently have asthma attacks and does not feel like she has been wheezing.  Takes two inhalers at home and recently ran out of her controller inhaler. Tried taking albuterol with no relief. She reports sweats, chills, but denies documented fever at home. Denies rhinorrhea, congestion, sore throat or cough.  Pt notes that she started taking a new medication called Belbuca 2 days ago prior to the onset of there sxs.   Pt states that she received two neb tx's in triage and one via EMS with no relief.   Denies leg pain/swelling, hemoptysis, recent surgery/trauma, recent long travel, hormone use, personal hx of cancer, or hx of DVT/PE.   Past Medical History:  Diagnosis Date  . Anemia   . Anxiety   . Asthma   . Chronic back pain   . Chronic neck pain   . Complex regional pain syndrome   . GERD (gastroesophageal reflux disease)   . Hematuria 09/07/2013  . Hypertension   . IBS (irritable bowel syndrome)   . Migraine   . Paresthesia of left arm     Patient Active Problem List   Diagnosis Date Noted  . Acute hypoxemic respiratory failure ([place]) 03/05/2018  . SIRS (systemic inflammatory response syndrome) ([facility])   . SOB (shortness of breath)   . Chronic pain syndrome   . CAP (community acquired pneumonia) 06/05/2016  . Acute respiratory failure with hypoxia ([place]) 06/05/2016  .  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) 06/27/2014  . Spinal stenosis in cervical region 06/27/2014  . Back pain 09/07/2013  . Hematuria 09/07/2013  . Endometrium, polyp 08/17/2013  . Anemia 08/13/2013    Past Surgical History:  Procedure Laterality Date  . DILITATION & CURRETTAGE/HYSTROSCOPY WITH THERMACHOICE ABLATION N/A 08/17/2013   Procedure: DILATATION & CURETTAGE/HYSTEROSCOPY WITH THERMACHOICE ABLATION;  Surgeon: Jonnie Kind, MD;  Location: [facility];  Service: Gynecology;  Laterality: N/A;  18 ml D5W in & out; total therapy time= 9 minutes 33 seconds; temp= 87 degrees celcius  . HERNIA REPAIR     ventral hernia  . POLYPECTOMY N/A 08/17/2013   Procedure: REMOVAL ENDOMETRIAL POLYP;  Surgeon: Jonnie Kind, MD;  Location: [facility];  Service: Gynecology;  Laterality: N/A;  . TUBAL LIGATION    . ULNAR NERVE REPAIR Left    [facility] 2013     OB History    Gravida  4   Para  4   Term  4   Preterm      AB      Living  4     SAB      TAB      Ectopic      Multiple      Live Births  Home Medications    Prior to Admission medications   Medication Sig Start Date End Date Taking? Authorizing Provider  ALPRAZolam Duanne Moron) 0.5 MG tablet Take 0.5 mg by mouth 3 (three) times daily as needed for anxiety. 02/10/18  Yes [provider]  Cholecalciferol (VITAMIN D PO) Take 2 tablets by mouth daily.   Yes [provider]  divalproex (DEPAKOTE) 500 MG DR tablet Take 500 mg by mouth 2 (two) times daily.  04/15/16  Yes [provider]  DULoxetine (CYMBALTA) 60 MG capsule Take 1 capsule (60 mg total) by mouth daily. Patient taking differently: Take 60 mg by mouth 2 (two) times daily.  07/29/14  Yes Kirsteins, Luanna Salk, MD  gabapentin (NEURONTIN) 300 MG capsule Take 600 mg by mouth 4 (four) times daily.  05/14/16  Yes [provider]  ibuprofen (ADVIL,MOTRIN) 200 MG tablet Take 400-600 mg by mouth every 6 (six) hours as needed for  mild pain.   Yes [provider]  Ipratropium-Albuterol (COMBIVENT RESPIMAT) 20-100 MCG/ACT AERS respimat Inhale 1 puff into the lungs every 6 (six) hours as needed for wheezing or shortness of breath.   Yes [provider]  LINZESS 145 MCG CAPS capsule Take 145 mcg by mouth daily as needed (IBS constipation).  05/10/14  Yes [provider]  lisinopril-hydrochlorothiazide (PRINZIDE,ZESTORETIC) 20-25 MG per tablet Take 1 tablet by mouth daily. 05/10/14  Yes [provider]  naloxone (NARCAN) nasal spray 4 mg/0.1 mL Place 1 spray into the nose daily as needed (overdose).   Yes [provider]  naproxen (NAPROSYN) 500 MG tablet Take 500 mg by mouth 2 (two) times daily with a meal. 02/28/18  Yes [provider]  oxycodone (OXY-IR) 5 MG capsule Take 5 mg by mouth every 4 (four) hours as needed for pain.   Yes [provider]  oxyCODONE (ROXICODONE) 15 MG immediate release tablet Take 15 mg by mouth 4 (four) times daily.   Yes [provider]  promethazine (PHENERGAN) 25 MG tablet Take 25 mg by mouth every 6 (six) hours as needed for nausea or vomiting.   Yes [provider]  topiramate (TOPAMAX) 100 MG tablet Take 100 mg by mouth daily as needed (migraine).  06/06/14  Yes [provider]  VENTOLIN HFA 108 (90 Base) MCG/ACT inhaler Inhale 1 puff into the lungs every 4 (four) hours as needed for shortness of breath or wheezing. 01/11/18  Yes [provider]  zolpidem (AMBIEN) 10 MG tablet Take 10 mg by mouth at bedtime.    Yes [provider]    Family History Family History  Problem Relation Age of Onset  . Diabetes Sister   . Hypertension Sister   . Heart disease Sister   . Cancer Sister        brain tumor  . Heart disease Brother   . Diabetes Brother   . Cancer Maternal Aunt        uterine  . Cancer Maternal Grandmother        uterine  . Other Mother        car accident    Social  History Social History   Tobacco Use  . Smoking status: Current Every Day Smoker    Packs/day: 1.00    Years: 26.00    Pack years: 26.00    Types: Cigarettes  . Smokeless tobacco: Never Used  . Tobacco comment: 04/23/16 1/2- 3/4 PPD  Substance Use Topics  . Alcohol use: No  . Drug use:  No     Allergies   Other   Review of Systems Review of Systems  Constitutional: Negative for chills and fever.  HENT: Negative for congestion, ear pain, rhinorrhea and sore throat.   Eyes: Negative for visual disturbance.  Respiratory: Positive for shortness of breath. Negative for cough and wheezing.   Cardiovascular: Positive for chest pain. Negative for palpitations and leg swelling.  Gastrointestinal: Negative for abdominal pain, constipation, diarrhea, nausea and vomiting.  Genitourinary: Negative for dysuria, flank pain and hematuria.  Musculoskeletal: Negative for back pain.  Skin: Negative for rash.  Neurological: Negative for headaches.  All other systems reviewed and are negative.  Physical Exam Updated Vital Signs BP 136/89   Pulse 81   Temp 98.9 F (37.2 C) (Oral)   Resp (!) 21   Ht 5\' 6"  (1.676 m)   Wt 90.3 kg (199 lb)   SpO2 (!) 88%   BMI 32.12 kg/m   Physical Exam  Constitutional: She appears well-developed and well-nourished. No distress.  HENT:  Head: Normocephalic and atraumatic.  Mouth/Throat: Oropharynx is clear and moist.  Eyes: Conjunctivae are normal.  Neck: Neck supple.  Cardiovascular: Normal rate, regular rhythm, normal heart sounds and intact distal pulses.  No murmur heard. Pulmonary/Chest: Effort normal and breath sounds normal.  Decreased breath sounds bilaterally.  Crackles auscultated bilaterally.  Patient also has expiratory wheezing, more so on the right.  Tachypneic.  Speaking in short sentences.  Patient on 2 L nasal cannula satting at 93% on room air.  Abdominal: Soft. Bowel sounds are normal. She exhibits no distension. There is no  tenderness. There is no guarding.  Musculoskeletal: She exhibits no edema.  No calf TTP, erythema, swelling.  Neurological: She is alert.  Skin: Skin is warm and dry. Capillary refill takes less than 2 seconds.  Psychiatric: She has a normal mood and affect.  Nursing note and vitals reviewed.  ED Treatments / Results  Labs (all labs ordered are listed, but only abnormal results are displayed) Labs Reviewed  BASIC METABOLIC PANEL - Abnormal; Notable for the following components:      Result Value   Potassium 3.1 (*)    Glucose, Bld 105 (*)    All other components within normal limits  D-DIMER, QUANTITATIVE (NOT AT [facility]) - Abnormal; Notable for the following components:   D-Dimer, Quant 0.58 (*)    All other components within normal limits  CBC WITH DIFFERENTIAL/PLATELET  BRAIN NATRIURETIC PEPTIDE  I-STAT BETA HCG BLOOD, ED ([facility], [facility], [facility] ONLY)  I-STAT TROPONIN, ED    EKG EKG Interpretation  Date/Time:  Thursday March 05 2018 11:50:58 EDT Ventricular Rate:  83 PR Interval:    QRS Duration: 97 QT Interval:  382 QTC Calculation: 449 R Axis:   62 Text Interpretation:  Sinus rhythm Low voltage, precordial leads Borderline T abnormalities, anterior leads ST/T changes less prominent when compared to Sept 2017 Confirmed by Sherwood Gambler [phone]) on 03/05/2018 2:49:47 PM   Radiology Dg Chest 2 View  Result Date: 03/05/2018 CLINICAL DATA:  Wheezing and shortness of breath EXAM: CHEST - 2 VIEW COMPARISON:  June 07, 2016 FINDINGS: There is airspace consolidation throughout both mid and lower lung zones. Heart is mildly enlarged with pulmonary venous hypertension. No appreciable pleural effusions. No adenopathy appreciable. No evident bone lesions. IMPRESSION: Airspace opacity in both mid and lower lung zones, likely multifocal pneumonia. Differential considerations include atypical presentation of congestive heart failure and allergic type phenomenon. Pulmonary hemorrhage is a less  likely differential  consideration. There is underlying pulmonary vascular congestion. No adenopathy evident. Electronically Signed   By: Lowella Grip III M.D.   On: 03/05/2018 13:01   Ct Angio Chest Pe W And/or Wo Contrast  Result Date: 03/05/2018 CLINICAL DATA:  52 year old female with shortness of breath, wheezing. Positive D-dimer. EXAM: CT ANGIOGRAPHY CHEST WITH CONTRAST TECHNIQUE: Multidetector CT imaging of the chest was performed using the standard protocol during bolus administration of intravenous contrast. Multiplanar CT image reconstructions and MIPs were obtained to evaluate the vascular anatomy. CONTRAST:  9mL ISOVUE-370 IOPAMIDOL (ISOVUE-370) INJECTION 76% COMPARISON:  Chest radiographs 1240 hours today. CT Abdomen and Pelvis 03/21/2015. FINDINGS: Cardiovascular: Adequate contrast bolus timing in the pulmonary arterial tree. Respiratory motion about the hila and superior lower lobes. No focal filling defect identified in the pulmonary arteries to suggest acute pulmonary embolism. Cardiac size is at the upper limits of normal. No pericardial effusion. Negative visible aorta. Mediastinum/Nodes: Upper limits of normal mediastinal and bilateral hilar lymph nodes. Lungs/Pleura: The major airways are patent, but there is widespread and nearly diffuse bilateral peribronchial and scattered/peripheral pulmonary ground-glass opacity. All lobes are affected. The left lower lobe basilar segments are relatively spared. No superimposed septal thickening, pleural effusion, consolidation. Upper Abdomen: Negative visible liver, spleen, pancreas, adrenal glands, left kidney, and bowel in the upper abdomen. Musculoskeletal: Negative. Review of the MIP images confirms the above findings. IMPRESSION: 1.  Negative for acute pulmonary embolus. 2. Diffuse bilateral abnormal pulmonary ground-glass opacity is nonspecific. Top differential considerations include respiratory infection such as mycoplasma (PCP if  immunocompromised), allergic or hypersensitivity pneumonitis, pulmonary vasculitis, less likely other chronic lung disease such as sarcoidosis. 3. Borderline to mild mediastinal and hilar lymphadenopathy, likely reactive. Electronically Signed   By: Genevie Ann M.D.   On: 03/05/2018 15:38    Procedures Procedures (including critical care time)  Medications Ordered in ED Medications  albuterol (PROVENTIL) (2.5 MG/3ML) 0.083% nebulizer solution 5 mg (5 mg Nebulization Not Given 03/05/18 1103)  iopamidol (ISOVUE-370) 76 % injection (has no administration in time range)  ALPRAZolam (XANAX) tablet 0.5 mg (has no administration in time range)  cefTRIAXone (ROCEPHIN) 1 g in sodium chloride 0.9 % 100 mL IVPB (has no administration in time range)  azithromycin (ZITHROMAX) 500 mg in sodium chloride 0.9 % 250 mL IVPB (has no administration in time range)  sodium chloride 0.9 % bolus 500 mL (0 mLs Intravenous Stopped 03/05/18 1346)  ipratropium-albuterol (DUONEB) 0.5-2.5 (3) MG/3ML nebulizer solution 3 mL (3 mLs Nebulization Given 03/05/18 1204)  methylPREDNISolone sodium succinate (SOLU-MEDROL) 125 mg/2 mL injection 125 mg (125 mg Intravenous Given 03/05/18 1203)  potassium chloride SA (K-DUR,KLOR-CON) CR tablet 40 mEq (40 mEq Oral Given 03/05/18 1342)  ipratropium-albuterol (DUONEB) 0.5-2.5 (3) MG/3ML nebulizer solution 3 mL (3 mLs Nebulization Given 03/05/18 1326)  oxyCODONE (Oxy IR/ROXICODONE) immediate release tablet 15 mg (15 mg Oral Given 03/05/18 1342)  iopamidol (ISOVUE-370) 76 % injection 100 mL (65 mLs Intravenous Contrast Given 03/05/18 1515)     Initial Impression / Assessment and Plan / ED Course  I have reviewed the triage vital signs and the nursing notes.  Pertinent labs & imaging results that were available during my care of the patient were reviewed by me and considered in my medical decision making (see chart for details).  Clinical Course as of Mar 05 1606  Thu Mar 05, 2018  1351 EKG  12-Lead [CC]    Clinical Course User Index [CC] Shreyansh Tiffany S, PA-C   4:06 PM CONSULT with  Dr. Maylene Roes from hospitalist service who recommended initiating ceftriaxone and azithromycin to cover community acquired pneumonia. She will admit pt.  Final Clinical Impressions(s) / ED Diagnoses   Final diagnoses:  Acute respiratory failure with hypoxia [facility])   Patient presented with shortness of breath and chest pain ongoing for last 2 days..  Has a history of asthma.  Received 3 nebulizer treatments prior to my seeing her and she is requiring 2 L nasal cannula.  Desats to the 80s without oxygen.  Does not normally require oxygen at home.  She is tachypneic in the 20s.  She is afebrile with normal heart rate blood pressure.  Cardiac exam is within normal limits, no murmurs auscultated.  Pulmonary exam with crackles bilaterally and some expiratory wheezing.  Abdomen soft and nontender.  No calf pain erythema or edema.  Patient was given additional nebulizer treatments as well as Solu-Medrol in the ED.  She had some improvement of her symptoms however she is still requiring oxygen.  Lab work significant for no leukocytosis or anemia.  Normal kidney and liver function.  Mild hypokalemia that was supplemented with K-Dur.  I-STAT UA are negative.  Troponin negative.  BNP negative therefore CHF less likely. D-dimer ordered and was slightly positive.  Chest x-ray shows opacity in both mid and lower lung zones, which could represent multifocal pneumonia.  Differential also includes atypical presentation of CHF or allergic type phenomenon.  Also with underlying pulmonary vascular congestion.  Patient has been afebrile and has had no cough therefore it is unclear whether this is related to pneumonia or other source.   CTA ordered to rule out PE and to further characterize opacities seen on chest x-ray.   CTA with diffuse bilat ground glass opacity that is nonspecific and could represent mycoplasma,  allergic/hypersensitivity pneumonitis, or pulmonary vasculitis. Unclear etiology at this point but infectious etiology seems somewhat less likely given pt has no leukocytosis, no fevers, and no cough. She did recently begin a new medication prior to onset of sxs so could be secondary to hypersensitivity pneumonitis especially given sxs improved somewhat after steroids. However, pt persistently requiring O2 to maintain sats and will require admission for further treatment of her acute respiratory failure with hypoxia.  Pt admitted to hospitalist service.  ED Discharge Orders    None       Rodney Booze, PA-C 03/05/18 [address] PA-C 03/05/18 1840    Sherwood Gambler, MD 03/07/18 [phone]

## 2018-03-05 NOTE — H&P (Signed)
History and Physical    Joan [name] QQV:[id number] DOB: 1965-10-21 DOA: 03/05/2018  PCP: Larina Earthly, MD  Patient coming from: Home  Chief Complaint: SOB, CP   HPI: Joan [name] is a 52 y.o. female with medical history significant of complex regional pain syndrome who presents with 2-day history of shortness of breath, cough, chest pain.  She states that she was recently started on a new medication Belbuca by her pain specialist.  This is when her symptoms started.  She admits to difficulty breathing, productive cough of green sputum as well as central chest pain.  No fevers at home, denies any chills, nausea, vomiting, diarrhea, abdominal pain, dysuria.  Sick contacts, travel or other immune compromising diseases such as HIV or cancer or TB.   ED Course: Patient was hypoxic at 84% on room air and was placed on nasal cannula O2.  Labs revealed elevated d-dimer 0.58.  She underwent CTA chest which was negative for PE.  However it did reveal diffuse bilateral abnormal pulmonary ground-glass opacity.  Breathing treatments, Solu-Medrol and started on empiric antibiotic.   Review of Systems: As per HPI otherwise 10 point review of systems negative.   Past Medical History:  Diagnosis Date  . Anemia   . Anxiety   . Asthma   . Chronic back pain   . Chronic neck pain   . Complex regional pain syndrome   . GERD (gastroesophageal reflux disease)   . Hematuria 09/07/2013  . Hypertension   . IBS (irritable bowel syndrome)   . Migraine   . Paresthesia of left arm     Past Surgical History:  Procedure Laterality Date  . DILITATION & CURRETTAGE/HYSTROSCOPY WITH THERMACHOICE ABLATION N/A 08/17/2013   Procedure: DILATATION & CURETTAGE/HYSTEROSCOPY WITH THERMACHOICE ABLATION;  Surgeon: Jonnie Kind, MD;  Location: [facility];  Service: Gynecology;  Laterality: N/A;  18 ml D5W in & out; total therapy time= 9 minutes 33 seconds; temp= 87 degrees celcius  . HERNIA REPAIR     ventral hernia  .  POLYPECTOMY N/A 08/17/2013   Procedure: REMOVAL ENDOMETRIAL POLYP;  Surgeon: Jonnie Kind, MD;  Location: [facility];  Service: Gynecology;  Laterality: N/A;  . TUBAL LIGATION    . ULNAR NERVE REPAIR Left    [facility] 2013     reports that she has been smoking cigarettes.  She has a 26.00 pack-year smoking history. She has never used smokeless tobacco. She reports that she does not drink alcohol or use drugs.  Allergies  Allergen Reactions  . Other Other (See Comments)    IV meds for migraine, patient stated it made her feel like something was crawling all over her body.    Family History  Problem Relation Age of Onset  . Diabetes Sister   . Hypertension Sister   . Heart disease Sister   . Cancer Sister        brain tumor  . Heart disease Brother   . Diabetes Brother   . Cancer Maternal Aunt        uterine  . Cancer Maternal Grandmother        uterine  . Other Mother        car accident    Prior to Admission medications   Medication Sig Start Date End Date Taking? Authorizing Provider  ALPRAZolam Duanne Moron) 0.5 MG tablet Take 0.5 mg by mouth 3 (three) times daily as needed for anxiety. 02/10/18  Yes [provider]  Cholecalciferol (VITAMIN D PO) Take 2  tablets by mouth daily.   Yes [provider]  divalproex (DEPAKOTE) 500 MG DR tablet Take 500 mg by mouth 2 (two) times daily.  04/15/16  Yes [provider]  DULoxetine (CYMBALTA) 60 MG capsule Take 1 capsule (60 mg total) by mouth daily. Patient taking differently: Take 60 mg by mouth 2 (two) times daily.  07/29/14  Yes Kirsteins, Luanna Salk, MD  gabapentin (NEURONTIN) 300 MG capsule Take 600 mg by mouth 4 (four) times daily.  05/14/16  Yes [provider]  ibuprofen (ADVIL,MOTRIN) 200 MG tablet Take 400-600 mg by mouth every 6 (six) hours as needed for mild pain.   Yes [provider]  Ipratropium-Albuterol (COMBIVENT RESPIMAT) 20-100 MCG/ACT AERS respimat Inhale 1 puff into the lungs every 6  (six) hours as needed for wheezing or shortness of breath.   Yes [provider]  LINZESS 145 MCG CAPS capsule Take 145 mcg by mouth daily as needed (IBS constipation).  05/10/14  Yes [provider]  lisinopril-hydrochlorothiazide (PRINZIDE,ZESTORETIC) 20-25 MG per tablet Take 1 tablet by mouth daily. 05/10/14  Yes [provider]  naloxone (NARCAN) nasal spray 4 mg/0.1 mL Place 1 spray into the nose daily as needed (overdose).   Yes [provider]  naproxen (NAPROSYN) 500 MG tablet Take 500 mg by mouth 2 (two) times daily with a meal. 02/28/18  Yes [provider]  oxycodone (OXY-IR) 5 MG capsule Take 5 mg by mouth every 4 (four) hours as needed for pain.   Yes [provider]  oxyCODONE (ROXICODONE) 15 MG immediate release tablet Take 15 mg by mouth 4 (four) times daily.   Yes [provider]  promethazine (PHENERGAN) 25 MG tablet Take 25 mg by mouth every 6 (six) hours as needed for nausea or vomiting.   Yes [provider]  topiramate (TOPAMAX) 100 MG tablet Take 100 mg by mouth daily as needed (migraine).  06/06/14  Yes [provider]  VENTOLIN HFA 108 (90 Base) MCG/ACT inhaler Inhale 1 puff into the lungs every 4 (four) hours as needed for shortness of breath or wheezing. 01/11/18  Yes [provider]  zolpidem (AMBIEN) 10 MG tablet Take 10 mg by mouth at bedtime.    Yes [provider]    Physical Exam: Vitals:   03/05/18 1445 03/05/18 1500 03/05/18 1525 03/05/18 1630  BP:  136/89  125/76  Pulse: 83 81  87  Resp: (!) 28 (!) 21  (!) 28  Temp:   98.9 F (37.2 C)   TempSrc:   Oral   SpO2: 92% (!) 88%  94%  Weight:      Height:        Constitutional: NAD, calm, comfortable Eyes: PERRL, lids and conjunctivae normal ENMT: Mucous membranes are moist. Posterior pharynx clear of any exudate or lesions.Normal dentition.  Neck: normal, supple, no masses, no thyromegaly Respiratory: Bilateral  wheezes with crackles diffusely.  On nasal cannula O2 without accessory muscle use or respiratory distress.  No conversational dyspnea Cardiovascular: Regular rate and rhythm, no murmurs / rubs / gallops. No extremity edema.  Abdomen: no tenderness, no masses palpated. No hepatosplenomegaly. Bowel sounds positive.  Musculoskeletal: no clubbing / cyanosis. No joint deformity upper and lower extremities. Good ROM, no contractures. Normal muscle tone.  Skin: no rashes, lesions, ulcers. No induration Neurologic: CN 2-12 grossly intact. Strength 5/5 in all 4.  Psychiatric: Normal judgment and insight. Alert and oriented x 3. Normal mood.   Labs on Admission: I  have personally reviewed following labs and imaging studies  CBC: Recent Labs  Lab 03/05/18 1221  WBC 8.0  NEUTROABS 6.1  HGB 12.0  HCT 36.5  MCV 84.5  PLT 027   Basic Metabolic Panel: Recent Labs  Lab 03/05/18 1221  NA 141  K 3.1*  CL 103  CO2 26  GLUCOSE 105*  BUN 8  CREATININE 0.74  CALCIUM 9.4   GFR: Estimated Creatinine Clearance: 96.3 mL/min (by C-G formula based on SCr of 0.74 mg/dL). Liver Function Tests: No results for input(s): AST, ALT, ALKPHOS, BILITOT, PROT, ALBUMIN in the last 168 hours. No results for input(s): LIPASE, AMYLASE in the last 168 hours. No results for input(s): AMMONIA in the last 168 hours. Coagulation Profile: No results for input(s): INR, PROTIME in the last 168 hours. Cardiac Enzymes: No results for input(s): CKTOTAL, CKMB, CKMBINDEX, TROPONINI in the last 168 hours. BNP (last 3 results) No results for input(s): PROBNP in the last 8760 hours. HbA1C: No results for input(s): HGBA1C in the last 72 hours. CBG: No results for input(s): GLUCAP in the last 168 hours. Lipid Profile: No results for input(s): CHOL, HDL, LDLCALC, TRIG, CHOLHDL, LDLDIRECT in the last 72 hours. Thyroid Function Tests: No results for input(s): TSH, T4TOTAL, FREET4, T3FREE, THYROIDAB in the last 72  hours. Anemia Panel: No results for input(s): VITAMINB12, FOLATE, FERRITIN, TIBC, IRON, RETICCTPCT in the last 72 hours. Urine analysis:    Component Value Date/Time   COLORURINE YELLOW 06/05/2016 0051   APPEARANCEUR CLOUDY (A) 06/05/2016 0051   LABSPEC 1.016 06/05/2016 0051   PHURINE 6.0 06/05/2016 0051   GLUCOSEU NEGATIVE 06/05/2016 0051   HGBUR NEGATIVE 06/05/2016 0051   BILIRUBINUR NEGATIVE 06/05/2016 0051   KETONESUR NEGATIVE 06/05/2016 0051   PROTEINUR NEGATIVE 06/05/2016 0051   UROBILINOGEN 1.0 07/31/2015 2028   NITRITE NEGATIVE 06/05/2016 0051   LEUKOCYTESUR TRACE (A) 06/05/2016 0051   Sepsis Labs: !!!!!!!!!!!!!!!!!!!!!!!!!!!!!!!!!!!!!!!!!!!! @LABRCNTIP (procalcitonin:4,lacticidven:4) )No results found for this or any previous visit (from the past 240 hour(s)).   Radiological Exams on Admission: Dg Chest 2 View  Result Date: 03/05/2018 CLINICAL DATA:  Wheezing and shortness of breath EXAM: CHEST - 2 VIEW COMPARISON:  June 07, 2016 FINDINGS: There is airspace consolidation throughout both mid and lower lung zones. Heart is mildly enlarged with pulmonary venous hypertension. No appreciable pleural effusions. No adenopathy appreciable. No evident bone lesions. IMPRESSION: Airspace opacity in both mid and lower lung zones, likely multifocal pneumonia. Differential considerations include atypical presentation of congestive heart failure and allergic type phenomenon. Pulmonary hemorrhage is a less likely differential consideration. There is underlying pulmonary vascular congestion. No adenopathy evident. Electronically Signed   By: Lowella Grip III M.D.   On: 03/05/2018 13:01   Ct Angio Chest Pe W And/or Wo Contrast  Result Date: 03/05/2018 CLINICAL DATA:  52 year old female with shortness of breath, wheezing. Positive D-dimer. EXAM: CT ANGIOGRAPHY CHEST WITH CONTRAST TECHNIQUE: Multidetector CT imaging of the chest was performed using the standard protocol during bolus  administration of intravenous contrast. Multiplanar CT image reconstructions and MIPs were obtained to evaluate the vascular anatomy. CONTRAST:  19mL ISOVUE-370 IOPAMIDOL (ISOVUE-370) INJECTION 76% COMPARISON:  Chest radiographs 1240 hours today. CT Abdomen and Pelvis 03/21/2015. FINDINGS: Cardiovascular: Adequate contrast bolus timing in the pulmonary arterial tree. Respiratory motion about the hila and superior lower lobes. No focal filling defect identified in the pulmonary arteries to suggest acute pulmonary embolism. Cardiac size is at the upper limits of normal. No pericardial effusion. Negative visible aorta. Mediastinum/Nodes: Upper  limits of normal mediastinal and bilateral hilar lymph nodes. Lungs/Pleura: The major airways are patent, but there is widespread and nearly diffuse bilateral peribronchial and scattered/peripheral pulmonary ground-glass opacity. All lobes are affected. The left lower lobe basilar segments are relatively spared. No superimposed septal thickening, pleural effusion, consolidation. Upper Abdomen: Negative visible liver, spleen, pancreas, adrenal glands, left kidney, and bowel in the upper abdomen. Musculoskeletal: Negative. Review of the MIP images confirms the above findings. IMPRESSION: 1.  Negative for acute pulmonary embolus. 2. Diffuse bilateral abnormal pulmonary ground-glass opacity is nonspecific. Top differential considerations include respiratory infection such as mycoplasma (PCP if immunocompromised), allergic or hypersensitivity pneumonitis, pulmonary vasculitis, less likely other chronic lung disease such as sarcoidosis. 3. Borderline to mild mediastinal and hilar lymphadenopathy, likely reactive. Electronically Signed   By: Genevie Ann M.D.   On: 03/05/2018 15:38    EKG: Independently reviewed.  Normal sinus rhythm  Assessment/Plan Principal Problem:   Acute hypoxemic respiratory failure (HCC) Active Problems:   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)   SOB  (shortness of breath)   Chronic pain syndrome   Hypokalemia   Depression   Anxiety   HTN (hypertension)   Acute hypoxic respiratory failure -Continue nasal cannula O2 as needed to maintain O2 sat greater than 92%  Diffuse bilateral pulmonary infiltrates -DDX: respiratory infection such as mycoplasma, allergic or hypersensitivity pneumonitis, pulmonary vasculitis, less likely other chronic lung disease such as sarcoidosis -Check HIV  -Treat with solumedrol, rocephin, azithromax  -Will need repeat imaging if no improvement   Hypokalemia -Replace, trend  Depression/Anxiety -Continue xanax, cymbalta  CRPS -Continue neurontin, oxyIR, oxycodone  -Patient started on Belbuca 2 days ago   Migraine -Continue depakote   HTN -Continue prinzide    Tobacco abuse -Smokes pack a day since age 10   DVT prophylaxis: Lovenox Code Status: Full  Family Communication: No family at bedside Disposition Plan: Pending improvement Consults called: None  Admission status: Inpatient    * I certify that at the point of admission it is my clinical judgment that the patient will require inpatient hospital care spanning beyond 2 midnights from the point of admission due to high intensity of service, high risk for further deterioration and high frequency of surveillance required.*   Dessa Phi, DO [facility] [email or web address] Password [email or web address] 03/05/2018, 5:09 PM

## 2018-03-05 NOTE — ED Notes (Signed)
Pt placed on 2L o2 via [place]

## 2018-03-05 NOTE — ED Notes (Signed)
ED TO INPATIENT HANDOFF REPORT  Name/Age/Gender Joan [name] 52 y.o. female  Code Status Code Status History    Date Active Date Inactive Code Status Order ID Comments User Context   06/05/2016 0123 06/07/2016 2127 Full Code [id number]  Etta Quill, DO ED   08/13/2013 2116 08/14/2013 1323 Full Code [id number]  Jonnie Kind, MD Inpatient      Home/SNF/Other Home  Chief Complaint Asthma Attack  Level of Care/Admitting Diagnosis ED Disposition    ED Disposition Condition [place] [facility] Area: [facility] [[id number]]  Level of Care: Telemetry [5]  Admit to tele based on following criteria: Other see comments  Comments: Hypoxemia  Diagnosis: Acute hypoxemic respiratory failure [facility]) [[id number]]  Admitting Physician: Dessa Phi [[id number]]  Attending Physician: Dessa Phi [phone]  Estimated length of stay: past midnight tomorrow  Certification:: I certify this patient will need inpatient services for at least 2 midnights  PT Class (Do Not Modify): Inpatient [101]  PT Acc Code (Do Not Modify): Private [1]       Medical History Past Medical History:  Diagnosis Date  . Anemia   . Anxiety   . Asthma   . Chronic back pain   . Chronic neck pain   . Complex regional pain syndrome   . GERD (gastroesophageal reflux disease)   . Hematuria 09/07/2013  . Hypertension   . IBS (irritable bowel syndrome)   . Migraine   . Paresthesia of left arm     Allergies Allergies  Allergen Reactions  . Other Other (See Comments)    IV meds for migraine, patient stated it made her feel like something was crawling all over her body.    IV Location/Drains/Wounds Patient Lines/Drains/Airways Status   Active Line/Drains/Airways    None          Labs/Imaging Results for orders placed or performed during the hospital encounter of 03/05/18 (from the past 48 hour(s))  CBC with Differential     Status: None   Collection Time: 03/05/18 12:21 PM   Result Value Ref Range   WBC 8.0 4.0 - 10.5 K/uL   RBC 4.32 3.87 - 5.11 MIL/uL   Hemoglobin 12.0 12.0 - 15.0 g/dL   HCT 36.5 36.0 - 46.0 %   MCV 84.5 78.0 - 100.0 fL   MCH 27.8 26.0 - 34.0 pg   MCHC 32.9 30.0 - 36.0 g/dL   RDW 14.1 11.5 - 15.5 %   Platelets 264 150 - 400 K/uL   Neutrophils Relative % 75 %   Neutro Abs 6.1 1.7 - 7.7 K/uL   Lymphocytes Relative 20 %   Lymphs Abs 1.6 0.7 - 4.0 K/uL   Monocytes Relative 3 %   Monocytes Absolute 0.3 0.1 - 1.0 K/uL   Eosinophils Relative 1 %   Eosinophils Absolute 0.1 0.0 - 0.7 K/uL   Basophils Relative 1 %   Basophils Absolute 0.0 0.0 - 0.1 K/uL    Comment: Performed at [facility], [place] [address], [place], [place] [address]  Basic metabolic panel     Status: Abnormal   Collection Time: 03/05/18 12:21 PM  Result Value Ref Range   Sodium 141 135 - 145 mmol/L   Potassium 3.1 (L) 3.5 - 5.1 mmol/L   Chloride 103 101 - 111 mmol/L   CO2 26 22 - 32 mmol/L   Glucose, Bld 105 (H) 65 - 99 mg/dL   BUN 8 6 - 20 mg/dL   Creatinine,  Ser 0.74 0.44 - 1.00 mg/dL   Calcium 9.4 8.9 - 10.3 mg/dL   GFR calc non Af Amer >60 >60 mL/min   GFR calc Af Amer >60 >60 mL/min    Comment: (NOTE) The eGFR has been calculated using the CKD EPI equation. This calculation has not been validated in all clinical situations. eGFR's persistently <60 mL/min signify possible Chronic Kidney Disease.    Anion gap 12 5 - 15    Comment: Performed at [facility], [address] [place], [place] [address]  D-dimer, quantitative (not at [facility])     Status: Abnormal   Collection Time: 03/05/18 12:21 PM  Result Value Ref Range   D-Dimer, Quant 0.58 (H) 0.00 - 0.50 ug/mL-FEU    Comment: (NOTE) At the manufacturer cut-off of 0.50 ug/mL FEU, this assay has been documented to exclude PE with a sensitivity and negative predictive value of 97 to 99%.  At this time, this assay has not been approved by the FDA to exclude DVT/VTE. Results  should be correlated with clinical presentation. Performed at [facility], [address] [place], [place] [address]   Brain natriuretic peptide     Status: None   Collection Time: 03/05/18 12:21 PM  Result Value Ref Range   B Natriuretic Peptide 31.9 0.0 - 100.0 pg/mL    Comment: Performed at [facility], [address] , [place] [address]  I-Stat Beta hCG blood, ED ([facility], [facility], [facility] only)     Status: None   Collection Time: 03/05/18 12:27 PM  Result Value Ref Range   I-stat hCG, quantitative <5.0 <5 mIU/mL   Comment 3            Comment:   GEST. AGE      CONC.  (mIU/mL)   <=1 WEEK        5 - 50     2 WEEKS       50 - 500     3 WEEKS       100 - 10,000     4 WEEKS     1,000 - 30,000        FEMALE AND NON-PREGNANT FEMALE:     LESS THAN 5 mIU/mL   I-Stat Troponin, ED (not at [facility])     Status: None   Collection Time: 03/05/18 12:27 PM  Result Value Ref Range   Troponin i, poc 0.00 0.00 - 0.08 ng/mL   Comment 3            Comment: Due to the release kinetics of cTnI, a negative result within the first hours of the onset of symptoms does not rule out myocardial infarction with certainty. If myocardial infarction is still suspected, repeat the test at appropriate intervals.    Dg Chest 2 View  Result Date: 03/05/2018 CLINICAL DATA:  Wheezing and shortness of breath EXAM: CHEST - 2 VIEW COMPARISON:  June 07, 2016 FINDINGS: There is airspace consolidation throughout both mid and lower lung zones. Heart is mildly enlarged with pulmonary venous hypertension. No appreciable pleural effusions. No adenopathy appreciable. No evident bone lesions. IMPRESSION: Airspace opacity in both mid and lower lung zones, likely multifocal pneumonia. Differential considerations include atypical presentation of congestive heart failure and allergic type phenomenon. Pulmonary hemorrhage is a less likely differential consideration. There is underlying pulmonary  vascular congestion. No adenopathy evident. Electronically Signed   By: William  Woodruff III M.D.   On: 03/05/2018 13:01   Ct Angio   Chest Pe W And/or Wo Contrast  Result Date: 03/05/2018 CLINICAL DATA:  52 year old female with shortness of breath, wheezing. Positive D-dimer. EXAM: CT ANGIOGRAPHY CHEST WITH CONTRAST TECHNIQUE: Multidetector CT imaging of the chest was performed using the standard protocol during bolus administration of intravenous contrast. Multiplanar CT image reconstructions and MIPs were obtained to evaluate the vascular anatomy. CONTRAST:  95m ISOVUE-370 IOPAMIDOL (ISOVUE-370) INJECTION 76% COMPARISON:  Chest radiographs 1240 hours today. CT Abdomen and Pelvis 03/21/2015. FINDINGS: Cardiovascular: Adequate contrast bolus timing in the pulmonary arterial tree. Respiratory motion about the hila and superior lower lobes. No focal filling defect identified in the pulmonary arteries to suggest acute pulmonary embolism. Cardiac size is at the upper limits of normal. No pericardial effusion. Negative visible aorta. Mediastinum/Nodes: Upper limits of normal mediastinal and bilateral hilar lymph nodes. Lungs/Pleura: The major airways are patent, but there is widespread and nearly diffuse bilateral peribronchial and scattered/peripheral pulmonary ground-glass opacity. All lobes are affected. The left lower lobe basilar segments are relatively spared. No superimposed septal thickening, pleural effusion, consolidation. Upper Abdomen: Negative visible liver, spleen, pancreas, adrenal glands, left kidney, and bowel in the upper abdomen. Musculoskeletal: Negative. Review of the MIP images confirms the above findings. IMPRESSION: 1.  Negative for acute pulmonary embolus. 2. Diffuse bilateral abnormal pulmonary ground-glass opacity is nonspecific. Top differential considerations include respiratory infection such as mycoplasma (PCP if immunocompromised), allergic or hypersensitivity pneumonitis, pulmonary  vasculitis, less likely other chronic lung disease such as sarcoidosis. 3. Borderline to mild mediastinal and hilar lymphadenopathy, likely reactive. Electronically Signed   By: HGenevie AnnM.D.   On: 03/05/2018 15:38    Pending Labs Unresulted Labs (From admission, onward)   None      Vitals/Pain Today's Vitals   03/05/18 1430 03/05/18 1445 03/05/18 1500 03/05/18 1525  BP: (!) 141/87  136/89   Pulse: 81 83 81   Resp: (!) 25 (!) 28 (!) 21   Temp:    98.9 F (37.2 C)  TempSrc:    Oral  SpO2: (!) 89% 92% (!) 88%   Weight:      Height:      PainSc:        Isolation Precautions No active isolations  Medications Medications  albuterol (PROVENTIL) (2.5 MG/3ML) 0.083% nebulizer solution 5 mg (5 mg Nebulization Not Given 03/05/18 1103)  iopamidol (ISOVUE-370) 76 % injection (has no administration in time range)  azithromycin (ZITHROMAX) 500 mg in sodium chloride 0.9 % 250 mL IVPB (has no administration in time range)  sodium chloride 0.9 % bolus 500 mL (0 mLs Intravenous Stopped 03/05/18 1346)  ipratropium-albuterol (DUONEB) 0.5-2.5 (3) MG/3ML nebulizer solution 3 mL (3 mLs Nebulization Given 03/05/18 1204)  methylPREDNISolone sodium succinate (SOLU-MEDROL) 125 mg/2 mL injection 125 mg (125 mg Intravenous Given 03/05/18 1203)  potassium chloride SA (K-DUR,KLOR-CON) CR tablet 40 mEq (40 mEq Oral Given 03/05/18 1342)  ipratropium-albuterol (DUONEB) 0.5-2.5 (3) MG/3ML nebulizer solution 3 mL (3 mLs Nebulization Given 03/05/18 1326)  oxyCODONE (Oxy IR/ROXICODONE) immediate release tablet 15 mg (15 mg Oral Given 03/05/18 1342)  iopamidol (ISOVUE-370) 76 % injection 100 mL (65 mLs Intravenous Contrast Given 03/05/18 1515)  ALPRAZolam (XANAX) tablet 0.5 mg (0.5 mg Oral Given 03/05/18 1618)  cefTRIAXone (ROCEPHIN) 1 g in sodium chloride 0.9 % 100 mL IVPB (1 g Intravenous New Bag/Given 03/05/18 1620)    Mobility walks with person assist

## 2018-03-05 NOTE — ED Triage Notes (Signed)
Patient here from work with complaints of wheezing. Hx of asthma. 2 breathing treatments. 10mg  albuterol, .5 Atrovent. States that she does not want an IV.

## 2018-03-06 DIAGNOSIS — R0602 Shortness of breath: Secondary | ICD-10-CM | POA: Diagnosis not present

## 2018-03-06 DIAGNOSIS — J9601 Acute respiratory failure with hypoxia: Principal | ICD-10-CM

## 2018-03-06 LAB — BASIC METABOLIC PANEL
Anion gap: 8 (ref 5–15)
BUN: 15 mg/dL (ref 6–20)
CHLORIDE: 106 mmol/L (ref 101–111)
CO2: 25 mmol/L (ref 22–32)
CREATININE: 0.61 mg/dL (ref 0.44–1.00)
Calcium: 9.2 mg/dL (ref 8.9–10.3)
GFR calc non Af Amer: >60 mL/min (ref >60)
Glucose, Bld: 154 mg/dL — ABNORMAL HIGH (ref 65–99)
POTASSIUM: 4.3 mmol/L (ref 3.5–5.1)
Sodium: 139 mmol/L (ref 135–145)

## 2018-03-06 LAB — CBC
HEMATOCRIT: 37 % (ref 36.0–46.0)
HEMOGLOBIN: 12.3 g/dL (ref 12.0–15.0)
MCH: 28.1 pg (ref 26.0–34.0)
MCHC: 33.2 g/dL (ref 30.0–36.0)
MCV: 84.5 fL (ref 78.0–100.0)
Platelets: 278 10*3/uL (ref 150–400)
RBC: 4.38 MIL/uL (ref 3.87–5.11)
RDW: 14.2 % (ref 11.5–15.5)
WBC: 11 10*3/uL — ABNORMAL HIGH (ref 4.0–10.5)

## 2018-03-06 LAB — HIV ANTIBODY (ROUTINE TESTING W REFLEX): HIV SCREEN 4TH GENERATION: NONREACTIVE

## 2018-03-06 MED ORDER — PREDNISONE 50 MG PO TABS: 50.0000 mg | ORAL_TABLET | Freq: Every day | ORAL | 0 refills | Status: DC

## 2018-03-06 MED ORDER — DOXYCYCLINE HYCLATE 100 MG PO CAPS: 100.0000 mg | ORAL_CAPSULE | Freq: Two times a day (BID) | ORAL | 0 refills | Status: DC

## 2018-03-06 MED ORDER — NAPROXEN 500 MG PO TABS
500.0000 mg | ORAL_TABLET | Freq: Two times a day (BID) | ORAL | Status: DC
  Administered 2018-03-06: 500 mg via ORAL
  Filled 2018-03-06: qty 1

## 2018-03-06 NOTE — Plan of Care (Signed)
Pt would like to go home, but experiences dyspnea while rolling over in bed to get on BP. Pt sating at 95 on 5 L

## 2018-03-06 NOTE — Progress Notes (Signed)
A call was made to [facility] to confirm code 44. MD did not change to Obs status, pt was ready to leave. Code 24 was canceled. Pt left at 1310.

## 2018-03-06 NOTE — Discharge Summary (Signed)
Physician Discharge Summary  Joan [name] YDX:[id number] DOB: 1965-11-28 DOA: 03/05/2018  PCP: Larina Earthly, MD  Admit date: 03/05/2018 Discharge date: 03/06/2018  Time spent:  minutes  Recommendations for Outpatient Follow-up:  1. Please avoid new medication Belbuca as patient reported trouble breathing after ingesting this medication   Discharge Diagnoses:  Principal Problem:   Acute hypoxemic respiratory failure ([name]) Active Problems:   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)   SOB (shortness of breath)   Chronic pain syndrome   Hypokalemia   Depression   Anxiety   HTN (hypertension)   Discharge Condition: stable  Diet recommendation: Regular diet  Filed Weights   03/05/18 1126 03/05/18 1727  Weight: 90.3 kg (199 lb) 90.7 kg (199 lb 15.3 oz)    History of present illness:  52 y.o. female with medical history significant of complex regional pain syndrome who presents with 2-day history of shortness of breath, cough, chest pain.  She states that she was recently started on a new medication Belbuca by her pain specialist.    Hospital Course:  Acute hypoxic respiratory failure -Resolved patient speaking in full sentences on room air and requesting discharge.  Diffuse bilateral pulmonary infiltrates -DDX: respiratory infection such as mycoplasma, allergic or hypersensitivity pneumonitis, pulmonary vasculitis, less likely other chronic lung disease such as sarcoidosis - Pt improved on antibiotic and steroids. Will d/c with prednisone and doxycycline.   Hypokalemia -WNL on last check. resolved  Depression/Anxiety -Continue xanax, cymbalta  CRPS -Continue neurontin, oxyIR, oxycodone  -Recommended avoiding Belbuca    Migraine -Continue depakote   HTN -Continue prior to admission medication regimen.   Tobacco abuse -recommended cessation   Procedures:  None  Consultations:  None  Discharge Exam: Vitals:   03/06/18 0506 03/06/18 0920   BP: 132/81   Pulse: 65   Resp: (!) 25   Temp: 98.9 F (37.2 C)   SpO2: (!) 89% 94%    General: Pt in nad, alert and awake Cardiovascular: rrr, no rubs Respiratory: no increased wob, no wheezes  Discharge Instructions   Discharge Instructions    Call MD for:  difficulty breathing, headache or visual disturbances   Complete by:  As directed    Call MD for:  severe uncontrolled pain   Complete by:  As directed    Call MD for:  temperature >100.4   Complete by:  As directed    Diet - low sodium heart healthy   Complete by:  As directed    Increase activity slowly   Complete by:  As directed      Allergies as of 03/06/2018      Reactions   Other Other (See Comments)   IV meds for migraine, patient stated it made her feel like something was crawling all over her body.      Medication List    STOP taking these medications   ibuprofen 200 MG tablet Commonly known as:  ADVIL,MOTRIN     TAKE these medications   ALPRAZolam 0.5 MG tablet Commonly known as:  XANAX Take 0.5 mg by mouth 3 (three) times daily as needed for anxiety.   AMBIEN 10 MG tablet Generic drug:  zolpidem Take 10 mg by mouth at bedtime.   COMBIVENT RESPIMAT 20-100 MCG/ACT Aers respimat Generic drug:  Ipratropium-Albuterol Inhale 1 puff into the lungs every 6 (six) hours as needed for wheezing or shortness of breath.   divalproex 500 MG DR tablet Commonly known as:  DEPAKOTE Take 500 mg by mouth 2 (  two) times daily.   doxycycline 100 MG capsule Commonly known as:  VIBRAMYCIN Take 1 capsule (100 mg total) by mouth 2 (two) times daily.   DULoxetine 60 MG capsule Commonly known as:  CYMBALTA Take 1 capsule (60 mg total) by mouth daily. What changed:  when to take this   gabapentin 300 MG capsule Commonly known as:  NEURONTIN Take 600 mg by mouth 4 (four) times daily.   LINZESS 145 MCG Caps capsule Generic drug:  linaclotide Take 145 mcg by mouth daily as needed (IBS constipation).    lisinopril-hydrochlorothiazide 20-25 MG tablet Commonly known as:  PRINZIDE,ZESTORETIC Take 1 tablet by mouth daily.   naloxone 4 MG/0.1ML Liqd nasal spray kit Commonly known as:  NARCAN Place 1 spray into the nose daily as needed (overdose).   naproxen 500 MG tablet Commonly known as:  NAPROSYN Take 500 mg by mouth 2 (two) times daily with a meal.   oxycodone 5 MG capsule Commonly known as:  OXY-IR Take 5 mg by mouth every 4 (four) hours as needed for pain.   oxyCODONE 15 MG immediate release tablet Commonly known as:  ROXICODONE Take 15 mg by mouth 4 (four) times daily.   predniSONE 50 MG tablet Commonly known as:  DELTASONE Take 1 tablet (50 mg total) by mouth daily.   promethazine 25 MG tablet Commonly known as:  PHENERGAN Take 25 mg by mouth every 6 (six) hours as needed for nausea or vomiting.   topiramate 100 MG tablet Commonly known as:  TOPAMAX Take 100 mg by mouth daily as needed (migraine).   VENTOLIN HFA 108 (90 Base) MCG/ACT inhaler Generic drug:  albuterol Inhale 1 puff into the lungs every 4 (four) hours as needed for shortness of breath or wheezing.   VITAMIN D PO Take 2 tablets by mouth daily.      Allergies  Allergen Reactions  . Other Other (See Comments)    IV meds for migraine, patient stated it made her feel like something was crawling all over her body.      The results of significant diagnostics from this hospitalization (including imaging, microbiology, ancillary and laboratory) are listed below for reference.    Significant Diagnostic Studies: Dg Chest 2 View  Result Date: 03/05/2018 CLINICAL DATA:  Wheezing and shortness of breath EXAM: CHEST - 2 VIEW COMPARISON:  June 07, 2016 FINDINGS: There is airspace consolidation throughout both mid and lower lung zones. Heart is mildly enlarged with pulmonary venous hypertension. No appreciable pleural effusions. No adenopathy appreciable. No evident bone lesions. IMPRESSION: Airspace  opacity in both mid and lower lung zones, likely multifocal pneumonia. Differential considerations include atypical presentation of congestive heart failure and allergic type phenomenon. Pulmonary hemorrhage is a less likely differential consideration. There is underlying pulmonary vascular congestion. No adenopathy evident. Electronically Signed   By: Lowella Grip III M.D.   On: 03/05/2018 13:01   Ct Angio Chest Pe W And/or Wo Contrast  Result Date: 03/05/2018 CLINICAL DATA:  52 year old female with shortness of breath, wheezing. Positive D-dimer. EXAM: CT ANGIOGRAPHY CHEST WITH CONTRAST TECHNIQUE: Multidetector CT imaging of the chest was performed using the standard protocol during bolus administration of intravenous contrast. Multiplanar CT image reconstructions and MIPs were obtained to evaluate the vascular anatomy. CONTRAST:  16m ISOVUE-370 IOPAMIDOL (ISOVUE-370) INJECTION 76% COMPARISON:  Chest radiographs 1240 hours today. CT Abdomen and Pelvis 03/21/2015. FINDINGS: Cardiovascular: Adequate contrast bolus timing in the pulmonary arterial tree. Respiratory motion about the hila and superior lower lobes. No  focal filling defect identified in the pulmonary arteries to suggest acute pulmonary embolism. Cardiac size is at the upper limits of normal. No pericardial effusion. Negative visible aorta. Mediastinum/Nodes: Upper limits of normal mediastinal and bilateral hilar lymph nodes. Lungs/Pleura: The major airways are patent, but there is widespread and nearly diffuse bilateral peribronchial and scattered/peripheral pulmonary ground-glass opacity. All lobes are affected. The left lower lobe basilar segments are relatively spared. No superimposed septal thickening, pleural effusion, consolidation. Upper Abdomen: Negative visible liver, spleen, pancreas, adrenal glands, left kidney, and bowel in the upper abdomen. Musculoskeletal: Negative. Review of the MIP images confirms the above findings.  IMPRESSION: 1.  Negative for acute pulmonary embolus. 2. Diffuse bilateral abnormal pulmonary ground-glass opacity is nonspecific. Top differential considerations include respiratory infection such as mycoplasma (PCP if immunocompromised), allergic or hypersensitivity pneumonitis, pulmonary vasculitis, less likely other chronic lung disease such as sarcoidosis. 3. Borderline to mild mediastinal and hilar lymphadenopathy, likely reactive. Electronically Signed   By: Genevie Ann M.D.   On: 03/05/2018 15:38    Microbiology: No results found for this or any previous visit (from the past 240 hour(s)).   Labs: Basic Metabolic Panel: Recent Labs  Lab 03/05/18 1221 03/06/18 0411  NA 141 139  K 3.1* 4.3  CL 103 106  CO2 26 25  GLUCOSE 105* 154*  BUN 8 15  CREATININE 0.74 0.61  CALCIUM 9.4 9.2   Liver Function Tests: No results for input(s): AST, ALT, ALKPHOS, BILITOT, PROT, ALBUMIN in the last 168 hours. No results for input(s): LIPASE, AMYLASE in the last 168 hours. No results for input(s): AMMONIA in the last 168 hours. CBC: Recent Labs  Lab 03/05/18 1221 03/06/18 0411  WBC 8.0 11.0*  NEUTROABS 6.1  --   HGB 12.0 12.3  HCT 36.5 37.0  MCV 84.5 84.5  PLT 264 278   Cardiac Enzymes: No results for input(s): CKTOTAL, CKMB, CKMBINDEX, TROPONINI in the last 168 hours. BNP: BNP (last 3 results) Recent Labs    03/05/18 1221  BNP 31.9    ProBNP (last 3 results) No results for input(s): PROBNP in the last 8760 hours.  CBG: No results for input(s): GLUCAP in the last 168 hours.     Signed:  Velvet Bathe MD.  [facility] 03/06/2018, 10:50 AM

## 2018-05-02 ENCOUNTER — Inpatient Hospital Stay (HOSPITAL_COMMUNITY)
Admission: EM | Admit: 2018-05-02 | Discharge: 2018-05-05 | DRG: 871 | Disposition: A | Discharge status: Home / Self Care | Payer: Medicare HMO | Attending: Internal Medicine | Admitting: Internal Medicine

## 2018-05-02 ENCOUNTER — Encounter (HOSPITAL_COMMUNITY): Payer: Self-pay | Admitting: Nurse Practitioner

## 2018-05-02 ENCOUNTER — Emergency Department (HOSPITAL_COMMUNITY): Payer: Medicare HMO

## 2018-05-02 DIAGNOSIS — Z833 Family history of diabetes mellitus: Secondary | ICD-10-CM

## 2018-05-02 DIAGNOSIS — R079 Chest pain, unspecified: Secondary | ICD-10-CM | POA: Diagnosis present

## 2018-05-02 DIAGNOSIS — A419 Sepsis, unspecified organism: Secondary | ICD-10-CM | POA: Diagnosis not present

## 2018-05-02 DIAGNOSIS — E876 Hypokalemia: Secondary | ICD-10-CM | POA: Diagnosis present

## 2018-05-02 DIAGNOSIS — R6510 Systemic inflammatory response syndrome (SIRS) of non-infectious origin without acute organ dysfunction: Secondary | ICD-10-CM | POA: Diagnosis present

## 2018-05-02 DIAGNOSIS — Z79891 Long term (current) use of opiate analgesic: Secondary | ICD-10-CM

## 2018-05-02 DIAGNOSIS — G5640 Causalgia of unspecified upper limb: Secondary | ICD-10-CM | POA: Diagnosis present

## 2018-05-02 DIAGNOSIS — Z791 Long term (current) use of non-steroidal anti-inflammatories (NSAID): Secondary | ICD-10-CM

## 2018-05-02 DIAGNOSIS — F17210 Nicotine dependence, cigarettes, uncomplicated: Secondary | ICD-10-CM | POA: Diagnosis present

## 2018-05-02 DIAGNOSIS — F419 Anxiety disorder, unspecified: Secondary | ICD-10-CM | POA: Diagnosis present

## 2018-05-02 DIAGNOSIS — I10 Essential (primary) hypertension: Secondary | ICD-10-CM | POA: Diagnosis present

## 2018-05-02 DIAGNOSIS — Z720 Tobacco use: Secondary | ICD-10-CM | POA: Diagnosis present

## 2018-05-02 DIAGNOSIS — G43909 Migraine, unspecified, not intractable, without status migrainosus: Secondary | ICD-10-CM | POA: Diagnosis present

## 2018-05-02 DIAGNOSIS — J9601 Acute respiratory failure with hypoxia: Secondary | ICD-10-CM | POA: Diagnosis not present

## 2018-05-02 DIAGNOSIS — J45901 Unspecified asthma with (acute) exacerbation: Secondary | ICD-10-CM | POA: Diagnosis present

## 2018-05-02 DIAGNOSIS — Z8249 Family history of ischemic heart disease and other diseases of the circulatory system: Secondary | ICD-10-CM

## 2018-05-02 DIAGNOSIS — F329 Major depressive disorder, single episode, unspecified: Secondary | ICD-10-CM | POA: Diagnosis present

## 2018-05-02 DIAGNOSIS — G9050 Complex regional pain syndrome I, unspecified: Secondary | ICD-10-CM | POA: Diagnosis present

## 2018-05-02 DIAGNOSIS — R918 Other nonspecific abnormal finding of lung field: Secondary | ICD-10-CM

## 2018-05-02 DIAGNOSIS — J189 Pneumonia, unspecified organism: Secondary | ICD-10-CM | POA: Diagnosis present

## 2018-05-02 DIAGNOSIS — F32A Depression, unspecified: Secondary | ICD-10-CM | POA: Diagnosis present

## 2018-05-02 MED ORDER — ALBUTEROL SULFATE (2.5 MG/3ML) 0.083% IN NEBU
5.0000 mg | INHALATION_SOLUTION | Freq: Once | RESPIRATORY_TRACT | Status: AC
  Administered 2018-05-03: 5 mg via RESPIRATORY_TRACT
  Filled 2018-05-02: qty 6

## 2018-05-02 NOTE — ED Triage Notes (Signed)
Pt is presented by EMS from home, c/o persistent shortness of breath related to a probable asthma attack, she has received 2 rounds on neb treatments and 125 mg IV solumedral without getting relief. Pt endorses hx of hospitalizations and intubation for similar presenting symptoms. Reports some chest discomfort.

## 2018-05-02 NOTE — ED Notes (Signed)
Patient transported to X-ray 

## 2018-05-02 NOTE — ED Notes (Signed)
Bed: WA04 Expected date:  Expected time:  Means of arrival:  Comments: 20 F SOB hx Asthma

## 2018-05-03 ENCOUNTER — Other Ambulatory Visit: Payer: Self-pay

## 2018-05-03 ENCOUNTER — Inpatient Hospital Stay (HOSPITAL_COMMUNITY): Payer: Medicare HMO

## 2018-05-03 DIAGNOSIS — F17210 Nicotine dependence, cigarettes, uncomplicated: Secondary | ICD-10-CM | POA: Diagnosis present

## 2018-05-03 DIAGNOSIS — R079 Chest pain, unspecified: Secondary | ICD-10-CM | POA: Diagnosis not present

## 2018-05-03 DIAGNOSIS — G9050 Complex regional pain syndrome I, unspecified: Secondary | ICD-10-CM | POA: Diagnosis present

## 2018-05-03 DIAGNOSIS — J45901 Unspecified asthma with (acute) exacerbation: Secondary | ICD-10-CM | POA: Diagnosis present

## 2018-05-03 DIAGNOSIS — Z791 Long term (current) use of non-steroidal anti-inflammatories (NSAID): Secondary | ICD-10-CM | POA: Diagnosis not present

## 2018-05-03 DIAGNOSIS — G43909 Migraine, unspecified, not intractable, without status migrainosus: Secondary | ICD-10-CM | POA: Diagnosis present

## 2018-05-03 DIAGNOSIS — Z833 Family history of diabetes mellitus: Secondary | ICD-10-CM | POA: Diagnosis not present

## 2018-05-03 DIAGNOSIS — R6510 Systemic inflammatory response syndrome (SIRS) of non-infectious origin without acute organ dysfunction: Secondary | ICD-10-CM

## 2018-05-03 DIAGNOSIS — F419 Anxiety disorder, unspecified: Secondary | ICD-10-CM | POA: Diagnosis present

## 2018-05-03 DIAGNOSIS — J189 Pneumonia, unspecified organism: Secondary | ICD-10-CM

## 2018-05-03 DIAGNOSIS — A419 Sepsis, unspecified organism: Secondary | ICD-10-CM | POA: Diagnosis present

## 2018-05-03 DIAGNOSIS — J9601 Acute respiratory failure with hypoxia: Secondary | ICD-10-CM

## 2018-05-03 DIAGNOSIS — Z8249 Family history of ischemic heart disease and other diseases of the circulatory system: Secondary | ICD-10-CM | POA: Diagnosis not present

## 2018-05-03 DIAGNOSIS — Z720 Tobacco use: Secondary | ICD-10-CM | POA: Diagnosis present

## 2018-05-03 DIAGNOSIS — I10 Essential (primary) hypertension: Secondary | ICD-10-CM | POA: Diagnosis present

## 2018-05-03 DIAGNOSIS — F329 Major depressive disorder, single episode, unspecified: Secondary | ICD-10-CM | POA: Diagnosis present

## 2018-05-03 DIAGNOSIS — E876 Hypokalemia: Secondary | ICD-10-CM | POA: Diagnosis present

## 2018-05-03 DIAGNOSIS — Z79891 Long term (current) use of opiate analgesic: Secondary | ICD-10-CM | POA: Diagnosis not present

## 2018-05-03 DIAGNOSIS — G5640 Causalgia of unspecified upper limb: Secondary | ICD-10-CM | POA: Diagnosis not present

## 2018-05-03 DIAGNOSIS — R918 Other nonspecific abnormal finding of lung field: Secondary | ICD-10-CM

## 2018-05-03 LAB — CBC
HEMATOCRIT: 38.4 % (ref 36.0–46.0)
HEMOGLOBIN: 12.6 g/dL (ref 12.0–15.0)
MCH: 28.1 pg (ref 26.0–34.0)
MCHC: 32.8 g/dL (ref 30.0–36.0)
MCV: 85.5 fL (ref 78.0–100.0)
Platelets: 235 10*3/uL (ref 150–400)
RBC: 4.49 MIL/uL (ref 3.87–5.11)
RDW: 15.1 % (ref 11.5–15.5)
WBC: 11.8 10*3/uL — ABNORMAL HIGH (ref 4.0–10.5)

## 2018-05-03 LAB — I-STAT CG4 LACTIC ACID, ED: Lactic Acid, Venous: 3.32 mmol/L (ref 0.5–1.9)

## 2018-05-03 LAB — BASIC METABOLIC PANEL
Anion gap: 11 (ref 5–15)
BUN: 7 mg/dL (ref 6–20)
CHLORIDE: 106 mmol/L (ref 98–111)
CO2: 24 mmol/L (ref 22–32)
CREATININE: 0.62 mg/dL (ref 0.44–1.00)
Calcium: 8.5 mg/dL — ABNORMAL LOW (ref 8.9–10.3)
GFR calc non Af Amer: >60 mL/min (ref >60)
Glucose, Bld: 187 mg/dL — ABNORMAL HIGH (ref 70–99)
POTASSIUM: 3.5 mmol/L (ref 3.5–5.1)
Sodium: 141 mmol/L (ref 135–145)

## 2018-05-03 LAB — I-STAT CHEM 8, ED
BUN: 5 mg/dL — AB (ref 6–20)
CALCIUM ION: 1.21 mmol/L (ref 1.15–1.40)
CREATININE: 0.5 mg/dL (ref 0.44–1.00)
Chloride: 104 mmol/L (ref 98–111)
Glucose, Bld: 205 mg/dL — ABNORMAL HIGH (ref 70–99)
HEMATOCRIT: 37 % (ref 36.0–46.0)
Hemoglobin: 12.6 g/dL (ref 12.0–15.0)
Potassium: 3.2 mmol/L — ABNORMAL LOW (ref 3.5–5.1)
Sodium: 140 mmol/L (ref 135–145)
TCO2: 24 mmol/L (ref 22–32)

## 2018-05-03 LAB — COMPREHENSIVE METABOLIC PANEL
ALT: 12 U/L (ref 0–44)
AST: 51 U/L — AB (ref 15–41)
Albumin: 3.4 g/dL — ABNORMAL LOW (ref 3.5–5.0)
Alkaline Phosphatase: 46 U/L (ref 38–126)
Anion gap: 11 (ref 5–15)
BILIRUBIN TOTAL: 0.6 mg/dL (ref 0.3–1.2)
BUN: 8 mg/dL (ref 6–20)
CHLORIDE: 106 mmol/L (ref 98–111)
CO2: 23 mmol/L (ref 22–32)
Calcium: 8.8 mg/dL — ABNORMAL LOW (ref 8.9–10.3)
Creatinine, Ser: 0.57 mg/dL (ref 0.44–1.00)
Glucose, Bld: 196 mg/dL — ABNORMAL HIGH (ref 70–99)
Potassium: 3.3 mmol/L — ABNORMAL LOW (ref 3.5–5.1)
Sodium: 140 mmol/L (ref 135–145)
Total Protein: 6.7 g/dL (ref 6.5–8.1)

## 2018-05-03 LAB — RAPID URINE DRUG SCREEN, HOSP PERFORMED
Amphetamines: NOT DETECTED
Amphetamines: NOT DETECTED
BARBITURATES: NOT DETECTED
Barbiturates: NOT DETECTED
Benzodiazepines: NOT DETECTED
Benzodiazepines: POSITIVE — AB
Cocaine: NOT DETECTED
Cocaine: NOT DETECTED
TETRAHYDROCANNABINOL: NOT DETECTED
Tetrahydrocannabinol: NOT DETECTED

## 2018-05-03 LAB — BLOOD GAS, ARTERIAL
Acid-base deficit: 0.2 mmol/L (ref 0.0–2.0)
BICARBONATE: 23.4 mmol/L (ref 20.0–28.0)
Drawn by: 235321
O2 Content: 6 L/min
O2 Saturation: 88.7 %
PCO2 ART: 36.4 mmHg (ref 32.0–48.0)
PH ART: 7.424 (ref 7.350–7.450)
PO2 ART: 57.1 mmHg — AB (ref 83.0–108.0)
Patient temperature: 98.6

## 2018-05-03 LAB — CBC WITH DIFFERENTIAL/PLATELET
Basophils Absolute: 0 10*3/uL (ref 0.0–0.1)
Basophils Relative: 0 %
EOS PCT: 1 %
Eosinophils Absolute: 0.1 10*3/uL (ref 0.0–0.7)
HEMATOCRIT: 37.1 % (ref 36.0–46.0)
Hemoglobin: 12.2 g/dL (ref 12.0–15.0)
LYMPHS ABS: 1.4 10*3/uL (ref 0.7–4.0)
LYMPHS PCT: 12 %
MCH: 27.9 pg (ref 26.0–34.0)
MCHC: 32.9 g/dL (ref 30.0–36.0)
MCV: 84.9 fL (ref 78.0–100.0)
MONO ABS: 0.3 10*3/uL (ref 0.1–1.0)
Monocytes Relative: 2 %
NEUTROS ABS: 10 10*3/uL — AB (ref 1.7–7.7)
Neutrophils Relative %: 85 %
PLATELETS: 235 10*3/uL (ref 150–400)
RBC: 4.37 MIL/uL (ref 3.87–5.11)
RDW: 14.9 % (ref 11.5–15.5)
WBC: 11.7 10*3/uL — ABNORMAL HIGH (ref 4.0–10.5)

## 2018-05-03 LAB — SEDIMENTATION RATE: Sed Rate: 14 mm/hr (ref 0–22)

## 2018-05-03 LAB — MAGNESIUM: Magnesium: 1.7 mg/dL (ref 1.7–2.4)

## 2018-05-03 LAB — I-STAT TROPONIN, ED: Troponin i, poc: 0 ng/mL (ref 0.00–0.08)

## 2018-05-03 LAB — LACTIC ACID, PLASMA
Lactic Acid, Venous: 3 mmol/L (ref 0.5–1.9)
Lactic Acid, Venous: 2.5 mmol/L (ref 0.5–1.9)

## 2018-05-03 LAB — APTT: APTT: 31 s (ref 24–36)

## 2018-05-03 LAB — TROPONIN I

## 2018-05-03 LAB — BRAIN NATRIURETIC PEPTIDE: B NATRIURETIC PEPTIDE 5: 38.3 pg/mL (ref 0.0–100.0)

## 2018-05-03 LAB — STREP PNEUMONIAE URINARY ANTIGEN: Strep Pneumo Urinary Antigen: NEGATIVE

## 2018-05-03 LAB — PROTIME-INR
INR: 1.16
PROTHROMBIN TIME: 14.8 s (ref 11.4–15.2)

## 2018-05-03 LAB — MRSA PCR SCREENING: MRSA BY PCR: NEGATIVE

## 2018-05-03 LAB — PROCALCITONIN: PROCALCITONIN: 0.13 ng/mL

## 2018-05-03 MED ORDER — LISINOPRIL 10 MG PO TABS
20.0000 mg | ORAL_TABLET | Freq: Every day | ORAL | Status: DC
  Administered 2018-05-03: 20 mg via ORAL
  Filled 2018-05-03: qty 2

## 2018-05-03 MED ORDER — HYDROCHLOROTHIAZIDE 25 MG PO TABS
25.0000 mg | ORAL_TABLET | Freq: Every day | ORAL | Status: DC
  Administered 2018-05-03 – 2018-05-05 (×3): 25 mg via ORAL
  Filled 2018-05-03 (×3): qty 1

## 2018-05-03 MED ORDER — METHYLPREDNISOLONE SODIUM SUCC 125 MG IJ SOLR
80.0000 mg | Freq: Three times a day (TID) | INTRAMUSCULAR | Status: DC
  Administered 2018-05-03 – 2018-05-05 (×5): 80 mg via INTRAVENOUS
  Filled 2018-05-03 (×5): qty 2

## 2018-05-03 MED ORDER — ACETAMINOPHEN 650 MG RE SUPP: 650.0000 mg | Freq: Four times a day (QID) | RECTAL | Status: DC | PRN

## 2018-05-03 MED ORDER — PANTOPRAZOLE SODIUM 40 MG PO TBEC
40.0000 mg | DELAYED_RELEASE_TABLET | Freq: Two times a day (BID) | ORAL | Status: DC
  Administered 2018-05-03 – 2018-05-05 (×4): 40 mg via ORAL
  Filled 2018-05-03 (×4): qty 1

## 2018-05-03 MED ORDER — SODIUM CHLORIDE 0.9 % IV SOLN
500.0000 mg | Freq: Every day | INTRAVENOUS | Status: DC
  Administered 2018-05-03 – 2018-05-04 (×3): 500 mg via INTRAVENOUS
  Filled 2018-05-03 (×3): qty 500

## 2018-05-03 MED ORDER — SODIUM CHLORIDE 0.9 % IV SOLN
2.0000 g | Freq: Three times a day (TID) | INTRAVENOUS | Status: DC
  Administered 2018-05-03 – 2018-05-05 (×7): 2 g via INTRAVENOUS
  Filled 2018-05-03 (×8): qty 2

## 2018-05-03 MED ORDER — OXYCODONE HCL 5 MG PO TABS
5.0000 mg | ORAL_TABLET | ORAL | Status: DC | PRN
  Administered 2018-05-03 – 2018-05-05 (×2): 5 mg via ORAL
  Filled 2018-05-03 (×3): qty 1

## 2018-05-03 MED ORDER — VANCOMYCIN HCL IN DEXTROSE 1-5 GM/200ML-% IV SOLN
1000.0000 mg | Freq: Once | INTRAVENOUS | Status: AC
  Administered 2018-05-03: 1000 mg via INTRAVENOUS
  Filled 2018-05-03: qty 200

## 2018-05-03 MED ORDER — OXYCODONE HCL 5 MG PO TABS
15.0000 mg | ORAL_TABLET | Freq: Four times a day (QID) | ORAL | Status: DC
  Administered 2018-05-03 – 2018-05-05 (×10): 15 mg via ORAL
  Filled 2018-05-03 (×11): qty 3

## 2018-05-03 MED ORDER — POTASSIUM CHLORIDE 10 MEQ/100ML IV SOLN
10.0000 meq | INTRAVENOUS | Status: AC
  Administered 2018-05-03 (×2): 10 meq via INTRAVENOUS
  Filled 2018-05-03 (×2): qty 100

## 2018-05-03 MED ORDER — SODIUM CHLORIDE 0.9 % IV BOLUS
2000.0000 mL | Freq: Once | INTRAVENOUS | Status: AC
  Administered 2018-05-03: 2000 mL via INTRAVENOUS

## 2018-05-03 MED ORDER — SODIUM CHLORIDE 0.9 % IV SOLN
2.0000 g | Freq: Once | INTRAVENOUS | Status: AC
  Administered 2018-05-03: 2 g via INTRAVENOUS
  Filled 2018-05-03: qty 2

## 2018-05-03 MED ORDER — DIVALPROEX SODIUM ER 250 MG PO TB24
500.0000 mg | ORAL_TABLET | Freq: Every day | ORAL | Status: DC
  Administered 2018-05-03 – 2018-05-05 (×3): 500 mg via ORAL
  Filled 2018-05-03 (×3): qty 2

## 2018-05-03 MED ORDER — IPRATROPIUM-ALBUTEROL 0.5-2.5 (3) MG/3ML IN SOLN
3.0000 mL | Freq: Three times a day (TID) | RESPIRATORY_TRACT | Status: DC
  Administered 2018-05-03 – 2018-05-05 (×2): 3 mL via RESPIRATORY_TRACT
  Filled 2018-05-03 (×5): qty 3

## 2018-05-03 MED ORDER — VANCOMYCIN HCL IN DEXTROSE 1-5 GM/200ML-% IV SOLN: 1000.0000 mg | Freq: Three times a day (TID) | INTRAVENOUS | Status: DC

## 2018-05-03 MED ORDER — IOPAMIDOL (ISOVUE-370) INJECTION 76%
INTRAVENOUS | Status: AC
  Administered 2018-05-03: 100 mL
  Filled 2018-05-03: qty 100

## 2018-05-03 MED ORDER — CLONIDINE HCL 0.1 MG PO TABS
0.1000 mg | ORAL_TABLET | Freq: Two times a day (BID) | ORAL | Status: DC
  Administered 2018-05-03 – 2018-05-05 (×4): 0.1 mg via ORAL
  Filled 2018-05-03 (×4): qty 1

## 2018-05-03 MED ORDER — ZOLPIDEM TARTRATE 5 MG PO TABS
5.0000 mg | ORAL_TABLET | Freq: Every day | ORAL | Status: DC
  Administered 2018-05-03: 5 mg via ORAL
  Filled 2018-05-03: qty 1

## 2018-05-03 MED ORDER — SODIUM CHLORIDE 0.9 % IV BOLUS
500.0000 mL | Freq: Once | INTRAVENOUS | Status: AC
  Administered 2018-05-03: 500 mL via INTRAVENOUS

## 2018-05-03 MED ORDER — VANCOMYCIN HCL IN DEXTROSE 750-5 MG/150ML-% IV SOLN
750.0000 mg | Freq: Three times a day (TID) | INTRAVENOUS | Status: DC
  Filled 2018-05-03: qty 150

## 2018-05-03 MED ORDER — ENOXAPARIN SODIUM 40 MG/0.4ML ~~LOC~~ SOLN
40.0000 mg | SUBCUTANEOUS | Status: DC
  Administered 2018-05-03 – 2018-05-04 (×2): 40 mg via SUBCUTANEOUS
  Filled 2018-05-03 (×2): qty 0.4

## 2018-05-03 MED ORDER — LISINOPRIL-HYDROCHLOROTHIAZIDE 20-25 MG PO TABS: 1.0000 | ORAL_TABLET | Freq: Every day | ORAL | Status: DC

## 2018-05-03 MED ORDER — DULOXETINE HCL 30 MG PO CPEP
60.0000 mg | ORAL_CAPSULE | Freq: Two times a day (BID) | ORAL | Status: DC
Start: 2018-05-03 — End: 2018-05-05
  Administered 2018-05-03 – 2018-05-05 (×5): 60 mg via ORAL
  Filled 2018-05-03 (×5): qty 2

## 2018-05-03 MED ORDER — NAPROXEN 250 MG PO TABS
500.0000 mg | ORAL_TABLET | Freq: Two times a day (BID) | ORAL | Status: DC
  Administered 2018-05-03 – 2018-05-05 (×5): 500 mg via ORAL
  Filled 2018-05-03 (×6): qty 2

## 2018-05-03 MED ORDER — ALPRAZOLAM 1 MG PO TABS
1.0000 mg | ORAL_TABLET | Freq: Three times a day (TID) | ORAL | Status: DC | PRN
  Administered 2018-05-03 – 2018-05-05 (×4): 1 mg via ORAL
  Filled 2018-05-03 (×4): qty 1

## 2018-05-03 MED ORDER — GUAIFENESIN ER 600 MG PO TB12
600.0000 mg | ORAL_TABLET | Freq: Two times a day (BID) | ORAL | Status: DC
  Administered 2018-05-03 – 2018-05-05 (×6): 600 mg via ORAL
  Filled 2018-05-03 (×6): qty 1

## 2018-05-03 MED ORDER — LORAZEPAM 2 MG/ML IJ SOLN
0.5000 mg | Freq: Once | INTRAMUSCULAR | Status: AC | PRN
  Administered 2018-05-03: 0.5 mg via INTRAVENOUS
  Filled 2018-05-03: qty 1

## 2018-05-03 MED ORDER — MAGNESIUM SULFATE 2 GM/50ML IV SOLN
2.0000 g | INTRAVENOUS | Status: AC
  Administered 2018-05-03: 2 g via INTRAVENOUS
  Filled 2018-05-03: qty 50

## 2018-05-03 MED ORDER — SODIUM CHLORIDE 0.9 % IV SOLN
INTRAVENOUS | Status: DC
  Administered 2018-05-03: 07:00:00 via INTRAVENOUS

## 2018-05-03 MED ORDER — METHYLPREDNISOLONE SODIUM SUCC 40 MG IJ SOLR
40.0000 mg | Freq: Three times a day (TID) | INTRAMUSCULAR | Status: DC
  Administered 2018-05-03: 40 mg via INTRAVENOUS
  Filled 2018-05-03: qty 1

## 2018-05-03 MED ORDER — IPRATROPIUM-ALBUTEROL 0.5-2.5 (3) MG/3ML IN SOLN: 3.0000 mL | RESPIRATORY_TRACT | Status: DC | PRN

## 2018-05-03 MED ORDER — IPRATROPIUM-ALBUTEROL 0.5-2.5 (3) MG/3ML IN SOLN
3.0000 mL | Freq: Four times a day (QID) | RESPIRATORY_TRACT | Status: DC
  Administered 2018-05-03 (×2): 3 mL via RESPIRATORY_TRACT
  Filled 2018-05-03 (×2): qty 3

## 2018-05-03 MED ORDER — GABAPENTIN 300 MG PO CAPS
600.0000 mg | ORAL_CAPSULE | Freq: Four times a day (QID) | ORAL | Status: DC
  Administered 2018-05-03 – 2018-05-05 (×9): 600 mg via ORAL
  Filled 2018-05-03 (×10): qty 2

## 2018-05-03 MED ORDER — ACETAMINOPHEN 325 MG PO TABS: 650.0000 mg | ORAL_TABLET | Freq: Four times a day (QID) | ORAL | Status: DC | PRN

## 2018-05-03 MED ORDER — METHYLPREDNISOLONE SODIUM SUCC 125 MG IJ SOLR
60.0000 mg | Freq: Three times a day (TID) | INTRAMUSCULAR | Status: DC
  Administered 2018-05-03: 60 mg via INTRAVENOUS
  Filled 2018-05-03: qty 2

## 2018-05-03 MED ORDER — VANCOMYCIN HCL IN DEXTROSE 1-5 GM/200ML-% IV SOLN
1000.0000 mg | Freq: Two times a day (BID) | INTRAVENOUS | Status: DC
  Administered 2018-05-03 – 2018-05-04 (×2): 1000 mg via INTRAVENOUS
  Filled 2018-05-03 (×2): qty 200

## 2018-05-03 MED ORDER — MAGNESIUM SULFATE 2 GM/50ML IV SOLN: 2.0000 g | Freq: Once | INTRAVENOUS | Status: DC

## 2018-05-03 NOTE — Evaluation (Signed)
Clinical/Bedside Swallow Evaluation Patient Details  Name: Joan [name] MRN: [id number] Date of Birth: 14-Nov-1965  Today's Date: 05/03/2018 Time: SLP Start Time (ACUTE ONLY): 1200 SLP Stop Time (ACUTE ONLY): 1210 SLP Time Calculation (min) (ACUTE ONLY): 10 min  Past Medical History:  Past Medical History:  Diagnosis Date  . Anemia   . Anxiety   . Asthma   . Chronic back pain   . Chronic neck pain   . Complex regional pain syndrome   . GERD (gastroesophageal reflux disease)   . Hematuria 09/07/2013  . Hypertension   . IBS (irritable bowel syndrome)   . Migraine   . Paresthesia of left arm    Past Surgical History:  Past Surgical History:  Procedure Laterality Date  . DILITATION & CURRETTAGE/HYSTROSCOPY WITH THERMACHOICE ABLATION N/A 08/17/2013   Procedure: DILATATION & CURETTAGE/HYSTEROSCOPY WITH THERMACHOICE ABLATION;  Surgeon: Jonnie Kind, MD;  Location: [facility];  Service: Gynecology;  Laterality: N/A;  18 ml D5W in & out; total therapy time= 9 minutes 33 seconds; temp= 87 degrees celcius  . HERNIA REPAIR     ventral hernia  . POLYPECTOMY N/A 08/17/2013   Procedure: REMOVAL ENDOMETRIAL POLYP;  Surgeon: Jonnie Kind, MD;  Location: [facility];  Service: Gynecology;  Laterality: N/A;  . TUBAL LIGATION    . ULNAR NERVE REPAIR Left    [facility] 2013   HPI:  Patient is a 52 y.o. female with PMH: asthma, HTN, tobacco abuse, chronic pain, who presented to hospital wtih 3-4 day history of progressively worsening SOB with productive cough, wheezing and centralized chest pain. CXR on 8/18 revealed diffuse bilateral airspace infiltrates in lungs. Patient was recent admit, on 6/20, with acute respiratory failure with hypoxia.   Assessment / Plan / Recommendation Clinical Impression  Patient presents with a swallow that is within normal limits. She did not exhibit any overt s/s of aspiration or penetration with thin liquids or with solids. Swallow initiation was timely and laryngeal  elevation was WNL. At baseline, patient's respiratory rate fluctuates between 30-40 and her respiratory rate did increase slightly when eating graham cracker. Patient reports having h/o dry mouth. She denied any difficulty with eating her breakfast meal and when asked, did say that she took her time while eating.  SLP Visit Diagnosis: Dysphagia, unspecified (R13.10)    Aspiration Risk  No limitations    Diet Recommendation Regular;Thin liquid   Liquid Administration via: Straw;Cup Medication Administration: Whole meds with liquid Supervision: Patient able to self feed Postural Changes: Seated upright at 90 degrees    Other  Recommendations Oral Care Recommendations: Oral care BID   Follow up Recommendations None      Frequency and Duration   N/A         Prognosis   N/A     Swallow Study   General Date of Onset: 05/02/18 HPI: Patient is a 52 y.o. female with PMH: asthma, HTN, tobacco abuse, chronic pain, who presented to hospital wtih 3-4 day history of progressively worsening SOB with productive cough, wheezing and centralized chest pain. CXR on 8/18 revealed diffuse bilateral airspace infiltrates in lungs. Patient was recent admit, on 6/20, with acute respiratory failure with hypoxia. Type of Study: Bedside Swallow Evaluation Previous Swallow Assessment: N/A Diet Prior to this Study: Regular;Thin liquids Temperature Spikes Noted: No Respiratory Status: Nasal cannula History of Recent Intubation: No Behavior/Cognition: Alert;Lethargic/Drowsy;Cooperative Oral Cavity Assessment: Within Functional Limits Oral Care Completed by SLP: No Oral Cavity - Dentition: Adequate natural dentition Vision: Functional  for self-feeding Self-Feeding Abilities: Able to feed self Patient Positioning: Upright in bed Baseline Vocal Quality: Normal Volitional Cough: Strong Volitional Swallow: Able to elicit    Oral/Motor/Sensory Function Overall Oral Motor/Sensory Function: Within functional  limits   Ice Chips     Thin Liquid Thin Liquid: Within functional limits Presentation: Straw Other Comments: No overt s/s of aspiration or penetration.    Nectar Thick     Honey Thick     Puree     Solid     Solid: Within functional limits Other Comments: No overt s/s of aspiration or penetration. Respiratory rate did increase slightly during mastication of graham cracker.      Sonia Baller, MA, CCC-SLP 05/03/18 12:22 PM

## 2018-05-03 NOTE — ED Provider Notes (Signed)
[facility] DEPT Provider Note   CSN: [id number] Arrival date & time: 05/02/18  2317    History   Chief Complaint Chief Complaint  Patient presents with  . Shortness of Breath  . Asthma    HPI Joan [name] is a 52 y.o. female.  Level 5 caveat secondary to acuity of condition.  52 year old female with a history of asthma, hypertension, complex regional pain syndrome, IBS, esophageal reflux presents to the emergency department for shortness of breath.  EMS reports that shortness of breath feels similar to prior asthma attacks, though patient states that her symptoms today feel different.  She describes a sharp pain to the left side of her chest.  She has received 2 Duonebs of 5mg /0.5mg  with EMS prior to arrival.  Also given 125mg  IV Solumedrol.  Patient reports no relief with these medications.  Had also called EMS earlier in the day, but declined transport.  Has history of prior hospitalizations and intubations for similar symptoms.  Denies recent fevers.  No syncope, vomiting.  Is a 1 pack/day smoker.     Past Medical History:  Diagnosis Date  . Anemia   . Anxiety   . Asthma   . Chronic back pain   . Chronic neck pain   . Complex regional pain syndrome   . GERD (gastroesophageal reflux disease)   . Hematuria 09/07/2013  . Hypertension   . IBS (irritable bowel syndrome)   . Migraine   . Paresthesia of left arm     Patient Active Problem List   Diagnosis Date Noted  . Acute hypoxemic respiratory failure ([facility]) 03/05/2018  . Hypokalemia 03/05/2018  . Depression 03/05/2018  . Anxiety 03/05/2018  . HTN (hypertension) 03/05/2018  . SIRS (systemic inflammatory response syndrome) ([facility])   . SOB (shortness of breath)   . Chronic pain syndrome   . CAP (community acquired pneumonia) 06/05/2016  . Acute respiratory failure with hypoxia ([facility]) 06/05/2016  . 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) 06/27/2014  . Spinal stenosis in cervical  region 06/27/2014  . Back pain 09/07/2013  . Hematuria 09/07/2013  . Endometrium, polyp 08/17/2013  . Anemia 08/13/2013    Past Surgical History:  Procedure Laterality Date  . DILITATION & CURRETTAGE/HYSTROSCOPY WITH THERMACHOICE ABLATION N/A 08/17/2013   Procedure: DILATATION & CURETTAGE/HYSTEROSCOPY WITH THERMACHOICE ABLATION;  Surgeon: Jonnie Kind, MD;  Location: [facility];  Service: Gynecology;  Laterality: N/A;  18 ml D5W in & out; total therapy time= 9 minutes 33 seconds; temp= 87 degrees celcius  . HERNIA REPAIR     ventral hernia  . POLYPECTOMY N/A 08/17/2013   Procedure: REMOVAL ENDOMETRIAL POLYP;  Surgeon: Jonnie Kind, MD;  Location: [facility];  Service: Gynecology;  Laterality: N/A;  . TUBAL LIGATION    . ULNAR NERVE REPAIR Left    [facility] 2013     OB History    Gravida  4   Para  4   Term  4   Preterm      AB      Living  4     SAB      TAB      Ectopic      Multiple      Live Births               Home Medications    Prior to Admission medications   Medication Sig Start Date End Date Taking? Authorizing Provider  ALPRAZolam Duanne Moron) 1 MG tablet Take 1 mg  by mouth 3 (three) times daily. 03/25/18  Yes [provider]  Cholecalciferol (VITAMIN D PO) Take 2 tablets by mouth daily.   Yes [provider]  divalproex (DEPAKOTE ER) 500 MG 24 hr tablet Take 500 mg by mouth daily. 03/06/18  Yes [provider]  DULoxetine (CYMBALTA) 60 MG capsule Take 1 capsule (60 mg total) by mouth daily. Patient taking differently: Take 60 mg by mouth 2 (two) times daily.  07/29/14  Yes Kirsteins, Luanna Salk, MD  gabapentin (NEURONTIN) 300 MG capsule Take 600 mg by mouth 4 (four) times daily.  05/14/16  Yes [provider]  Ipratropium-Albuterol (COMBIVENT RESPIMAT) 20-100 MCG/ACT AERS respimat Inhale 1 puff into the lungs every 6 (six) hours as needed for wheezing or shortness of breath.   Yes [provider]  LINZESS 145 MCG CAPS  capsule Take 145 mcg by mouth daily as needed (IBS constipation).  05/10/14  Yes [provider]  lisinopril-hydrochlorothiazide (PRINZIDE,ZESTORETIC) 20-25 MG per tablet Take 1 tablet by mouth daily. 05/10/14  Yes [provider]  naloxone (NARCAN) nasal spray 4 mg/0.1 mL Place 1 spray into the nose daily as needed (overdose).   Yes [provider]  naproxen (NAPROSYN) 500 MG tablet Take 500 mg by mouth 2 (two) times daily with a meal. 02/28/18  Yes [provider]  oxycodone (OXY-IR) 5 MG capsule Take 5 mg by mouth every 4 (four) hours as needed for pain.   Yes [provider]  oxyCODONE (ROXICODONE) 15 MG immediate release tablet Take 15 mg by mouth 4 (four) times daily.   Yes [provider]  promethazine (PHENERGAN) 25 MG tablet Take 25 mg by mouth every 6 (six) hours as needed for nausea or vomiting.   Yes [provider]  topiramate (TOPAMAX) 100 MG tablet Take 100 mg by mouth daily as needed (migraine).  06/06/14  Yes [provider]  VENTOLIN HFA 108 (90 Base) MCG/ACT inhaler Inhale 1 puff into the lungs every 4 (four) hours as needed for shortness of breath or wheezing. 01/11/18  Yes [provider]  zolpidem (AMBIEN) 10 MG tablet Take 10 mg by mouth at bedtime.    Yes [provider]  doxycycline (VIBRAMYCIN) 100 MG capsule Take 1 capsule (100 mg total) by mouth 2 (two) times daily. Patient not taking: Reported on 05/03/2018 03/06/18   Velvet Bathe, MD  predniSONE (DELTASONE) 50 MG tablet Take 1 tablet (50 mg total) by mouth daily. Patient not taking: Reported on 05/03/2018 03/06/18   Velvet Bathe, MD    Family History Family History  Problem Relation Age of Onset  . Diabetes Sister   . Hypertension Sister   . Heart disease Sister   . Cancer Sister        brain tumor  . Heart disease Brother   . Diabetes Brother   . Cancer Maternal Aunt        uterine  . Cancer Maternal Grandmother         uterine  . Other Mother        car accident    Social History Social History   Tobacco Use  . Smoking status: Current Every Day Smoker    Packs/day: 1.00    Years: 26.00    Pack years: 26.00    Types: Cigarettes  . Smokeless tobacco: Never Used  . Tobacco comment: 04/23/16 1/2- 3/4 PPD  Substance Use Topics  . Alcohol use: No  . Drug use: No  Allergies   Other   Review of Systems Review of Systems  Unable to perform ROS: Acuity of condition     Physical Exam Updated Vital Signs BP 138/84   Pulse (!) 107   Temp 98.8 F (37.1 C) (Oral)   Resp (!) 33   SpO2 97%   Physical Exam  Constitutional: She is oriented to person, place, and time. She appears well-developed and well-nourished. She appears distressed.  Obese, nontoxic.  HENT:  Head: Normocephalic and atraumatic.  Eyes: Conjunctivae and EOM are normal. No scleral icterus.  Neck: Normal range of motion.  Cardiovascular: Regular rhythm and intact distal pulses.  Tachycardic   Pulmonary/Chest: Accessory muscle usage present. Tachypnea noted. She is in respiratory distress. She has wheezes (faint expiratory in LLL) in the left lower field.  Moving air quite well, but tachypneic and dyspneic with accessory muscle usage. No rales or rhonchi noted.  Musculoskeletal: Normal range of motion.  Neurological: She is alert and oriented to person, place, and time. She exhibits normal muscle tone. Coordination normal.  GCS 15. Moving all extremities.  Skin: Skin is warm and dry. No rash noted. She is not diaphoretic. No erythema. No pallor.  Psychiatric: She has a normal mood and affect. Her behavior is normal.  Nursing note and vitals reviewed.    ED Treatments / Results  Labs (all labs ordered are listed, but only abnormal results are displayed) Labs Reviewed  BLOOD GAS, ARTERIAL - Abnormal; Notable for the following components:      Result Value   pO2, Arterial 57.1 (*)    All other components within normal  limits  CBC WITH DIFFERENTIAL/PLATELET - Abnormal; Notable for the following components:   WBC 11.7 (*)    Neutro Abs 10.0 (*)    All other components within normal limits  COMPREHENSIVE METABOLIC PANEL - Abnormal; Notable for the following components:   Potassium 3.3 (*)    Glucose, Bld 196 (*)    Calcium 8.8 (*)    Albumin 3.4 (*)    AST 51 (*)    All other components within normal limits  RAPID URINE DRUG SCREEN, HOSP PERFORMED - Abnormal; Notable for the following components:   Opiates   (*)    Value: Result not available. Reagent lot number recalled by manufacturer.   All other components within normal limits  I-STAT CHEM 8, ED - Abnormal; Notable for the following components:   Potassium 3.2 (*)    BUN 5 (*)    Glucose, Bld 205 (*)    All other components within normal limits  I-STAT CG4 LACTIC ACID, ED - Abnormal; Notable for the following components:   Lactic Acid, Venous 3.32 (*)    All other components within normal limits  CULTURE, BLOOD (ROUTINE X 2)  CULTURE, BLOOD (ROUTINE X 2)  BRAIN NATRIURETIC PEPTIDE  MAGNESIUM  PROTIME-INR  APTT  I-STAT TROPONIN, ED    EKG EKG Interpretation  Date/Time:  Sunday May 03 2018 00:25:33 EDT Ventricular Rate:  108 PR Interval:    QRS Duration: 73 QT Interval:  381 QTC Calculation: 511 R Axis:   50 Text Interpretation:  Sinus tachycardia Low voltage, precordial leads Borderline T abnormalities, diffuse leads Prolonged QT interval No significant change since last tracing Confirmed by Orpah Greek [phone]) on 05/03/2018 12:28:27 AM   Radiology Dg Chest 2 View  Result Date: 05/03/2018 CLINICAL DATA:  Persistent shortness of breath with probable asthma attack. No relief with treatment. Current smoker. EXAM: CHEST - 2  VIEW COMPARISON:  03/05/2018 FINDINGS: Shallow inspiration. Heart size is normal for technique. Diffuse airspace infiltrates throughout both lungs, similar to previous study. No blunting of costophrenic  angles. No pneumothorax. Mediastinal contours appear intact. IMPRESSION: Diffuse bilateral airspace infiltrates in the lungs similar to previous study. Consider multifocal pneumonia, edema, atypical infection, or ARDS. Electronically Signed   By: Lucienne Capers M.D.   On: 05/03/2018 00:08    Procedures Procedures (including critical care time)  Medications Ordered in ED Medications  vancomycin (VANCOCIN) IVPB 1000 mg/200 mL premix (1,000 mg Intravenous New Bag/Given 05/03/18 0121)  ceFEPIme (MAXIPIME) 2 g in sodium chloride 0.9 % 100 mL IVPB (2 g Intravenous New Bag/Given 05/03/18 0120)  potassium chloride 10 mEq in 100 mL IVPB (has no administration in time range)  albuterol (PROVENTIL) (2.5 MG/3ML) 0.083% nebulizer solution 5 mg (5 mg Nebulization Given 05/03/18 0004)  magnesium sulfate IVPB 2 g 50 mL (2 g Intravenous New Bag/Given 05/03/18 0028)  LORazepam (ATIVAN) injection 0.5 mg (0.5 mg Intravenous Given 05/03/18 0024)    CRITICAL CARE Performed by: Antonietta Breach   Total critical care time: 45 minutes  Critical care time was exclusive of separately billable procedures and treating other patients.  Critical care was necessary to treat or prevent imminent or life-threatening deterioration.  Critical care was time spent personally by me on the following activities: development of treatment plan with patient and/or surrogate as well as nursing, discussions with consultants, evaluation of patient's response to treatment, examination of patient, obtaining history from patient or surrogate, ordering and performing treatments and interventions, ordering and review of laboratory studies, ordering and review of radiographic studies, pulse oximetry and re-evaluation of patient's condition.   Initial Impression / Assessment and Plan / ED Course  I have reviewed the triage vital signs and the nursing notes.  Pertinent labs & imaging results that were available during my care of the patient were  reviewed by me and considered in my medical decision making (see chart for details).     52 year old female present to the emergency department for increasing shortness of breath.  She was noted to be moving air fairly well with no significant wheezing on arrival.  Was given 2 neb treatments by EMS in transport in addition to 125 mg Solu-Medrol.  Oxygen saturations 81% on room air during initial exam.  These sats improved with a nonrebreather.  Patient denies recent fevers.  She had similar presentation in June for which she was admitted.  CT angiogram was negative for pulmonary embolus at this time.  Her chest x-ray is concerning for multifocal pneumonia versus acute respiratory distress syndrome.  Given recent hospitalization, antibiotics were ordered for coverage of HCAP.  Patient ordered to be placed on BiPAP given ongoing tachypnea. She will be admitted to the SDU by the hospitalist service.   Final Clinical Impressions(s) / ED Diagnoses   Final diagnoses:  Acute respiratory failure with hypoxia [facility])    ED Discharge Orders    None       Antonietta Breach, PA-C 05/03/18 0147    Orpah Greek, MD 05/03/18 [phone]

## 2018-05-03 NOTE — Progress Notes (Signed)
CRITICAL VALUE ALERT  Critical Value:  Lactic Acid 3.0  Date & Time Notied:  Today, now  Provider Notified: [facility] on-call  Orders Received/Actions taken: pending

## 2018-05-03 NOTE — H&P (Signed)
History and Physical    Joan [name] HWE:[id number] DOB: Dec 09, 1965 DOA: 05/02/2018  Referring MD/NP/PA: Antonietta Breach, PA-C PCP: Larina Earthly, MD  Patient coming from: Via EMS  Chief Complaint: Shortness of breath  I have personally briefly reviewed patient's old medical records in [place]   HPI: Joan [name] is a 52 y.o. female with medical history significant of asthma, HTN, tobacco abuse, chronic pain, who presents with 3-4 day history of progressively worsening shortness of breath with productive cough.  Associated symptoms include wheezing and centralized chest pain.  She reports utilizing her inhalers of albuterol and Combivent without relief of symptoms.  Patient chronically has pain in her arms and legs with intermittent numbness denies any fever, nausea, vomiting, leg swelling, recent travel, sick contacts, or immunocompromising disease to her knowledge.  She does continue to smoke 1 pack of cigarettes per day on average and has no will of wanting to quit.    Patient was last admitted to the hospital from 6/20-6/21 with acute respiratory failure with hypoxia thought possibly related infection or provoked medication.  CT angiogram revealed diffuse groundglass opacities.  Patient was discharged home with breathing treatments, doxycycline, and prednisone.  She denies ever requiring intubation for during previous hospitalizations for asthma exacerbation.  En route with EMS patient was given 125 mg of Solu-Medrol and 2 DuoNeb breathing treatments.    ED Course: Patient was seen to be afebrile, heart rates 10 9-1 10, respirations up to 29, O2 saturations as low as 88% on room air, and currently on partial rebreather mask with O2 sat 96%.  Initial ABG revealed pH of 7.424, PCO2 36.4, PO2 57.1.  Labs revealed WBC 11.7, potassium 3.3, and glucose 196.  Chest x-ray revealed diffuse bilateral airspace infiltrates similar to previous admission in June.  Patient was started on empiric  antibiotics of vancomycin and cefepime.  Also receiving albuterol treatment, 20 mEq of potassium chloride, and 2 g of magnesium sulfate.  Review of Systems  Constitutional: Positive for chills, diaphoresis and malaise/fatigue. Negative for fever.  HENT: Negative for congestion and ear discharge.   Eyes: Negative for photophobia and pain.  Respiratory: Positive for shortness of breath.   Cardiovascular: Positive for chest pain. Negative for leg swelling.  Gastrointestinal: Negative for abdominal pain, diarrhea, nausea and vomiting.  Genitourinary: Negative for dysuria and hematuria.  Musculoskeletal: Positive for joint pain and myalgias.  Skin: Negative for itching and rash.  Neurological: Positive for sensory change (Chronic). Negative for loss of consciousness.  Psychiatric/Behavioral: Negative for hallucinations and memory loss.    Past Medical History:  Diagnosis Date  . Anemia   . Anxiety   . Asthma   . Chronic back pain   . Chronic neck pain   . Complex regional pain syndrome   . GERD (gastroesophageal reflux disease)   . Hematuria 09/07/2013  . Hypertension   . IBS (irritable bowel syndrome)   . Migraine   . Paresthesia of left arm     Past Surgical History:  Procedure Laterality Date  . DILITATION & CURRETTAGE/HYSTROSCOPY WITH THERMACHOICE ABLATION N/A 08/17/2013   Procedure: DILATATION & CURETTAGE/HYSTEROSCOPY WITH THERMACHOICE ABLATION;  Surgeon: Jonnie Kind, MD;  Location: [facility];  Service: Gynecology;  Laterality: N/A;  18 ml D5W in & out; total therapy time= 9 minutes 33 seconds; temp= 87 degrees celcius  . HERNIA REPAIR     ventral hernia  . POLYPECTOMY N/A 08/17/2013   Procedure: REMOVAL ENDOMETRIAL POLYP;  Surgeon: Jonnie Kind,  MD;  Location: [facility];  Service: Gynecology;  Laterality: N/A;  . TUBAL LIGATION    . ULNAR NERVE REPAIR Left    [facility] 2013     reports that she has been smoking cigarettes. She has a 26.00 pack-year smoking history. She has  never used smokeless tobacco. She reports that she does not drink alcohol or use drugs.  Allergies  Allergen Reactions  . Other Other (See Comments)    IV meds for migraine, patient stated it made her feel like something was crawling all over her body.    Family History  Problem Relation Age of Onset  . Diabetes Sister   . Hypertension Sister   . Heart disease Sister   . Cancer Sister        brain tumor  . Heart disease Brother   . Diabetes Brother   . Cancer Maternal Aunt        uterine  . Cancer Maternal Grandmother        uterine  . Other Mother        car accident    Prior to Admission medications   Medication Sig Start Date End Date Taking? Authorizing Provider  ALPRAZolam Duanne Moron) 1 MG tablet Take 1 mg by mouth 3 (three) times daily. 03/25/18  Yes [provider]  Cholecalciferol (VITAMIN D PO) Take 2 tablets by mouth daily.   Yes [provider]  divalproex (DEPAKOTE ER) 500 MG 24 hr tablet Take 500 mg by mouth daily. 03/06/18  Yes [provider]  DULoxetine (CYMBALTA) 60 MG capsule Take 1 capsule (60 mg total) by mouth daily. Patient taking differently: Take 60 mg by mouth 2 (two) times daily.  07/29/14  Yes Kirsteins, Luanna Salk, MD  gabapentin (NEURONTIN) 300 MG capsule Take 600 mg by mouth 4 (four) times daily.  05/14/16  Yes [provider]  Ipratropium-Albuterol (COMBIVENT RESPIMAT) 20-100 MCG/ACT AERS respimat Inhale 1 puff into the lungs every 6 (six) hours as needed for wheezing or shortness of breath.   Yes [provider]  LINZESS 145 MCG CAPS capsule Take 145 mcg by mouth daily as needed (IBS constipation).  05/10/14  Yes [provider]  lisinopril-hydrochlorothiazide (PRINZIDE,ZESTORETIC) 20-25 MG per tablet Take 1 tablet by mouth daily. 05/10/14  Yes [provider]  naloxone (NARCAN) nasal spray 4 mg/0.1 mL Place 1 spray into the nose daily as needed (overdose).   Yes [provider]  naproxen  (NAPROSYN) 500 MG tablet Take 500 mg by mouth 2 (two) times daily with a meal. 02/28/18  Yes [provider]  oxycodone (OXY-IR) 5 MG capsule Take 5 mg by mouth every 4 (four) hours as needed for pain.   Yes [provider]  oxyCODONE (ROXICODONE) 15 MG immediate release tablet Take 15 mg by mouth 4 (four) times daily.   Yes [provider]  promethazine (PHENERGAN) 25 MG tablet Take 25 mg by mouth every 6 (six) hours as needed for nausea or vomiting.   Yes [provider]  topiramate (TOPAMAX) 100 MG tablet Take 100 mg by mouth daily as needed (migraine).  06/06/14  Yes [provider]  VENTOLIN HFA 108 (90 Base) MCG/ACT inhaler Inhale 1 puff into the lungs every 4 (four) hours as needed for shortness of breath or wheezing. 01/11/18  Yes [provider]  zolpidem (AMBIEN) 10 MG tablet Take 10 mg by mouth at bedtime.    Yes [provider]  doxycycline (VIBRAMYCIN) 100 MG capsule Take 1  capsule (100 mg total) by mouth 2 (two) times daily. Patient not taking: Reported on 05/03/2018 03/06/18   Velvet Bathe, MD  predniSONE (DELTASONE) 50 MG tablet Take 1 tablet (50 mg total) by mouth daily. Patient not taking: Reported on 05/03/2018 03/06/18   Velvet Bathe, MD    Physical Exam:  Constitutional: Middle-aged female who appears to be in moderate discomfort complaining of pain Vitals:   05/02/18 2345 05/02/18 2347 05/03/18 0039  BP: 126/73  138/84  Pulse: (!) 109  (!) 110  Resp: 14  (!) 29  Temp: 98.8 F (37.1 C)    TempSrc: Oral    SpO2: (S) (!) 88% (S) 91% 96%   Eyes: PERRL, lids and conjunctivae normal ENMT: Mucous membranes are moist. Posterior pharynx clear of any exudate or lesions.  Neck: normal, supple, no masses, no thyromegaly Respiratory: Tachypneic with some expiratory wheezes appreciated on both lung fields.  No rhonchi rales appreciated.  Patient able to talk in short sentences.  Currently on BiPAP with less accessory  muscle usage. Cardiovascular: Tachycardic no murmurs / rubs / gallops. No extremity edema. 2+ pedal pulses. No carotid bruits.  Abdomen: no tenderness, no masses palpated. No hepatosplenomegaly. Bowel sounds positive.  Musculoskeletal: no clubbing / cyanosis.  Patient with complaints of pain with the left upper extremity. Skin: Patient diaphoretic.  No rashes, lesions, ulcers. No induration Neurologic: CN 2-12 grossly intact. Sensation intact, DTR normal. Strength 5/5 in all 4.  Psychiatric: Normal judgment and insight. Alert and oriented x 3. Normal mood.     Labs on Admission: I have personally reviewed following labs and imaging studies  CBC: Recent Labs  Lab 05/03/18 0011 05/03/18 0022  WBC 11.7*  --   NEUTROABS 10.0*  --   HGB 12.2 12.6  HCT 37.1 37.0  MCV 84.9  --   PLT 235  --    Basic Metabolic Panel: Recent Labs  Lab 05/03/18 0011 05/03/18 0022  NA 140 140  K 3.3* 3.2*  CL 106 104  CO2 23  --   GLUCOSE 196* 205*  BUN 8 5*  CREATININE 0.57 0.50  CALCIUM 8.8*  --   MG 1.7  --    GFR: CrCl cannot be calculated (Unknown ideal weight.). Liver Function Tests: Recent Labs  Lab 05/03/18 0011  AST 51*  ALT 12  ALKPHOS 46  BILITOT 0.6  PROT 6.7  ALBUMIN 3.4*   No results for input(s): LIPASE, AMYLASE in the last 168 hours. No results for input(s): AMMONIA in the last 168 hours. Coagulation Profile: No results for input(s): INR, PROTIME in the last 168 hours. Cardiac Enzymes: No results for input(s): CKTOTAL, CKMB, CKMBINDEX, TROPONINI in the last 168 hours. BNP (last 3 results) No results for input(s): PROBNP in the last 8760 hours. HbA1C: No results for input(s): HGBA1C in the last 72 hours. CBG: No results for input(s): GLUCAP in the last 168 hours. Lipid Profile: No results for input(s): CHOL, HDL, LDLCALC, TRIG, CHOLHDL, LDLDIRECT in the last 72 hours. Thyroid Function Tests: No results for input(s): TSH, T4TOTAL, FREET4, T3FREE, THYROIDAB in the  last 72 hours. Anemia Panel: No results for input(s): VITAMINB12, FOLATE, FERRITIN, TIBC, IRON, RETICCTPCT in the last 72 hours. Urine analysis:    Component Value Date/Time   COLORURINE YELLOW 06/05/2016 0051   APPEARANCEUR CLOUDY (A) 06/05/2016 0051   LABSPEC 1.016 06/05/2016 0051   PHURINE 6.0 06/05/2016 0051   GLUCOSEU NEGATIVE 06/05/2016 0051   HGBUR NEGATIVE 06/05/2016 0051   BILIRUBINUR NEGATIVE  06/05/2016 [place] 06/05/2016 0051   PROTEINUR NEGATIVE 06/05/2016 0051   UROBILINOGEN 1.0 07/31/2015 2028   NITRITE NEGATIVE 06/05/2016 0051   LEUKOCYTESUR TRACE (A) 06/05/2016 0051   Sepsis Labs: No results found for this or any previous visit (from the past 240 hour(s)).   Radiological Exams on Admission: Dg Chest 2 View  Result Date: 05/03/2018 CLINICAL DATA:  Persistent shortness of breath with probable asthma attack. No relief with treatment. Current smoker. EXAM: CHEST - 2 VIEW COMPARISON:  03/05/2018 FINDINGS: Shallow inspiration. Heart size is normal for technique. Diffuse airspace infiltrates throughout both lungs, similar to previous study. No blunting of costophrenic angles. No pneumothorax. Mediastinal contours appear intact. IMPRESSION: Diffuse bilateral airspace infiltrates in the lungs similar to previous study. Consider multifocal pneumonia, edema, atypical infection, or ARDS. Electronically Signed   By: Lucienne Capers M.D.   On: 05/03/2018 00:08    EKG: Independently reviewed.  Sinus tachycardia at 108 bpm  Assessment/Plan Respiratory failure with hypoxia, asthma exacerbation: Acute. Patient presents with worsening shortness of breath with O2 saturations 88% on room air.  Initial ABG showing PO2 of 57.1. Patient was subsequently placed on nonrebreather. - Admit to stepdown bed - Continuous pulse oximetry with oxygen as needed - BiPAP as needed - DuoNebs 4 times daily and as needed  SIRS/sepsis, diffuse bilateral pulmonary infiltrates suspected  HCAP: Acute patient initially presented tachycardic and tachypneic with initial WBC 11.7.  Lactic acid was noted to be elevated at 3.32 following multiple breathing treatments.  X-ray showing concern for multifocal pneumonia, atypical infection, and/or ARDS with similar presentation to previous hospitalization in June.  Previously noted to be negative for HIV.  Patient was started on empiric antibiotics of vancomycin and cefepime with blood cultures obtained. - Sepsis order set initiated  - Check blood cultures, sputum cultures, and respiratory virus panel - Add on procalcitonin - Continue empiric antibiotics of vancomycin and cefepime - Added on azithromycin for possible atypical coverage - Bolus at least 2 L of normal saline IV fluid - Trend lactic acid level - May want to discuss case with ID/pulmonary in a.m.  Chest pain: Acute.  Patient presents with complaints of centralized chest pain that are new and tachycardia.  D-dimer of limited utility is previously elevated with negative CT angiogram in June.   - Check troponin - CT angiogram of the chest to rule out PE  Hypokalemia: Potassium noted be 3.3 on admission.  Patient was ordered 20 mEq IV potassium chloride while in the emergency department. - Continue to monitor and replace as needed  Depression/anxiety - Continue Cymbalta and Xanax as needed for anxiety and  CRPS - Continue Depakote, gabapentin, oxycodone   Essential hypertension - Continue lisinopril-hydrochlorothiazide  Migraine headaches - Continue Topamax  Tobacco abuse: Patient reports smoking 1 pack of cigarettes per day on average with no will wanting to quit. - Recommending cessation of tobacco use   DVT prophylaxis:Lovenox Code Status: full Family Communication: Discussed plan of care with the patient family present bedside Disposition Plan: Likely discharge home in 2 to 3 days Consults called: none  Admission status: inpatient  Norval Morton MD [facility] Pager [phone]   If 7PM-7AM, please contact night-coverage [email or web address] Password [email or web address]  05/03/2018, 1:20 AM

## 2018-05-03 NOTE — Progress Notes (Signed)
Pt. Placed back on BIPAP per request of MD at this time.  Husband in room questioning why patient has to return to BIPAP.  Questions answered.

## 2018-05-03 NOTE — Progress Notes (Signed)
Pharmacy Antibiotic Note  Joan [name] is a 52 y.o. female admitted on 05/02/2018 with pneumonia.  Pharmacy has been consulted for Vancomycin, cefepime dosing.  Plan: Continue Cefepime 2gm iv q8hr Adjust Vancomycin to 1g IV q12h for estimated AUC 496 - consider stopping since MRSA PCR is negative Azithromycin per MD  Height: 5\' 5"  (165.1 cm) Weight: 210 lb 15.7 oz (95.7 kg) IBW/kg (Calculated) : 57  Temp (24hrs), Avg:98.9 F (37.2 C), Min:98.8 F (37.1 C), Max:98.9 F (37.2 C)  Recent Labs  Lab 05/03/18 0011 05/03/18 0022 05/03/18 0130 05/03/18 0337 05/03/18 0726  WBC 11.7*  --   --  11.8*  --   CREATININE 0.57 0.50  --  0.62  --   LATICACIDVEN  --   --  3.32* 3.0* 2.5*    Estimated Creatinine Clearance: 96.3 mL/min (by C-G formula based on SCr of 0.62 mg/dL).    Allergies  Allergen Reactions  . Other Other (See Comments)    IV meds for migraine, patient stated it made her feel like something was crawling all over her body.    Antimicrobials this admission:  8/18 Vancomycin >> 8/18 Cefepime >> 8/18 Azithromycin >>  Dose adjustments this admission:    Microbiology results:  8/18 BCx: sent 8/18 MRSA PCR: neg 8/18 Respiratory panel: 8/18 Urine strep Ag: 8/18 Urine legionella Ag:  Thank you for allowing pharmacy to be a part of this patient's care.  Peggyann Juba, PharmD, BCPS Pager: [phone] 05/03/2018 8:54 AM

## 2018-05-03 NOTE — Progress Notes (Signed)
CRITICAL VALUE ALERT  Critical Value:  Lactic 2.5  Date & Time Notied: 05/03/2018 0830  Provider Notified: Dr. Starla Link  Orders Received/Actions taken: No new orders

## 2018-05-03 NOTE — ED Notes (Signed)
ED TO INPATIENT HANDOFF REPORT  Name/Age/Gender Joan [name] 52 y.o. female  Code Status    Code Status Orders  (From admission, onward)         Start     Ordered   05/03/18 0148  Full code  Continuous     05/03/18 0151        Code Status History    Date Active Date Inactive Code Status Order ID Comments User Context   03/05/2018 1727 03/06/2018 1614 Full Code [id number]  Dessa Phi, DO ED   06/05/2016 0123 06/07/2016 2127 Full Code [id number]  Etta Quill, DO ED   08/13/2013 2116 08/14/2013 1323 Full Code [id number]  Jonnie Kind, MD Inpatient      Home/SNF/Other Home  Chief Complaint shortness of breath  Level of Care/Admitting Diagnosis ED Disposition    ED Disposition Condition [facility]: [facility] [[id number]]  Level of Care: Stepdown [14]  Admit to SDU based on following criteria: Respiratory Distress:  Frequent assessment and/or intervention to maintain adequate ventilation/respiration, pulmonary toilet, and respiratory treatment.  Diagnosis: Acute respiratory failure with hypoxia [facility]) [[id number]]  Admitting Physician: Norval Morton [[id number]]  Attending Physician: Norval Morton [[id number]]  Estimated length of stay: past midnight tomorrow  Certification:: I certify this patient will need inpatient services for at least 2 midnights  PT Class (Do Not Modify): Inpatient [101]  PT Acc Code (Do Not Modify): Private [1]       Medical History Past Medical History:  Diagnosis Date  . Anemia   . Anxiety   . Asthma   . Chronic back pain   . Chronic neck pain   . Complex regional pain syndrome   . GERD (gastroesophageal reflux disease)   . Hematuria 09/07/2013  . Hypertension   . IBS (irritable bowel syndrome)   . Migraine   . Paresthesia of left arm     Allergies Allergies  Allergen Reactions  . Other Other (See Comments)    IV meds for migraine, patient stated it made her feel like something was  crawling all over her body.    IV Location/Drains/Wounds Patient Lines/Drains/Airways Status   Active Line/Drains/Airways    Name:   Placement date:   Placement time:   Site:   Days:   Peripheral IV 05/03/18 Right;Medial Antecubital   05/03/18    0007    Antecubital   less than 1   Peripheral IV 05/03/18 Right Wrist   05/03/18    0106    Wrist   less than 1          Labs/Imaging Results for orders placed or performed during the hospital encounter of 05/02/18 (from the past 48 hour(s))  CBC with Differential     Status: Abnormal   Collection Time: 05/03/18 12:11 AM  Result Value Ref Range   WBC 11.7 (H) 4.0 - 10.5 K/uL   RBC 4.37 3.87 - 5.11 MIL/uL   Hemoglobin 12.2 12.0 - 15.0 g/dL   HCT 37.1 36.0 - 46.0 %   MCV 84.9 78.0 - 100.0 fL   MCH 27.9 26.0 - 34.0 pg   MCHC 32.9 30.0 - 36.0 g/dL   RDW 14.9 11.5 - 15.5 %   Platelets 235 150 - 400 K/uL   Neutrophils Relative % 85 %   Neutro Abs 10.0 (H) 1.7 - 7.7 K/uL   Lymphocytes Relative 12 %   Lymphs Abs 1.4 0.7 - 4.0 K/uL  Monocytes Relative 2 %   Monocytes Absolute 0.3 0.1 - 1.0 K/uL   Eosinophils Relative 1 %   Eosinophils Absolute 0.1 0.0 - 0.7 K/uL   Basophils Relative 0 %   Basophils Absolute 0.0 0.0 - 0.1 K/uL    Comment: Performed at [facility], [place] [address] [place], [place] [address]  Comprehensive metabolic panel     Status: Abnormal   Collection Time: 05/03/18 12:11 AM  Result Value Ref Range   Sodium 140 135 - 145 mmol/L   Potassium 3.3 (L) 3.5 - 5.1 mmol/L   Chloride 106 98 - 111 mmol/L   CO2 23 22 - 32 mmol/L   Glucose, Bld 196 (H) 70 - 99 mg/dL   BUN 8 6 - 20 mg/dL   Creatinine, Ser 0.57 0.44 - 1.00 mg/dL   Calcium 8.8 (L) 8.9 - 10.3 mg/dL   Total Protein 6.7 6.5 - 8.1 g/dL   Albumin 3.4 (L) 3.5 - 5.0 g/dL   AST 51 (H) 15 - 41 U/L   ALT 12 0 - 44 U/L   Alkaline Phosphatase 46 38 - 126 U/L   Total Bilirubin 0.6 0.3 - 1.2 mg/dL   GFR calc non Af Amer >60 >60 mL/min   GFR calc Af  Amer >60 >60 mL/min    Comment: (NOTE) The eGFR has been calculated using the CKD EPI equation. This calculation has not been validated in all clinical situations. eGFR's persistently <60 mL/min signify possible Chronic Kidney Disease.    Anion gap 11 5 - 15    Comment: Performed at [facility], [place] [address] [place], [place] [address]  Brain natriuretic peptide     Status: None   Collection Time: 05/03/18 12:11 AM  Result Value Ref Range   B Natriuretic Peptide 38.3 0.0 - 100.0 pg/mL    Comment: Performed at [facility], [place] [address], [place], [place] [address]  Magnesium     Status: None   Collection Time: 05/03/18 12:11 AM  Result Value Ref Range   Magnesium 1.7 1.7 - 2.4 mg/dL    Comment: Performed at [facility], [place] [address], [place],  [address]  Rapid urine drug screen (hospital performed)     Status: Abnormal   Collection Time: 05/03/18 12:11 AM  Result Value Ref Range   Opiates (A) NONE DETECTED    Result not available. Reagent lot number recalled by manufacturer.   Cocaine NONE DETECTED NONE DETECTED   Benzodiazepines NONE DETECTED NONE DETECTED   Amphetamines NONE DETECTED NONE DETECTED   Tetrahydrocannabinol NONE DETECTED NONE DETECTED   Barbiturates NONE DETECTED NONE DETECTED    Comment: (NOTE) DRUG SCREEN FOR MEDICAL PURPOSES ONLY.  IF CONFIRMATION IS NEEDED FOR ANY PURPOSE, NOTIFY LAB WITHIN 5 DAYS. LOWEST DETECTABLE LIMITS FOR URINE DRUG SCREEN Drug Class                     Cutoff (ng/mL) Amphetamine and metabolites    1000 Barbiturate and metabolites    200 Benzodiazepine                 711 Tricyclics and metabolites     300 Opiates and metabolites        300 Cocaine and metabolites        300 THC  50 Performed at [facility], [place] [address], [place], [place] [address]   Blood gas, arterial     Status: Abnormal   Collection Time:  05/03/18 12:15 AM  Result Value Ref Range   O2 Content 6.0 L/min   Delivery systems NASAL CANNULA    pH, Arterial 7.424 7.350 - 7.450   pCO2 arterial 36.4 32.0 - 48.0 mmHg   pO2, Arterial 57.1 (L) 83.0 - 108.0 mmHg   Bicarbonate 23.4 20.0 - 28.0 mmol/L   Acid-base deficit 0.2 0.0 - 2.0 mmol/L   O2 Saturation 88.7 %   Patient temperature 98.6    Collection site RIGHT RADIAL    Drawn by [id number]    Sample type ARTERIAL DRAW    Allens test (pass/fail) PASS PASS    Comment: Performed at [facility] [place] [address] [place], [place] [address]  I-stat troponin, ED     Status: None   Collection Time: 05/03/18 12:21 AM  Result Value Ref Range   Troponin i, poc 0.00 0.00 - 0.08 ng/mL   Comment 3            Comment: Due to the release kinetics of cTnI, a negative result within the first hours of the onset of symptoms does not rule out myocardial infarction with certainty. If myocardial infarction is still suspected, repeat the test at appropriate intervals.   I-stat chem 8, ed     Status: Abnormal   Collection Time: 05/03/18 12:22 AM  Result Value Ref Range   Sodium 140 135 - 145 mmol/L   Potassium 3.2 (L) 3.5 - 5.1 mmol/L   Chloride 104 98 - 111 mmol/L   BUN 5 (L) 6 - 20 mg/dL   Creatinine, Ser 0.50 0.44 - 1.00 mg/dL   Glucose, Bld 205 (H) 70 - 99 mg/dL   Calcium, Ion 1.21 1.15 - 1.40 mmol/L   TCO2 24 22 - 32 mmol/L   Hemoglobin 12.6 12.0 - 15.0 g/dL   HCT 37.0 36.0 - 46.0 %  I-Stat CG4 Lactic Acid, ED     Status: Abnormal   Collection Time: 05/03/18  1:30 AM  Result Value Ref Range   Lactic Acid, Venous 3.32 (HH) 0.5 - 1.9 mmol/L   Comment NOTIFIED PHYSICIAN    Dg Chest 2 View  Result Date: 05/03/2018 CLINICAL DATA:  Persistent shortness of breath with probable asthma attack. No relief with treatment. Current smoker. EXAM: CHEST - 2 VIEW COMPARISON:  03/05/2018 FINDINGS: Shallow inspiration. Heart size is normal for technique. Diffuse airspace infiltrates  throughout both lungs, similar to previous study. No blunting of costophrenic angles. No pneumothorax. Mediastinal contours appear intact. IMPRESSION: Diffuse bilateral airspace infiltrates in the lungs similar to previous study. Consider multifocal pneumonia, edema, atypical infection, or ARDS. Electronically Signed   By: Lucienne Capers M.D.   On: 05/03/2018 00:08    Pending Labs Unresulted Labs (From admission, onward)    Start     Ordered   05/03/18 0500  CBC  Tomorrow morning,   R     05/03/18 0151   05/03/18 6073  Basic metabolic panel  Tomorrow morning,   R     05/03/18 0151   05/03/18 0400  Lactic acid, plasma  STAT Now then every 3 hours,   R     05/03/18 0159   05/03/18 0206  Troponin I  Add-on,   R     05/03/18 0206   05/03/18 0158  Gram stain  Once,  R     05/03/18 0157   05/03/18 0158  Strep pneumoniae urinary antigen  Once,   R     05/03/18 0157   05/03/18 0158  Legionella Pneumophila Serogp 1 Ur Ag  Once,   R     05/03/18 0157   05/03/18 0150  Procalcitonin  Add-on,   R     05/03/18 0151   05/03/18 0113  Protime-INR  STAT,   STAT     05/03/18 0112   05/03/18 0113  APTT  STAT,   STAT     05/03/18 0112   05/03/18 0020  Blood Culture (routine x 2)  BLOOD CULTURE X 2,   STAT     05/03/18 0021          Vitals/Pain Today's Vitals   05/02/18 2347 05/03/18 0039 05/03/18 0129 05/03/18 0203  BP:  138/84  134/77  Pulse:  (!) 110 (!) 107 100  Resp:  (!) 29 (!) 33 (!) 30  Temp:      TempSrc:      SpO2: (S) 91% 96% 97% 97%    Isolation Precautions No active isolations  Medications Medications  vancomycin (VANCOCIN) IVPB 1000 mg/200 mL premix (1,000 mg Intravenous New Bag/Given 05/03/18 0121)  potassium chloride 10 mEq in 100 mL IVPB (10 mEq Intravenous New Bag/Given 05/03/18 0205)  oxyCODONE (Oxy IR/ROXICODONE) immediate release tablet 15 mg (has no administration in time range)  oxyCODONE (Oxy IR/ROXICODONE) immediate release tablet 5 mg (has no administration in  time range)  ALPRAZolam (XANAX) tablet 1 mg (has no administration in time range)  DULoxetine (CYMBALTA) DR capsule 60 mg (has no administration in time range)  lisinopril-hydrochlorothiazide (PRINZIDE,ZESTORETIC) 20-25 MG per tablet 1 tablet (has no administration in time range)  zolpidem (AMBIEN) tablet 5 mg (has no administration in time range)  naproxen (NAPROSYN) tablet 500 mg (has no administration in time range)  enoxaparin (LOVENOX) injection 40 mg (has no administration in time range)  acetaminophen (TYLENOL) tablet 650 mg (has no administration in time range)    Or  acetaminophen (TYLENOL) suppository 650 mg (has no administration in time range)  guaiFENesin (MUCINEX) 12 hr tablet 600 mg (has no administration in time range)  ipratropium-albuterol (DUONEB) 0.5-2.5 (3) MG/3ML nebulizer solution 3 mL (has no administration in time range)  sodium chloride 0.9 % bolus 2,000 mL (2,000 mLs Intravenous New Bag/Given 05/03/18 0205)  ipratropium-albuterol (DUONEB) 0.5-2.5 (3) MG/3ML nebulizer solution 3 mL (has no administration in time range)  albuterol (PROVENTIL) (2.5 MG/3ML) 0.083% nebulizer solution 5 mg (5 mg Nebulization Given 05/03/18 0004)  magnesium sulfate IVPB 2 g 50 mL (0 g Intravenous Stopped 05/03/18 0128)  LORazepam (ATIVAN) injection 0.5 mg (0.5 mg Intravenous Given 05/03/18 0024)  ceFEPIme (MAXIPIME) 2 g in sodium chloride 0.9 % 100 mL IVPB (0 g Intravenous Stopped 05/03/18 0150)    Mobility walks

## 2018-05-03 NOTE — Progress Notes (Signed)
Patient ID: Joan [name], female   DOB: [birth year], 52 y.o.   MRN: [id number] Patient was admitted early this morning for shortness of breath.  CT angiogram revealed diffuse groundglass opacities.  She was started on BiPAP along with IV antibiotics and IV steroids.  Patient seen and examined at bedside and plan of care discussed with her and family member present at bedside.  I have reviewed the patient's medical records and this morning's H&P myself.  Currently just off BiPAP.  Will start off with heart healthy diet.  On exam, patient is not wheezing that much but still is requiring significant amount of oxygen.  Will decrease Solu-Medrol to 40 mg IV every 8 hours.  I have spoken to Dr. Minna Merritts who will see the patient in consultation.  Repeat a.m. labs.  Continue broad-spectrum antibiotics for now.

## 2018-05-03 NOTE — ED Notes (Signed)
Pt is aware of urine sample needed. RT asked if she could wait until after being placed on bi-pap to use the bedpan-pt agreed.

## 2018-05-03 NOTE — Consult Note (Addendum)
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   On initial eval in 05/2016 did have assoc fever to 10.7 and did improve p rx for CAP but never back to baseline and readmit 03/05/18 with cough/sob/cp /hypoxemia while on ACEi which she has continued and rx with prednisone /doxy and better enough to return to work folding clothes at [employer] but only for a few days then readmit 8/18 with 3-4 days acute on chronic dyspnea assoc with mostly dry coughing and midline pain with cough and desats requiring bipap with no change on CTa from prior admit so PCCM consulted.  Feels worse with heat / humidity and denies IV drug use/ cocaine inhalation.    No obvious other day to day or daytime variability or assoc excess/ purulent sputum or mucus plugs or hemoptysis or   chest tightness, subjective wheeze or overt sinus or hb symptoms.    . Also denies any obvious fluctuation of symptoms with weather or environmental changes or other aggravating or alleviating factors except as outlined above   No unusual exposure hx or h/o childhood pna/ asthma or knowledge of premature birth.  Current Allergies, Complete Past Medical History, Past Surgical History, Family History, and Social History were reviewed in [employer] record.  ROS  The following are not active complaints unless bolded Hoarseness, sore throat, dysphagia, dental problems, itching, sneezing,  nasal congestion or discharge of excess mucus or purulent secretions, ear ache,   fever,  chills, sweats, unintended wt loss or wt gain, classically pleuritic or exertional cp,  orthopnea pnd or arm/hand swelling  or leg swelling, presyncope, palpitations, abdominal pain, anorexia, nausea, vomiting, diarrhea  or change in bowel habits or change in bladder habits, change in stools or change in urine, dysuria, hematuria,  rash, arthralgias, visual complaints, headache, numbness, weakness or ataxia or problems with walking or coordination,  change in mood or  memory.           PAST MEDICAL HISTORY :  She  has a past medical history of Anemia, Anxiety, Asthma, Chronic back pain, Chronic neck pain, Complex regional pain syndrome, GERD (gastroesophageal reflux disease), Hematuria (09/07/2013), Hypertension, IBS (irritable bowel syndrome), Migraine, and Paresthesia of left arm.  PAST SURGICAL HISTORY: She  has a past surgical history that includes Tubal ligation; Hernia repair; Ulnar nerve repair (Left); Dilatation & currettage/hysteroscopy with thermachoice ablation (N/A, 08/17/2013); and polypectomy (N/A, 08/17/2013).  Allergies  Allergen Reactions  . Other Other (See Comments)    IV meds for migraine, patient stated it made her feel like something was crawling all over her body.    No current facility-administered medications on file prior to encounter.    Current Outpatient Medications on File Prior to Encounter  Medication Sig  . ALPRAZolam (XANAX) 1 MG tablet Take 1 mg by mouth 3 (three) times daily.  . Cholecalciferol (VITAMIN D PO) Take 2 tablets by mouth daily.  . divalproex (DEPAKOTE ER) 500 MG 24 hr tablet Take 500 mg  by mouth daily.  . DULoxetine (CYMBALTA) 60 MG capsule Take 1 capsule (60 mg total) by mouth daily. (Patient taking differently: Take 60 mg by mouth 2 (two) times daily. )  . gabapentin (NEURONTIN) 300 MG capsule Take 600 mg by mouth 4 (four) times daily.   . Ipratropium-Albuterol (COMBIVENT RESPIMAT) 20-100 MCG/ACT AERS respimat Inhale 1 puff into the lungs every  6 (six) hours as needed for wheezing or shortness of breath.  Marland Kitchen LINZESS 145 MCG CAPS capsule Take 145 mcg by mouth daily as needed (IBS constipation).   Marland Kitchen lisinopril-hydrochlorothiazide (PRINZIDE,ZESTORETIC) 20-25 MG per tablet Take 1 tablet by mouth daily.  . naloxone (NARCAN) nasal spray 4 mg/0.1 mL Place 1 spray into the nose daily as needed (overdose).  . naproxen (NAPROSYN) 500 MG tablet Take 500 mg by mouth 2 (two) times daily with a meal.  . oxycodone (OXY-IR) 5 MG capsule Take 5 mg by mouth every 4 (four) hours as needed for pain.  Marland Kitchen oxyCODONE (ROXICODONE) 15 MG immediate release tablet Take 15 mg by mouth 4 (four) times daily.  . promethazine (PHENERGAN) 25 MG tablet Take 25 mg by mouth every 6 (six) hours as needed for nausea or vomiting.  . topiramate (TOPAMAX) 100 MG tablet Take 100 mg by mouth daily as needed (migraine).   . VENTOLIN HFA 108 (90 Base) MCG/ACT inhaler Inhale 1 puff into the lungs every 4 (four) hours as needed for shortness of breath or wheezing.  Marland Kitchen zolpidem (AMBIEN) 10 MG tablet Take 10 mg by mouth at bedtime.   Marland Kitchen doxycycline (VIBRAMYCIN) 100 MG capsule Take 1 capsule (100 mg total) by mouth 2 (two) times daily. (Patient not taking: Reported on 05/03/2018)  . predniSONE (DELTASONE) 50 MG tablet Take 1 tablet (50 mg total) by mouth daily. (Patient not taking: Reported on 05/03/2018)    FAMILY HISTORY:  Her family history includes Cancer in her maternal aunt, maternal grandmother, and sister; Diabetes in her brother and sister; Heart disease in her brother and sister; Hypertension in her sister; Other in her mother.  SOCIAL HISTORY: She  reports that she has been smoking cigarettes. She has a 26.00 pack-year smoking history. She has never used smokeless tobacco. She reports that she does not drink alcohol or use drugs.     SUBJECTIVE:  Denies cough at present, some resting sob and hoarseness     VITAL SIGNS: BP (!) 131/57   Pulse 96   Temp 97.8 F (36.6 C)  (Axillary)   Resp 18   Ht '5\' 5"'  (1.651 m)   Wt 95.7 kg   SpO2 91%   BMI 35.11 kg/m   On 10lpm NP   HEMODYNAMICS:    VENTILATOR SETTINGS:    INTAKE / OUTPUT: I/O last 3 completed shifts: In: 810.6 [I.V.:100; IV Piggyback:710.6] Out: 600 [Urine:600]  PHYSICAL EXAMINATION: General:  Mildly acutely ill appearing bf nad Pt alert, approp   No jvd Oropharynx clear Neck supple/ no palp nodes  Lungs with a prominent insp/exp rhonchi and pseudowheezing over the upper airway  RRR no s3 or or sign murmur Abd obese with excursion nl supine Extr wam with no edema or clubbing noted Neuro  No motor deficits / nl sensorium   LABS:  BMET Recent Labs  Lab 05/03/18 0011 05/03/18 0022 05/03/18 0337  NA 140 140 141  K 3.3* 3.2* 3.5  CL 106 104 106  CO2 23  --  24  BUN 8 5* 7  CREATININE 0.57 0.50 0.62  GLUCOSE  196* 205* 187*    Electrolytes Recent Labs  Lab 05/03/18 0011 05/03/18 0337  CALCIUM 8.8* 8.5*  MG 1.7  --     CBC Recent Labs  Lab 05/03/18 0011 05/03/18 0022 05/03/18 0337  WBC 11.7*  --  11.8*  HGB 12.2 12.6 12.6  HCT 37.1 37.0 38.4  PLT 235  --  235    Coag's Recent Labs  Lab 05/03/18 0337  APTT 31  INR 1.16    Sepsis Markers Recent Labs  Lab 05/03/18 0130 05/03/18 0337 05/03/18 0726  LATICACIDVEN 3.32* 3.0* 2.5*  PROCALCITON  --  0.13  --     ABG Recent Labs  Lab 05/03/18 0015  PHART 7.424  PCO2ART 36.4  PO2ART 57.1*    Liver Enzymes Recent Labs  Lab 05/03/18 0011  AST 51*  ALT 12  ALKPHOS 46  BILITOT 0.6  ALBUMIN 3.4*    Cardiac Enzymes Recent Labs  Lab 05/03/18 0337  TROPONINI <0.03    Glucose No results for input(s): GLUCAP in the last 168 hours.  Imaging Dg Chest 2 View  Result Date: 05/03/2018 CLINICAL DATA:  Persistent shortness of breath with probable asthma attack. No relief with treatment. Current smoker. EXAM: CHEST - 2 VIEW COMPARISON:  03/05/2018 FINDINGS: Shallow inspiration. Heart size is normal  for technique. Diffuse airspace infiltrates throughout both lungs, similar to previous study. No blunting of costophrenic angles. No pneumothorax. Mediastinal contours appear intact. IMPRESSION: Diffuse bilateral airspace infiltrates in the lungs similar to previous study. Consider multifocal pneumonia, edema, atypical infection, or ARDS. Electronically Signed   By: Lucienne Capers M.D.   On: 05/03/2018 00:08   Ct Angio Chest Pe W Or Wo Contrast  Result Date: 05/03/2018 CLINICAL DATA:  History of asthma and hypertension. Shortness of breath. Left-sided chest pain. Positive D-dimer. EXAM: CT ANGIOGRAPHY CHEST WITH CONTRAST TECHNIQUE: Multidetector CT imaging of the chest was performed using the standard protocol during bolus administration of intravenous contrast. Multiplanar CT image reconstructions and MIPs were obtained to evaluate the vascular anatomy. CONTRAST:  191m ISOVUE-370 IOPAMIDOL (ISOVUE-370) INJECTION 76% COMPARISON:  03/05/2018 FINDINGS: Cardiovascular: Motion artifact limits examination. Moderately good opacification of the central and segmental pulmonary arteries. No focal filling defects. No evidence of significant pulmonary embolus. Normal caliber thoracic aorta. No aortic dissection. Great vessel origins are patent. Normal heart size. No pericardial effusions. Mediastinum/Nodes: Esophagus is decompressed. Mild to moderate lymphadenopathy throughout the mediastinum and hilar regions. Largest right pretracheal lymph node measures about 17 mm diameter. Appearance is similar to previous study. Likely reactive or inflammatory nodes. Small esophageal hiatal hernia. Esophagus is decompressed. Thyroid gland is unremarkable. Lungs/Pleura: Motion artifact limits examination. Diffuse bilateral airspace disease throughout both lungs. Pattern appearance are similar to previous study. Differential diagnosis would include multifocal pneumonia, edema, ARDS, cryptogenic organizing pneumonia, hypersensitivity  reaction, or atypical infectious process. No pleural effusions. No pneumothorax. Upper Abdomen: No acute abnormalities identified. Musculoskeletal: No chest wall abnormality. No acute or significant osseous findings. Review of the MIP images confirms the above findings. IMPRESSION: 1. No evidence of significant pulmonary embolus. 2. Diffuse bilateral airspace disease throughout both lungs, similar to previous study. Electronically Signed   By: WLucienne CapersM.D.   On: 05/03/2018 04:47     STUDIES:  HIV  03/05/18 > neg  UDS 05/03/18 > pos benzo, opiates, neg cocaine D/c ACEi (last dose am 05/03/18) HSP serology 05/03/18 Collagen Vasc Dz panel 05/03/18   CULTURES: Resp panel 05/03/17 >>> MRSA  05/03/18 > neg  BC  x 2  8/18 >>> Sputum gm stain  05/03/18 >>>  ANTIBIOTICS: zmax  ([facility])  8/18  >>> vanc ([facility])  8/18 >>> Maxepime ([facility]) 8/18 >>>   SIGNIFICANT EVENTS:    LINES/TUBES:      ASSESSMENT / PLAN:   1) diffuse pulmonary infiltrates don't appear to have cleared clinically or radiographically since 05/2016 ddx Miscellaneous:Alv microlithiasis, alv proteinosis ?needs fob  Asp> ruled out by ST 8/18 but at risk due to chronic narc use bronchiectais, BOOP  >  Check ESR  ARDS/ AIP - chronicity all wrong if hx is accurate  Occupational dz> works at [employer] so I believe this is low risk   HSP> send serology Neoplasm>  Smoker, hard to rule out BAC in this setting  Infection esp HIV related > hiv neg 03/05/18,  procalcitonin nl 05/03/18 but ok to continue max/vanc for today - consider d/c 8/19 if cultures neg  Drug > denies cocaine, macodantin exp Pulmonary emboli, Protein disorders Edema - bnp <<100 on admit rules against  Eosinophilic dz> eos quite low, neg response to prednisone apparenlty Sarcoidosis> might explain some of the chronic finding but would be rare to keep presently acutely unless it was related to pred w/d   Connective tissue dz > send profile  Hist X  = Eosinophilic  granulomatosis = langerhan's Histiocytosis and DIP RBILD all in ddx as she is active smoker  Hemorrhage Idiopathic  - no clubbing or typical CT findings so doubt uip but needs serial f/u our PF clinic to be sure this isn't acute episode/flare of IPF   2)  AB vs pseudowheeze related to GERD/ ACEi or both - rec off ACEi, on max rx for gerd/ AB  3) acute resp failure with hypoxemia secondary to  1) and 2)  - presently sats on ok NP > use bipap prn    4) HBP/ possible ACEi side effects  - In the best review of chronic cough to date ( NEJM 2016 375 [id number]) ,  ACEi are now felt to cause cough in up to  20% of pts which is a 4 fold increase from previous reports and does not include the variety of non-specific complaints we see in pulmonary clinic in pts on ACEi but previously attributed to another dx like  Copd/asthma and  include PNDS, throat and chest congestion, recurrent "bronchitis" in pt with underly ILD which would not usually be assoc with "bronchitis" , unexplained dyspnea and noct "strangling" sensations, and hoarseness, but also  atypical /refractory GERD symptoms like dysphagia and "bad heartburn"   The only way I know  to prove this is not an "ACEi Case" is a trial off ACEi x a minimum of 6 weeks then regroup.  rec use clonidine 0.1 bid here     Christinia Gully, MD [facility] [place] [phone] After 5:30 PM or weekends, use [facility] [phone]

## 2018-05-03 NOTE — Progress Notes (Signed)
Pt currently on 10 LPM [place] and tolerating well at this time.  Pt on no distress and is resting comfortably, BIPAP not indicated at this time.  RT to monitor and assess as needed.

## 2018-05-03 NOTE — Progress Notes (Signed)
A consult was received from an ED physician for Vancomycin, cefepime per pharmacy dosing.  The patient's profile has been reviewed for ht/wt/allergies/indication/available labs.   A one time order has been placed for Vancomycin 1gm iv x1 and Cefepime 2gm iv x1.  Further antibiotics/pharmacy consults should be ordered by admitting physician if indicated.                       Thank you, Nani Skillern Crowford 05/03/2018  1:01 AM

## 2018-05-03 NOTE — Progress Notes (Signed)
Pharmacy Antibiotic Note  Joan [name] is a 52 y.o. female admitted on 05/02/2018 with pneumonia.  Pharmacy has been consulted for Vancomycin, cefepime dosing.  Plan: Cefepime 2gm iv q8hr  Vancomycin 1gm iv x1, then 750mg  iv q8hr  Goal AUC = 400 - 500 for all indications, except meningitis (goal AUC > 500 and Cmin 15-20 mcg/mL)   Height: 5\' 5"  (165.1 cm) Weight: 210 lb 15.7 oz (95.7 kg) IBW/kg (Calculated) : 57  Temp (24hrs), Avg:98.9 F (37.2 C), Min:98.8 F (37.1 C), Max:98.9 F (37.2 C)  Recent Labs  Lab 05/03/18 0011 05/03/18 0022 05/03/18 0130  WBC 11.7*  --   --   CREATININE 0.57 0.50  --   LATICACIDVEN  --   --  3.32*    Estimated Creatinine Clearance: 96.3 mL/min (by C-G formula based on SCr of 0.5 mg/dL).    Allergies  Allergen Reactions  . Other Other (See Comments)    IV meds for migraine, patient stated it made her feel like something was crawling all over her body.    Antimicrobials this admission: Vancomycin 05/03/2018 >> Cefepime 05/03/2018 >>  Azithromycin 05/03/2018 >>  Dose adjustments this admission: -  Microbiology results: -  Thank you for allowing pharmacy to be a part of this patient's care.  Nani Skillern Crowford 05/03/2018 3:20 AM

## 2018-05-04 LAB — CBC WITH DIFFERENTIAL/PLATELET
Basophils Absolute: 0 10*3/uL (ref 0.0–0.1)
Basophils Relative: 0 %
Eosinophils Absolute: 0 10*3/uL (ref 0.0–0.7)
Eosinophils Relative: 0 %
HCT: 37.6 % (ref 36.0–46.0)
Hemoglobin: 12 g/dL (ref 12.0–15.0)
LYMPHS ABS: 1 10*3/uL (ref 0.7–4.0)
LYMPHS PCT: 5 %
MCH: 27.5 pg (ref 26.0–34.0)
MCHC: 31.9 g/dL (ref 30.0–36.0)
MCV: 86.2 fL (ref 78.0–100.0)
MONO ABS: 0.2 10*3/uL (ref 0.1–1.0)
MONOS PCT: 1 %
NEUTROS ABS: 17.2 10*3/uL — AB (ref 1.7–7.7)
Neutrophils Relative %: 94 %
Platelets: 255 10*3/uL (ref 150–400)
RBC: 4.36 MIL/uL (ref 3.87–5.11)
RDW: 15.5 % (ref 11.5–15.5)
WBC: 18.4 10*3/uL — ABNORMAL HIGH (ref 4.0–10.5)

## 2018-05-04 LAB — RESPIRATORY PANEL BY PCR
Adenovirus: NOT DETECTED
BORDETELLA PERTUSSIS-RVPCR: NOT DETECTED
CHLAMYDOPHILA PNEUMONIAE-RVPPCR: NOT DETECTED
CORONAVIRUS 229E-RVPPCR: NOT DETECTED
CORONAVIRUS OC43-RVPPCR: NOT DETECTED
Coronavirus HKU1: NOT DETECTED
Coronavirus NL63: NOT DETECTED
INFLUENZA A-RVPPCR: NOT DETECTED
INFLUENZA B-RVPPCR: NOT DETECTED
MYCOPLASMA PNEUMONIAE-RVPPCR: NOT DETECTED
Metapneumovirus: NOT DETECTED
PARAINFLUENZA VIRUS 1-RVPPCR: NOT DETECTED
PARAINFLUENZA VIRUS 4-RVPPCR: NOT DETECTED
Parainfluenza Virus 2: NOT DETECTED
Parainfluenza Virus 3: NOT DETECTED
RESPIRATORY SYNCYTIAL VIRUS-RVPPCR: NOT DETECTED
Rhinovirus / Enterovirus: NOT DETECTED

## 2018-05-04 LAB — COMPREHENSIVE METABOLIC PANEL
ALT: 10 U/L (ref 0–44)
AST: 32 U/L (ref 15–41)
Albumin: 3.1 g/dL — ABNORMAL LOW (ref 3.5–5.0)
Alkaline Phosphatase: 50 U/L (ref 38–126)
Anion gap: 9 (ref 5–15)
BILIRUBIN TOTAL: 0.7 mg/dL (ref 0.3–1.2)
BUN: 12 mg/dL (ref 6–20)
CO2: 24 mmol/L (ref 22–32)
Calcium: 8.8 mg/dL — ABNORMAL LOW (ref 8.9–10.3)
Chloride: 107 mmol/L (ref 98–111)
Creatinine, Ser: 0.63 mg/dL (ref 0.44–1.00)
GFR calc Af Amer: >60 mL/min (ref >60)
Glucose, Bld: 178 mg/dL — ABNORMAL HIGH (ref 70–99)
POTASSIUM: 3.8 mmol/L (ref 3.5–5.1)
Sodium: 140 mmol/L (ref 135–145)
TOTAL PROTEIN: 6.4 g/dL — AB (ref 6.5–8.1)

## 2018-05-04 LAB — MAGNESIUM: MAGNESIUM: 2.1 mg/dL (ref 1.7–2.4)

## 2018-05-04 MED ORDER — NICOTINE 21 MG/24HR TD PT24
21.0000 mg | MEDICATED_PATCH | Freq: Every day | TRANSDERMAL | Status: DC
  Administered 2018-05-04: 21 mg via TRANSDERMAL
  Filled 2018-05-04: qty 1

## 2018-05-04 MED ORDER — ZOLPIDEM TARTRATE 5 MG PO TABS
5.0000 mg | ORAL_TABLET | Freq: Every evening | ORAL | Status: DC | PRN
  Administered 2018-05-04: 5 mg via ORAL
  Filled 2018-05-04: qty 1

## 2018-05-04 MED ORDER — SENNOSIDES-DOCUSATE SODIUM 8.6-50 MG PO TABS
1.0000 | ORAL_TABLET | Freq: Two times a day (BID) | ORAL | Status: DC
  Filled 2018-05-04: qty 1

## 2018-05-04 NOTE — Progress Notes (Signed)
BiPAP remains at bedside for PRN use, pt. currently comfortable on HFNC at 5 lpm-humidity refilled, refusing nebulizers, RT to monitor.

## 2018-05-04 NOTE — Progress Notes (Signed)
Plan for bronchoscopy on Friday 8/23 at 9:00 AM.  Please ensure patient is NPO after MN on 8/23.   Noe Gens, NP-C [facility] Pgr: [phone] or if no answer [phone] 05/04/2018, 2:42 PM

## 2018-05-04 NOTE — Progress Notes (Signed)
Gave pt her prn Xanax since pt remained tearful and upset.  Pt started she was very stressed out and wanted to take her medication.  When this RN went to reeval the medication, pt was fast asleep and had taken her oxygen off her nose.  This RN placed back in her nares and pt did not awaken.  When this RN went back in to give pt her scheduled pain med and Gabapentin pt remained very lethargic and would not keep her eyes open to be able to safely swallow medication.  Will continue to monitor pt.

## 2018-05-04 NOTE — Progress Notes (Signed)
Patient ID: Joan [name], female   DOB: [birth year], 52 y.o.   MRN: [id number]  PROGRESS NOTE    Joan [name]  NAT:[id number] DOB: [birth year] DOA: 05/02/2018 PCP: Larina Earthly, MD   Brief Narrative:  52 year old female with history of asthma, hypertension, tobacco abuse, chronic pain presented with shortness of breath.  CT angiogram revealed diffuse groundglass opacities.  Patient was admitted with acute hypoxic respite failure, started on antibiotics and intravenous steroids and initially required BiPAP.  Pulmonary was consulted.  Assessment & Plan:   Principal Problem:   Acute respiratory failure with hypoxia (HCC) Active Problems:   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)   SIRS (systemic inflammatory response syndrome) (HCC)   Hypokalemia   Depression   Anxiety   Asthma, chronic, unspecified asthma severity, with acute exacerbation   Chest pain   Tobacco abuse   Pulmonary infiltrate   Acute hypoxic respiratory failure secondary to pneumonia versus asthma exacerbation  -Initially required BiPAP.  Was on high flow nasal cannula oxygen yesterday.  Wean off as able.  Pulmonary following  Healthcare associated pneumonia -With the concern for gram-negative rod pneumonia/MRSA pneumonia.  Pulmonary following.  Nasal MRSA is negative so far.  Might discontinue vancomycin  later today.  Continue cefepime and Zithromax -Her pulmonary infiltrates have not cleared clinically apparently since 05/2016 as per pulmonary and he is infiltrates might not be totally bacterial in nature and can have various possibilities.  Patient might need bronchoscopy at some point as per pulmonary. -Follow cultures.  Asthma exacerbation -Currently on high-dose intravenous Solu-Medrol as per pulmonary.  Continue duo nebs  Sepsis -Secondary to above.  Antibiotic plan as above.  Follow cultures.  Discontinue IV fluids.  Hemodynamically stable currently  Chest pain -Probably secondary to hypoxia and  pneumonia.  Troponin negative.  CT angio was negative for PE.  Currently no chest pain  Complex regional pain syndrome -Continue Depakote, gabapentin, oxycodone  Essential hypertension -Continue hydrochlorothiazide.  Lisinopril was changed to clonidine by pulmonary to see if her symptoms are related to allergy to lisinopril.  Monitor blood pressure  Migraine headaches -Continue Topamax  Depression -Continue Cymbalta  Tobacco abuse -Counseled about tobacco cessation.   DVT prophylaxis: Lovenox Code Status: Full Family Communication: Spoke to husband at bedside Disposition Plan: Depends on clinical outcome  Consultants: Pulmonary  Procedures: None  Antimicrobials: Cefepime vancomycin and Zithromax from 05/02/2018 onwards   Subjective: Patient seen and examined at bedside.  She feels better and wants to go home today.  She still complains of shortness of breath.  She is almost absolutely adamant that she has to leave the hospital today and I explained that she might have to sign out [place].  Objective: Vitals:   05/04/18 0400 05/04/18 0700 05/04/18 0705 05/04/18 0800  BP:  140/74  (!) 155/87  Pulse:  82  73  Resp:  (!) 21  20  Temp: 98.2 F (36.8 C)  98.3 F (36.8 C)   TempSrc: Oral  Oral   SpO2:  (!) 87%  95%  Weight:      Height:        Intake/Output Summary (Last 24 hours) at 05/04/2018 1158 Last data filed at 05/04/2018 0730 Gross per 24 hour  Intake 1480 ml  Output 1550 ml  Net -70 ml   Filed Weights   05/03/18 0300  Weight: 95.7 kg    Examination:  General exam: Appears older than stated age.  No acute distress Respiratory system: Bilateral decreased  breath sounds at bases with scattered crackles, no wheezing Cardiovascular system: S1 & S2 heard, Rate controlled Gastrointestinal system: Abdomen is nondistended, soft and nontender. Normal bowel sounds heard. Extremities: No cyanosis, clubbing, edema  Central nervous system: Alert and  oriented. No focal neurological deficits. Moving extremities Skin: No rashes, lesions or ulcers Psychiatry: Looks anxious    Data Reviewed: I have personally reviewed following labs and imaging studies  CBC: Recent Labs  Lab 05/03/18 0011 05/03/18 0022 05/03/18 0337 05/04/18 0339  WBC 11.7*  --  11.8* 18.4*  NEUTROABS 10.0*  --   --  17.2*  HGB 12.2 12.6 12.6 12.0  HCT 37.1 37.0 38.4 37.6  MCV 84.9  --  85.5 86.2  PLT 235  --  235 448   Basic Metabolic Panel: Recent Labs  Lab 05/03/18 0011 05/03/18 0022 05/03/18 0337 05/04/18 0339  NA 140 140 141 140  K 3.3* 3.2* 3.5 3.8  CL 106 104 106 107  CO2 23  --  24 24  GLUCOSE 196* 205* 187* 178*  BUN 8 5* 7 12  CREATININE 0.57 0.50 0.62 0.63  CALCIUM 8.8*  --  8.5* 8.8*  MG 1.7  --   --  2.1   GFR: Estimated Creatinine Clearance: 96.3 mL/min (by C-G formula based on SCr of 0.63 mg/dL). Liver Function Tests: Recent Labs  Lab 05/03/18 0011 05/04/18 0339  AST 51* 32  ALT 12 10  ALKPHOS 46 50  BILITOT 0.6 0.7  PROT 6.7 6.4*  ALBUMIN 3.4* 3.1*   No results for input(s): LIPASE, AMYLASE in the last 168 hours. No results for input(s): AMMONIA in the last 168 hours. Coagulation Profile: Recent Labs  Lab 05/03/18 0337  INR 1.16   Cardiac Enzymes: Recent Labs  Lab 05/03/18 0337  TROPONINI <0.03   BNP (last 3 results) No results for input(s): PROBNP in the last 8760 hours. HbA1C: No results for input(s): HGBA1C in the last 72 hours. CBG: No results for input(s): GLUCAP in the last 168 hours. Lipid Profile: No results for input(s): CHOL, HDL, LDLCALC, TRIG, CHOLHDL, LDLDIRECT in the last 72 hours. Thyroid Function Tests: No results for input(s): TSH, T4TOTAL, FREET4, T3FREE, THYROIDAB in the last 72 hours. Anemia Panel: No results for input(s): VITAMINB12, FOLATE, FERRITIN, TIBC, IRON, RETICCTPCT in the last 72 hours. Sepsis Labs: Recent Labs  Lab 05/03/18 0130 05/03/18 0337 05/03/18 0726  PROCALCITON   --  0.13  --   LATICACIDVEN 3.32* 3.0* 2.5*    Recent Results (from the past 240 hour(s))  MRSA PCR Screening     Status: None   Collection Time: 05/03/18  2:59 AM  Result Value Ref Range Status   MRSA by PCR NEGATIVE NEGATIVE Final    Comment:        The GeneXpert MRSA Assay (FDA approved for NASAL specimens only), is one component of a comprehensive MRSA colonization surveillance program. It is not intended to diagnose MRSA infection nor to guide or monitor treatment for MRSA infections. Performed at [facility], [place] [address] [place], [place] [address]   Respiratory Panel by PCR     Status: None   Collection Time: 05/03/18  7:24 AM  Result Value Ref Range Status   Adenovirus NOT DETECTED NOT DETECTED Final   Coronavirus 229E NOT DETECTED NOT DETECTED Final   Coronavirus HKU1 NOT DETECTED NOT DETECTED Final   Coronavirus NL63 NOT DETECTED NOT DETECTED Final   Coronavirus OC43 NOT DETECTED NOT DETECTED Final  Metapneumovirus NOT DETECTED NOT DETECTED Final   Rhinovirus / Enterovirus NOT DETECTED NOT DETECTED Final   Influenza A NOT DETECTED NOT DETECTED Final   Influenza B NOT DETECTED NOT DETECTED Final   Parainfluenza Virus 1 NOT DETECTED NOT DETECTED Final   Parainfluenza Virus 2 NOT DETECTED NOT DETECTED Final   Parainfluenza Virus 3 NOT DETECTED NOT DETECTED Final   Parainfluenza Virus 4 NOT DETECTED NOT DETECTED Final   Respiratory Syncytial Virus NOT DETECTED NOT DETECTED Final   Bordetella pertussis NOT DETECTED NOT DETECTED Final   Chlamydophila pneumoniae NOT DETECTED NOT DETECTED Final   Mycoplasma pneumoniae NOT DETECTED NOT DETECTED Final    Comment: Performed at [facility], [place] [address] [place], [place] [address]         Radiology Studies: Dg Chest 2 View  Result Date: 05/03/2018 CLINICAL DATA:  Persistent shortness of breath with probable asthma attack. No relief with treatment. Current smoker. EXAM: CHEST - 2 VIEW  COMPARISON:  03/05/2018 FINDINGS: Shallow inspiration. Heart size is normal for technique. Diffuse airspace infiltrates throughout both lungs, similar to previous study. No blunting of costophrenic angles. No pneumothorax. Mediastinal contours appear intact. IMPRESSION: Diffuse bilateral airspace infiltrates in the lungs similar to previous study. Consider multifocal pneumonia, edema, atypical infection, or ARDS. Electronically Signed   By: Lucienne Capers M.D.   On: 05/03/2018 00:08   Ct Angio Chest Pe W Or Wo Contrast  Result Date: 05/03/2018 CLINICAL DATA:  History of asthma and hypertension. Shortness of breath. Left-sided chest pain. Positive D-dimer. EXAM: CT ANGIOGRAPHY CHEST WITH CONTRAST TECHNIQUE: Multidetector CT imaging of the chest was performed using the standard protocol during bolus administration of intravenous contrast. Multiplanar CT image reconstructions and MIPs were obtained to evaluate the vascular anatomy. CONTRAST:  130mL ISOVUE-370 IOPAMIDOL (ISOVUE-370) INJECTION 76% COMPARISON:  03/05/2018 FINDINGS: Cardiovascular: Motion artifact limits examination. Moderately good opacification of the central and segmental pulmonary arteries. No focal filling defects. No evidence of significant pulmonary embolus. Normal caliber thoracic aorta. No aortic dissection. Great vessel origins are patent. Normal heart size. No pericardial effusions. Mediastinum/Nodes: Esophagus is decompressed. Mild to moderate lymphadenopathy throughout the mediastinum and hilar regions. Largest right pretracheal lymph node measures about 17 mm diameter. Appearance is similar to previous study. Likely reactive or inflammatory nodes. Small esophageal hiatal hernia. Esophagus is decompressed. Thyroid gland is unremarkable. Lungs/Pleura: Motion artifact limits examination. Diffuse bilateral airspace disease throughout both lungs. Pattern appearance are similar to previous study. Differential diagnosis would include  multifocal pneumonia, edema, ARDS, cryptogenic organizing pneumonia, hypersensitivity reaction, or atypical infectious process. No pleural effusions. No pneumothorax. Upper Abdomen: No acute abnormalities identified. Musculoskeletal: No chest wall abnormality. No acute or significant osseous findings. Review of the MIP images confirms the above findings. IMPRESSION: 1. No evidence of significant pulmonary embolus. 2. Diffuse bilateral airspace disease throughout both lungs, similar to previous study. Electronically Signed   By: Lucienne Capers M.D.   On: 05/03/2018 04:47        Scheduled Meds: . cloNIDine  0.1 mg Oral BID  . divalproex  500 mg Oral Daily  . DULoxetine  60 mg Oral BID  . enoxaparin (LOVENOX) injection  40 mg Subcutaneous Q24H  . gabapentin  600 mg Oral QID  . guaiFENesin  600 mg Oral BID  . hydrochlorothiazide  25 mg Oral Daily  . ipratropium-albuterol  3 mL Nebulization TID  . methylPREDNISolone (SOLU-MEDROL) injection  80 mg Intravenous Q8H  . naproxen  500 mg Oral BID WC  .  oxyCODONE  15 mg Oral Q6H  . pantoprazole  40 mg Oral BID AC  . senna-docusate  1 tablet Oral BID  . zolpidem  5 mg Oral QHS   Continuous Infusions: . sodium chloride Stopped (05/04/18 1019)  . azithromycin Stopped (05/03/18 2204)  . ceFEPime (MAXIPIME) IV Stopped (05/04/18 0629)  . vancomycin Stopped (05/04/18 0240)     LOS: 1 day        Aline August, MD [facility] Pager [phone]  If 7PM-7AM, please contact night-coverage [email or web address] Password [facility] 05/04/2018, 11:58 AM

## 2018-05-04 NOTE — Care Management Note (Signed)
Case Management Note  Patient Details  Name: Joan [name] MRN: [id number] Date of Birth: [birth year]  Subjective/Objective:                  resp distress/required hfnc on admission weaned to o2 by [place] now on r.a. Sat at 95% /wbc 18.4  Action/Plan: Iv ns at 100cc/hrs/iv zithromax,maxipime and vancomycin/ Following for progression and cm needs.  Expected Discharge Date:  05/08/18               Expected Discharge Plan:     In-House Referral:     Discharge planning Services     Post Acute Care Choice:    Choice offered to:     DME Arranged:    DME Agency:     HH Arranged:    HH Agency:     Status of Service:     If discussed at [employer], dates discussed:    Additional Comments:  Leeroy Cha, RN 05/04/2018, 10:52 AM

## 2018-05-04 NOTE — Progress Notes (Signed)
Concerns about the patient 76 year son being at home alone and without food? CSW met patient and spouse at bedside. She reports she attempted to call him earlier that morning but could not reach him.CSW observed patient was upset and very tearful upon arrival. CSW inquired about patient tearfulness. Patient reports she is concern about her "food stamps not going through and not having money to purchase food for her sons." Patient reports she missed the deadline to submit paperwork last Friday requested by Department of Social Services.  CSW inquired about patient 54 year old son whereabouts. Patient and her spouse stated the 30 year old was at home with his older siblings 60, and 38. While CSW was in the room, the 25 year old son faced time in and stated he was sleep and did not hear the phone. Patient inquired if the patient had cereal or ate chicken strips? He replied, Yes.  Patient spouse stated he planned to go home later to check on 56, 39, 27 year old sons.  CSW provided emotional support as patient was upset about not being able to leave hospital to "take care of business." Patient reports she would miss her son open house today.  Patient states she will continue to call DSS for updates on the Food stamps.  CSW offered to give additional resources for food services in the area. Patient states she will inform the CSW if needed.   Kathrin Greathouse, Marlinda Mike, MSW Clinical Social Worker  [phone]

## 2018-05-04 NOTE — Progress Notes (Signed)
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  On initial eval in 05/2016 did have assoc fever to 10.7 and did improve p rx for CAP but never back to baseline and readmit 03/05/18 with cough/sob/cp /hypoxemia while on ACEi which she has continued and rx with prednisone /doxy and better enough to return to work folding clothes at [employer] but only for a few days then readmit 8/18 with 3-4 days acute on chronic dyspnea assoc with mostly dry coughing and midline pain with cough and desats requiring bipap with no change on CTa from prior admit so PCCM consulted.  Feels worse with heat / humidity and denies IV drug use/ cocaine inhalation.    No obvious other day to day or daytime variability or assoc excess/ purulent sputum or mucus plugs or hemoptysis or   chest tightness, subjective wheeze or overt sinus or hb symptoms.    . Also denies any obvious fluctuation of symptoms with weather or environmental changes or other aggravating or alleviating factors except as outlined above   No unusual exposure hx or h/o childhood pna/ asthma or knowledge of premature birth.   Past Medical History:  Diagnosis Date  . Anemia   . Anxiety   . Asthma   . Chronic back pain   . Chronic neck pain   . Complex regional pain syndrome   . GERD (gastroesophageal reflux disease)   . Hematuria 09/07/2013  . Hypertension   . IBS (irritable bowel syndrome)   . Migraine   . Paresthesia of left arm    Past Surgical History:  Procedure Laterality Date  .  DILITATION & CURRETTAGE/HYSTROSCOPY WITH THERMACHOICE ABLATION N/A 08/17/2013   Procedure: DILATATION & CURETTAGE/HYSTEROSCOPY WITH THERMACHOICE ABLATION;  Surgeon: Jonnie Kind, MD;  Location: [facility];  Service: Gynecology;  Laterality: N/A;  18 ml D5W in & out; total therapy time= 9 minutes 33 seconds; temp= 87 degrees celcius  . HERNIA REPAIR     ventral hernia  . POLYPECTOMY N/A 08/17/2013   Procedure: REMOVAL ENDOMETRIAL POLYP;  Surgeon: Jonnie Kind, MD;  Location: [facility];  Service: Gynecology;  Laterality: N/A;  . TUBAL LIGATION    . ULNAR NERVE REPAIR Left    [facility] 2013   Prior to Admission medications   Medication Sig Start Date End Date Taking? Authorizing Provider  ALPRAZolam Duanne Moron) 1 MG tablet Take 1 mg by mouth 3 (three) times daily. 03/25/18  Yes [provider]  Cholecalciferol (VITAMIN D PO) Take 2 tablets by mouth daily.   Yes [provider]  divalproex (DEPAKOTE ER) 500 MG 24 hr tablet Take 500 mg by mouth daily. 03/06/18  Yes [provider]  DULoxetine (CYMBALTA) 60 MG capsule Take 1 capsule (60 mg total) by mouth daily. Patient taking differently: Take 60 mg by mouth 2 (two) times daily.  07/29/14  Yes Kirsteins, Luanna Salk, MD  gabapentin (NEURONTIN) 300 MG capsule Take 600 mg by mouth 4 (four) times daily.  05/14/16  Yes [provider]  Ipratropium-Albuterol (  COMBIVENT RESPIMAT) 20-100 MCG/ACT AERS respimat Inhale 1 puff into the lungs every 6 (six) hours as needed for wheezing or shortness of breath.   Yes [provider]  LINZESS 145 MCG CAPS capsule Take 145 mcg by mouth daily as needed (IBS constipation).  05/10/14  Yes [provider]  lisinopril-hydrochlorothiazide (PRINZIDE,ZESTORETIC) 20-25 MG per tablet Take 1 tablet by mouth daily. 05/10/14  Yes [provider]  naloxone (NARCAN) nasal spray 4 mg/0.1 mL Place 1 spray into the nose daily as needed (overdose).   Yes [provider]  naproxen  (NAPROSYN) 500 MG tablet Take 500 mg by mouth 2 (two) times daily with a meal. 02/28/18  Yes [provider]  oxycodone (OXY-IR) 5 MG capsule Take 5 mg by mouth every 4 (four) hours as needed for pain.   Yes [provider]  oxyCODONE (ROXICODONE) 15 MG immediate release tablet Take 15 mg by mouth 4 (four) times daily.   Yes [provider]  promethazine (PHENERGAN) 25 MG tablet Take 25 mg by mouth every 6 (six) hours as needed for nausea or vomiting.   Yes [provider]  topiramate (TOPAMAX) 100 MG tablet Take 100 mg by mouth daily as needed (migraine).  06/06/14  Yes [provider]  VENTOLIN HFA 108 (90 Base) MCG/ACT inhaler Inhale 1 puff into the lungs every 4 (four) hours as needed for shortness of breath or wheezing. 01/11/18  Yes [provider]  zolpidem (AMBIEN) 10 MG tablet Take 10 mg by mouth at bedtime.    Yes [provider]  doxycycline (VIBRAMYCIN) 100 MG capsule Take 1 capsule (100 mg total) by mouth 2 (two) times daily. Patient not taking: Reported on 05/03/2018 03/06/18   Velvet Bathe, MD  predniSONE (DELTASONE) 50 MG tablet Take 1 tablet (50 mg total) by mouth daily. Patient not taking: Reported on 05/03/2018 03/06/18   Velvet Bathe, MD   Allergies Allergies  Allergen Reactions  . Other Other (See Comments)    IV meds for migraine, patient stated it made her feel like something was crawling all over her body.    Family History Family History  Problem Relation Age of Onset  . Diabetes Sister   . Hypertension Sister   . Heart disease Sister   . Cancer Sister        brain tumor  . Heart disease Brother   . Diabetes Brother   . Cancer Maternal Aunt        uterine  . Cancer Maternal Grandmother        uterine  . Other Mother        car accident   Social History  reports that she has been smoking cigarettes. She has a 26.00 pack-year smoking history. She has never used smokeless tobacco. She reports that she  does not drink alcohol or use drugs.  She is still smoking actively, has been unable to quit  Review Of Systems:  Review of Systems  Constitutional: Negative.   HENT: Negative.   Eyes: Negative.   Respiratory: Positive for cough and shortness of breath.   Cardiovascular: Negative for chest pain, palpitations, orthopnea and claudication.  Gastrointestinal: Negative.   Genitourinary: Negative.   Musculoskeletal: Negative.   Skin: Negative.   Neurological: Negative.   Endo/Heme/Allergies: Negative.    Events Since Admission: Overnight, since being in the hospital, feels a little bit better Denies any chest pains or chest discomfort  She is not coughing but not bringing up any secretions at  present  She states she wants to be out of the hospital to go attend an interview  Current Status:  Vital Signs: Temp:  [97.7 F (36.5 C)-98.5 F (36.9 C)] 98.3 F (36.8 C) (08/19 0705) Pulse Rate:  [73-96] 73 (08/19 0800) Resp:  [18-35] 20 (08/19 0800) BP: (131-155)/(57-87) 155/87 (08/19 0800) SpO2:  [87 %-96 %] 95 % (08/19 0800)  Physical Examination: General: Middle-aged lady, does not appear to be in distress Neuro: Alert and oriented x3, moving all extremities with no focal findings HEENT: Moist oral mucosa with no lesions Neck: Neck is supple, no JVD, no thyromegaly Cardiovascular: S1-S2 appreciated with no murmurs Lungs: Fair air entry bilaterally, does have rhonchi bilaterally Abdomen: Abdomen is soft bowel sounds normoactive no organomegaly Musculoskeletal: Moving all extremities, no pain or discomfort Skin: No rash  Principal Problem:   Acute respiratory failure with hypoxia (HCC) Active Problems:   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)   SIRS (systemic inflammatory response syndrome) (HCC)   Hypokalemia   Depression   Anxiety   Asthma, chronic, unspecified asthma severity, with acute exacerbation   Chest pain   Tobacco abuse   Pulmonary  infiltrate   ASSESSMENT AND PLAN  PULMONARY Recent Labs  Lab 05/03/18 0015  PHART 7.424  PCO2ART 36.4  PO2ART 57.1*  HCO3 23.4  O2SAT 88.7     CXR: Chest x-ray shows bilateral groundglass changes CT scan chest: Reviewed by myself, multifocal groundglass changes, unchanged compared to CT scan from February 15, 2018  A:  Hypoxemic respiratory failure Abnormal CT scan of the chest showing extensive groundglass changes ARDS Interstitial lung disease Hypersensitivity pneumonitis Underlying obstructive lung disease on previous CT Possibly RB ILD  P:  Continue oxygen support Continue BiPAP as needed-has not needed BiPAP so far   CARDIOVASCULAR Recent Labs  Lab 05/03/18 0130 05/03/18 0337 05/03/18 0726  TROPONINI  --  <0.03  --   LATICACIDVEN 3.32* 3.0* 2.5*    A: Hypertension P:  Stable  RENAL Recent Labs  Lab 05/03/18 0011 05/03/18 0022 05/03/18 0337 05/04/18 0339  NA 140 140 141 140  K 3.3* 3.2* 3.5 3.8  CL 106 104 106 107  CO2 23  --  24 24  BUN 8 5* 7 12  CREATININE 0.57 0.50 0.62 0.63  CALCIUM 8.8*  --  8.5* 8.8*  MG 1.7  --   --  2.1   Intake/Output      08/18 0701 - 08/19 0700 08/19 0701 - 08/20 0700   P.O. 700 100   I.V. (mL/kg) 1000 (10.4)    IV Piggyback 400    Total Intake(mL/kg) 2100 (21.9) 100 (1)   Urine (mL/kg/hr) 2200 (1)    Total Output 2200    Net -100 +100        Urine Occurrence 5 x      A: Stable renal status P:   Follow electrolytes  GASTROINTESTINAL Recent Labs  Lab 05/03/18 0011 05/04/18 0339  AST 51* 32  ALT 12 10  ALKPHOS 46 50  BILITOT 0.6 0.7  PROT 6.7 6.4*  ALBUMIN 3.4* 3.1*    A: Stable status P:   Follow liver panel  HEMATOLOGIC Recent Labs  Lab 05/03/18 0011 05/03/18 0022 05/03/18 0337 05/04/18 0339  HGB 12.2 12.6 12.6 12.0  HCT 37.1 37.0 38.4 37.6  PLT 235  --  235 255  INR  --   --  1.16  --   APTT  --   --  31  --  A: Stable P:  We will follow, no coagulopathy  INFECTIOUS Recent  Labs  Lab 05/03/18 0011 05/03/18 0337 05/04/18 0339  WBC 11.7* 11.8* 18.4*  PROCALCITON  --  0.13  --    Cultures: Culture results pending, negative to date Respiratory panel obtained, pending at present Urine strep pneumo negative  Antibiotics: We will continue cefepime, azithromycin, vancomycin A:   Possible pneumonia P:   We will continue antibiotics at present De-escalate as appropriate-does have a leukocytosis with white count 18 Vancomycin will be discontinued at the end of today if cultures remain negative  Rheumatologic: Follow-up on the serology ESR of 14  When patient is more clinically stable will consider bronchoscopy with washings and biopsy  She is an active smoker The need to quit smoking was discussed again We will continue to follow  Critical care time spent evaluating and reviewing records, formulating plan of care 30 minutes   Adewale Clearence Ped, M.D. [facility] [place] Pager: [phone]  05/04/2018, 10:48 AM

## 2018-05-04 NOTE — Progress Notes (Signed)
RN smelled cigarette smoke coming from pt room. Pt found to be smoking in room. AC was called and informed pt, if continues to smoke, pt will have to be discharged home. Bed Alarm on and call bell in place. RN will continue to monitor.

## 2018-05-04 NOTE — Progress Notes (Signed)
Pt unhooked herself from IV fluid, telemetry monitor, and pulse ox.  Pt was standing at door with purse stating she was going outside to smoke a cigarette.  This RN explained to pt that it is a smoke-free campus and pt's are not medically allowed to leave unit or hospital to go smoke.  Redirected patient back to her room after checking her pulse ox with a portable pulse ox on room air and patient was sating 80%.  Erin RN asked to help assist patient back to room and get her placed back on [place] at 10lpm and hooked pt back up to monitor and pulse ox.  Pt is very tearful during this time as she stated she has a 50 year old female son at home and is unsure if there is adequate supplies there for him.  Also complains of needing to get son ready for school.  Case management notified and then directed this RN to get in touch with Education officer, [employer].  Elmyra Ricks the social worker was told by patient that she had an older child 50 years old watching her 40 year old and actually needed help with food stamp papers.  The social worker explained that she can not help in this area so pt denied needing any further services at this time. Will continue to monitor

## 2018-05-05 DIAGNOSIS — R079 Chest pain, unspecified: Secondary | ICD-10-CM

## 2018-05-05 DIAGNOSIS — Z720 Tobacco use: Secondary | ICD-10-CM

## 2018-05-05 DIAGNOSIS — A419 Sepsis, unspecified organism: Principal | ICD-10-CM

## 2018-05-05 DIAGNOSIS — E876 Hypokalemia: Secondary | ICD-10-CM

## 2018-05-05 DIAGNOSIS — G5640 Causalgia of unspecified upper limb: Secondary | ICD-10-CM

## 2018-05-05 DIAGNOSIS — R918 Other nonspecific abnormal finding of lung field: Secondary | ICD-10-CM

## 2018-05-05 DIAGNOSIS — F329 Major depressive disorder, single episode, unspecified: Secondary | ICD-10-CM

## 2018-05-05 DIAGNOSIS — F419 Anxiety disorder, unspecified: Secondary | ICD-10-CM

## 2018-05-05 LAB — BASIC METABOLIC PANEL
Anion gap: 10 (ref 5–15)
BUN: 17 mg/dL (ref 6–20)
CALCIUM: 8.6 mg/dL — AB (ref 8.9–10.3)
CO2: 27 mmol/L (ref 22–32)
Chloride: 103 mmol/L (ref 98–111)
Creatinine, Ser: 0.61 mg/dL (ref 0.44–1.00)
GFR calc non Af Amer: >60 mL/min (ref >60)
Glucose, Bld: 202 mg/dL — ABNORMAL HIGH (ref 70–99)
Potassium: 3.6 mmol/L (ref 3.5–5.1)
SODIUM: 140 mmol/L (ref 135–145)

## 2018-05-05 LAB — ANCA TITERS: Atypical P-ANCA titer: <1:20 {titer}

## 2018-05-05 LAB — CBC WITH DIFFERENTIAL/PLATELET
BASOS PCT: 0 %
Basophils Absolute: 0 10*3/uL (ref 0.0–0.1)
EOS ABS: 0 10*3/uL (ref 0.0–0.7)
Eosinophils Relative: 0 %
HCT: 37.3 % (ref 36.0–46.0)
Hemoglobin: 12 g/dL (ref 12.0–15.0)
LYMPHS ABS: 1.2 10*3/uL (ref 0.7–4.0)
Lymphocytes Relative: 7 %
MCH: 27.7 pg (ref 26.0–34.0)
MCHC: 32.2 g/dL (ref 30.0–36.0)
MCV: 86.1 fL (ref 78.0–100.0)
MONO ABS: 0.2 10*3/uL (ref 0.1–1.0)
MONOS PCT: 1 %
Neutro Abs: 15.6 10*3/uL — ABNORMAL HIGH (ref 1.7–7.7)
Neutrophils Relative %: 92 %
Platelets: 266 10*3/uL (ref 150–400)
RBC: 4.33 MIL/uL (ref 3.87–5.11)
RDW: 15.2 % (ref 11.5–15.5)
WBC: 17 10*3/uL — ABNORMAL HIGH (ref 4.0–10.5)

## 2018-05-05 LAB — EXTRACTABLE NUCLEAR ANTIGEN ANTIBODY
Ribonucleic Protein: <0.2 AI (ref 0.0–0.9)
SSA (Ro) (ENA) Antibody, IgG: <0.2 AI (ref 0.0–0.9)
Scleroderma (Scl-70) (ENA) Antibody, IgG: <0.2 AI (ref 0.0–0.9)

## 2018-05-05 LAB — MAGNESIUM: MAGNESIUM: 2 mg/dL (ref 1.7–2.4)

## 2018-05-05 LAB — ANTI-JO 1 ANTIBODY, IGG

## 2018-05-05 LAB — LEGIONELLA PNEUMOPHILA SEROGP 1 UR AG: L. PNEUMOPHILA SEROGP 1 UR AG: NEGATIVE

## 2018-05-05 LAB — RHEUMATOID FACTOR: Rhuematoid fact SerPl-aCnc: 21.4 IU/mL — ABNORMAL HIGH (ref 0.0–13.9)

## 2018-05-05 MED ORDER — AZITHROMYCIN 250 MG PO TABS: 250.0000 mg | ORAL_TABLET | Freq: Every day | ORAL | 0 refills | Status: DC

## 2018-05-05 MED ORDER — PANTOPRAZOLE SODIUM 40 MG PO TBEC: 40.0000 mg | DELAYED_RELEASE_TABLET | Freq: Two times a day (BID) | ORAL | 0 refills | Status: DC

## 2018-05-05 MED ORDER — AZITHROMYCIN 250 MG PO TABS: 250.0000 mg | ORAL_TABLET | Freq: Every day | ORAL | Status: DC

## 2018-05-05 MED ORDER — PREDNISONE 20 MG PO TABS: 20.0000 mg | ORAL_TABLET | Freq: Every day | ORAL | Status: DC

## 2018-05-05 MED ORDER — CLONIDINE HCL 0.1 MG PO TABS: 0.1000 mg | ORAL_TABLET | Freq: Two times a day (BID) | ORAL | 0 refills | Status: DC

## 2018-05-05 MED ORDER — HYDROCHLOROTHIAZIDE 25 MG PO TABS: 25.0000 mg | ORAL_TABLET | Freq: Every day | ORAL | 0 refills | Status: DC

## 2018-05-05 MED ORDER — PREDNISONE 20 MG PO TABS: 20.0000 mg | ORAL_TABLET | Freq: Two times a day (BID) | ORAL | Status: DC

## 2018-05-05 MED ORDER — PREDNISONE 10 MG PO TABS: ORAL_TABLET | ORAL | 0 refills | Status: DC

## 2018-05-05 MED ORDER — NICOTINE 21 MG/24HR TD PT24: 21.0000 mg | MEDICATED_PATCH | Freq: Every day | TRANSDERMAL | 0 refills | Status: DC

## 2018-05-05 MED ORDER — PREDNISONE 10 MG PO TABS: 10.0000 mg | ORAL_TABLET | Freq: Every day | ORAL | Status: DC

## 2018-05-05 MED ORDER — GUAIFENESIN ER 600 MG PO TB12: 600.0000 mg | ORAL_TABLET | Freq: Two times a day (BID) | ORAL | 0 refills | Status: DC

## 2018-05-05 NOTE — Discharge Instructions (Signed)
Acute Respiratory Failure, Adult °Acute respiratory failure occurs when there is not enough oxygen passing from your lungs to your body. When this happens, your lungs have trouble removing carbon dioxide from the blood. This causes your blood oxygen level to drop too low as carbon dioxide builds up. °Acute respiratory failure is a medical emergency. It can develop quickly, but it is temporary if treated promptly. Your lung capacity, or how much air your lungs can hold, may improve with time, exercise, and treatment. °What are the causes? °There are many possible causes of acute respiratory failure, including: °· Lung injury. °· Chest injury or damage to the ribs or tissues near the lungs. °· Lung conditions that affect the flow of air and blood into and out of the lungs, such as pneumonia, acute respiratory distress syndrome, and cystic fibrosis. °· Medical conditions, such as strokes or spinal cord injuries, that affect the muscles and nerves that control breathing. °· Blood infection (sepsis). °· Inflammation of the pancreas (pancreatitis). °· A blood clot in the lungs (pulmonary embolism). °· A large-volume blood transfusion. °· Burns. °· Near-drowning. °· Seizure. °· Smoke inhalation. °· Reaction to medicines. °· Alcohol or drug overdose. ° °What increases the risk? °This condition is more likely to develop in people who have: °· A blocked airway. °· Asthma. °· A condition or disease that damages or weakens the muscles, nerves, bones, or tissues that are involved in breathing. °· A serious infection. °· A health problem that blocks the unconscious reflex that is involved in breathing, such as hypothyroidism or sleep apnea. °· A lung injury or trauma. ° °What are the signs or symptoms? °Trouble breathing is the main symptom of acute respiratory failure. Symptoms may also include: °· Rapid breathing. °· Restlessness or anxiety. °· Skin, lips, or fingernails that appear blue (cyanosis). °· Rapid heart  rate. °· Abnormal heart rhythms (arrhythmias). °· Confusion or changes in behavior. °· Tiredness or loss of energy. °· Feeling sleepy or having a loss of consciousness. ° °How is this diagnosed? °Your health care provider can diagnose acute respiratory failure with a medical history and physical exam. During the exam, your health care provider will listen to your heart and check for crackling or wheezing sounds in your lungs. Your may also have tests to confirm the diagnosis and determine what is causing respiratory failure. These tests may include: °· Measuring the amount of oxygen in your blood (pulse oximetry). The measurement comes from a small device that is placed on your finger, earlobe, or toe. °· Other blood tests to measure blood gases and to look for signs of infection. °· Sampling your cerebral spinal fluid or tracheal fluid to check for infections. °· Chest X-ray to look for fluid in spaces that should be filled with air. °· Electrocardiogram (ECG) to look at the heart's electrical activity. ° °How is this treated? °Treatment for this condition usually takes places in a hospital intensive care unit (ICU). Treatment depends on what is causing the condition. It may include one or more treatments until your symptoms improve. Treatment may include: °· Supplemental oxygen. Extra oxygen is given through a tube in the nose, a face mask, or a hood. °· A device such as a continuous positive airway pressure (CPAP) or bi-level positive airway pressure (BiPAP or BPAP) machine. This treatment uses mild air pressure to keep the airways open. A mask or other device will be placed over your nose or mouth. A tube that is connected to a motor will deliver   oxygen through the mask.  Ventilator. This treatment helps move air into and out of the lungs. This may be done with a bag and mask or a machine. For this treatment, a tube is placed in your windpipe (trachea) so air and oxygen can flow to the lungs.  Extracorporeal  membrane oxygenation (ECMO). This treatment temporarily takes over the function of the heart and lungs, supplying oxygen and removing carbon dioxide. ECMO gives the lungs a chance to recover. It may be used if a ventilator is not effective.  Tracheostomy. This is a procedure that creates a hole in the neck to insert a breathing tube.  Receiving fluids and medicines.  Rocking the bed to help breathing.  Follow these instructions at home:  Take over-the-counter and prescription medicines only as told by your health care provider.  Return to normal activities as told by your health care provider. Ask your health care provider what activities are safe for you.  Keep all follow-up visits as told by your health care provider. This is important. How is this prevented? Treating infections and medical conditions that may lead to acute respiratory failure can help prevent the condition from developing. Contact a health care provider if:  You have a fever.  Your symptoms do not improve or they get worse. Get help right away if:  You are having trouble breathing.  You lose consciousness.  Your have cyanosis or turn blue.  You develop a rapid heart rate.  You are confused. These symptoms may represent a serious problem that is an emergency. Do not wait to see if the symptoms will go away. Get medical help right away. Call your local emergency services (911 in the [place] Do not drive yourself to the hospital. This information is not intended to replace advice given to you by your health care provider. Make sure you discuss any questions you have with your health care provider. Document Released: 09/07/2013 Document Revised: 03/30/2016 Document Reviewed: 03/20/2016 Elsevier Interactive Patient Education  2018 [employer].  Smoking Tobacco Information Smoking tobacco will very likely harm your health. Tobacco contains a poisonous (toxic), colorless chemical called nicotine. Nicotine affects  the brain and makes tobacco addictive. This change in your brain can make it hard to stop smoking. Tobacco also has other toxic chemicals that can hurt your body and raise your risk of many cancers. How can smoking tobacco affect me? Smoking tobacco can increase your chances of having serious health conditions, such as:  Cancer. Smoking is most commonly associated with lung cancer, but can lead to cancer in other parts of the body.  Chronic obstructive pulmonary disease (COPD). This is a long-term lung condition that makes it hard to breathe. It also gets worse over time.  High blood pressure (hypertension), heart disease, stroke, or heart attack.  Lung infections, such as pneumonia.  Cataracts. This is when the lenses in the eyes become clouded.  Digestive problems. This may include peptic ulcers, heartburn, and gastroesophageal reflux disease (GERD).  Oral health problems, such as gum disease and tooth loss.  Loss of taste and smell.  Smoking can affect your appearance by causing:  Wrinkles.  Yellow or stained teeth, fingers, and fingernails.  Smoking tobacco can also affect your social life.  Many workplaces, [employer], hotels, and public places are tobacco-free. This means that you may experience challenges in finding places to smoke when away from home.  The cost of a smoking habit can be expensive. Expenses for someone who smokes come in two  ways: ? You spend money on a regular basis to buy tobacco. ? Your health care costs in the long-term are higher if you smoke.  Tobacco smoke can also affect the health of those around you. Children of smokers have greater chances of: ? Sudden infant death syndrome (SIDS). ? Ear infections. ? Lung infections.  What lifestyle changes can be made?  Do not start smoking. Quit if you already do.  To quit smoking: ? Make a plan to quit smoking and commit yourself to it. Look for programs to help you and ask your health care provider  for recommendations and ideas. ? Talk with your health care provider about using nicotine replacement medicines to help you quit. Medicine replacement medicines include gum, lozenges, patches, sprays, or pills. ? Do not replace cigarette smoking with electronic cigarettes, which are commonly called e-cigarettes. The safety of e-cigarettes is not known, and some may contain harmful chemicals. ? Avoid places, people, or situations that tempt you to smoke. ? If you try to quit but return to smoking, don't give up hope. It is very common for people to try a number of times before they fully succeed. When you feel ready again, give it another try.  Quitting smoking might affect the way you eat as well as your weight. Be prepared to monitor your eating habits. Get support in planning and following a healthy diet.  Ask your health care provider about having regular tests (screenings) to check for cancer. This may include blood tests, imaging tests, and other tests.  Exercise regularly. Consider taking walks, joining a gym, or doing yoga or exercise classes.  Develop skills to manage your stress. These skills include meditation. What are the benefits of quitting smoking? By quitting smoking, you may:  Lower your risk of getting cancer and other diseases caused by smoking.  Live longer.  Breathe better.  Lower your blood pressure and heart rate.  Stop your addiction to tobacco.  Stop creating secondhand smoke that hurts other people.  Improve your sense of taste and smell.  Look better over time, due to having fewer wrinkles and less staining.  What can happen if changes are not made? If you do not stop smoking, you may:  Get cancer and other diseases.  Develop COPD or other long-term (chronic) lung conditions.  Develop serious problems with your heart and blood vessels (cardiovascular system).  Need more tests to screen for problems caused by smoking.  Have higher, long-term  healthcare costs from medicines or treatments related to smoking.  Continue to have worsening changes in your lungs, mouth, and nose.  Where to find support: To get support to quit smoking, consider:  Asking your health care provider for more information and resources.  Taking classes to learn more about quitting smoking.  Looking for local organizations that offer resources about quitting smoking.  Joining a support group for people who want to quit smoking in your local community.  Where to find more information: You may find more information about quitting smoking from:  HelpGuide.org: www.helpguide.org/articles/addictions/how-to-quit-smoking.htm  https://hall.com/: smokefree.gov  American Lung Association: www.lung.org  Contact a health care provider if:  You have problems breathing.  Your lips, nose, or fingers turn blue.  You have chest pain.  You are coughing up blood.  You feel faint or you pass out.  You have other noticeable changes that cause you to worry. Summary  Smoking tobacco can negatively affect your health, the health of those around you, your finances, and your  social life.  Do not start smoking. Quit if you already do. If you need help quitting, ask your health care provider.  Think about joining a support group for people who want to quit smoking in your local community. There are many effective programs that will help you to quit this behavior. This information is not intended to replace advice given to you by your health care provider. Make sure you discuss any questions you have with your health care provider. Document Released: 09/17/2016 Document Revised: 09/17/2016 Document Reviewed: 09/17/2016 Elsevier Interactive Patient Education  2018 [employer].  Asthma, Adult Asthma is a condition of the lungs in which the airways tighten and narrow. Asthma can make it hard to breathe. Asthma cannot be cured, but medicine and lifestyle changes can help  control it. Asthma may be started (triggered) by:  Animal skin flakes (dander).  Dust.  Cockroaches.  Pollen.  Mold.  Smoke.  Cleaning products.  Hair sprays or aerosol sprays.  Paint fumes or strong smells.  Cold air, weather changes, and winds.  Crying or laughing hard.  Stress.  Certain medicines or drugs.  Foods, such as dried fruit, potato chips, and sparkling grape juice.  Infections or conditions (colds, flu).  Exercise.  Certain medical conditions or diseases.  Exercise or tiring activities.  Follow these instructions at home:  Take medicine as told by your doctor.  Use a peak flow meter as told by your doctor. A peak flow meter is a tool that measures how well the lungs are working.  Record and keep track of the peak flow meter's readings.  Understand and use the asthma action plan. An asthma action plan is a written plan for taking care of your asthma and treating your attacks.  To help prevent asthma attacks: ? Do not smoke. Stay away from secondhand smoke. ? Change your heating and air conditioning filter often. ? Limit your use of fireplaces and wood stoves. ? Get rid of pests (such as roaches and mice) and their droppings. ? Throw away plants if you see mold on them. ? Clean your floors. Dust regularly. Use cleaning products that do not smell. ? Have someone vacuum when you are not home. Use a vacuum cleaner with a HEPA filter if possible. ? Replace carpet with wood, tile, or vinyl flooring. Carpet can trap animal skin flakes and dust. ? Use allergy-proof pillows, mattress covers, and box spring covers. ? Wash bed sheets and blankets every week in hot water and dry them in a dryer. ? Use blankets that are made of polyester or cotton. ? Clean bathrooms and kitchens with bleach. If possible, have someone repaint the walls in these rooms with mold-resistant paint. Keep out of the rooms that are being cleaned and painted. ? Wash hands  often. Contact a doctor if:  You have make a whistling sound when breaking (wheeze), have shortness of breath, or have a cough even if taking medicine to prevent attacks.  The colored mucus you cough up (sputum) is thicker than usual.  The colored mucus you cough up changes from clear or white to yellow, green, gray, or bloody.  You have problems from the medicine you are taking such as: ? A rash. ? Itching. ? Swelling. ? Trouble breathing.  You need reliever medicines more than 2-3 times a week.  Your peak flow measurement is still at 50-79% of your personal best after following the action plan for 1 hour.  You have a fever. Get help right away  if:  You seem to be worse and are not responding to medicine during an asthma attack.  You are short of breath even at rest.  You get short of breath when doing very little activity.  You have trouble eating, drinking, or talking.  You have chest pain.  You have a fast heartbeat.  Your lips or fingernails start to turn blue.  You are light-headed, dizzy, or faint.  Your peak flow is less than 50% of your personal best. This information is not intended to replace advice given to you by your health care provider. Make sure you discuss any questions you have with your health care provider. Document Released: 02/19/2008 Document Revised: 02/08/2016 Document Reviewed: 04/01/2013 Elsevier Interactive Patient Education  2017 [place] Pneumonia, Adult Pneumonia is an infection of the lungs. One type of pneumonia can happen while a person is in a hospital. A different type can happen when a person is not in a hospital (community-acquired pneumonia). It is easy for this kind to spread from person to person. It can spread to you if you breathe near an infected person who coughs or sneezes. Some symptoms include:  A dry cough.  A wet (productive) cough.  Fever.  Sweating.  Chest pain.  Follow these  instructions at home:  Take over-the-counter and prescription medicines only as told by your doctor. ? Only take cough medicine if you are losing sleep. ? If you were prescribed an antibiotic medicine, take it as told by your doctor. Do not stop taking the antibiotic even if you start to feel better.  Sleep with your head and neck raised (elevated). You can do this by putting a few pillows under your head, or you can sleep in a recliner.  Do not use tobacco products. These include cigarettes, chewing tobacco, and e-cigarettes. If you need help quitting, ask your doctor.  Drink enough water to keep your pee (urine) clear or pale yellow. A shot (vaccine) can help prevent pneumonia. Shots are often suggested for:  People older than 50 years of age.  People older than 50 years of age: ? Who are having cancer treatment. ? Who have long-term (chronic) lung disease. ? Who have problems with their body's defense system (immune system).  You may also prevent pneumonia if you take these actions:  Get the flu (influenza) shot every year.  Go to the dentist as often as told.  Wash your hands often. If soap and water are not available, use hand sanitizer.  Contact a doctor if:  You have a fever.  You lose sleep because your cough medicine does not help. Get help right away if:  You are short of breath and it gets worse.  You have more chest pain.  Your sickness gets worse. This is very serious if: ? You are an older adult. ? Your body's defense system is weak.  You cough up blood. This information is not intended to replace advice given to you by your health care provider. Make sure you discuss any questions you have with your health care provider. Document Released: 02/19/2008 Document Revised: 02/08/2016 Document Reviewed: 12/28/2014 Elsevier Interactive Patient Education  [employer].

## 2018-05-05 NOTE — Discharge Summary (Signed)
Physician Discharge Summary  Joan [name] JWJ:[id number] DOB: 11-Jun-1966 DOA: 05/02/2018  PCP: Larina Earthly, MD  Admit date: 05/02/2018 Discharge date: 05/05/2018  Time spent: 45 minutes  Recommendations for Outpatient Follow-up:  Patient will be discharged to home with home oxygen.  Patient will need to follow up with primary care provider within one week of discharge.  Follow-up with pulmonology in 2 to 3 weeks.  Patient should continue medications as prescribed.  Patient should follow a heart healthy diet.   Discharge Diagnoses:  Acute hypoxic respiratory failure secondary to pneumonia versus asthma exacerbation Sepsis secondary to healthcare associated pneumonia Asthma exacerbation Chest pain Complex regional pain syndrome Essential hypertension Migraine headaches Depression Tobacco abuse  Discharge Condition: Stable  Diet recommendation: heart healthy  Filed Weights   05/03/18 0300  Weight: 95.7 kg    History of present illness:  On 05/03/2018 by Dr. Fuller Plan Joan [name] is a 52 y.o. female with medical history significant of asthma, HTN, tobacco abuse, chronic pain, who presents with 3-4 day history of progressively worsening shortness of breath with productive cough.  Associated symptoms include wheezing and centralized chest pain.  She reports utilizing her inhalers of albuterol and Combivent without relief of symptoms.  Patient chronically has pain in her arms and legs with intermittent numbness denies any fever, nausea, vomiting, leg swelling, recent travel, sick contacts, or immunocompromising disease to her knowledge.  She does continue to smoke 1 pack of cigarettes per day on average and has no will of wanting to quit.    Patient was last admitted to the hospital from 6/20-6/21 with acute respiratory failure with hypoxia thought possibly related infection or provoked medication.  CT angiogram revealed diffuse groundglass opacities.  Patient was discharged  home with breathing treatments, doxycycline, and prednisone.  She denies ever requiring intubation for during previous hospitalizations for asthma exacerbation.  En route with EMS patient was given 125 mg of Solu-Medrol and 2 DuoNeb breathing treatments.    Hospital Course:  Acute hypoxic respiratory failure secondary to pneumonia versus asthma exacerbation -Initially requiring BiPAP, and transition to high flow nasal cannula -Pulmonology consulted and appreciated -Patient weaned to nasal cannula, will need 4 L with ambulation as oxygen saturations dropped to 83%.  At rest patient maintains oxygen saturations at 89 to 92% on room air.  Sepsis secondary to healthcare associated pneumonia -Sepsis etiology appears to be improving -Pulmonology consulted and appreciated -Patient was initially started on broad-spectrum antibiotics, vancomycin was discontinued and patient was placed on cefepime and azithromycin -Pulmonary recommending bronchoscopy, initially was planned for 05/08/2016 however this was canceled and can be done as an outpatient.  Pulmonology recommending outpatient follow-up with steroid taper and azithromycin.  Feels this could be interstitial lung disease versus hypersensitivity pneumonitis.  CT chest unchanged from February 15, 2018.  Patient does have a rheumatology work-up, and will need to be followed as an outpatient.  Asthma exacerbation -Continue prednisone taper and nebulizers as needed  Chest pain -Currently chest pain-free. -Troponin negative. -CTA chest negative for PE -Suspect secondary to hypoxia and pneumonia  Complex regional pain syndrome -Continue Depakote, gabapentin, oxycodone  Essential hypertension -Continue HCTZ, clonidine -Lisinopril was discontinued by pulmonology, thought symptoms could be related to allergy  Migraine headaches -Continue Topamax  Depression -Continue Cymbalta  Tobacco abuse -Discussed smoking  cessation  Procedures: None  Consultations: Pulmonology  Discharge Exam: Vitals:   05/05/18 0810 05/05/18 0919  BP:    Pulse: 65   Resp: (!) 22   Temp:  SpO2: 93% 98%   Patient feeling better today.  Feels her breathing has improved.  Would like to go home.  Denies current chest pain, shortness of breath, abdominal pain, nausea or vomiting, diarrhea or constipation.   General: Well developed, well nourished, NAD, appears stated age  52: NCAT, mucous membranes moist.  Neck: Supple  Cardiovascular: S1 S2 auscultated, RRR, no murmur  Respiratory: Diminished breath sounds with some scattered wheezing rhonchi  Abdomen: Soft, nontender, nondistended, + bowel sounds  Extremities: warm dry without cyanosis clubbing or edema  Neuro: AAOx3, nonfocal  Skin: Without rashes exudates or nodules  Psych: Appropriate mood and affect  Discharge Instructions Discharge Instructions    Discharge instructions   Complete by:  As directed    Patient will be discharged to home with home oxygen.  Patient will need to follow up with primary care provider within one week of discharge.  Follow-up with pulmonology in 2 to 3 weeks.  Patient should continue medications as prescribed.  Patient should follow a heart healthy diet.     Allergies as of 05/05/2018      Reactions   Other Other (See Comments)   IV meds for migraine, patient stated it made her feel like something was crawling all over her body.      Medication List    STOP taking these medications   doxycycline 100 MG capsule Commonly known as:  VIBRAMYCIN   lisinopril-hydrochlorothiazide 20-25 MG tablet Commonly known as:  PRINZIDE,ZESTORETIC     TAKE these medications   ALPRAZolam 1 MG tablet Commonly known as:  XANAX Take 1 mg by mouth 3 (three) times daily.   AMBIEN 10 MG tablet Generic drug:  zolpidem Take 10 mg by mouth at bedtime.   azithromycin 250 MG tablet Commonly known as:  ZITHROMAX Take 1 tablet  (250 mg total) by mouth daily.   cloNIDine 0.1 MG tablet Commonly known as:  CATAPRES Take 1 tablet (0.1 mg total) by mouth 2 (two) times daily.   COMBIVENT RESPIMAT 20-100 MCG/ACT Aers respimat Generic drug:  Ipratropium-Albuterol Inhale 1 puff into the lungs every 6 (six) hours as needed for wheezing or shortness of breath.   divalproex 500 MG 24 hr tablet Commonly known as:  DEPAKOTE ER Take 500 mg by mouth daily.   DULoxetine 60 MG capsule Commonly known as:  CYMBALTA Take 1 capsule (60 mg total) by mouth daily. What changed:  when to take this   gabapentin 300 MG capsule Commonly known as:  NEURONTIN Take 600 mg by mouth 4 (four) times daily.   guaiFENesin 600 MG 12 hr tablet Commonly known as:  MUCINEX Take 1 tablet (600 mg total) by mouth 2 (two) times daily.   hydrochlorothiazide 25 MG tablet Commonly known as:  HYDRODIURIL Take 1 tablet (25 mg total) by mouth daily.   LINZESS 145 MCG Caps capsule Generic drug:  linaclotide Take 145 mcg by mouth daily as needed (IBS constipation).   naloxone 4 MG/0.1ML Liqd nasal spray kit Commonly known as:  NARCAN Place 1 spray into the nose daily as needed (overdose).   naproxen 500 MG tablet Commonly known as:  NAPROSYN Take 500 mg by mouth 2 (two) times daily with a meal.   nicotine 21 mg/24hr patch Commonly known as:  NICODERM CQ - dosed in mg/24 hours Place 1 patch (21 mg total) onto the skin daily.   oxycodone 5 MG capsule Commonly known as:  OXY-IR Take 5 mg by mouth every 4 (four) hours as  needed for pain.   oxyCODONE 15 MG immediate release tablet Commonly known as:  ROXICODONE Take 15 mg by mouth 4 (four) times daily.   pantoprazole 40 MG tablet Commonly known as:  PROTONIX Take 1 tablet (40 mg total) by mouth 2 (two) times daily before a meal.   predniSONE 10 MG tablet Commonly known as:  DELTASONE Take 2 tablets twice a day through 8/24. On 8/25, take 2 tablets daily through 8/28. On 8/29, take 1  tablet daily for 4 days. What changed:    medication strength  how much to take  how to take this  when to take this  additional instructions   promethazine 25 MG tablet Commonly known as:  PHENERGAN Take 25 mg by mouth every 6 (six) hours as needed for nausea or vomiting.   topiramate 100 MG tablet Commonly known as:  TOPAMAX Take 100 mg by mouth daily as needed (migraine).   VENTOLIN HFA 108 (90 Base) MCG/ACT inhaler Generic drug:  albuterol Inhale 1 puff into the lungs every 4 (four) hours as needed for shortness of breath or wheezing.   VITAMIN D PO Take 2 tablets by mouth daily.            Durable Medical Equipment  (From admission, onward)         Start     Ordered   05/05/18 1001  For home use only DME oxygen  Once    Question Answer Comment  Mode or (Route) Nasal cannula   Liters per Minute 4   Frequency Continuous (stationary and portable oxygen unit needed)   Oxygen conserving device Yes   Oxygen delivery system Gas      05/05/18 1000         Allergies  Allergen Reactions  . Other Other (See Comments)    IV meds for migraine, patient stated it made her feel like something was crawling all over her body.   Follow-up Information    Larina Earthly, MD. Schedule an appointment as soon as possible for a visit in 1 week(s).   Specialty:  Family Medicine Why:  Hospital follow up Contact information: [address] [place] [address] [phone]        Laurin Coder, MD. Schedule an appointment as soon as possible for a visit in 3 week(s).   Specialty:  Pulmonary Disease Why:  Hospital follow up Contact information: [address]nd floor [place] [address] [phone]            The results of significant diagnostics from this hospitalization (including imaging, microbiology, ancillary and laboratory) are listed below for reference.    Significant Diagnostic Studies: Dg Chest 2 View  Result Date:  05/03/2018 CLINICAL DATA:  Persistent shortness of breath with probable asthma attack. No relief with treatment. Current smoker. EXAM: CHEST - 2 VIEW COMPARISON:  03/05/2018 FINDINGS: Shallow inspiration. Heart size is normal for technique. Diffuse airspace infiltrates throughout both lungs, similar to previous study. No blunting of costophrenic angles. No pneumothorax. Mediastinal contours appear intact. IMPRESSION: Diffuse bilateral airspace infiltrates in the lungs similar to previous study. Consider multifocal pneumonia, edema, atypical infection, or ARDS. Electronically Signed   By: Lucienne Capers M.D.   On: 05/03/2018 00:08   Ct Angio Chest Pe W Or Wo Contrast  Result Date: 05/03/2018 CLINICAL DATA:  History of asthma and hypertension. Shortness of breath. Left-sided chest pain. Positive D-dimer. EXAM: CT ANGIOGRAPHY CHEST WITH CONTRAST TECHNIQUE: Multidetector CT imaging of the  chest was performed using the standard protocol during bolus administration of intravenous contrast. Multiplanar CT image reconstructions and MIPs were obtained to evaluate the vascular anatomy. CONTRAST:  13m ISOVUE-370 IOPAMIDOL (ISOVUE-370) INJECTION 76% COMPARISON:  03/05/2018 FINDINGS: Cardiovascular: Motion artifact limits examination. Moderately good opacification of the central and segmental pulmonary arteries. No focal filling defects. No evidence of significant pulmonary embolus. Normal caliber thoracic aorta. No aortic dissection. Great vessel origins are patent. Normal heart size. No pericardial effusions. Mediastinum/Nodes: Esophagus is decompressed. Mild to moderate lymphadenopathy throughout the mediastinum and hilar regions. Largest right pretracheal lymph node measures about 17 mm diameter. Appearance is similar to previous study. Likely reactive or inflammatory nodes. Small esophageal hiatal hernia. Esophagus is decompressed. Thyroid gland is unremarkable. Lungs/Pleura: Motion artifact limits examination.  Diffuse bilateral airspace disease throughout both lungs. Pattern appearance are similar to previous study. Differential diagnosis would include multifocal pneumonia, edema, ARDS, cryptogenic organizing pneumonia, hypersensitivity reaction, or atypical infectious process. No pleural effusions. No pneumothorax. Upper Abdomen: No acute abnormalities identified. Musculoskeletal: No chest wall abnormality. No acute or significant osseous findings. Review of the MIP images confirms the above findings. IMPRESSION: 1. No evidence of significant pulmonary embolus. 2. Diffuse bilateral airspace disease throughout both lungs, similar to previous study. Electronically Signed   By: WLucienne CapersM.D.   On: 05/03/2018 04:47    Microbiology: Recent Results (from the past 240 hour(s))  Blood Culture (routine x 2)     Status: None (Preliminary result)   Collection Time: 05/03/18 12:20 AM  Result Value Ref Range Status   Specimen Description   Final    BLOOD RIGHT WRIST Performed at [facility], [place] [address]   Special Requests   Final    BOTTLES DRAWN AEROBIC AND ANAEROBIC Blood Culture adequate volume Performed at [address], [place] [address]   Culture   Final    NO GROWTH 1 DAY Performed at [facility] [address] [place] [address]   Report Status PENDING  Incomplete  Blood Culture (routine x 2)     Status: None (Preliminary result)   Collection Time: 05/03/18 12:25 AM  Result Value Ref Range Status   Specimen Description   Final    BLOOD RIGHT ANTECUBITAL Performed at [address], [place] [address]   Special Requests   Final    BOTTLES DRAWN AEROBIC AND ANAEROBIC Blood Culture adequate volume Performed at [address] [place] [address]   Culture   Final    NO GROWTH 1 DAY Performed at  [facility] [address], [place] [address]   Report Status PENDING  Incomplete  MRSA PCR Screening     Status: None   Collection Time: 05/03/18  2:59 AM  Result Value Ref Range Status   MRSA by PCR NEGATIVE NEGATIVE Final    Comment:        The GeneXpert MRSA Assay (FDA approved for NASAL specimens only), is one component of a comprehensive MRSA colonization surveillance program. It is not intended to diagnose MRSA infection nor to guide or monitor treatment for MRSA infections. Performed at [facility] [address], [place] [address]  Respiratory Panel by PCR     Status: None   Collection Time: 05/03/18  7:24 AM  Result Value Ref  Range Status   Adenovirus NOT DETECTED NOT DETECTED Final   Coronavirus 229E NOT DETECTED NOT DETECTED Final   Coronavirus HKU1 NOT DETECTED NOT DETECTED Final   Coronavirus NL63 NOT DETECTED NOT DETECTED Final   Coronavirus OC43 NOT DETECTED NOT DETECTED Final   Metapneumovirus NOT DETECTED NOT DETECTED Final   Rhinovirus / Enterovirus NOT DETECTED NOT DETECTED Final   Influenza A NOT DETECTED NOT DETECTED Final   Influenza B NOT DETECTED NOT DETECTED Final   Parainfluenza Virus 1 NOT DETECTED NOT DETECTED Final   Parainfluenza Virus 2 NOT DETECTED NOT DETECTED Final   Parainfluenza Virus 3 NOT DETECTED NOT DETECTED Final   Parainfluenza Virus 4 NOT DETECTED NOT DETECTED Final   Respiratory Syncytial Virus NOT DETECTED NOT DETECTED Final   Bordetella pertussis NOT DETECTED NOT DETECTED Final   Chlamydophila pneumoniae NOT DETECTED NOT DETECTED Final   Mycoplasma pneumoniae NOT DETECTED NOT DETECTED Final    Comment: Performed at [facility], [place] [address] [place], [place] [address]     Labs: Basic Metabolic Panel: Recent Labs  Lab 05/03/18 0011 05/03/18 0022 05/03/18 0337 05/04/18 0339 05/05/18 0323  NA 140 140 141 140 140  K 3.3* 3.2* 3.5 3.8 3.6  CL 106 104 106 107 103   CO2 23  --  _0 GLUCOSE 196* 205* 187* 178* 202*  BUN 8 5* _1 CREATININE 0.57 0.50 0.62 0.63 0.61  CALCIUM 8.8*  --  8.5* 8.8* 8.6*  MG 1.7  --   --  2.1 2.0   Liver Function Tests: Recent Labs  Lab 05/03/18 0011 05/04/18 0339  AST 51* 32  ALT 12 10  ALKPHOS 46 50  BILITOT 0.6 0.7  PROT 6.7 6.4*  ALBUMIN 3.4* 3.1*   No results for input(s): LIPASE, AMYLASE in the last 168 hours. No results for input(s): AMMONIA in the last 168 hours. CBC: Recent Labs  Lab 05/03/18 0011 05/03/18 0022 05/03/18 0337 05/04/18 0339 05/05/18 0323  WBC 11.7*  --  11.8* 18.4* 17.0*  NEUTROABS 10.0*  --   --  17.2* 15.6*  HGB 12.2 12.6 12.6 12.0 12.0  HCT 37.1 37.0 38.4 37.6 37.3  MCV 84.9  --  85.5 86.2 86.1  PLT 235  --  235 255 266   Cardiac Enzymes: Recent Labs  Lab 05/03/18 0337  TROPONINI <0.03   BNP: BNP (last 3 results) Recent Labs    03/05/18 1221 05/03/18 0011  BNP 31.9 38.3    ProBNP (last 3 results) No results for input(s): PROBNP in the last 8760 hours.  CBG: No results for input(s): GLUCAP in the last 168 hours.     Signed:  Cristal Ford  [facility] 05/05/2018, 11:40 AM

## 2018-05-05 NOTE — Care Management Note (Addendum)
Case Management Note  Patient Details  Name: Joan [name] MRN: [id number] Date of Birth: 1965-12-10  Subjective/Objective:                  Acute respiratory failure with hypoxia [facility])  Action/Plan: Home o2 ordered and ready for when patient is dcd.  Expected Discharge Date:  05/05/18               Expected Discharge Plan:  [place]  In-House Referral:     Discharge planning Services  CM Consult  Post Acute Care Choice:  Durable Medical Equipment Choice offered to:  Patient  DME Arranged:  Oxygen DME Agency:  [place]:    [facility] Agency:     Status of Service:  Completed, signed off  If discussed at [place] of Stay Meetings, dates discussed:    Additional Comments:  Leeroy Cha, RN 05/05/2018, 11:40 AM

## 2018-05-05 NOTE — Progress Notes (Signed)
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  On initial eval in 05/2016 did have assoc fever to 10.7 and did improve p rx for CAP but never back to baseline and readmit 03/05/18 with cough/sob/cp /hypoxemia while on ACEi which she has continued and rx with prednisone /doxy and better enough to return to work folding clothes at [employer] but only for a few days then readmit 8/18 with 3-4 days acute on chronic dyspnea assoc with mostly dry coughing and midline pain with cough and desats requiring bipap with no change on CTa from prior admit so PCCM consulted.  Feels worse with heat / humidity and denies IV drug use/ cocaine inhalation.    No obvious other day to day or daytime variability or assoc excess/ purulent sputum or mucus plugs or hemoptysis or   chest tightness, subjective wheeze or overt sinus or hb symptoms.    . Also denies any obvious fluctuation of symptoms with weather or environmental changes or other aggravating or alleviating factors except as outlined above   No unusual exposure hx or h/o childhood pna/ asthma or knowledge of premature birth.   Past Medical History:  Diagnosis Date  . Anemia   . Anxiety   . Asthma   . Chronic back pain   . Chronic neck pain   . Complex regional pain syndrome   . GERD (gastroesophageal reflux disease)   . Hematuria 09/07/2013  . Hypertension   . IBS (irritable bowel syndrome)   . Migraine   . Paresthesia of left arm    Past Surgical History:  Procedure Laterality Date  .  DILITATION & CURRETTAGE/HYSTROSCOPY WITH THERMACHOICE ABLATION N/A 08/17/2013   Procedure: DILATATION & CURETTAGE/HYSTEROSCOPY WITH THERMACHOICE ABLATION;  Surgeon: Jonnie Kind, MD;  Location: [facility];  Service: Gynecology;  Laterality: N/A;  18 ml D5W in & out; total therapy time= 9 minutes 33 seconds; temp= 87 degrees celcius  . HERNIA REPAIR     ventral hernia  . POLYPECTOMY N/A 08/17/2013   Procedure: REMOVAL ENDOMETRIAL POLYP;  Surgeon: Jonnie Kind, MD;  Location: [facility];  Service: Gynecology;  Laterality: N/A;  . TUBAL LIGATION    . ULNAR NERVE REPAIR Left    [facility] 2013   Prior to Admission medications   Medication Sig Start Date End Date Taking? Authorizing Provider  ALPRAZolam Duanne Moron) 1 MG tablet Take 1 mg by mouth 3 (three) times daily. 03/25/18  Yes [provider]  Cholecalciferol (VITAMIN D PO) Take 2 tablets by mouth daily.   Yes [provider]  divalproex (DEPAKOTE ER) 500 MG 24 hr tablet Take 500 mg by mouth daily. 03/06/18  Yes [provider]  DULoxetine (CYMBALTA) 60 MG capsule Take 1 capsule (60 mg total) by mouth daily. Patient taking differently: Take 60 mg by mouth 2 (two) times daily.  07/29/14  Yes Kirsteins, Luanna Salk, MD  gabapentin (NEURONTIN) 300 MG capsule Take 600 mg by mouth 4 (four) times daily.  05/14/16  Yes [provider]  Ipratropium-Albuterol (  COMBIVENT RESPIMAT) 20-100 MCG/ACT AERS respimat Inhale 1 puff into the lungs every 6 (six) hours as needed for wheezing or shortness of breath.   Yes [provider]  LINZESS 145 MCG CAPS capsule Take 145 mcg by mouth daily as needed (IBS constipation).  05/10/14  Yes [provider]  lisinopril-hydrochlorothiazide (PRINZIDE,ZESTORETIC) 20-25 MG per tablet Take 1 tablet by mouth daily. 05/10/14  Yes [provider]  naloxone (NARCAN) nasal spray 4 mg/0.1 mL Place 1 spray into the nose daily as needed (overdose).   Yes [provider]  naproxen  (NAPROSYN) 500 MG tablet Take 500 mg by mouth 2 (two) times daily with a meal. 02/28/18  Yes [provider]  oxycodone (OXY-IR) 5 MG capsule Take 5 mg by mouth every 4 (four) hours as needed for pain.   Yes [provider]  oxyCODONE (ROXICODONE) 15 MG immediate release tablet Take 15 mg by mouth 4 (four) times daily.   Yes [provider]  promethazine (PHENERGAN) 25 MG tablet Take 25 mg by mouth every 6 (six) hours as needed for nausea or vomiting.   Yes [provider]  topiramate (TOPAMAX) 100 MG tablet Take 100 mg by mouth daily as needed (migraine).  06/06/14  Yes [provider]  VENTOLIN HFA 108 (90 Base) MCG/ACT inhaler Inhale 1 puff into the lungs every 4 (four) hours as needed for shortness of breath or wheezing. 01/11/18  Yes [provider]  zolpidem (AMBIEN) 10 MG tablet Take 10 mg by mouth at bedtime.    Yes [provider]  doxycycline (VIBRAMYCIN) 100 MG capsule Take 1 capsule (100 mg total) by mouth 2 (two) times daily. Patient not taking: Reported on 05/03/2018 03/06/18   Velvet Bathe, MD  predniSONE (DELTASONE) 50 MG tablet Take 1 tablet (50 mg total) by mouth daily. Patient not taking: Reported on 05/03/2018 03/06/18   Velvet Bathe, MD   Allergies Allergies  Allergen Reactions  . Other Other (See Comments)    IV meds for migraine, patient stated it made her feel like something was crawling all over her body.    Family History Family History  Problem Relation Age of Onset  . Diabetes Sister   . Hypertension Sister   . Heart disease Sister   . Cancer Sister        brain tumor  . Heart disease Brother   . Diabetes Brother   . Cancer Maternal Aunt        uterine  . Cancer Maternal Grandmother        uterine  . Other Mother        car accident   Social History  reports that she has been smoking cigarettes. She has a 26.00 pack-year smoking history. She has never used smokeless tobacco. She reports that she  does not drink alcohol or use drugs.  She is still smoking actively, has been unable to quit  Review Of Systems:  Review of Systems  Constitutional: Negative.   HENT: Negative.   Eyes: Negative.   Respiratory: Positive for shortness of breath and wheezing. Negative for cough.   Cardiovascular: Negative for chest pain, palpitations, orthopnea and claudication.  Gastrointestinal: Negative.   Genitourinary: Negative.   Musculoskeletal: Negative.   Skin: Negative.   Neurological: Negative.   Endo/Heme/Allergies: Negative.    Events Since Admission: 05/05/2018: She was found smoking in her room, she promises not to do this again, apologized for her actions  She feels better generally She is off  oxygen supplementation, able to ambulate, still has some shortness of breath-overall better  05/04/2018: Overnight, since being in the hospital, feels a little bit better Denies any chest pains or chest discomfort  She is not coughing but not bringing up any secretions at present  She states she wants to be out of the hospital to go attend an interview  Current Status:  Vital Signs: Temp:  [98.1 F (36.7 C)-98.9 F (37.2 C)] 98.2 F (36.8 C) (08/20 0740) Pulse Rate:  [59-145] 65 (08/20 0810) Resp:  [18-28] 22 (08/20 0810) BP: (141-162)/(73-91) 141/73 (08/20 0400) SpO2:  [83 %-99 %] 98 % (08/20 0919)  Physical Examination: General: Middle-aged lady, does not appear to be in distress Neuro: Alert and oriented x3, moving all extremities with no focal findings HEENT: Moist oral mucosa with no lesions Neck: Neck is supple, no JVD, no thyromegaly Cardiovascular: S1-S2 appreciated with no murmurs Lungs: Fair air entry bilaterally, rhonchi bilaterally, appears to be improving from previous Abdomen: Abdomen is soft bowel sounds normoactive no organomegaly Musculoskeletal: Moving all extremities, no pain or discomfort Skin: No rash  Principal Problem:   Acute respiratory failure with  hypoxia (HCC) Active Problems:   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)   SIRS (systemic inflammatory response syndrome) (HCC)   Hypokalemia   Depression   Anxiety   Asthma, chronic, unspecified asthma severity, with acute exacerbation   Chest pain   Tobacco abuse   Pulmonary infiltrate   ASSESSMENT AND PLAN  PULMONARY Recent Labs  Lab 05/03/18 0015  PHART 7.424  PCO2ART 36.4  PO2ART 57.1*  HCO3 23.4  O2SAT 88.7     CXR: Chest x-ray shows bilateral groundglass changes CT scan chest: Reviewed by myself, multifocal groundglass changes, unchanged compared to CT scan from February 15, 2018  A:   Hypoxemic respiratory failure Abnormal CT scan showing extensive groundglass changes ARDS Cystic lung disease Hypersensitivity pneumonitis Underlying obstructive lung disease 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  P:  Continue oxygen support-currently on room air We will discontinue BiPAP   CARDIOVASCULAR Recent Labs  Lab 05/03/18 0130 05/03/18 0337 05/03/18 0726  TROPONINI  --  <0.03  --   LATICACIDVEN 3.32* 3.0* 2.5*    A: Hypertension  P:  Stable state of present RENAL Recent Labs  Lab 05/03/18 0011 05/03/18 0022 05/03/18 0337 05/04/18 0339 05/05/18 0323  NA 140 140 141 140 140  K 3.3* 3.2* 3.5 3.8 3.6  CL 106 104 106 107 103  CO2 23  --  _0 BUN 8 5* _1 CREATININE 0.57 0.50 0.62 0.63 0.61  CALCIUM 8.8*  --  8.5* 8.8* 8.6*  MG 1.7  --   --  2.1 2.0   Intake/Output      08/19 0701 - 08/20 0700 08/20 0701 - 08/21 0700   P.O. 820 240   I.V. (mL/kg) 0 (0)    IV Piggyback 338.3    Total Intake(mL/kg) 1158.3 (12.1) 240 (2.5)   Urine (mL/kg/hr) 400 (0.2)    Total Output 400    Net +758.3 +240          A: Stable renal status P:   Electrolytes have been stable  GASTROINTESTINAL Recent Labs  Lab 05/03/18 0011 05/04/18 0339  AST 51* 32  ALT 12 10  ALKPHOS 46 50  BILITOT 0.6 0.7  PROT 6.7 6.4*  ALBUMIN 3.4* 3.1*    A: Stable status P:    Follow-up hepatic profile  HEMATOLOGIC Recent Labs  Lab  05/03/18 0011 05/03/18 0022 05/03/18 0337 05/04/18 0339 05/05/18 0323  HGB 12.2 12.6 12.6 12.0 12.0  HCT 37.1 37.0 38.4 37.6 37.3  PLT 235  --  235 255 266  INR  --   --  1.16  --   --   APTT  --   --  31  --   --    A: Stable state P:   No coagulopathy, follow  INFECTIOUS Recent Labs  Lab 05/03/18 0011 05/03/18 0337 05/04/18 0339 05/05/18 0323  WBC 11.7* 11.8* 18.4* 17.0*  PROCALCITON  --  0.13  --   --    Cultures: Culture results pending, negative to date Respiratory panel obtained, pending at present Urine strep pneumo negative  Antibiotics: We will continue cefepime, azithromycin, vancomycin We will de-escalate antibiotics at present A:   Possible pneumonia Leukocytosis likely related to being on steroids Has not been having any fevers, clinically improving  P:   We will continue antibiotics at present-we will switch to azithromycin-anti-infective and anti-inflammatory properties  Rheumatologic: Follow-up on the serology RA latex turbid of 21.4 ESR of 14  She is an active smoker She promises to work on quitting smoking  Globally feels better Denies any pain or discomfort Ambulating without oxygen, still limited as she gets short of breath Overall better Wants to go home  We will de-escalate antibiotics- Will leave on a macrolide antibiotic Tapering doses of steroids  She is aware that going home does not mean that the haziness in her lungs have cleared up completely Further evaluation and intervention will be needed to ascertain the process causing the haziness in the lungs and shortness of breath The importance of quitting smoking was discussed extensively Serological studies may still take a few days to come back  Clinically she feels much better We will continue steroids and macrolides I will see her in the office in a 2 to 3 weeks  Stable to be considered for  discharge  Laurin Coder, M.D. Pulmonary and [place] Pager: [phone]  05/05/2018, 9:58 AM

## 2018-05-05 NOTE — Progress Notes (Signed)
Will cancel bronchoscopy for Friday Will follow as outpatient

## 2018-05-05 NOTE — Progress Notes (Signed)
Pt on room air at rest was maintaining O2 sats 89-92%. Pt up walking on room air dropped to 83%.  Pt placed on 4lpm O2 via [place] to walk 1 lap around nurses station and O2 sats maintained 94.

## 2018-05-08 ENCOUNTER — Encounter (HOSPITAL_COMMUNITY): Payer: Medicare HMO

## 2018-05-08 LAB — HYPERSENSITIVITY PNEUMONITIS
A. Pullulans Abs: NEGATIVE
A.Fumigatus #1 Abs: NEGATIVE
Micropolyspora faeni, IgG: NEGATIVE
PIGEON SERUM ABS: NEGATIVE
THERMOACT. SACCHARII: NEGATIVE
Thermoactinomyces vulgaris, IgG: NEGATIVE

## 2018-05-08 LAB — CULTURE, BLOOD (ROUTINE X 2)
CULTURE: NO GROWTH
CULTURE: NO GROWTH
SPECIAL REQUESTS: ADEQUATE
Special Requests: ADEQUATE

## 2018-05-08 SURGERY — BRONCHOSCOPY, WITH FLUOROSCOPY
Anesthesia: Moderate Sedation | Laterality: Bilateral

## 2019-02-09 ENCOUNTER — Other Ambulatory Visit: Payer: Self-pay | Admitting: Internal Medicine

## 2019-02-09 DIAGNOSIS — M5416 Radiculopathy, lumbar region: Secondary | ICD-10-CM

## 2019-02-27 ENCOUNTER — Other Ambulatory Visit: Payer: Medicare HMO

## 2019-03-15 ENCOUNTER — Other Ambulatory Visit: Payer: Medicare HMO

## 2019-03-18 ENCOUNTER — Other Ambulatory Visit: Payer: Medicare HMO

## 2020-01-24 ENCOUNTER — Other Ambulatory Visit: Payer: Self-pay | Admitting: Physician Assistant

## 2020-01-24 DIAGNOSIS — M5410 Radiculopathy, site unspecified: Secondary | ICD-10-CM

## 2020-03-08 ENCOUNTER — Inpatient Hospital Stay: Admission: RE | Admit: 2020-03-08 | Payer: Medicare HMO | Source: Ambulatory Visit

## 2020-03-15 ENCOUNTER — Encounter (HOSPITAL_COMMUNITY): Payer: Self-pay | Admitting: Emergency Medicine

## 2020-03-15 ENCOUNTER — Emergency Department (HOSPITAL_COMMUNITY): Payer: Medicare Other

## 2020-03-15 ENCOUNTER — Other Ambulatory Visit: Payer: Self-pay

## 2020-03-15 ENCOUNTER — Inpatient Hospital Stay (HOSPITAL_COMMUNITY)
Admission: EM | Admit: 2020-03-15 | Discharge: 2020-03-18 | DRG: 202 | Disposition: A | Discharge status: Home / Self Care | Payer: Medicare Other | Attending: Internal Medicine | Admitting: Internal Medicine

## 2020-03-15 DIAGNOSIS — Z6835 Body mass index (BMI) 35.0-35.9, adult: Secondary | ICD-10-CM

## 2020-03-15 DIAGNOSIS — Z7951 Long term (current) use of inhaled steroids: Secondary | ICD-10-CM

## 2020-03-15 DIAGNOSIS — J189 Pneumonia, unspecified organism: Secondary | ICD-10-CM | POA: Diagnosis present

## 2020-03-15 DIAGNOSIS — J449 Chronic obstructive pulmonary disease, unspecified: Secondary | ICD-10-CM | POA: Diagnosis present

## 2020-03-15 DIAGNOSIS — Z9981 Dependence on supplemental oxygen: Secondary | ICD-10-CM

## 2020-03-15 DIAGNOSIS — J45901 Unspecified asthma with (acute) exacerbation: Principal | ICD-10-CM | POA: Diagnosis present

## 2020-03-15 DIAGNOSIS — T380X5A Adverse effect of glucocorticoids and synthetic analogues, initial encounter: Secondary | ICD-10-CM | POA: Diagnosis present

## 2020-03-15 DIAGNOSIS — J9621 Acute and chronic respiratory failure with hypoxia: Secondary | ICD-10-CM | POA: Diagnosis present

## 2020-03-15 DIAGNOSIS — Z20822 Contact with and (suspected) exposure to covid-19: Secondary | ICD-10-CM | POA: Diagnosis present

## 2020-03-15 DIAGNOSIS — G894 Chronic pain syndrome: Secondary | ICD-10-CM | POA: Diagnosis present

## 2020-03-15 DIAGNOSIS — D72829 Elevated white blood cell count, unspecified: Secondary | ICD-10-CM | POA: Diagnosis present

## 2020-03-15 DIAGNOSIS — F17210 Nicotine dependence, cigarettes, uncomplicated: Secondary | ICD-10-CM | POA: Diagnosis present

## 2020-03-15 DIAGNOSIS — G4700 Insomnia, unspecified: Secondary | ICD-10-CM | POA: Diagnosis present

## 2020-03-15 DIAGNOSIS — F329 Major depressive disorder, single episode, unspecified: Secondary | ICD-10-CM | POA: Diagnosis present

## 2020-03-15 DIAGNOSIS — F32A Depression, unspecified: Secondary | ICD-10-CM | POA: Diagnosis present

## 2020-03-15 DIAGNOSIS — J9601 Acute respiratory failure with hypoxia: Secondary | ICD-10-CM | POA: Diagnosis not present

## 2020-03-15 DIAGNOSIS — E872 Acidosis: Secondary | ICD-10-CM | POA: Diagnosis present

## 2020-03-15 DIAGNOSIS — R9431 Abnormal electrocardiogram [ECG] [EKG]: Secondary | ICD-10-CM | POA: Diagnosis present

## 2020-03-15 DIAGNOSIS — I10 Essential (primary) hypertension: Secondary | ICD-10-CM | POA: Diagnosis present

## 2020-03-15 DIAGNOSIS — E876 Hypokalemia: Secondary | ICD-10-CM | POA: Diagnosis present

## 2020-03-15 DIAGNOSIS — K219 Gastro-esophageal reflux disease without esophagitis: Secondary | ICD-10-CM | POA: Diagnosis present

## 2020-03-15 DIAGNOSIS — Z79899 Other long term (current) drug therapy: Secondary | ICD-10-CM

## 2020-03-15 DIAGNOSIS — F419 Anxiety disorder, unspecified: Secondary | ICD-10-CM | POA: Diagnosis present

## 2020-03-15 MED ORDER — IPRATROPIUM BROMIDE HFA 17 MCG/ACT IN AERS
6.0000 | INHALATION_SPRAY | Freq: Once | RESPIRATORY_TRACT | Status: AC
  Administered 2020-03-16: 6 via RESPIRATORY_TRACT
  Filled 2020-03-15: qty 12.9

## 2020-03-15 NOTE — ED Provider Notes (Signed)
[facility] DEPT Provider Note   CSN: [id number] Arrival date & time: 03/15/20  2319     History No chief complaint on file.   Joan [name] is a 54 y.o. female.  HPI     This 54 year old female with a history of asthma on PRN oxygen, hypertension, migraine who presents with shortness of breath.  Patient reports 2-day history of worsening shortness of breath.  She did not have any nebulizer at home but was using her inhaler with no relief.  She initially called out for EMS and was noted to be 50% on room air using accessory muscles and wheezing.  She was treated and refused transport.  EMS was called back out and she was treated again with albuterol, Atrovent, epi.  She was placed on supplemental oxygen.  Patient reports some chest pressure.  No chest pain.  Reports significant dyspnea on exertion.  She denies any recent fevers or cough.  She has had 1 COVID-19 vaccination.  She denies any leg swelling or orthopnea.  No history of blood clots.  Past Medical History:  Diagnosis Date  . Anemia   . Anxiety   . Asthma   . Chronic back pain   . Chronic neck pain   . Complex regional pain syndrome   . GERD (gastroesophageal reflux disease)   . Hematuria 09/07/2013  . Hypertension   . IBS (irritable bowel syndrome)   . Migraine   . Paresthesia of left arm     Patient Active Problem List   Diagnosis Date Noted  . Asthma, chronic, unspecified asthma severity, with acute exacerbation 05/03/2018  . Chest pain 05/03/2018  . Tobacco abuse 05/03/2018  . Pulmonary infiltrate 05/03/2018  . Acute hypoxemic respiratory failure ([facility]) 03/05/2018  . Hypokalemia 03/05/2018  . Depression 03/05/2018  . Anxiety 03/05/2018  . HTN (hypertension) 03/05/2018  . SIRS (systemic inflammatory response syndrome) ([facility])   . SOB (shortness of breath)   . Chronic pain syndrome   . CAP (community acquired pneumonia) 06/05/2016  . Acute respiratory failure with hypoxia  ([place]) 06/05/2016  . 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) 06/27/2014  . Spinal stenosis in cervical region 06/27/2014  . Back pain 09/07/2013  . Hematuria 09/07/2013  . Endometrium, polyp 08/17/2013  . Anemia 08/13/2013    Past Surgical History:  Procedure Laterality Date  . DILITATION & CURRETTAGE/HYSTROSCOPY WITH THERMACHOICE ABLATION N/A 08/17/2013   Procedure: DILATATION & CURETTAGE/HYSTEROSCOPY WITH THERMACHOICE ABLATION;  Surgeon: Jonnie Kind, MD;  Location: [facility];  Service: Gynecology;  Laterality: N/A;  18 ml D5W in & out; total therapy time= 9 minutes 33 seconds; temp= 87 degrees celcius  . HERNIA REPAIR     ventral hernia  . POLYPECTOMY N/A 08/17/2013   Procedure: REMOVAL ENDOMETRIAL POLYP;  Surgeon: Jonnie Kind, MD;  Location: [facility];  Service: Gynecology;  Laterality: N/A;  . TUBAL LIGATION    . ULNAR NERVE REPAIR Left    [facility] 2013     OB History    Gravida  4   Para  4   Term  4   Preterm      AB      Living  4     SAB      TAB      Ectopic      Multiple      Live Births              Family History  Problem Relation Age of  Onset  . Diabetes Sister   . Hypertension Sister   . Heart disease Sister   . Cancer Sister        brain tumor  . Heart disease Brother   . Diabetes Brother   . Cancer Maternal Aunt        uterine  . Cancer Maternal Grandmother        uterine  . Other Mother        car accident    Social History   Tobacco Use  . Smoking status: Current Every Day Smoker    Packs/day: 1.00    Years: 26.00    Pack years: 26.00    Types: Cigarettes  . Smokeless tobacco: Never Used  . Tobacco comment: 04/23/16 1/2- 3/4 PPD  Substance Use Topics  . Alcohol use: No  . Drug use: No    Home Medications Prior to Admission medications   Medication Sig Start Date End Date Taking? Authorizing Provider  ALPRAZolam Duanne Moron) 0.5 MG tablet Take 0.5 mg by mouth 3 (three) times daily as needed for anxiety. 01/07/20  Yes  [provider]  cyclobenzaprine (FLEXERIL) 5 MG tablet Take 5 mg by mouth 3 (three) times daily as needed for muscle spasms. 01/07/20  Yes [provider]  divalproex (DEPAKOTE ER) 500 MG 24 hr tablet Take 500 mg by mouth daily. 03/06/18  Yes [provider]  DULoxetine (CYMBALTA) 60 MG capsule Take 1 capsule (60 mg total) by mouth daily. Patient taking differently: Take 60 mg by mouth 2 (two) times daily.  07/29/14  Yes Kirsteins, Luanna Salk, MD  gabapentin (NEURONTIN) 300 MG capsule Take 600 mg by mouth 4 (four) times daily.  05/14/16  Yes [provider]  hydrochlorothiazide (HYDRODIURIL) 50 MG tablet Take 50 mg by mouth daily. 01/18/20  Yes [provider]  lisinopril (ZESTRIL) 20 MG tablet Take 20 mg by mouth daily. 01/07/20  Yes [provider]  naloxone (NARCAN) nasal spray 4 mg/0.1 mL Place 1 spray into the nose daily as needed (overdose).   Yes [provider]  naproxen (NAPROSYN) 500 MG tablet Take 500 mg by mouth 2 (two) times daily with a meal. 02/28/18  Yes [provider]  oxyCODONE (ROXICODONE) 15 MG immediate release tablet Take 15 mg by mouth 4 (four) times daily as needed for pain.   Yes [provider]  pantoprazole (PROTONIX) 40 MG tablet Take 1 tablet (40 mg total) by mouth 2 (two) times daily before a meal. 05/05/18  Yes Mikhail, [place], DO  promethazine (PHENERGAN) 25 MG tablet Take 25 mg by mouth every 6 (six) hours as needed for nausea or vomiting.   Yes [provider]  SYMBICORT 160-4.5 MCG/ACT inhaler Inhale 2 puffs into the lungs daily. 01/17/20  Yes [provider]  topiramate (TOPAMAX) 25 MG tablet Take 25 mg by mouth at bedtime. 11/11/19  Yes [provider]  VENTOLIN HFA 108 (90 Base) MCG/ACT inhaler Inhale 1 puff into the lungs every 4 (four) hours as needed for shortness of breath or wheezing. 01/11/18  Yes [provider]  zolpidem (AMBIEN) 10 MG tablet Take 10  mg by mouth at bedtime as needed for sleep.   Yes [provider]  Ipratropium-Albuterol (COMBIVENT RESPIMAT) 20-100 MCG/ACT AERS respimat Inhale 1 puff into the lungs every 6 (six) hours as needed for wheezing or shortness of breath.    [provider]    Allergies    Other  Review of Systems  Review of Systems  Constitutional: Negative for fever.  Respiratory: Positive for chest tightness, shortness of breath and wheezing. Negative for cough.   Cardiovascular: Negative for chest pain and leg swelling.  Gastrointestinal: Negative for abdominal pain, nausea and vomiting.  Genitourinary: Negative for dysuria.  All other systems reviewed and are negative.   Physical Exam Updated Vital Signs BP (!) 158/93   Pulse 89   Temp 98.7 F (37.1 C) (Oral)   Resp (!) 28   Ht 1.676 m (5\' 6" )   Wt 99.8 kg   SpO2 97%   BMI 35.51 kg/m   Physical Exam Vitals and nursing note reviewed.  Constitutional:      Appearance: She is well-developed. She is obese.     Comments: Tachypnea, mild respiratory distress noted  HENT:     Head: Normocephalic and atraumatic.     Mouth/Throat:     Mouth: Mucous membranes are moist.  Eyes:     Pupils: Pupils are equal, round, and reactive to light.  Cardiovascular:     Rate and Rhythm: Regular rhythm. Tachycardia present.     Heart sounds: Normal heart sounds.  Pulmonary:     Effort: Pulmonary effort is normal. No respiratory distress.     Breath sounds: No wheezing.     Comments: Speaking in short sentences, nonrebreather in place, accessory muscle use noted, she has fair air movement on exam with no significant wheezing noted Abdominal:     General: Bowel sounds are normal.     Palpations: Abdomen is soft.     Tenderness: There is no abdominal tenderness.  Musculoskeletal:     Cervical back: Neck supple.     Right lower leg: No edema.     Left lower leg: No edema.  Skin:    General: Skin is warm and dry.  Neurological:      Mental Status: She is alert and oriented to person, place, and time.  Psychiatric:        Mood and Affect: Mood normal.     ED Results / Procedures / Treatments   Labs (all labs ordered are listed, but only abnormal results are displayed) Labs Reviewed  CBC WITH DIFFERENTIAL/PLATELET - Abnormal; Notable for the following components:      Result Value   WBC 12.3 (*)    Hemoglobin 11.9 (*)    RDW 17.0 (*)    Neutro Abs 10.2 (*)    Abs Immature Granulocytes 0.14 (*)    All other components within normal limits  BASIC METABOLIC PANEL - Abnormal; Notable for the following components:   Potassium 3.3 (*)    Chloride 96 (*)    Glucose, Bld 185 (*)    All other components within normal limits  D-DIMER, QUANTITATIVE (NOT AT [facility]) - Abnormal; Notable for the following components:   D-Dimer, Quant 1.10 (*)    All other components within normal limits  SARS CORONAVIRUS 2 BY RT PCR (HOSPITAL ORDER, [facility])  CULTURE, BLOOD (ROUTINE X 2)  CULTURE, BLOOD (ROUTINE X 2)  BLOOD GAS, ARTERIAL  BRAIN NATRIURETIC PEPTIDE  LACTIC ACID, PLASMA  LACTIC ACID, PLASMA  TROPONIN I (HIGH SENSITIVITY)  TROPONIN I (HIGH SENSITIVITY)    EKG EKG Interpretation  Date/Time:  Wednesday March 15 2020 23:53:42 EDT Ventricular Rate:  96 PR Interval:    QRS Duration: 74 QT Interval:  419 QTC Calculation: 530 R Axis:   60 Text Interpretation: Sinus rhythm Probable left atrial enlargement Borderline abnrm T,  anterolateral leads Prolonged QT interval Confirmed by Thayer Jew [phone]) on 03/16/2020 2:44:40 AM   Radiology CT Angio Chest PE W and/or Wo Contrast  Result Date: 03/16/2020 CLINICAL DATA:  Hypoxemia, asthma exacerbation, elevated D dimer EXAM: CT ANGIOGRAPHY CHEST WITH CONTRAST TECHNIQUE: Multidetector CT imaging of the chest was performed using the standard protocol during bolus administration of intravenous contrast. Multiplanar CT image reconstructions and MIPs  were obtained to evaluate the vascular anatomy. CONTRAST:  156mL OMNIPAQUE IOHEXOL 350 MG/ML SOLN COMPARISON:  05/03/2018, 03/05/2018, 03/16/2020 FINDINGS: Cardiovascular: This is a technically adequate evaluation of the pulmonary vasculature. There are no central or segmental pulmonary emboli. Evaluation of the subsegmental branches, especially at the bases, is limited due to respiratory motion. The heart is unremarkable without pericardial effusion. Normal caliber of the thoracic aorta. Mediastinum/Nodes: Mediastinal lymphadenopathy again noted, largest lymph node in the precarinal region measuring 17 mm in short axis, not appreciably changed. The thyroid, trachea, and esophagus are unremarkable. Lungs/Pleura: There is widespread bilateral airspace disease, corresponding chest x-ray findings. There is slight sparing of the lung bases. No effusion or pneumothorax. The central airways are patent. Upper Abdomen: No acute abnormality. Musculoskeletal: There are no acute or destructive bony lesions. Reconstructed images demonstrate no additional findings. Review of the MIP images confirms the above findings. IMPRESSION: 1. No evidence of central or segmental pulmonary emboli. 2. Widespread bilateral airspace disease. Differential diagnosis would include multifocal infection, pneumonitis, vasculitis, or less likely edema. Alveolar sarcoid could give this pattern, especially given the concomitant mediastinal lymphadenopathy. Overall, the pattern is similar to numerous prior CTs dating to 2019. Electronically Signed   By: Randa Ngo M.D.   On: 03/16/2020 02:49   DG Chest Portable 1 View  Result Date: 03/16/2020 CLINICAL DATA:  Short of breath EXAM: PORTABLE CHEST 1 VIEW COMPARISON:  01/08/2019 FINDINGS: Single frontal view of the chest demonstrates an enlarged cardiac silhouette. There is widespread bilateral airspace disease. No large effusion or pneumothorax. No acute bony abnormality. IMPRESSION: 1. Diffuse  bilateral airspace disease compatible with pulmonary edema or multifocal infection. Electronically Signed   By: Randa Ngo M.D.   On: 03/16/2020 00:18    Procedures Procedures (including critical care time)  CRITICAL CARE Performed by: Merryl Hacker   Total critical care time: 60 minutes  Critical care time was exclusive of separately billable procedures and treating other patients.  Critical care was necessary to treat or prevent imminent or life-threatening deterioration.  Critical care was time spent personally by me on the following activities: development of treatment plan with patient and/or surrogate as well as nursing, discussions with consultants, evaluation of patient's response to treatment, examination of patient, obtaining history from patient or surrogate, ordering and performing treatments and interventions, ordering and review of laboratory studies, ordering and review of radiographic studies, pulse oximetry and re-evaluation of patient's condition.   Medications Ordered in ED Medications  sodium chloride (PF) 0.9 % injection (has no administration in time range)  albuterol (PROVENTIL,VENTOLIN) solution continuous neb (has no administration in time range)  ipratropium (ATROVENT HFA) inhaler 6 puff (6 puffs Inhalation Given 03/16/20 0006)  morphine 4 MG/ML injection 4 mg (4 mg Intravenous Given 03/16/20 0041)  iohexol (OMNIPAQUE) 350 MG/ML injection 100 mL (100 mLs Intravenous Contrast Given 03/16/20 0210)    ED Course  I have reviewed the triage vital signs and the nursing notes.  Pertinent labs & imaging results that were available during my care of the patient were reviewed by me and considered in  my medical decision making (see chart for details).  Clinical Course as of Mar 16 304  Thu Mar 16, 2020  1610 Asked to reassess by nursing.  Patient is tachypneic in the 50s.  She still is moving fair air.  No obvious wheeze.  Given her history, I will start her on a  nebulizer although I am concerned for other etiology given no significant wheezing.  CT scan is pending.  BiPAP ordered for work of breathing.   [CH]    Clinical Course User Index [CH] Sudeep Scheibel, Barbette Hair, MD   MDM Rules/Calculators/A&P                           Patient presents with shortness of breath.  Progressively worsening over the last several days.  History of the same.  Reports history of asthma.  She was treated for asthma with steroids, nebulizer, magnesium, and epinephrine by EMS.  On my evaluation, she is significantly tachypneic and in mild respiratory distress.  However, she is not particularly wheezy or tight.  This would not fit with her clinical picture and respiratory distress.  Will broaden differential.  Other considerations include pneumonia, PE, pulmonary edema.  Patient was placed on supplemental oxygen.  HFA inhaler was given.  Covid testing was sent.  Labs reviewed.  Mild leukocytosis to 12.3.  No significant metabolic derangements.  D-dimer is 1.1.  CT scan obtained.  Chest x-ray is concerning for multifocal pneumonia versus pulmonary edema.  Patient denies any infectious symptoms and she is afebrile.  Will hold antibiotics until better imaging with CT.  I reevaluated the patient and she continues to be tachypneic.  For this reason, BiPAP was ordered for work of breathing.  Covid testing is negative.  Of note, I did review the patient's chart.  She had 2 admissions in 2019 with similar presentations.  She had CT imaging that showed diffuse bilateral lung disease.  She was to have a bronchoscopy as an outpatient.  Patient reports that she has never had a bronchoscopy but has had pulmonary function testing.    CT without PE but persistent bilateral airspace disease similar to prior.  Differential includes multifocal pneumonia, pneumonitis, vasculitis, alveolar sarcoid.  Blood cultures and lactate ordered.  Discussed with patient that she will need admission.  She likely needs a  bronc for definitive diagnosis.  Will discuss antibiotic therapy with admitting hospitalist.  We will hold further nebulizer treatments as she does not improve significantly with these.  INGRA ROTHER was evaluated in Emergency Department on 03/16/2020 for the symptoms described in the history of present illness. She was evaluated in the context of the global COVID-19 pandemic, which necessitated consideration that the patient might be at risk for infection with the SARS-CoV-2 virus that causes COVID-19. Institutional protocols and algorithms that pertain to the evaluation of patients at risk for COVID-19 are in a state of rapid change based on information released by regulatory bodies including the CDC and federal and state organizations. These policies and algorithms were followed during the patient's care in the ED.   Final Clinical Impression(s) / ED Diagnoses Final diagnoses:  Acute on chronic respiratory failure with hypoxia [facility])    Rx / DC Orders ED Discharge Orders    None       Ithiel Liebler, Barbette Hair, MD 03/16/20 [phone]

## 2020-03-15 NOTE — ED Triage Notes (Addendum)
Pt BIB EMS from home c/o asthma exacerbation x 2 days that has progressively gotten worse. When EMS arrived, pts initial O2 50% RA, using accessory muscles and wheezing. EMS gave 15 albuterol, 0.5 Atrovent, 125 solumedrol, and 2g mag. Pt refused to go to the hospital and o2 at 65% when ems left. EMS was called out again and gave 10 albuterol, 0.5 Atrovent, and 0.3 epi. Pt now at 95% 12 L NRB. Pt has been using home o2 since last year. Hx of asthma and COPD.

## 2020-03-16 ENCOUNTER — Emergency Department (HOSPITAL_COMMUNITY): Payer: Medicare Other

## 2020-03-16 ENCOUNTER — Encounter (HOSPITAL_COMMUNITY): Payer: Self-pay

## 2020-03-16 DIAGNOSIS — G894 Chronic pain syndrome: Secondary | ICD-10-CM | POA: Diagnosis present

## 2020-03-16 DIAGNOSIS — F329 Major depressive disorder, single episode, unspecified: Secondary | ICD-10-CM

## 2020-03-16 DIAGNOSIS — G4700 Insomnia, unspecified: Secondary | ICD-10-CM | POA: Diagnosis present

## 2020-03-16 DIAGNOSIS — F419 Anxiety disorder, unspecified: Secondary | ICD-10-CM | POA: Diagnosis present

## 2020-03-16 DIAGNOSIS — R9431 Abnormal electrocardiogram [ECG] [EKG]: Secondary | ICD-10-CM | POA: Diagnosis present

## 2020-03-16 DIAGNOSIS — J9601 Acute respiratory failure with hypoxia: Secondary | ICD-10-CM | POA: Diagnosis present

## 2020-03-16 DIAGNOSIS — J449 Chronic obstructive pulmonary disease, unspecified: Secondary | ICD-10-CM | POA: Diagnosis present

## 2020-03-16 DIAGNOSIS — J189 Pneumonia, unspecified organism: Secondary | ICD-10-CM | POA: Diagnosis present

## 2020-03-16 DIAGNOSIS — Z79899 Other long term (current) drug therapy: Secondary | ICD-10-CM | POA: Diagnosis not present

## 2020-03-16 DIAGNOSIS — J9621 Acute and chronic respiratory failure with hypoxia: Secondary | ICD-10-CM | POA: Diagnosis present

## 2020-03-16 DIAGNOSIS — T380X5A Adverse effect of glucocorticoids and synthetic analogues, initial encounter: Secondary | ICD-10-CM | POA: Diagnosis present

## 2020-03-16 DIAGNOSIS — E872 Acidosis: Secondary | ICD-10-CM | POA: Diagnosis present

## 2020-03-16 DIAGNOSIS — Z9981 Dependence on supplemental oxygen: Secondary | ICD-10-CM | POA: Diagnosis not present

## 2020-03-16 DIAGNOSIS — E876 Hypokalemia: Secondary | ICD-10-CM | POA: Diagnosis present

## 2020-03-16 DIAGNOSIS — D72829 Elevated white blood cell count, unspecified: Secondary | ICD-10-CM | POA: Diagnosis present

## 2020-03-16 DIAGNOSIS — Z6835 Body mass index (BMI) 35.0-35.9, adult: Secondary | ICD-10-CM | POA: Diagnosis not present

## 2020-03-16 DIAGNOSIS — K219 Gastro-esophageal reflux disease without esophagitis: Secondary | ICD-10-CM | POA: Diagnosis present

## 2020-03-16 DIAGNOSIS — J45901 Unspecified asthma with (acute) exacerbation: Secondary | ICD-10-CM | POA: Diagnosis present

## 2020-03-16 DIAGNOSIS — Z7951 Long term (current) use of inhaled steroids: Secondary | ICD-10-CM | POA: Diagnosis not present

## 2020-03-16 DIAGNOSIS — F17210 Nicotine dependence, cigarettes, uncomplicated: Secondary | ICD-10-CM | POA: Diagnosis present

## 2020-03-16 DIAGNOSIS — I10 Essential (primary) hypertension: Secondary | ICD-10-CM | POA: Diagnosis present

## 2020-03-16 DIAGNOSIS — Z20822 Contact with and (suspected) exposure to covid-19: Secondary | ICD-10-CM | POA: Diagnosis present

## 2020-03-16 LAB — CBC WITH DIFFERENTIAL/PLATELET
Abs Immature Granulocytes: 0.1 10*3/uL — ABNORMAL HIGH (ref 0.00–0.07)
Abs Immature Granulocytes: 0.14 10*3/uL — ABNORMAL HIGH (ref 0.00–0.07)
Basophils Absolute: 0 10*3/uL (ref 0.0–0.1)
Basophils Absolute: 0 10*3/uL (ref 0.0–0.1)
Basophils Relative: 0 %
Basophils Relative: 0 %
Eosinophils Absolute: 0 10*3/uL (ref 0.0–0.5)
Eosinophils Absolute: 0.1 10*3/uL (ref 0.0–0.5)
Eosinophils Relative: 0 %
Eosinophils Relative: 1 %
HCT: 36.2 % (ref 36.0–46.0)
HCT: 37.5 % (ref 36.0–46.0)
Hemoglobin: 11.9 g/dL — ABNORMAL LOW (ref 12.0–15.0)
Hemoglobin: 12.2 g/dL (ref 12.0–15.0)
Immature Granulocytes: 1 %
Immature Granulocytes: 1 %
Lymphocytes Relative: 12 %
Lymphocytes Relative: 6 %
Lymphs Abs: 0.8 10*3/uL (ref 0.7–4.0)
Lymphs Abs: 1.5 10*3/uL (ref 0.7–4.0)
MCH: 27.2 pg (ref 26.0–34.0)
MCH: 27.3 pg (ref 26.0–34.0)
MCHC: 32.5 g/dL (ref 30.0–36.0)
MCHC: 32.9 g/dL (ref 30.0–36.0)
MCV: 83 fL (ref 80.0–100.0)
MCV: 83.5 fL (ref 80.0–100.0)
Monocytes Absolute: 0.2 10*3/uL (ref 0.1–1.0)
Monocytes Absolute: 0.4 10*3/uL (ref 0.1–1.0)
Monocytes Relative: 2 %
Monocytes Relative: 3 %
Neutro Abs: 10.2 10*3/uL — ABNORMAL HIGH (ref 1.7–7.7)
Neutro Abs: 11.9 10*3/uL — ABNORMAL HIGH (ref 1.7–7.7)
Neutrophils Relative %: 83 %
Neutrophils Relative %: 91 %
Platelets: 320 10*3/uL (ref 150–400)
Platelets: 325 10*3/uL (ref 150–400)
RBC: 4.36 MIL/uL (ref 3.87–5.11)
RBC: 4.49 MIL/uL (ref 3.87–5.11)
RDW: 17 % — ABNORMAL HIGH (ref 11.5–15.5)
RDW: 17.1 % — ABNORMAL HIGH (ref 11.5–15.5)
WBC: 12.3 10*3/uL — ABNORMAL HIGH (ref 4.0–10.5)
WBC: 13 10*3/uL — ABNORMAL HIGH (ref 4.0–10.5)
nRBC: 0 % (ref 0.0–0.2)
nRBC: 0 % (ref 0.0–0.2)

## 2020-03-16 LAB — D-DIMER, QUANTITATIVE: D-Dimer, Quant: 1.1 ug/mL-FEU — ABNORMAL HIGH (ref 0.00–0.50)

## 2020-03-16 LAB — BRAIN NATRIURETIC PEPTIDE: B Natriuretic Peptide: 52.2 pg/mL (ref 0.0–100.0)

## 2020-03-16 LAB — BASIC METABOLIC PANEL
Anion gap: 12 (ref 5–15)
Anion gap: 15 (ref 5–15)
BUN: 10 mg/dL (ref 6–20)
BUN: 9 mg/dL (ref 6–20)
CO2: 27 mmol/L (ref 22–32)
CO2: 27 mmol/L (ref 22–32)
Calcium: 8.9 mg/dL (ref 8.9–10.3)
Calcium: 9.1 mg/dL (ref 8.9–10.3)
Chloride: 96 mmol/L — ABNORMAL LOW (ref 98–111)
Chloride: 98 mmol/L (ref 98–111)
Creatinine, Ser: 0.63 mg/dL (ref 0.44–1.00)
Creatinine, Ser: 0.74 mg/dL (ref 0.44–1.00)
GFR calc Af Amer: >60 mL/min (ref >60)
GFR calc Af Amer: >60 mL/min (ref >60)
GFR calc non Af Amer: >60 mL/min (ref >60)
GFR calc non Af Amer: >60 mL/min (ref >60)
Glucose, Bld: 183 mg/dL — ABNORMAL HIGH (ref 70–99)
Glucose, Bld: 185 mg/dL — ABNORMAL HIGH (ref 70–99)
Potassium: 3.3 mmol/L — ABNORMAL LOW (ref 3.5–5.1)
Potassium: 3.9 mmol/L (ref 3.5–5.1)
Sodium: 137 mmol/L (ref 135–145)
Sodium: 138 mmol/L (ref 135–145)

## 2020-03-16 LAB — BLOOD GAS, ARTERIAL
Acid-Base Excess: 3.2 mmol/L — ABNORMAL HIGH (ref 0.0–2.0)
Bicarbonate: 27.4 mmol/L (ref 20.0–28.0)
O2 Saturation: 97.3 %
Patient temperature: 97.8
pCO2 arterial: 41.2 mmHg (ref 32.0–48.0)
pH, Arterial: 7.435 (ref 7.350–7.450)
pO2, Arterial: 91.2 mmHg (ref 83.0–108.0)

## 2020-03-16 LAB — LACTIC ACID, PLASMA
Lactic Acid, Venous: 2.4 mmol/L (ref 0.5–1.9)
Lactic Acid, Venous: 1.9 mmol/L (ref 0.5–1.9)

## 2020-03-16 LAB — MRSA PCR SCREENING: MRSA by PCR: NEGATIVE

## 2020-03-16 LAB — SARS CORONAVIRUS 2 BY RT PCR (HOSPITAL ORDER, PERFORMED IN ~~LOC~~ HOSPITAL LAB): SARS Coronavirus 2: NEGATIVE

## 2020-03-16 LAB — PROCALCITONIN: Procalcitonin: 0.23 ng/mL

## 2020-03-16 LAB — TROPONIN I (HIGH SENSITIVITY)
Troponin I (High Sensitivity): 3 ng/L (ref <18)
Troponin I (High Sensitivity): 2 ng/L (ref <18)

## 2020-03-16 LAB — MAGNESIUM: Magnesium: 2.1 mg/dL (ref 1.7–2.4)

## 2020-03-16 MED ORDER — CHLORHEXIDINE GLUCONATE CLOTH 2 % EX PADS
6.0000 | MEDICATED_PAD | Freq: Every day | CUTANEOUS | Status: DC
  Administered 2020-03-18: 6 via TOPICAL

## 2020-03-16 MED ORDER — SODIUM CHLORIDE 0.9% FLUSH: 3.0000 mL | INTRAVENOUS | Status: DC | PRN

## 2020-03-16 MED ORDER — PANTOPRAZOLE SODIUM 40 MG PO TBEC
40.0000 mg | DELAYED_RELEASE_TABLET | Freq: Two times a day (BID) | ORAL | Status: DC
  Administered 2020-03-16 – 2020-03-18 (×4): 40 mg via ORAL
  Filled 2020-03-16 (×5): qty 1

## 2020-03-16 MED ORDER — ACETAMINOPHEN 650 MG RE SUPP: 650.0000 mg | Freq: Four times a day (QID) | RECTAL | Status: DC | PRN

## 2020-03-16 MED ORDER — SODIUM CHLORIDE 0.9 % IV SOLN
100.0000 mg | Freq: Two times a day (BID) | INTRAVENOUS | Status: DC
  Administered 2020-03-16 – 2020-03-17 (×3): 100 mg via INTRAVENOUS
  Filled 2020-03-16 (×4): qty 100

## 2020-03-16 MED ORDER — SODIUM CHLORIDE 0.9 % IV SOLN: 1.0000 g | Freq: Once | INTRAVENOUS | Status: DC

## 2020-03-16 MED ORDER — ACETAMINOPHEN 325 MG PO TABS
650.0000 mg | ORAL_TABLET | Freq: Four times a day (QID) | ORAL | Status: DC | PRN
  Administered 2020-03-16: 650 mg via ORAL
  Filled 2020-03-16 (×2): qty 2

## 2020-03-16 MED ORDER — OXYCODONE HCL 5 MG PO TABS
15.0000 mg | ORAL_TABLET | Freq: Four times a day (QID) | ORAL | Status: DC | PRN
  Administered 2020-03-16 – 2020-03-18 (×7): 15 mg via ORAL
  Filled 2020-03-16 (×9): qty 3

## 2020-03-16 MED ORDER — ALPRAZOLAM 0.5 MG PO TABS
0.5000 mg | ORAL_TABLET | Freq: Three times a day (TID) | ORAL | Status: DC | PRN
  Administered 2020-03-16 – 2020-03-17 (×4): 0.5 mg via ORAL
  Filled 2020-03-16 (×4): qty 1

## 2020-03-16 MED ORDER — ORAL CARE MOUTH RINSE
15.0000 mL | Freq: Two times a day (BID) | OROMUCOSAL | Status: DC
  Administered 2020-03-16 – 2020-03-18 (×4): 15 mL via OROMUCOSAL

## 2020-03-16 MED ORDER — MORPHINE SULFATE (PF) 4 MG/ML IV SOLN
4.0000 mg | Freq: Once | INTRAVENOUS | Status: AC
  Administered 2020-03-16: 4 mg via INTRAVENOUS
  Filled 2020-03-16: qty 1

## 2020-03-16 MED ORDER — SODIUM CHLORIDE 0.9 % IV SOLN: 250.0000 mL | INTRAVENOUS | Status: DC | PRN

## 2020-03-16 MED ORDER — DULOXETINE HCL 60 MG PO CPEP
60.0000 mg | ORAL_CAPSULE | Freq: Two times a day (BID) | ORAL | Status: DC
  Administered 2020-03-16 – 2020-03-18 (×5): 60 mg via ORAL
  Filled 2020-03-16: qty 1
  Filled 2020-03-16: qty 2
  Filled 2020-03-16: qty 1
  Filled 2020-03-16 (×2): qty 2

## 2020-03-16 MED ORDER — SODIUM CHLORIDE 0.9 % IV SOLN: 500.0000 mg | INTRAVENOUS | Status: DC

## 2020-03-16 MED ORDER — ALBUTEROL SULFATE (2.5 MG/3ML) 0.083% IN NEBU
2.5000 mg | INHALATION_SOLUTION | RESPIRATORY_TRACT | Status: DC | PRN
  Filled 2020-03-16: qty 3

## 2020-03-16 MED ORDER — METHYLPREDNISOLONE SODIUM SUCC 125 MG IJ SOLR
60.0000 mg | Freq: Three times a day (TID) | INTRAMUSCULAR | Status: DC
  Administered 2020-03-16 – 2020-03-17 (×3): 60 mg via INTRAVENOUS
  Filled 2020-03-16 (×2): qty 2

## 2020-03-16 MED ORDER — SODIUM CHLORIDE 0.9 % IV SOLN
2.0000 g | INTRAVENOUS | Status: DC
  Administered 2020-03-17: 2 g via INTRAVENOUS
  Filled 2020-03-16: qty 20

## 2020-03-16 MED ORDER — ASPIRIN-ACETAMINOPHEN-CAFFEINE 250-250-65 MG PO TABS
1.0000 | ORAL_TABLET | Freq: Four times a day (QID) | ORAL | Status: DC | PRN
  Administered 2020-03-16: 1 via ORAL
  Filled 2020-03-16 (×3): qty 1

## 2020-03-16 MED ORDER — NICOTINE 21 MG/24HR TD PT24
21.0000 mg | MEDICATED_PATCH | Freq: Every day | TRANSDERMAL | Status: DC
  Administered 2020-03-16 – 2020-03-18 (×4): 21 mg via TRANSDERMAL
  Filled 2020-03-16 (×4): qty 1

## 2020-03-16 MED ORDER — SODIUM CHLORIDE 0.9 % IV SOLN
500.0000 mg | Freq: Once | INTRAVENOUS | Status: AC
  Administered 2020-03-16: 500 mg via INTRAVENOUS
  Filled 2020-03-16: qty 500

## 2020-03-16 MED ORDER — SODIUM CHLORIDE 0.9 % IV SOLN
2.0000 g | Freq: Once | INTRAVENOUS | Status: AC
  Administered 2020-03-16: 2 g via INTRAVENOUS
  Filled 2020-03-16: qty 20

## 2020-03-16 MED ORDER — DIVALPROEX SODIUM ER 500 MG PO TB24
500.0000 mg | ORAL_TABLET | Freq: Every day | ORAL | Status: DC
  Administered 2020-03-16 – 2020-03-18 (×3): 500 mg via ORAL
  Filled 2020-03-16: qty 2
  Filled 2020-03-16 (×2): qty 1

## 2020-03-16 MED ORDER — POTASSIUM CHLORIDE 10 MEQ/100ML IV SOLN
10.0000 meq | INTRAVENOUS | Status: AC
  Administered 2020-03-16 (×2): 10 meq via INTRAVENOUS
  Filled 2020-03-16 (×2): qty 100

## 2020-03-16 MED ORDER — IOHEXOL 350 MG/ML SOLN
100.0000 mL | Freq: Once | INTRAVENOUS | Status: AC | PRN
  Administered 2020-03-16: 100 mL via INTRAVENOUS

## 2020-03-16 MED ORDER — ALBUTEROL (5 MG/ML) CONTINUOUS INHALATION SOLN: 15.0000 mg/h | INHALATION_SOLUTION | Freq: Once | RESPIRATORY_TRACT | Status: DC

## 2020-03-16 MED ORDER — LABETALOL HCL 5 MG/ML IV SOLN
10.0000 mg | INTRAVENOUS | Status: DC | PRN
  Filled 2020-03-16: qty 4

## 2020-03-16 MED ORDER — SODIUM CHLORIDE 0.9% FLUSH: 3.0000 mL | Freq: Two times a day (BID) | INTRAVENOUS | Status: DC

## 2020-03-16 MED ORDER — ENOXAPARIN SODIUM 40 MG/0.4ML ~~LOC~~ SOLN
40.0000 mg | SUBCUTANEOUS | Status: DC
  Administered 2020-03-16 – 2020-03-18 (×3): 40 mg via SUBCUTANEOUS
  Filled 2020-03-16 (×3): qty 0.4

## 2020-03-16 MED ORDER — SENNOSIDES-DOCUSATE SODIUM 8.6-50 MG PO TABS
1.0000 | ORAL_TABLET | Freq: Every evening | ORAL | Status: DC | PRN
  Administered 2020-03-17: 1 via ORAL
  Filled 2020-03-16: qty 1

## 2020-03-16 MED ORDER — ZOLPIDEM TARTRATE 10 MG PO TABS
10.0000 mg | ORAL_TABLET | Freq: Every evening | ORAL | Status: DC | PRN
  Administered 2020-03-16 – 2020-03-17 (×2): 10 mg via ORAL
  Filled 2020-03-16 (×2): qty 1

## 2020-03-16 MED ORDER — TOPIRAMATE 25 MG PO TABS
25.0000 mg | ORAL_TABLET | Freq: Every day | ORAL | Status: DC
  Administered 2020-03-16 – 2020-03-17 (×2): 25 mg via ORAL
  Filled 2020-03-16 (×2): qty 1

## 2020-03-16 MED ORDER — MOMETASONE FURO-FORMOTEROL FUM 200-5 MCG/ACT IN AERO
2.0000 | INHALATION_SPRAY | Freq: Two times a day (BID) | RESPIRATORY_TRACT | Status: DC
  Administered 2020-03-16 – 2020-03-17 (×3): 2 via RESPIRATORY_TRACT
  Filled 2020-03-16: qty 8.8

## 2020-03-16 MED ORDER — CHLORHEXIDINE GLUCONATE CLOTH 2 % EX PADS
6.0000 | MEDICATED_PAD | Freq: Every day | CUTANEOUS | Status: DC
  Administered 2020-03-16: 6 via TOPICAL

## 2020-03-16 MED ORDER — SODIUM CHLORIDE (PF) 0.9 % IJ SOLN
INTRAMUSCULAR | Status: AC
  Filled 2020-03-16: qty 50

## 2020-03-16 MED ORDER — METHYLPREDNISOLONE SODIUM SUCC 40 MG IJ SOLR
40.0000 mg | Freq: Three times a day (TID) | INTRAMUSCULAR | Status: DC
  Administered 2020-03-16: 40 mg via INTRAVENOUS
  Filled 2020-03-16: qty 1

## 2020-03-16 MED ORDER — CYCLOBENZAPRINE HCL 5 MG PO TABS
5.0000 mg | ORAL_TABLET | Freq: Three times a day (TID) | ORAL | Status: DC | PRN
  Administered 2020-03-17: 5 mg via ORAL
  Filled 2020-03-16: qty 1

## 2020-03-16 MED ORDER — SODIUM CHLORIDE 0.9 % IV BOLUS
250.0000 mL | Freq: Once | INTRAVENOUS | Status: AC
  Administered 2020-03-16: 250 mL via INTRAVENOUS

## 2020-03-16 MED ORDER — GABAPENTIN 300 MG PO CAPS
600.0000 mg | ORAL_CAPSULE | Freq: Four times a day (QID) | ORAL | Status: DC
  Administered 2020-03-16 – 2020-03-18 (×8): 600 mg via ORAL
  Filled 2020-03-16 (×9): qty 2

## 2020-03-16 NOTE — Progress Notes (Signed)
PT requested to be removed from BiPAP- done. Placed PT back on [facility]NC 40% and 30 LPM. RN aware.

## 2020-03-16 NOTE — H&P (Signed)
History and Physical    [name] LOV:[id number] DOB: 19-Aug-1966 DOA: 03/15/2020  PCP: Larina Earthly, MD   Patient coming from: Home   Chief Complaint: SOB   HPI: Joan [name] is a 54 y.o. female with medical history significant for asthma, hypertension, chronic pain, migraines, depression, anxiety, and tobacco abuse, who presented to the emergency department with shortness of breath.  Patient reports developing increased dyspnea on 03/14/2020, was short of breath with mild exertion, had occasional cough with scant sputum production, but was able to go to work and complete her usual activities.  She progressively worsened and had a hard time walking a short distance yesterday, and by last night was short of breath even at rest.  She has supplemental oxygen at home for use as needed but had not required that recently.  She eventually called EMS yesterday and was found to have saturation in the 50s on room air and in acute distress.  EMS treated the patient with steroids, breathing treatments, and IV magnesium, but the patient refused transport to the hospital at that time.  Shortly after EMS left, she reports being unable to walk a few steps to the bathroom due to SOB and called EMS again for transport to the hospital.  She has not noticed any fevers or chills, denies any lower extremity swelling or tenderness, and has not had any chest pain associated with this.  She felt that the breathing treatments were helping initially.    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  ED Course: Upon arrival  to the ED, patient is found to be afebrile, saturating mid 90s on BiPAP, tachypneic with respiratory rate as high as 40, and with blood pressure 140/100.  EKG features sinus rhythm with QTc interval 530 ms.  Chest x-ray with diffuse bilateral airspace disease that could reflect edema or infection.  CTA chest is negative for PE but concerning for widespread bilateral airspace disease that overall appears similar to prior scans and could reflect infection, pneumonitis, vasculitis, sarcoidosis, or less likely edema.  Chemistry panel features a glucose of 185 and potassium 3.3.  CBC notable for leukocytosis to 12,300.  Troponin normal x2.  BNP normal.  Lactic acid 2.4.  COVID-19 PCR is negative.  Blood cultures were collected in the ED, patient was placed on BiPAP, and she was treated with Atrovent, albuterol, Rocephin, and azithromycin.  Review of Systems:  All other systems reviewed and apart from HPI, are negative.  Past Medical History:  Diagnosis Date  . Anemia   . Anxiety   . Asthma   . Chronic back pain   . Chronic neck pain   . Complex regional pain syndrome   . GERD (gastroesophageal reflux disease)   . Hematuria 09/07/2013  . Hypertension   . IBS (irritable bowel syndrome)   . Migraine   . Paresthesia of left arm     Past Surgical History:  Procedure Laterality Date  . DILITATION & CURRETTAGE/HYSTROSCOPY WITH THERMACHOICE ABLATION N/A 08/17/2013   Procedure: DILATATION & CURETTAGE/HYSTEROSCOPY WITH THERMACHOICE ABLATION;  Surgeon: Jonnie Kind, MD;  Location: [facility];  Service: Gynecology;  Laterality: N/A;  18 ml D5W in & out; total therapy time= 9 minutes 33 seconds; temp= 87 degrees celcius  . HERNIA REPAIR     ventral hernia  . POLYPECTOMY N/A 08/17/2013   Procedure: REMOVAL ENDOMETRIAL POLYP;  Surgeon: Jonnie Kind, MD;  Location: [facility];  Service: Gynecology;  Laterality: N/A;  . TUBAL LIGATION    . ULNAR NERVE REPAIR Left    [facility] 2013     reports that she has been  smoking cigarettes. She has a 26.00 pack-year smoking history. She has never used smokeless tobacco. She reports that she does not drink alcohol and does not use drugs.  Allergies  Allergen Reactions  . Other Other (See Comments)    IV meds for migraine, patient stated it made her feel like something was crawling all over her body.    Family History  Problem Relation Age of Onset  . Diabetes Sister   . Hypertension Sister   . Heart disease Sister   . Cancer Sister        brain tumor  . Heart disease Brother   . Diabetes Brother   . Cancer Maternal Aunt        uterine  . Cancer Maternal Grandmother        uterine  . Other Mother        car accident     Prior to Admission medications   Medication Sig Start Date End Date Taking? Authorizing Provider  ALPRAZolam Duanne Moron) 0.5 MG tablet Take 0.5 mg by mouth 3 (three) times daily as needed for anxiety. 01/07/20  Yes [provider]  cyclobenzaprine (FLEXERIL) 5 MG tablet Take 5 mg by mouth 3 (three) times daily as needed for muscle spasms. 01/07/20  Yes [provider]  divalproex (DEPAKOTE ER) 500 MG 24 hr tablet Take 500 mg by mouth daily. 03/06/18  Yes [provider]  DULoxetine (CYMBALTA) 60 MG capsule Take 1 capsule (60 mg total) by mouth daily. Patient taking differently: Take 60 mg by mouth 2 (two) times daily.  07/29/14  Yes Kirsteins, Luanna Salk, MD  gabapentin (NEURONTIN) 300 MG capsule Take 600 mg by mouth 4 (four) times daily.  05/14/16  Yes [provider]  hydrochlorothiazide (HYDRODIURIL) 50 MG tablet Take 50 mg by mouth daily. 01/18/20  Yes [provider]  lisinopril (ZESTRIL) 20 MG tablet Take 20 mg by mouth daily. 01/07/20  Yes [provider]  naloxone (NARCAN) nasal spray 4 mg/0.1 mL Place 1 spray into the nose daily as needed (overdose).   Yes [provider]  naproxen (NAPROSYN) 500 MG tablet Take 500 mg by mouth 2 (two) times daily with a meal. 02/28/18  Yes  [provider]  oxyCODONE (ROXICODONE) 15 MG immediate release tablet Take 15 mg by mouth 4 (four) times daily as needed for pain.   Yes [provider]  pantoprazole (PROTONIX) 40 MG tablet Take 1 tablet (40 mg total) by mouth 2 (two) times daily before a meal. 05/05/18  Yes Mikhail, [place], DO  promethazine (PHENERGAN) 25 MG tablet Take 25 mg by mouth every 6 (six) hours as needed for nausea or vomiting.   Yes [provider]  SYMBICORT 160-4.5 MCG/ACT inhaler Inhale 2 puffs into the lungs daily. 01/17/20  Yes [provider]  topiramate (TOPAMAX) 25 MG tablet Take 25 mg by mouth at bedtime. 11/11/19  Yes [provider]  VENTOLIN HFA 108 (90 Base) MCG/ACT inhaler Inhale 1 puff into  the lungs every 4 (four) hours as needed for shortness of breath or wheezing. 01/11/18  Yes [provider]  zolpidem (AMBIEN) 10 MG tablet Take 10 mg by mouth at bedtime as needed for sleep.   Yes [provider]  Ipratropium-Albuterol (COMBIVENT RESPIMAT) 20-100 MCG/ACT AERS respimat Inhale 1 puff into the lungs every 6 (six) hours as needed for wheezing or shortness of breath.    [provider]    Physical Exam: Vitals:   03/16/20 0204 03/16/20 0237 03/16/20 0300 03/16/20 0329  BP: 130/79 (!) 158/93 (!) 138/106 (!) 138/106  Pulse: 87 89 87 85  Resp: (!) 33 (!) 28 (!) 40 (!) 42  Temp:      TempSrc:      SpO2: 97% 97% 98% 97%  Weight:      Height:         Constitutional: Tachypneic, on BiPAP, no diaphoresis   Eyes: PERTLA, lids and conjunctivae normal ENMT: Mucous membranes are moist. Posterior pharynx clear of any exudate or lesions.   Neck: normal, supple, no masses, no thyromegaly Respiratory: Tachypnea, increased WOB, prolonged expiratory phase, scattered rhonchi. No pallor or cyanosis.  Cardiovascular: S1 & S2 heard, regular rate and rhythm. No extremity edema. Abdomen: No distension, no tenderness, soft. Bowel sounds active.   Musculoskeletal: no clubbing / cyanosis. No joint deformity upper and lower extremities.   Skin: no significant rashes, lesions, ulcers. Warm, dry, well-perfused. Neurologic: CN 2-12 grossly intact. Sensation intact. Moving all extremities.    Psychiatric: Alert and oriented to person, place, and situation. Pleasant and cooperative.    Labs and Imaging on Admission: I have personally reviewed following labs and imaging studies  CBC: Recent Labs  Lab 03/15/20 2350  WBC 12.3*  NEUTROABS 10.2*  HGB 11.9*  HCT 36.2  MCV 83.0  PLT 379   Basic Metabolic Panel: Recent Labs  Lab 03/15/20 2350  NA 138  K 3.3*  CL 96*  CO2 27  GLUCOSE 185*  BUN 10  CREATININE 0.74  CALCIUM 9.1   GFR: Estimated Creatinine Clearance: 99.2 mL/min (by C-G formula based on SCr of 0.74 mg/dL). Liver Function Tests: No results for input(s): AST, ALT, ALKPHOS, BILITOT, PROT, ALBUMIN in the last 168 hours. No results for input(s): LIPASE, AMYLASE in the last 168 hours. No results for input(s): AMMONIA in the last 168 hours. Coagulation Profile: No results for input(s): INR, PROTIME in the last 168 hours. Cardiac Enzymes: No results for input(s): CKTOTAL, CKMB, CKMBINDEX, TROPONINI in the last 168 hours. BNP (last 3 results) No results for input(s): PROBNP in the last 8760 hours. HbA1C: No results for input(s): HGBA1C in the last 72 hours. CBG: No results for input(s): GLUCAP in the last 168 hours. Lipid Profile: No results for input(s): CHOL, HDL, LDLCALC, TRIG, CHOLHDL, LDLDIRECT in the last 72 hours. Thyroid Function Tests: No results for input(s): TSH, T4TOTAL, FREET4, T3FREE, THYROIDAB in the last 72 hours. Anemia Panel: No results for input(s): VITAMINB12, FOLATE, FERRITIN, TIBC, IRON, RETICCTPCT in the last 72 hours. Urine analysis:    Component Value Date/Time   COLORURINE YELLOW 06/05/2016 0051   APPEARANCEUR CLOUDY (A) 06/05/2016 0051   LABSPEC 1.016 06/05/2016 0051   PHURINE 6.0  06/05/2016 0051   GLUCOSEU NEGATIVE 06/05/2016 0051   HGBUR NEGATIVE 06/05/2016 0051   BILIRUBINUR NEGATIVE 06/05/2016 0051   KETONESUR NEGATIVE 06/05/2016 0051   PROTEINUR NEGATIVE 06/05/2016 0051   UROBILINOGEN 1.0 07/31/2015 2028   NITRITE NEGATIVE 06/05/2016 0051   LEUKOCYTESUR TRACE (  A) 06/05/2016 0051   Sepsis Labs: @LABRCNTIP (procalcitonin:4,lacticidven:4) ) Recent Results (from the past 240 hour(s))  SARS Coronavirus 2 by RT PCR (hospital order, performed in [facility] hospital lab) Nasopharyngeal Nasopharyngeal Swab     Status: None   Collection Time: 03/15/20 11:50 PM   Specimen: Nasopharyngeal Swab  Result Value Ref Range Status   SARS Coronavirus 2 NEGATIVE NEGATIVE Final    Comment: (NOTE) SARS-CoV-2 target nucleic acids are NOT DETECTED.  The SARS-CoV-2 RNA is generally detectable in upper and lower respiratory specimens during the acute phase of infection. The lowest concentration of SARS-CoV-2 viral copies this assay can detect is 250 copies / mL. A negative result does not preclude SARS-CoV-2 infection and should not be used as the sole basis for treatment or other patient management decisions.  A negative result may occur with improper specimen collection / handling, submission of specimen other than nasopharyngeal swab, presence of viral mutation(s) within the areas targeted by this assay, and inadequate number of viral copies (<250 copies / mL). A negative result must be combined with clinical observations, patient history, and epidemiological information.  Fact Sheet for Patients:   StrictlyIdeas.no  Fact Sheet for Healthcare Providers: BankingDealers.co.za  This test is not yet approved or  cleared by the [place] FDA and has been authorized for detection and/or diagnosis of SARS-CoV-2 by FDA under an Emergency Use Authorization (EUA).  This EUA will remain in effect (meaning this test can be used)  for the duration of the COVID-19 declaration under Section 564(b)(1) of the Act, 21 U.S.C. section 360bbb-3(b)(1), unless the authorization is terminated or revoked sooner.  Performed at [facility], [address], [place], [place] [address]      Radiological Exams on Admission: CT Angio Chest PE W and/or Wo Contrast  Result Date: 03/16/2020 CLINICAL DATA:  Hypoxemia, asthma exacerbation, elevated D dimer EXAM: CT ANGIOGRAPHY CHEST WITH CONTRAST TECHNIQUE: Multidetector CT imaging of the chest was performed using the standard protocol during bolus administration of intravenous contrast. Multiplanar CT image reconstructions and MIPs were obtained to evaluate the vascular anatomy. CONTRAST:  175mL OMNIPAQUE IOHEXOL 350 MG/ML SOLN COMPARISON:  05/03/2018, 03/05/2018, 03/16/2020 FINDINGS: Cardiovascular: This is a technically adequate evaluation of the pulmonary vasculature. There are no central or segmental pulmonary emboli. Evaluation of the subsegmental branches, especially at the bases, is limited due to respiratory motion. The heart is unremarkable without pericardial effusion. Normal caliber of the thoracic aorta. Mediastinum/Nodes: Mediastinal lymphadenopathy again noted, largest lymph node in the precarinal region measuring 17 mm in short axis, not appreciably changed. The thyroid, trachea, and esophagus are unremarkable. Lungs/Pleura: There is widespread bilateral airspace disease, corresponding chest x-ray findings. There is slight sparing of the lung bases. No effusion or pneumothorax. The central airways are patent. Upper Abdomen: No acute abnormality. Musculoskeletal: There are no acute or destructive bony lesions. Reconstructed images demonstrate no additional findings. Review of the MIP images confirms the above findings. IMPRESSION: 1. No evidence of central or segmental pulmonary emboli. 2. Widespread bilateral airspace disease. Differential diagnosis would include  multifocal infection, pneumonitis, vasculitis, or less likely edema. Alveolar sarcoid could give this pattern, especially given the concomitant mediastinal lymphadenopathy. Overall, the pattern is similar to numerous prior CTs dating to 2019. Electronically Signed   By: Randa Ngo M.D.   On: 03/16/2020 02:49   DG Chest Portable 1 View  Result Date: 03/16/2020 CLINICAL DATA:  Short of breath EXAM: PORTABLE CHEST 1 VIEW COMPARISON:  01/08/2019 FINDINGS: Single frontal  view of the chest demonstrates an enlarged cardiac silhouette. There is widespread bilateral airspace disease. No large effusion or pneumothorax. No acute bony abnormality. IMPRESSION: 1. Diffuse bilateral airspace disease compatible with pulmonary edema or multifocal infection. Electronically Signed   By: Randa Ngo M.D.   On: 03/16/2020 00:18    EKG: Independently reviewed. Sinus rhythm, QTc 540 ms.   Assessment/Plan   1. Acute hypoxic respiratory failure; asthma with exacerbation; bilateral airspace disease  - Patient with hx of asthma and ILD findings on CT going back to 2017 presents with 2 days of progressive SOB, found to be in distress and hypoxic with CTA negative for PE but notable for widespread bilateral airspace disease similar to prior scans  - She is not febrile, does not have peripheral edema or BNP elevation, denies illicit substance use or hemoptysis  - COVID pcr is negative in ED  - She was treated with IV Solu-Medrol, epinephrine, IV mag, Atrovent, and albuterol prior to arrival and then BiPAP, Rocephin, azithromycin, and additional breathing treatments in ED  - Check sputum culture, procalcitonin, strep pneumo and legionella antigen, and respiratory virus panel  - Continue systemic steroid, nebs, BiPAP as needed, Rocephin, and doxycycline in place of azithro d/t prolonged QT    2. Chronic pain  - No pain complaints on admission, will continue home regimen    3. Depression, anxiety, insomnia  - Continue  Cymbalta, prn Xanax, prn Ambien   4. Prolonged QT interval  - QTc is 530 ms in ED  - Continue cardiac monitoring, replace potassium, minimize QT-prolonging medications    5. Current smoker  - She continues to smoke 1 ppd  - Counseled, nicotine patch provided    6. Hypokalemia  - Serum potassium 3.3 in ED  - Replace and repeat chem panel in am    7. Migraines  - Continue Topamax     DVT prophylaxis: Lovenox  Code Status: Full  Family Communication: Discussed with patient  Disposition Plan:  Patient is from: Hom  Anticipated d/c is to: Home  Anticipated d/c date is: 03/19/20 Patient currently: Critically-ill, requiring BiPAP  Consults called: None   Admission status: Inpatient     Vianne Bulls, MD [facility] Pager: See [email or web address]  If 7AM-7PM, please contact the daytime attending [email or web address]  03/16/2020, 3:52 AM

## 2020-03-16 NOTE — ED Notes (Signed)
Patient originally refused Bi-PAP, explained to patient and son the potential repercussions of refusal, intubation and respiratory failure. MD paged about refusal. Patient then agreed to go back on Bi-PAP.

## 2020-03-16 NOTE — Progress Notes (Signed)
Patient seen and examined personally, I reviewed the chart, history and physical and admission note, done by admitting physician this morning and agree with the same with following addendum.  Please refer to the morning admission note for more detailed plan of care.  Briefly, 54 year old female with history of asthma, currently active smoker, on home oxygen on as needed basis, chronic pain/nerve pain/migraine/depression/anxiety and Cymbalta, narcotic, obesity BMI 35 admitted with dyspnea shortness of breath with exertion occasional cough with scanty sputum production x2 days.  Seen by EMS was hypoxic in 50s on room air and in acute distress given steroids breathing treatment IV magnesium but patient refused transfer to the hospital at that time shortly after EMS left patient was unable to walk a few steps to the bathroom due to shortness of breath and called EMS again and brought to the ED. She had ILD findings on imaging and 2017, had markedly abnormal chest imaging and thousand 19 admitted with respiratory illness seen by pulmonary and planned for inpatient bronchoscopy but patient insisted on leaving prior to this and reports being followed by pulmonologist at the [facility] but never had bronchoscopy  in ED found to be afebrile, saturating mid 90s on BiPAP, tachypneic with respiratory rate as high as 40, and with blood pressure 140/100.  EKG features sinus rhythm with QTc interval 530 ms.  Chest x-ray with diffuse bilateral airspace disease that could reflect edema or infection.  CTA chest is negative for PE but concerning for widespread bilateral airspace disease that overall appears similar to prior scans and could reflect infection, pneumonitis, vasculitis, sarcoidosis, or less likely edema.  Chemistry panel features a glucose of 185 and potassium 3.3.  CBC notable for leukocytosis to 12,300.  Troponin normal x2.  BNP normal.  Lactic acid 2.4.  COVID-19 PCR is negative.  Blood cultures were  collected in the ED, patient was placed on BiPAP, and she was treated with Atrovent, albuterol, Rocephin, and azithromycin.  This morning patient refusing to put on BiPAP was transitioned to nonrebreather but is still tachypneic 30s saturating 97%, was able to speak in full sentence.  I came down and saw her urgently convinced her to be on BiPAP.  She thinks she is able to take breath deeper than yesterday, but still short of breath. Son is at the bedside. Patient continues to smoke despite family's counseling. Patient reports she was told that "I have a long of 52 year old and will die soon"  issues  Acute hypoxic respiratory failure multifactorial due to Acute exacerbation of asthma with suspected ILD, abnormal CT finding with bilateral airspace disease: No PE on the CTA but has widespread bilateral airspace disease similar to prior scans-since chronic less likely infectious process likely sarcoidosis given mediastinal adenopathy and alveolar sarcoidosis is also possible. Covid negative.  ABG on admission pH 7.4, PCO2 41, PO2 91. we will continue on current bronchodilators, ceftriaxone/doxycycline, IV Solu-Medrol, respiratory support with BiPAP.  Explained for the need to be on BiPAP explained risk of worsening respiratory failure if noncompliant.  Consulted pulmonologist and discussed in detail. At risk of decompensation will keep in stepdown.  Follow-up blood culture.  Possible pneumonia continue on empiric antibiotics.  Monitor.  Lactic acidosis likely from respiratory distress, improved from 2.4-1.9  Leukocytosis-monitor closely on empiric antibiotics.  Chronic pain on Cymbalta and narcotic at home continue as tolerated avoid for sedation  Anxiety/depression/insomnia: On Xanax, Cymbalta and Ambien continue as tolerated avoid for sedation.  Current active smoker smoking counseling provided.  Son  endorses that they are unable to convince the patient to quit.  Will continue nicotine  patch  Hypokalemia replaced.  Morbid obesity with BMI 35.  Will benefit with weight loss and healthy lifestyle.  Migraine continue Topamax DVT prophylaxis:enoxaparin (LOVENOX) injection 40 mg Start: 03/16/20 1000 Patient remains inpatient for ongoing management of respiratory failure at risk of decompensation.  Consulted pulmonology.  Patient and her son at the bedside updated.

## 2020-03-16 NOTE — ED Notes (Signed)
Called pharmacy to tube migraine meds to ED. Respiratory called and they will be down after shift change to transport.

## 2020-03-16 NOTE — ED Notes (Signed)
Pt became shob while using bedpan and pts o2sat decreased to mid-70s. This RN encouraged pt to take deep breaths and this RN increased o2 to 15L NRB. Pts o2sat now in low-90s. MD made aware and at bedside.

## 2020-03-16 NOTE — Consult Note (Signed)
NAME:  Joan [name], MRN:  [id number], DOB:  1966-09-05, LOS: 0 ADMISSION DATE:  03/15/2020, CONSULTATION DATE: 03/16/2020 REFERRING MD: Dr. Maren Beach, CHIEF COMPLAINT: Respiratory failure  Brief History   54 year old lady with a history of asthma Came into the hospital with 2-day history of worsening shortness of breath  History of present illness   History of interstitial lung disease initially noted on CT scan in 2017  Past Medical History   Past Medical History:  Diagnosis Date  . Anemia   . Anxiety   . Asthma   . Chronic back pain   . Chronic neck pain   . Complex regional pain syndrome   . GERD (gastroesophageal reflux disease)   . Hematuria 09/07/2013  . Hypertension   . IBS (irritable bowel syndrome)   . Migraine   . Paresthesia of left arm      Significant Hospital Events   Hypoxemic respiratory failure requiring BiPAP  Consults:  pccm  Procedures:  None  Significant Diagnostic Tests:  CT scan of the chest IMPRESSION: 1. No evidence of central or segmental pulmonary emboli. 2. Widespread bilateral airspace disease. Differential diagnosis would include multifocal infection, pneumonitis, vasculitis, or less likely edema. Alveolar sarcoid could give this pattern, especially given the concomitant mediastinal lymphadenopathy. Overall, the pattern is similar to numerous prior CTs dating to 2019. Micro Data:   03/16/2020 blood cultures Antimicrobials:   Azithromycin 6/30>> Ceftriaxone 6/30>> Doxycycline 6/30>> Interim history/subjective:  On BiPAP, tachypneic in the 30s 40s Able to speak in full sentences Has not any fevers or chills  Objective   Blood pressure (!) 144/88, pulse 77, temperature 98.7 F (37.1 C), temperature source Oral, resp. rate (!) 32, height 5\' 6"  (1.676 m), weight 99.8 kg, SpO2 96 %.        Intake/Output Summary (Last 24 hours) at 03/16/2020 1005 Last data filed at 03/16/2020 0827 Gross per 24 hour  Intake 1050 ml  Output --   Net 1050 ml   Filed Weights   03/15/20 2332  Weight: 99.8 kg    Examination: General: Middle-aged lady, BiPAP in place HENT: BiPAP in place Lungs: Rales at the bases, clear anteriorly Cardiovascular: S1-S2 appreciated Abdomen: Bowel sounds appreciated Extremities: No clubbing, no edema Neuro: Alert and oriented x3 GU:   Resolved Hospital Problem list     Assessment & Plan:   CT scan changes have been present since about 2017 -Unlikely that this is a recurrent infectious process -Possibility of a procedure process like sarcoidosis especially with mediastinal adenopathy, and alveolar sarcoidosis is possible -She will require invasive diagnostic process-EBUS, bronchoscopic sampling of airway and biopsy of lung tissue -Rheumatologic work-up in 2019 was negative -Hypersensitivity pneumonitis work-up was negative  Acute on chronic respiratory failure -We will continue BiPAP -Alternate with high flow oxygen if tolerated  Interstitial lung disease -We will need diagnostic evaluation -We will require bronchoscopy with EBUS -Oxygen requirement on BiPAP needed at present will preclude going ahead with bronchoscopic sampling at present -May be best served once he will be more stable  Possible pneumonia -We will continue current antibiotic therapy   obstructive lung disease -Bronchodilators  -Steroids-increase to 60 every 8 Solu-Medrol  active smoker -Smoking cessation counseling  Hypertension -Continue home medications  Paresthesia of left arm Chronic pain syndrome Complex regional pain syndrome -Continue home medication  History of depression  Risk of decompensation to requirement for ventilator does exist Will continue to follow with you  Best practice:  Diet: Per primary Pain/Anxiety/Delirium protocol (if  indicated): On oxycodone at home VAP protocol (if indicated): Not needed DVT prophylaxis: Lovenox GI prophylaxis: Primary Code Status: Full code  family  Communication: Discussed with aunt and son at bedside Disposition:   Labs   CBC: Recent Labs  Lab 03/15/20 2350 03/16/20 0500  WBC 12.3* 13.0*  NEUTROABS 10.2* 11.9*  HGB 11.9* 12.2  HCT 36.2 37.5  MCV 83.0 83.5  PLT 325 485    Basic Metabolic Panel: Recent Labs  Lab 03/15/20 2350 03/16/20 0500  NA 138 137  K 3.3* 3.9  CL 96* 98  CO2 27 27  GLUCOSE 185* 183*  BUN 10 9  CREATININE 0.74 0.63  CALCIUM 9.1 8.9  MG  --  2.1   GFR: Estimated Creatinine Clearance: 99.2 mL/min (by C-G formula based on SCr of 0.63 mg/dL). Recent Labs  Lab 03/15/20 2350 03/16/20 0243 03/16/20 0247 03/16/20 0447 03/16/20 0500  PROCALCITON  --  0.23  --   --   --   WBC 12.3*  --   --   --  13.0*  LATICACIDVEN  --   --  2.4* 1.9  --     Liver Function Tests: No results for input(s): AST, ALT, ALKPHOS, BILITOT, PROT, ALBUMIN in the last 168 hours. No results for input(s): LIPASE, AMYLASE in the last 168 hours. No results for input(s): AMMONIA in the last 168 hours.  ABG    Component Value Date/Time   PHART 7.435 03/16/2020 0305   PCO2ART 41.2 03/16/2020 0305   PO2ART 91.2 03/16/2020 0305   HCO3 27.4 03/16/2020 0305   TCO2 24 05/03/2018 0022   ACIDBASEDEF 0.2 05/03/2018 0015   O2SAT 97.3 03/16/2020 0305     Coagulation Profile: No results for input(s): INR, PROTIME in the last 168 hours.  Cardiac Enzymes: No results for input(s): CKTOTAL, CKMB, CKMBINDEX, TROPONINI in the last 168 hours.  HbA1C: No results found for: HGBA1C  CBG: No results for input(s): GLUCAP in the last 168 hours.  Review of Systems:   Shortness of breath, cough, no fever  Past Medical History  She,  has a past medical history of Anemia, Anxiety, Asthma, Chronic back pain, Chronic neck pain, Complex regional pain syndrome, GERD (gastroesophageal reflux disease), Hematuria (09/07/2013), Hypertension, IBS (irritable bowel syndrome), Migraine, and Paresthesia of left arm.   Surgical History    Past  Surgical History:  Procedure Laterality Date  . DILITATION & CURRETTAGE/HYSTROSCOPY WITH THERMACHOICE ABLATION N/A 08/17/2013   Procedure: DILATATION & CURETTAGE/HYSTEROSCOPY WITH THERMACHOICE ABLATION;  Surgeon: Jonnie Kind, MD;  Location: [facility];  Service: Gynecology;  Laterality: N/A;  18 ml D5W in & out; total therapy time= 9 minutes 33 seconds; temp= 87 degrees celcius  . HERNIA REPAIR     ventral hernia  . POLYPECTOMY N/A 08/17/2013   Procedure: REMOVAL ENDOMETRIAL POLYP;  Surgeon: Jonnie Kind, MD;  Location: [facility];  Service: Gynecology;  Laterality: N/A;  . TUBAL LIGATION    . ULNAR NERVE REPAIR Left    [facility] 2013     Social History   reports that she has been smoking cigarettes. She has a 26.00 pack-year smoking history. She has never used smokeless tobacco. She reports that she does not drink alcohol and does not use drugs.   Family History   Her family history includes Cancer in her maternal aunt, maternal grandmother, and sister; Diabetes in her brother and sister; Heart disease in her brother and sister; Hypertension in her sister; Other in her mother.   Allergies  Allergies  Allergen Reactions  . Other Other (See Comments)    IV meds for migraine, patient stated it made her feel like something was crawling all over her body.    Sherrilyn Rist, MD [facility] Pager: [phone]

## 2020-03-16 NOTE — Progress Notes (Signed)
Pt and son insisting she be taken off bipap this am. Bipap removed pt placed on NRB. Pt is labored and breathing in the 30s. Explained the importance of bipap to pt and son. RN notified.

## 2020-03-16 NOTE — Progress Notes (Signed)
eLink Physician-Brief Progress Note Patient Name: Joan [name] DOB: Apr 27, 1966 MRN: [id number]   Date of Service  03/16/2020  HPI/Events of Note  Diet request. Patient now off BiPap. See eating in bed  eICU Interventions  Diet ordered Discussed with bedside that she needs to sit upright when eating due to aspiration risk.     Intervention Category Minor Interventions: Other:  Judd Lien 03/16/2020, 8:38 PM

## 2020-03-16 NOTE — ED Notes (Signed)
Pt transported to CT ?

## 2020-03-16 NOTE — ED Notes (Signed)
ICU RN called and report given.

## 2020-03-17 LAB — CBC WITH DIFFERENTIAL/PLATELET
Abs Immature Granulocytes: 0.42 10*3/uL — ABNORMAL HIGH (ref 0.00–0.07)
Basophils Absolute: 0 10*3/uL (ref 0.0–0.1)
Basophils Relative: 0 %
Eosinophils Absolute: 0 10*3/uL (ref 0.0–0.5)
Eosinophils Relative: 0 %
HCT: 37 % (ref 36.0–46.0)
Hemoglobin: 12 g/dL (ref 12.0–15.0)
Immature Granulocytes: 2 %
Lymphocytes Relative: 6 %
Lymphs Abs: 1.3 10*3/uL (ref 0.7–4.0)
MCH: 27.6 pg (ref 26.0–34.0)
MCHC: 32.4 g/dL (ref 30.0–36.0)
MCV: 85.1 fL (ref 80.0–100.0)
Monocytes Absolute: 0.3 10*3/uL (ref 0.1–1.0)
Monocytes Relative: 2 %
Neutro Abs: 18.5 10*3/uL — ABNORMAL HIGH (ref 1.7–7.7)
Neutrophils Relative %: 90 %
Platelets: 389 10*3/uL (ref 150–400)
RBC: 4.35 MIL/uL (ref 3.87–5.11)
RDW: 17.4 % — ABNORMAL HIGH (ref 11.5–15.5)
WBC: 20.6 10*3/uL — ABNORMAL HIGH (ref 4.0–10.5)
nRBC: 0 % (ref 0.0–0.2)

## 2020-03-17 LAB — RESPIRATORY PANEL BY PCR

## 2020-03-17 LAB — LEGIONELLA PNEUMOPHILA SEROGP 1 UR AG: L. pneumophila Serogp 1 Ur Ag: NEGATIVE

## 2020-03-17 LAB — BASIC METABOLIC PANEL
Anion gap: 9 (ref 5–15)
BUN: 19 mg/dL (ref 6–20)
CO2: 25 mmol/L (ref 22–32)
Calcium: 8.8 mg/dL — ABNORMAL LOW (ref 8.9–10.3)
Chloride: 104 mmol/L (ref 98–111)
Creatinine, Ser: 0.7 mg/dL (ref 0.44–1.00)
GFR calc Af Amer: >60 mL/min (ref >60)
GFR calc non Af Amer: >60 mL/min (ref >60)
Glucose, Bld: 153 mg/dL — ABNORMAL HIGH (ref 70–99)
Potassium: 4 mmol/L (ref 3.5–5.1)
Sodium: 138 mmol/L (ref 135–145)

## 2020-03-17 LAB — HIV ANTIBODY (ROUTINE TESTING W REFLEX): HIV Screen 4th Generation wRfx: NONREACTIVE

## 2020-03-17 MED ORDER — ASPIRIN-ACETAMINOPHEN-CAFFEINE 250-250-65 MG PO TABS
2.0000 | ORAL_TABLET | Freq: Four times a day (QID) | ORAL | Status: DC | PRN
  Administered 2020-03-17 (×2): 2 via ORAL
  Filled 2020-03-17 (×4): qty 2

## 2020-03-17 MED ORDER — CEFDINIR 300 MG PO CAPS
300.0000 mg | ORAL_CAPSULE | Freq: Two times a day (BID) | ORAL | Status: DC
  Administered 2020-03-17 – 2020-03-18 (×3): 300 mg via ORAL
  Filled 2020-03-17 (×3): qty 1

## 2020-03-17 MED ORDER — LISINOPRIL 20 MG PO TABS
20.0000 mg | ORAL_TABLET | Freq: Every day | ORAL | Status: DC
  Administered 2020-03-17 – 2020-03-18 (×2): 20 mg via ORAL
  Filled 2020-03-17: qty 1
  Filled 2020-03-17: qty 2

## 2020-03-17 MED ORDER — ALBUTEROL SULFATE (2.5 MG/3ML) 0.083% IN NEBU: 3.0000 mL | INHALATION_SOLUTION | RESPIRATORY_TRACT | Status: DC | PRN

## 2020-03-17 MED ORDER — DOXYCYCLINE HYCLATE 100 MG PO TABS
100.0000 mg | ORAL_TABLET | Freq: Two times a day (BID) | ORAL | Status: DC
  Administered 2020-03-17 – 2020-03-18 (×3): 100 mg via ORAL
  Filled 2020-03-17 (×3): qty 1

## 2020-03-17 MED ORDER — HYDROCHLOROTHIAZIDE 25 MG PO TABS
50.0000 mg | ORAL_TABLET | Freq: Every day | ORAL | Status: DC
  Administered 2020-03-17 – 2020-03-18 (×2): 50 mg via ORAL
  Filled 2020-03-17 (×2): qty 2

## 2020-03-17 MED ORDER — PREDNISONE 20 MG PO TABS
40.0000 mg | ORAL_TABLET | Freq: Every day | ORAL | Status: DC
  Administered 2020-03-18: 40 mg via ORAL
  Filled 2020-03-17: qty 2

## 2020-03-17 MED ORDER — NAPROXEN 250 MG PO TABS
500.0000 mg | ORAL_TABLET | Freq: Two times a day (BID) | ORAL | Status: DC
  Administered 2020-03-17 – 2020-03-18 (×3): 500 mg via ORAL
  Filled 2020-03-17 (×4): qty 2

## 2020-03-17 NOTE — Progress Notes (Signed)
NAME:  Joan [name], MRN:  [id number], DOB:  [birth year], LOS: 1 ADMISSION DATE:  03/15/2020, CONSULTATION DATE:  03/17/2020 REFERRING MD:  Dr Maren Beach, CHIEF COMPLAINT:  Respiratory failure  Brief History   54 year old lady with a history of asthma Came into the hospital with 2-day history of worsening shortness of breath Cough, no fever History of abnormal chest x-ray/CT scan dating back to 2017 Follows up with a pulmonologist at [facility] She remains an active smoker  Past Medical History   Past Medical History:  Diagnosis Date  . Anemia   . Anxiety   . Asthma   . Chronic back pain   . Chronic neck pain   . Complex regional pain syndrome   . GERD (gastroesophageal reflux disease)   . Hematuria 09/07/2013  . Hypertension   . IBS (irritable bowel syndrome)   . Migraine   . Paresthesia of left arm    Significant Hospital Events   Hypoxemic respiratory failure requiring BiPAP  Consults:  pccm  Procedures:  None  Significant Diagnostic Tests:   CT scan of the chest IMPRESSION: 1. No evidence of central or segmental pulmonary emboli. 2. Widespread bilateral airspace disease. Differential diagnosis would include multifocal infection, pneumonitis, vasculitis, or less likely edema. Alveolar sarcoid could give this pattern, especially given the concomitant mediastinal lymphadenopathy. Overall, the pattern is similar to numerous prior CTs dating to 2019. Micro Data:  Blood culture 7/1 MRSA PCR -7/1  Antimicrobials:  Doxycycline 7/1 Rocephin 7/1   Interim history/subjective:  No overnight events Feels a little bit better Having a headache from using the high flow oxygen  Objective   Blood pressure (!) 141/96, pulse 81, temperature (!) 97.5 F (36.4 C), temperature source Axillary, resp. rate (!) 26, height 5\' 6"  (1.676 m), weight 99.8 kg, SpO2 90 %.    FiO2 (%):  [30 %-50 %] 30 %   Intake/Output Summary (Last 24 hours) at 03/17/2020 0926 Last data filed  at 03/17/2020 0600 Gross per 24 hour  Intake 343.43 ml  Output 1050 ml  Net -706.57 ml   Filed Weights   03/15/20 2332  Weight: 99.8 kg    Examination: General: Middle-aged lady, does appear comfortable HENT: Moist oral mucosa Lungs: Decreased air entry bilaterally, few rales at the bases Cardiovascular: S1-S2 appreciated with no murmur Abdomen: Bowel sounds appreciated Extremities: No clubbing, no edema Neuro: Alert and oriented x3, moving all extremities GU:   Resolved Hospital Problem list     Assessment & Plan:  Abnormal CT scan of the chest showing infiltrative process as far back as 2017 -This is unlikely a recurrent infectious process -More consistent with interstitial lung disease -Rheumatologic work-up in 2019 was negative -Hypersensitivity work-up was negative -Possibility of sarcoidosis with alveolar changes, mediastinal adenopathy -She likely will require EBUS, bronchoscopic sampling of airway and lung tissue looking for noncaseating granulomas  Acute on chronic respiratory failure -Appears more comfortable at present and may be transitioned to oxygen supplementation -We will discontinue BiPAP  Possible pneumonia -Transition to oral antibiotics -Switched to [facility] and doxycycline to complete course of treatment for possible pneumonia  Obstructive lung disease -Continue bronchodilator treatments -Switch from Solu-Medrol to prednisone 40  Active smoker -Smoking cessation counseling  Other active diagnoses include Hypertension Chronic pain syndrome Complex regional pain syndrome Paresthesia of left arm -Continue home medications   Leukocytosis likely related to steroids  Risk of decompensation still does exist however does appear slightly improved that she may be transitioned to [place]  being able to discharge by 7/3   Best practice:  Diet: Regular diet Pain/Anxiety/Delirium protocol (if indicated): Roxicodone VAP protocol (if  indicated): Not indicated DVT prophylaxis: Lovenox GI prophylaxis: Protonix Mobility: As tolerated Code Status: Full code Family Communication: Patient fully awake and aware Disposition: Stepdown unit  Labs   CBC: Recent Labs  Lab 03/15/20 2350 03/16/20 0500 03/17/20 0234  WBC 12.3* 13.0* 20.6*  NEUTROABS 10.2* 11.9* 18.5*  HGB 11.9* 12.2 12.0  HCT 36.2 37.5 37.0  MCV 83.0 83.5 85.1  PLT 325 320 093    Basic Metabolic Panel: Recent Labs  Lab 03/15/20 2350 03/16/20 0500 03/17/20 0234  NA 138 137 138  K 3.3* 3.9 4.0  CL 96* 98 104  CO2 27 27 25   GLUCOSE 185* 183* 153*  BUN 10 9 19   CREATININE 0.74 0.63 0.70  CALCIUM 9.1 8.9 8.8*  MG  --  2.1  --    GFR: Estimated Creatinine Clearance: 99.2 mL/min (by C-G formula based on SCr of 0.7 mg/dL). Recent Labs  Lab 03/15/20 2350 03/16/20 0243 03/16/20 0247 03/16/20 0447 03/16/20 0500 03/17/20 0234  PROCALCITON  --  0.23  --   --   --   --   WBC 12.3*  --   --   --  13.0* 20.6*  LATICACIDVEN  --   --  2.4* 1.9  --   --     Liver Function Tests: No results for input(s): AST, ALT, ALKPHOS, BILITOT, PROT, ALBUMIN in the last 168 hours. No results for input(s): LIPASE, AMYLASE in the last 168 hours. No results for input(s): AMMONIA in the last 168 hours.  ABG    Component Value Date/Time   PHART 7.435 03/16/2020 0305   PCO2ART 41.2 03/16/2020 0305   PO2ART 91.2 03/16/2020 0305   HCO3 27.4 03/16/2020 0305   TCO2 24 05/03/2018 0022   ACIDBASEDEF 0.2 05/03/2018 0015   O2SAT 97.3 03/16/2020 0305     Coagulation Profile: No results for input(s): INR, PROTIME in the last 168 hours.  Cardiac Enzymes: No results for input(s): CKTOTAL, CKMB, CKMBINDEX, TROPONINI in the last 168 hours.  HbA1C: No results found for: HGBA1C  CBG: No results for input(s): GLUCAP in the last 168 hours.  Review of Systems:   Improved breathing Does have a headache  Past Medical History  She,  has a past medical history of  Anemia, Anxiety, Asthma, Chronic back pain, Chronic neck pain, Complex regional pain syndrome, GERD (gastroesophageal reflux disease), Hematuria (09/07/2013), Hypertension, IBS (irritable bowel syndrome), Migraine, and Paresthesia of left arm.   Surgical History    Past Surgical History:  Procedure Laterality Date  . DILITATION & CURRETTAGE/HYSTROSCOPY WITH THERMACHOICE ABLATION N/A 08/17/2013   Procedure: DILATATION & CURETTAGE/HYSTEROSCOPY WITH THERMACHOICE ABLATION;  Surgeon: Jonnie Kind, MD;  Location: [facility];  Service: Gynecology;  Laterality: N/A;  18 ml D5W in & out; total therapy time= 9 minutes 33 seconds; temp= 87 degrees celcius  . HERNIA REPAIR     ventral hernia  . POLYPECTOMY N/A 08/17/2013   Procedure: REMOVAL ENDOMETRIAL POLYP;  Surgeon: Jonnie Kind, MD;  Location: [facility];  Service: Gynecology;  Laterality: N/A;  . TUBAL LIGATION    . ULNAR NERVE REPAIR Left    [facility] 2013     Social History   reports that she has been smoking cigarettes. She has a 26.00 pack-year smoking history. She has never used smokeless tobacco. She reports that she does not drink alcohol and does not  use drugs.   Family History   Her family history includes Cancer in her maternal aunt, maternal grandmother, and sister; Diabetes in her brother and sister; Heart disease in her brother and sister; Hypertension in her sister; Other in her mother.   Allergies Allergies  Allergen Reactions  . Other Other (See Comments)    IV meds for migraine, patient stated it made her feel like something was crawling all over her body.    Sherrilyn Rist, MD [place] Pager: [phone]

## 2020-03-17 NOTE — TOC Progression Note (Signed)
Transition of Care [facility]) - Progression Note    Patient Details  Name: Joan [name] MRN: [id number] Date of Birth: 10/17/1965  [facility] CM/SW Contact  Leeroy Cha, RN Phone Number: 03/17/2020, 8:52 AM  Clinical Narrative:    hfnc at 50l/min, iv solu-medrol, Rocephin, iv vibramycin, wbc 209.6 Plan following for progression and toc dc needs.   Expected Discharge Plan: Home/Self Care Barriers to Discharge: Continued Medical Work up  Expected Discharge Plan and Services Expected Discharge Plan: Home/Self Care   Discharge Planning Services: CM Consult   Living arrangements for the past 2 months: Apartment                                       Social Determinants of Health (SDOH) Interventions    Readmission Risk Interventions No flowsheet data found.

## 2020-03-17 NOTE — Progress Notes (Signed)
Gave pt dulera inhaler this am. Pt states she does albuterol inhaler prn at home. I explained she has prn nebulizers her as needed. She is upset it is not an inhaler. I told her I would contact the md to ask if she could have an inhaler instead of the prn neb. She became frustrated and said just leave then nebulizer. I asked if she wanted to take an albuterol neb now she said No. I explained to her if she felt like she needed her albuterol anytime today to let her RN know. She said ok.

## 2020-03-17 NOTE — Progress Notes (Signed)
Pt requested that her son be made primary contact

## 2020-03-17 NOTE — Progress Notes (Signed)
PROGRESS NOTE    Joan [name]  GBT:[id number] DOB: March 03, 1966 DOA: 03/15/2020 PCP: Larina Earthly, MD   Chef Complaints: shortness of breath  Brief Narrative: 54 year old female with history of asthma, currently active smoker, on home oxygen on as needed basis, chronic pain/nerve pain/migraine/depression/anxiety and Cymbalta, narcotic, obesity BMI 35 admitted with dyspnea shortness of breath with exertion occasional cough with scanty sputum production x2 days.  Seen by EMS was hypoxic in 50s on room air and in acute distress given steroids breathing treatment IV magnesium but patient refused transfer to the hospital at that time shortly after EMS left patient was unable to walk a few steps to the bathroom due to shortness of breath and called EMS again and brought to the ED. She had ILD findings on imaging and 2017, had markedly abnormal chest imaging and thousand 19 admitted with respiratory illness seen by pulmonary and planned for inpatient bronchoscopy but patient insisted on leaving prior to this and reports being followed by pulmonologist at the [facility] but never had bronchoscopy in ED found to be afebrile, saturating mid 90s on BiPAP, tachypneic with respiratory rate as high as 40, and with blood pressure 140/100. EKG features sinus rhythm with QTc interval 530 ms. Chest x-ray with diffuse bilateral airspace disease that could reflect edema or infection. CTA chest is negative for PE but concerning for widespread bilateral airspace disease that overall appears similar to prior scans and could reflect infection, pneumonitis, vasculitis, sarcoidosis, or less likely edema. Chemistry panel features a glucose of 185 and potassium 3.3. CBC notable for leukocytosis to 12,300. Troponin normal x2. BNP normal. Lactic acid 2.4. COVID-19 PCR is negative. Blood cultures were collected in the ED, patient was placed on BiPAP, and she was treated with Atrovent, albuterol, Rocephin, and  azithromycin. Initially on BiPAP subsequently switched to high flow nasal cannula, treated with IV antibiotics IV steroids, seen by pulmonary.   Subjective:  " I want to go home"  I am not getting my medicine, I take 2 excedenrin at home. 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 Covid negative.  ABG on admission pH 7.4, PCO2 41, PO2 91.   Seen by pulmonary appreciate input will likely require EBUS, bronchoscopy sampling of airway and lung tissue for noncaseating granulomas at some point.  On high flow nasal cannula being switched to regular nasal cannula and will likely need home oxygen.  Consult case Freight forwarder. Off  BiPAP since yesterday evening.  Switching to p.o. prednisone and oral Augmentin  Possible pneumonia continue on empiric antibiotics.  Switching to p.o.  Per pulmonary.  Obstructive lung disease continue bronchodilator switching to prednisone.  Lactic acidosis likely from respiratory distress, improved from 2.4-1.9  Leukocytosis-worsening of WBC count likely from steroid.  Switching to p.o.  She is afebrile.    Chronic pain on Cymbalta and narcotic at home continue home regimen as tolerated.   Anxiety/depression/insomnia: On Xanax, Cymbalta and Ambien continue home regimen as tolerated.  Current active smoker smoking -patient has   been counseled.  Continue nicotine patch.    Hypokalemia replaced.  Morbid obesity with BMI 35.   Will benefit with weight loss and healthy lifestyle.  Migraine continue Topamax patient 1.  Need to resume her NSAIDs as well as Excedrin 2 tabs and continue the same regimen.  DVT prophylaxis: enoxaparin (LOVENOX) injection 40 mg Start: 03/16/20 1000 Code Status: FULLFamily Communication: plan of care discussed with patient at bedside.  Status is: Inpatient  Remains inpatient appropriate because:IV treatments appropriate due to intensity of illness or inability to take PO and Inpatient level of care appropriate due to severity of illness   Dispo: The patient is from: Home              Anticipated d/c is to: Home              Anticipated d/c date is:1 days              Patient currently is not medically stable to d/c.  Anticipate discharge tomorrow if respiratory status stable.  Will need home oxygen.  Discussed with pulmonology.  Per PCCM okay to transfer to [facility].  Nutrition: Diet Order            Diet 2 gram sodium Room service appropriate? Yes; Fluid consistency: Thin  Diet effective now                       Body mass index is 35.51 kg/m.  Consultants: PCCM Procedures:see note Microbiology:see note  Medications: Scheduled Meds: . Chlorhexidine Gluconate Cloth  6 each Topical Daily  . divalproex  500 mg Oral Daily  . DULoxetine  60 mg Oral BID  . enoxaparin (LOVENOX) injection  40 mg Subcutaneous Q24H  . gabapentin  600 mg Oral QID  . hydrochlorothiazide  50 mg Oral Daily  . lisinopril  20 mg Oral Daily  . mouth rinse  15 mL Mouth Rinse BID  . methylPREDNISolone (SOLU-MEDROL) injection  60 mg Intravenous Q8H  . mometasone-formoterol  2 puff Inhalation BID  . naproxen  500 mg Oral BID WC  . nicotine  21 mg Transdermal Daily  . pantoprazole  40 mg Oral BID AC  . topiramate  25 mg Oral QHS   Continuous Infusions: . sodium chloride    . cefTRIAXone (ROCEPHIN)  IV Stopped (03/17/20 0544)  . doxycycline (VIBRAMYCIN) IV Stopped (03/16/20 2312)     Antimicrobials: Anti-infectives (From admission, onward)   Start     Dose/Rate Route Frequency Ordered Stop   03/17/20 0600  cefTRIAXone (ROCEPHIN) 2 g in sodium chloride 0.9 % 100 mL IVPB     Discontinue     2 g 200 mL/hr over 30 Minutes Intravenous Every 24 hours 03/16/20 0330 03/21/20 0559   03/17/20 0600  azithromycin (ZITHROMAX) 500 mg in sodium chloride 0.9 % 250 mL IVPB  Status:  Discontinued        500 mg 250 mL/hr over 60 Minutes Intravenous Every 24 hours 03/16/20 0330 03/16/20 0352   03/16/20 0415  doxycycline (VIBRAMYCIN) 100 mg in sodium chloride 0.9 % 250 mL IVPB     Discontinue     100 mg 125 mL/hr over 120 Minutes Intravenous 2 times daily 03/16/20 0352     03/16/20 0345  cefTRIAXone (ROCEPHIN) 2 g in sodium chloride 0.9 % 100 mL IVPB        2 g 200 mL/hr over 30 Minutes Intravenous  Once 03/16/20 0343 03/16/20 0438   03/16/20 0330  cefTRIAXone (ROCEPHIN) 1  g in sodium chloride 0.9 % 100 mL IVPB  Status:  Discontinued        1 g 200 mL/hr over 30 Minutes Intravenous  Once 03/16/20 0325 03/16/20 0343   03/16/20 0330  azithromycin (ZITHROMAX) 500 mg in sodium chloride 0.9 % 250 mL IVPB        500 mg 250 mL/hr over 60 Minutes Intravenous  Once 03/16/20 0325 03/16/20 0547       Objective: Vitals: Today's Vitals   03/17/20 0600 03/17/20 0741 03/17/20 0800 03/17/20 0801  BP: (!) 110/52  (!) 141/96   Pulse: 82 79 81   Resp: (!) 21 19 (!) 26   Temp:    (!) 97.5 F (36.4 C)  TempSrc:    Axillary  SpO2: 92% 93% 90%   Weight:      Height:      PainSc:        Intake/Output Summary (Last 24 hours) at 03/17/2020 0806 Last data filed at 03/17/2020 0600 Gross per 24 hour  Intake 593.43 ml  Output 1050 ml  Net -456.57 ml   Filed Weights   03/15/20 2332  Weight: 99.8 kg   Weight change:    Intake/Output from previous day: 07/01 0701 - 07/02 0700 In: 593.4 [IV Piggyback:593.4] Out: 1050 [Urine:1050] Intake/Output this shift: No intake/output data  recorded.  Examination:  General exam: AAOx3, obese, able to speak full sentence,NAD, weak appearing. HEENT:Oral mucosa moist, Ear/Nose WNL grossly,dentition normal. Respiratory system: bilaterally air entry + but with end inspiratory and end expiratory wheezes,no use of accessory muscle, non tender. Cardiovascular system: S1 & S2 +, regular, No JVD. Gastrointestinal system: Abdomen soft, NT,ND, BS+. Nervous System:Alert, awake, moving extremities and grossly nonfocal Extremities: No edema, distal peripheral pulses palpable.  Skin: No rashes,no icterus. MSK: Normal muscle bulk,tone, power  Data Reviewed: I have personally reviewed following labs and imaging studies CBC: Recent Labs  Lab 03/15/20 2350 03/16/20 0500 03/17/20 0234  WBC 12.3* 13.0* 20.6*  NEUTROABS 10.2* 11.9* 18.5*  HGB 11.9* 12.2 12.0  HCT 36.2 37.5 37.0  MCV 83.0 83.5 85.1  PLT 325 320 106   Basic Metabolic Panel: Recent Labs  Lab 03/15/20 2350 03/16/20 0500 03/17/20 0234  NA 138 137 138  K 3.3* 3.9 4.0  CL 96* 98 104  CO2 27 27 25   GLUCOSE 185* 183* 153*  BUN 10 9 19   CREATININE 0.74 0.63 0.70  CALCIUM 9.1 8.9 8.8*  MG  --  2.1  --    GFR: Estimated Creatinine Clearance: 99.2 mL/min (by C-G formula based on SCr of 0.7 mg/dL). Liver Function Tests: No results for input(s): AST, ALT, ALKPHOS, BILITOT, PROT, ALBUMIN in the last 168 hours. No results for input(s): LIPASE, AMYLASE in the last 168 hours. No results for input(s): AMMONIA in the last 168 hours. Coagulation Profile: No results for input(s): INR, PROTIME in the last 168 hours. Cardiac Enzymes: No results for input(s): CKTOTAL, CKMB, CKMBINDEX, TROPONINI in the last 168 hours. BNP (last 3 results) No results for input(s): PROBNP in the last 8760 hours. HbA1C: No results for input(s): HGBA1C in the last 72 hours. CBG: No results for input(s): GLUCAP in the last 168 hours. Lipid Profile: No results for input(s): CHOL, HDL, LDLCALC,  TRIG, CHOLHDL, LDLDIRECT in the last 72 hours. Thyroid Function Tests: No results for input(s): TSH, T4TOTAL, FREET4, T3FREE, THYROIDAB in the last 72 hours. Anemia Panel: No results for input(s): VITAMINB12, FOLATE, FERRITIN, TIBC, IRON, RETICCTPCT in the last 72 hours.  Sepsis Labs: Recent Labs  Lab 03/16/20 0243 03/16/20 0247 03/16/20 0447  PROCALCITON 0.23  --   --   LATICACIDVEN  --  2.4* 1.9    Recent Results (from the past 240 hour(s))  SARS Coronavirus 2 by RT PCR (hospital order, performed in [facility] hospital lab) Nasopharyngeal Nasopharyngeal Swab     Status: None   Collection Time: 03/15/20 11:50 PM   Specimen: Nasopharyngeal Swab  Result Value Ref Range Status   SARS Coronavirus 2 NEGATIVE NEGATIVE Final    Comment: (NOTE) SARS-CoV-2 target nucleic acids are NOT DETECTED.  The SARS-CoV-2 RNA is generally detectable in upper and lower respiratory specimens during the acute phase of infection. The lowest concentration of SARS-CoV-2 viral copies this assay can detect is 250 copies / mL. A negative result does not preclude SARS-CoV-2 infection and should not be used as the sole basis for treatment or other patient management decisions.  A negative result may occur with improper specimen collection / handling, submission of specimen other than nasopharyngeal swab, presence of viral mutation(s) within the areas targeted by this assay, and inadequate number of viral copies (<250 copies / mL). A negative result must be combined with clinical observations, patient history, and epidemiological information.  Fact Sheet for Patients:   StrictlyIdeas.no  Fact Sheet for Healthcare Providers: BankingDealers.co.za  This test is not yet approved or  cleared by the [place] FDA and has been authorized for detection and/or diagnosis of SARS-CoV-2 by FDA under an Emergency Use Authorization (EUA).  This EUA will remain in  effect (meaning this test can be used) for the duration of the COVID-19 declaration under Section 564(b)(1) of the Act, 21 U.S.C. section 360bbb-3(b)(1), unless the authorization is terminated or revoked sooner.  Performed at [facility], [place] [address], [place], [place] [address]   Blood culture (routine x 2)     Status: None (Preliminary result)   Collection Time: 03/16/20  2:47 AM   Specimen: BLOOD  Result Value Ref Range Status   Specimen Description   Final    BLOOD RIGHT ANTECUBITAL Performed at [facility] [address], [place], [place] [address]    Special Requests   Final    BOTTLES DRAWN AEROBIC AND ANAEROBIC Blood Culture adequate volume Performed at [facility] [address] [place], [place] [address]    Culture   Final    NO GROWTH < 24 HOURS Performed at [facility] [address], [place], [place] [address]    Report Status PENDING  Incomplete  Blood culture (routine x 2)     Status: None (Preliminary result)   Collection Time: 03/16/20  3:42 AM   Specimen: BLOOD  Result Value Ref Range Status   Specimen Description   Final    BLOOD BLOOD LEFT HAND Performed at [facility] [address], [place], [place] [address]    Special Requests   Final    BOTTLES DRAWN AEROBIC AND ANAEROBIC Blood Culture results may not be optimal due to an inadequate volume of blood received in culture bottles Performed at [facility] [address] [place], [place] [address]    Culture   Final    NO GROWTH < 24 HOURS Performed at [facility] [address], [place], [place] [address]    Report Status PENDING  Incomplete  MRSA PCR Screening     Status: None   Collection Time: 03/16/20  7:54 PM   Specimen: Nasal Mucosa; Nasopharyngeal  Result Value Ref Range Status   MRSA by PCR NEGATIVE NEGATIVE Final    Comment:        The GeneXpert MRSA Assay  (FDA approved for NASAL specimens only), is one component of a comprehensive MRSA colonization surveillance program. It is not intended to diagnose MRSA infection nor to guide or monitor treatment for MRSA infections. Performed at [facility], [place] [address] [place], [place] [address]       Radiology Studies: CT Angio Chest PE W and/or Wo Contrast  Result Date: 03/16/2020 CLINICAL DATA:  Hypoxemia, asthma exacerbation, elevated D dimer EXAM: CT ANGIOGRAPHY CHEST WITH CONTRAST TECHNIQUE: Multidetector CT imaging of the chest was performed using the standard protocol during bolus administration of intravenous contrast. Multiplanar CT image reconstructions and MIPs were obtained to evaluate the vascular anatomy. CONTRAST:  133mL OMNIPAQUE IOHEXOL 350 MG/ML SOLN COMPARISON:  05/03/2018, 03/05/2018, 03/16/2020 FINDINGS: Cardiovascular: This is a technically adequate evaluation of the pulmonary vasculature. There are no central or segmental pulmonary emboli. Evaluation of the subsegmental branches, especially at the bases, is limited due to respiratory motion. The heart is unremarkable without pericardial effusion. Normal caliber of the thoracic aorta. Mediastinum/Nodes: Mediastinal lymphadenopathy again noted, largest lymph node in the precarinal region measuring 17 mm in short axis, not appreciably changed. The thyroid, trachea, and esophagus are unremarkable. Lungs/Pleura: There is widespread bilateral airspace disease, corresponding chest x-ray findings. There is slight sparing of the lung bases. No effusion or pneumothorax. The central airways are patent. Upper Abdomen: No acute abnormality. Musculoskeletal: There are no acute or destructive bony lesions. Reconstructed images demonstrate no additional findings. Review of the MIP images confirms the above findings. IMPRESSION: 1. No evidence of central or segmental pulmonary emboli. 2. Widespread bilateral airspace disease.  Differential diagnosis would include multifocal infection, pneumonitis, vasculitis, or less likely edema. Alveolar sarcoid could give this pattern, especially given the concomitant mediastinal lymphadenopathy. Overall, the pattern is similar to numerous prior CTs dating to 2019. Electronically Signed   By: Randa Ngo M.D.   On: 03/16/2020 02:49   DG Chest Portable 1 View  Result Date: 03/16/2020 CLINICAL DATA:  Short of breath EXAM: PORTABLE CHEST 1 VIEW COMPARISON:  01/08/2019 FINDINGS: Single frontal view of the chest demonstrates an enlarged cardiac silhouette. There is widespread bilateral airspace disease. No large effusion or pneumothorax. No acute bony abnormality. IMPRESSION: 1. Diffuse bilateral airspace disease compatible with pulmonary edema or multifocal infection. Electronically Signed   By: Randa Ngo M.D.   On: 03/16/2020 00:18     LOS: 1 day   Antonieta Pert, MD [facility]  03/17/2020, 8:06 AM

## 2020-03-17 NOTE — Progress Notes (Signed)
Patient upset about not getting to smoke a cigarette this RN educated patient about why patient could not go outside to smoke. RN offered to page the MD about getting a nicotine patch but patient refused stating that nicotine patches do not work for her.

## 2020-03-18 ENCOUNTER — Other Ambulatory Visit: Payer: Medicare Other

## 2020-03-18 LAB — CBC WITH DIFFERENTIAL/PLATELET
Abs Immature Granulocytes: 0.45 10*3/uL — ABNORMAL HIGH (ref 0.00–0.07)
Basophils Absolute: 0.1 10*3/uL (ref 0.0–0.1)
Basophils Relative: 0 %
Eosinophils Absolute: 0 10*3/uL (ref 0.0–0.5)
Eosinophils Relative: 0 %
HCT: 38.2 % (ref 36.0–46.0)
Hemoglobin: 12.1 g/dL (ref 12.0–15.0)
Immature Granulocytes: 3 %
Lymphocytes Relative: 24 %
Lymphs Abs: 3.6 10*3/uL (ref 0.7–4.0)
MCH: 27.1 pg (ref 26.0–34.0)
MCHC: 31.7 g/dL (ref 30.0–36.0)
MCV: 85.5 fL (ref 80.0–100.0)
Monocytes Absolute: 0.6 10*3/uL (ref 0.1–1.0)
Monocytes Relative: 4 %
Neutro Abs: 10.5 10*3/uL — ABNORMAL HIGH (ref 1.7–7.7)
Neutrophils Relative %: 69 %
Platelets: 382 10*3/uL (ref 150–400)
RBC: 4.47 MIL/uL (ref 3.87–5.11)
RDW: 17.3 % — ABNORMAL HIGH (ref 11.5–15.5)
WBC: 15.2 10*3/uL — ABNORMAL HIGH (ref 4.0–10.5)
nRBC: 0.2 % (ref 0.0–0.2)

## 2020-03-18 LAB — BASIC METABOLIC PANEL
Anion gap: 9 (ref 5–15)
BUN: 22 mg/dL — ABNORMAL HIGH (ref 6–20)
CO2: 28 mmol/L (ref 22–32)
Calcium: 9.1 mg/dL (ref 8.9–10.3)
Chloride: 105 mmol/L (ref 98–111)
Creatinine, Ser: 0.67 mg/dL (ref 0.44–1.00)
GFR calc Af Amer: >60 mL/min (ref >60)
GFR calc non Af Amer: >60 mL/min (ref >60)
Glucose, Bld: 132 mg/dL — ABNORMAL HIGH (ref 70–99)
Potassium: 3.2 mmol/L — ABNORMAL LOW (ref 3.5–5.1)
Sodium: 142 mmol/L (ref 135–145)

## 2020-03-18 MED ORDER — PREDNISONE 10 MG PO TABS
ORAL_TABLET | ORAL | 0 refills | Status: DC
Start: 2020-03-18 — End: 2021-02-20

## 2020-03-18 MED ORDER — CEFDINIR 300 MG PO CAPS: 300.0000 mg | ORAL_CAPSULE | Freq: Two times a day (BID) | ORAL | 0 refills | Status: AC

## 2020-03-18 MED ORDER — POTASSIUM CHLORIDE CRYS ER 20 MEQ PO TBCR
40.0000 meq | EXTENDED_RELEASE_TABLET | Freq: Once | ORAL | Status: AC
  Administered 2020-03-18: 40 meq via ORAL
  Filled 2020-03-18: qty 2

## 2020-03-18 MED ORDER — ALBUTEROL SULFATE (2.5 MG/3ML) 0.083% IN NEBU
2.5000 mg | INHALATION_SOLUTION | Freq: Four times a day (QID) | RESPIRATORY_TRACT | 0 refills | Status: AC | PRN
Start: 2020-03-18 — End: 2022-01-07

## 2020-03-18 MED ORDER — COMBIVENT RESPIMAT 20-100 MCG/ACT IN AERS: 1.0000 | INHALATION_SPRAY | Freq: Four times a day (QID) | RESPIRATORY_TRACT | 1 refills | Status: DC | PRN

## 2020-03-18 MED ORDER — DOXYCYCLINE HYCLATE 100 MG PO TABS: 100.0000 mg | ORAL_TABLET | Freq: Two times a day (BID) | ORAL | 0 refills | Status: AC

## 2020-03-18 NOTE — Progress Notes (Signed)
RN went to patient's room, she was not there. Informed from other RN that patient and her husband went off the unit. When patient returned to unit this RN explained to patient that she doesn't have permission to go off the unit and RN explained that visiting hours are over and that visitor had to leave, visitor said he would leave when he finished his food and asked to speak to [facility].

## 2020-03-18 NOTE — Progress Notes (Signed)
NAME:  Joan [name], MRN:  [id number], DOB:  1966/09/10, LOS: 2 ADMISSION DATE:  03/15/2020, CONSULTATION DATE:  7/2 REFERRING MD:  Maren Beach, CHIEF COMPLAINT:  Dyspnea   Brief History   54 y/o smoker with asthma admitted with dyspnea admitted by [facility] and seen by pulmonary for bilateral infiltrates which appear chronic.  Suspect ILD.  Past Medical History   Past Medical History:  Diagnosis Date  . Anemia   . Anxiety   . Asthma   . Chronic back pain   . Chronic neck pain   . Complex regional pain syndrome   . GERD (gastroesophageal reflux disease)   . Hematuria 09/07/2013  . Hypertension   . IBS (irritable bowel syndrome)   . Migraine   . Paresthesia of left arm     Significant Hospital Events   7/2 required BIPAP on admission  Consults:  PCCM  Procedures:  none  Significant Diagnostic Tests:  7/1 CT Angiogram chest > severe bilateral airspace disease in an upper lobe predominant pattern with associated mediastinal adenopathy.   Micro Data:  6/30 SARS COV 2 >  7/1 blood culture >  7/2 respiratory viral panel >   Antimicrobials:  6/30 azithro x1 6/30 ceftriaxone > 7/1 6/30 doxycycline >  72 cefdinir >   Interim history/subjective:  Feels much better Wants to go home Cough improved No leg swelling  Objective   Blood pressure 126/76, pulse 73, temperature 98.1 F (36.7 C), temperature source Oral, resp. rate 16, height _0  (1.676 m), weight 99.8 kg, SpO2 93 %.        Intake/Output Summary (Last 24 hours) at 03/18/2020 0820 Last data filed at 03/18/2020 0600 Gross per 24 hour  Intake 300 ml  Output 0 ml  Net 300 ml   Filed Weights   03/15/20 2332  Weight: 99.8 kg    Examination:  General:  Sitting up on side of bed HENT: NCAT OP clear PULM: Crackles upper lobes, otherwise clear, normal effort CV: RRR, no mgr GI: BS+, soft, nontender MSK: normal bulk and tone Neuro: awake, alert, no distress, MAEW   Resolved Hospital Problem list      Assessment & Plan:  Acute hypoxemic respiratory failure in a smoker with diffuse bilateral infiltrates and mediastinal adenopathy.  Broad ddx includes sarcoidosis, respiratory bronchiolitis ILD, desquamative interstitial pneumonitis, less likely hypersensitivity pneumonitis, eosinophilic pneumonitis or organizing pneumonia or NSIP.  Less likely malignancy.  Needs bronchoscopy with biopsy, does not want to wait for 3 days in hospital for bronchoscopy. Discharge prednisone 27m daily until seen in pulmonary clinic Have contacted our office for pulmonary follow up, [facility] Will need EBUS bronchoscopy for mediastinal lymph node sampling at some point in addition to BAL Walk here in hospital to determine home O2 needs Needs 2L O2 at rest    Best practice:   Per [facility]  Labs   CBC: Recent Labs  Lab 03/15/20 2350 03/16/20 0500 03/17/20 0234 03/18/20 0414  WBC 12.3* 13.0* 20.6* 15.2*  NEUTROABS 10.2* 11.9* 18.5* 10.5*  HGB 11.9* 12.2 12.0 12.1  HCT 36.2 37.5 37.0 38.2  MCV 83.0 83.5 85.1 85.5  PLT 325 320 389 3130   Basic Metabolic Panel: Recent Labs  Lab 03/15/20 2350 03/16/20 0500 03/17/20 0234 03/18/20 0414  NA 138 137 138 142  K 3.3* 3.9 4.0 3.2*  CL 96* 98 104 105  CO2 _1 GLUCOSE 185* 183* 153*  132*  BUN _0 22*  CREATININE 0.74 0.63 0.70 0.67  CALCIUM 9.1 8.9 8.8* 9.1  MG  --  2.1  --   --    GFR: Estimated Creatinine Clearance: 99.2 mL/min (by C-G formula based on SCr of 0.67 mg/dL). Recent Labs  Lab 03/15/20 2350 03/16/20 0243 03/16/20 0247 03/16/20 0447 03/16/20 0500 03/17/20 0234 03/18/20 0414  PROCALCITON  --  0.23  --   --   --   --   --   WBC 12.3*  --   --   --  13.0* 20.6* 15.2*  LATICACIDVEN  --   --  2.4* 1.9  --   --   --     Liver Function Tests: No results for input(s): AST, ALT, ALKPHOS, BILITOT, PROT, ALBUMIN in the last 168 hours. No results for input(s): LIPASE, AMYLASE in  the last 168 hours. No results for input(s): AMMONIA in the last 168 hours.  ABG    Component Value Date/Time   PHART 7.435 03/16/2020 0305   PCO2ART 41.2 03/16/2020 0305   PO2ART 91.2 03/16/2020 0305   HCO3 27.4 03/16/2020 0305   TCO2 24 05/03/2018 0022   ACIDBASEDEF 0.2 05/03/2018 0015   O2SAT 97.3 03/16/2020 0305     Coagulation Profile: No results for input(s): INR, PROTIME in the last 168 hours.  Cardiac Enzymes: No results for input(s): CKTOTAL, CKMB, CKMBINDEX, TROPONINI in the last 168 hours.  HbA1C: No results found for: HGBA1C  CBG: No results for input(s): GLUCAP in the last 168 hours.    Roselie Awkward, MD [facility] Pager: [phone] Cell: [phone] If no response, call [phone]

## 2020-03-18 NOTE — Progress Notes (Signed)
SATURATION QUALIFICATIONS: (This note is used to comply with regulatory documentation for home oxygen)  Patient Saturations on Room Air at Rest = 92%  Patient Saturations on Room Air while Ambulating 82%  Patient Saturations on 2Liters of oxygen while Ambulating = 92-97%  Please briefly explain why patient needs home oxygen:

## 2020-03-18 NOTE — Progress Notes (Signed)
CM received call from bedside RN stating patient was getting on the elevator and refusing to wait for oxygen set-up.  Patient made mention to RN that she had a big oxygen tank at home from [facility], however patient did not have a portable O2 tank with her and refused to wait for one.  CM reached out to [place] to confirm if patient was active with agency for home oxygen.  If patient is not currently active, CM requested oxygen set up for home.  Unfortunately this patient did not wish to wait for the oxygen to be delivered prior to leaving.

## 2020-03-18 NOTE — Discharge Summary (Signed)
Physician Discharge Summary  MARENDA ACCARDI ZHY:[id number] DOB: 05-Nov-1965 DOA: 03/15/2020  PCP: Larina Earthly, MD  Admit date: 03/15/2020 Discharge date: 03/18/2020  Admitted From: home Disposition:  home  Recommendations for Outpatient Follow-up:  1. Follow up with PCP in 1-2 weeks 2. Please obtain BMP/CBC in one week 3. Please follow up on the following pending results:  Home Health:no  Equipment/Devices: none  Discharge Condition: Stable Code Status: full Diet recommendation:  Diet Order            Diet - low sodium heart healthy           Diet regular Room service appropriate? Yes; Fluid consistency: Thin  Diet effective now                  Brief/Interim Summary: 54 year old female with history of asthma, currentlyactive smoker,on home oxygen on as needed basis, chronic pain/nerve pain/migraine/depression/anxiety and Cymbalta, narcotic, obesity BMI 35 admitted with dyspnea shortness of breath with exertion occasional cough with scanty sputum production x2 days. Seen by EMS was hypoxic in 50s on room air and in acute distress given steroids breathing treatment IV magnesium but patient refused transfer to the hospital at that time shortly after EMS left patient was unable to walk a few steps to the bathroom due to shortness of breath and called EMS again and brought to the ED. She had ILD findings on imaging and 2017, had markedly abnormal chest imaging and thousand 19 admitted with respiratory illness seen by pulmonary and plannedfor inpatient bronchoscopy but patient insisted on leaving prior to this and reports being followed by pulmonologist at the [facility] but never had bronchoscopy in [place] to be afebrile, saturating mid 90s on BiPAP, tachypneic with respiratory rate as high as 40, and with blood pressure 140/100. EKG features sinus rhythm with QTc interval 530 ms. Chest x-ray with diffuse bilateral airspace disease that could reflect edema or  infection. CTA chest is negative for PE but concerning for widespread bilateral airspace disease that overall appears similar to prior scans and could reflect infection, pneumonitis, vasculitis, sarcoidosis, or less likely edema. Chemistry panel features a glucose of 185 and potassium 3.3. CBC notable for leukocytosis to 12,300. Troponin normal x2. BNP normal. Lactic acid 2.4. COVID-19 PCR is negative. Blood cultures were collected in the ED, patient was placed on BiPAP, and she was treated with Atrovent, albuterol, Rocephin, and azithromycin. Initially on BiPAP subsequently switched to high flow nasal cannula, treated with IV antibiotics IV steroids, seen by pulmonary. Patient was managed with IV antibiotics IV steroids subsequently weaned off BiPAP to high flow nasal cannula and to regular nasal cannula.  At this time antibiotics and steroid has been switched to p.o.  She has been ambulating not on oxygen at rest but needing oxygen on ambulation.She feels well and requesting to be discharged home today.  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 ABG on admission pH 7.4, PCO2 41, PO2 91.  Seen by pulmonary appreciate input will likely require EBUS, bronchoscopy sampling  of airway and lung tissue for noncaseating granulomas at some point.  Weaned off oxygen at rest.  Needing oxygen ambulation 2 L.  We will set up home oxygen.  We  will switch her to oral antibiotics Omnicef and doxycycline and tapering dose of steroid.  She will need to follow-up with her pulmonology for further work-up as instructed by pulmonary.  I discussed with pulmonary Dr [place] Bells and okay for discharge home at this morning.  Possible pneumonia continue on empiric antibiotics.  Continue oral antibiotics as instructed ordered Obstructive lung disease continue bronchodilator switching to prednisone.  Lactic acidosis likely from respiratory distress, improved from 2.4-1.9  Leukocytosis-worsening of WBC count likely from steroid.  Downtrending.  Continue oral antibiotics.  Chronic pain on Cymbalta and narcotic at home continue home regimen as tolerated.   Anxiety/depression/insomnia:On Xanax, Cymbalta and Ambien  continue home regimen.  Current active smoker smoking -patient has  been counseled.  Continue nicotine patch.    Hypokalemia replaced.  Morbid obesity with BMI 35. Will benefit with weight loss and healthy lifestyle.  Migraine-stable continue home meds.   Consults:  PCCM  Subjective: Just came from restroom. Denies any nausea vomiting chest pain or shortness of breath.  Asking when she is getting discharged today. We checked her oxygen after she came back was saturating well on room air at 94%. Patient ambulated in the hallway without any issues.  She was however needing to 2l nasal cannula on ambulation Discharge Exam: Vitals:   03/18/20 0528 03/18/20 0626  BP: 126/76   Pulse: 73   Resp: 16   Temp: 98.1 F (36.7 C)   SpO2: (!) 87% 93%   General: Pt is alert, awake, not in acute distress Cardiovascular: RRR, S1/S2 +, no rubs, no gallops Respiratory: Diminished bilaterally no wheezing, no rhonchi Abdominal: Soft, NT, ND, bowel sounds + Extremities: no edema, no cyanosis  Discharge Instructions  Discharge Instructions    Diet - low sodium heart healthy   Complete by: As directed    Discharge instructions    Complete by: As directed    Please call call MD or return to ER for similar or worsening recurring problem that brought you to hospital or if any fever,nausea/vomiting,abdominal pain, uncontrolled pain, chest pain,  shortness of breath or any other alarming symptoms.  Please follow-up your doctor as instructed in a week time and call the office for appointment.  Please avoid alcohol, smoking, or any other illicit substance and maintain healthy habits including taking your regular medications as prescribed.  You were cared for by a hospitalist during your hospital stay. If you have any questions about your discharge medications or the care you received while you were in the hospital after you are discharged, you can call the unit and ask to speak with the hospitalist on call if the hospitalist that took care of you is not available.  Once you are discharged, your primary care physician will handle any further medical issues. Please note that NO REFILLS for any discharge medications will be authorized once you are discharged, as it is imperative that you return to your primary care physician (or establish a relationship with a primary care physician if you do not have one) for your aftercare needs so that they can reassess your need for medications and monitor your lab values   Please see your Pulmonary in 1 week.   Increase activity slowly   Complete by: As directed      Allergies as of 03/18/2020  Reactions   Other Other (See Comments)   IV meds for migraine, patient stated it made her feel like something was crawling all over her body.      Medication List    TAKE these medications   ALPRAZolam 0.5 MG tablet Commonly known as: XANAX Take 0.5 mg by mouth 3 (three) times daily as needed for anxiety.   cefdinir 300 MG capsule Commonly known as: OMNICEF Take 1 capsule (300 mg total) by mouth every 12 (twelve) hours for 7 days.   Combivent Respimat 20-100 MCG/ACT Aers  respimat Generic drug: Ipratropium-Albuterol Inhale 1 puff into the lungs every 6 (six) hours as needed for wheezing or shortness of breath.   cyclobenzaprine 5 MG tablet Commonly known as: FLEXERIL Take 5 mg by mouth 3 (three) times daily as needed for muscle spasms.   divalproex 500 MG 24 hr tablet Commonly known as: DEPAKOTE ER Take 500 mg by mouth daily.   doxycycline 100 MG tablet Commonly known as: VIBRA-TABS Take 1 tablet (100 mg total) by mouth every 12 (twelve) hours for 7 days.   DULoxetine 60 MG capsule Commonly known as: CYMBALTA Take 1 capsule (60 mg total) by mouth daily. What changed: when to take this   gabapentin 300 MG capsule Commonly known as: NEURONTIN Take 600 mg by mouth 4 (four) times daily.   hydrochlorothiazide 50 MG tablet Commonly known as: HYDRODIURIL Take 50 mg by mouth daily.   lisinopril 20 MG tablet Commonly known as: ZESTRIL Take 20 mg by mouth daily.   naloxone 4 MG/0.1ML Liqd nasal spray kit Commonly known as: NARCAN Place 1 spray into the nose daily as needed (overdose).   naproxen 500 MG tablet Commonly known as: NAPROSYN Take 500 mg by mouth 2 (two) times daily with a meal.   oxyCODONE 15 MG immediate release tablet Commonly known as: ROXICODONE Take 15 mg by mouth 4 (four) times daily as needed for pain.   pantoprazole 40 MG tablet Commonly known as: PROTONIX Take 1 tablet (40 mg total) by mouth 2 (two) times daily before a meal.   predniSONE 10 MG tablet Commonly known as: DELTASONE Take PO 4 tabs daily x 3 days,3 tabs daily x 3 days,2 tabs daily x 3 days,1 tab daily x 2 days then stop.   promethazine 25 MG tablet Commonly known as: PHENERGAN Take 25 mg by mouth every 6 (six) hours as needed for nausea or vomiting.   Symbicort 160-4.5 MCG/ACT inhaler Generic drug: budesonide-formoterol Inhale 2 puffs into the lungs daily.   topiramate 25 MG tablet Commonly known as: TOPAMAX Take 25 mg by mouth at bedtime.    Ventolin HFA 108 (90 Base) MCG/ACT inhaler Generic drug: albuterol Inhale 1 puff into the lungs every 4 (four) hours as needed for shortness of breath or wheezing. What changed: Another medication with the same name was added. Make sure you understand how and when to take each.   albuterol (2.5 MG/3ML) 0.083% nebulizer solution Commonly known as: PROVENTIL Take 3 mLs (2.5 mg total) by nebulization every 6 (six) hours as needed for wheezing or shortness of breath. What changed: You were already taking a medication with the same name, and this prescription was added. Make sure you understand how and when to take each.   zolpidem 10 MG tablet Commonly known as: AMBIEN Take 10 mg by mouth at bedtime as needed for sleep.            Durable Medical Equipment  (From admission, onward)  Start     Ordered   03/18/20 1055  For home use only DME oxygen  Once       Question Answer Comment  Length of Need Lifetime   Liters per Minute 2   Frequency Continuous (stationary and portable oxygen unit needed)   Oxygen delivery system Gas      03/18/20 1055          Allergies  Allergen Reactions  . Other Other (See Comments)    IV meds for migraine, patient stated it made her feel like something was crawling all over her body.    The results of significant diagnostics from this hospitalization (including imaging, microbiology, ancillary and laboratory) are listed below for reference.    Microbiology: Recent Results (from the past 240 hour(s))  SARS Coronavirus 2 by RT PCR (hospital order, performed in [facility] hospital lab) Nasopharyngeal Nasopharyngeal Swab     Status: None   Collection Time: 03/15/20 11:50 PM   Specimen: Nasopharyngeal Swab  Result Value Ref Range Status   SARS Coronavirus 2 NEGATIVE NEGATIVE Final    Comment: (NOTE) SARS-CoV-2 target nucleic acids are NOT DETECTED.  The SARS-CoV-2 RNA is generally detectable in upper and lower respiratory specimens  during the acute phase of infection. The lowest concentration of SARS-CoV-2 viral copies this assay can detect is 250 copies / mL. A negative result does not preclude SARS-CoV-2 infection and should not be used as the sole basis for treatment or other patient management decisions.  A negative result may occur with improper specimen collection / handling, submission of specimen other than nasopharyngeal swab, presence of viral mutation(s) within the areas targeted by this assay, and inadequate number of viral copies (<250 copies / mL). A negative result must be combined with clinical observations, patient history, and epidemiological information.  Fact Sheet for Patients:   StrictlyIdeas.no  Fact Sheet for Healthcare Providers: BankingDealers.co.za  This test is not yet approved or  cleared by the [place] FDA and has been authorized for detection and/or diagnosis of SARS-CoV-2 by FDA under an Emergency Use Authorization (EUA).  This EUA will remain in effect (meaning this test can be used) for the duration of the COVID-19 declaration under Section 564(b)(1) of the Act, 21 U.S.C. section 360bbb-3(b)(1), unless the authorization is terminated or revoked sooner.  Performed at [facility], [place] [address], [place], [place] [address]   Blood culture (routine x 2)     Status: None (Preliminary result)   Collection Time: 03/16/20  2:47 AM   Specimen: BLOOD  Result Value Ref Range Status   Specimen Description   Final    BLOOD RIGHT ANTECUBITAL Performed at [facility] [address], [place], [place] [address]    Special Requests   Final    BOTTLES DRAWN AEROBIC AND ANAEROBIC Blood Culture adequate volume Performed at [facility] [address], [place], [place] [address]    Culture   Final    NO GROWTH < 24 HOURS Performed at [facility] [address]  [place], [place] [address]    Report Status PENDING  Incomplete  Blood culture (routine x 2)     Status: None (Preliminary result)   Collection Time: 03/16/20  3:42 AM   Specimen: BLOOD  Result Value Ref Range Status   Specimen Description   Final    BLOOD BLOOD LEFT HAND Performed at [facility] [address] [place], [place] [address]  Special Requests   Final    BOTTLES DRAWN AEROBIC AND ANAEROBIC Blood Culture results may not be optimal due to an inadequate volume of blood received in culture bottles Performed at [facility] [address], [place], [place] [address]    Culture   Final    NO GROWTH < 24 HOURS Performed at [facility] [address] [place], [place] [address]    Report Status PENDING  Incomplete  MRSA PCR Screening     Status: None   Collection Time: 03/16/20  7:54 PM   Specimen: Nasal Mucosa; Nasopharyngeal  Result Value Ref Range Status   MRSA by PCR NEGATIVE NEGATIVE Final    Comment:        The GeneXpert MRSA Assay (FDA approved for NASAL specimens only), is one component of a comprehensive MRSA colonization surveillance program. It is not intended to diagnose MRSA infection nor to guide or monitor treatment for MRSA infections. Performed at [facility], [place] [address], [place], [place] [address]   Respiratory Panel by PCR     Status: None   Collection Time: 03/17/20 12:35 PM  Result Value Ref Range Status   Adenovirus NOT DETECTED NOT DETECTED Final   Coronavirus 229E NOT DETECTED NOT DETECTED Final    Comment: (NOTE) The Coronavirus on the Respiratory Panel, DOES NOT test for the novel  Coronavirus (2019 nCoV)    Coronavirus HKU1 NOT DETECTED NOT DETECTED Final   Coronavirus NL63 NOT DETECTED NOT DETECTED Final   Coronavirus OC43 NOT DETECTED NOT DETECTED Final   Metapneumovirus NOT DETECTED NOT DETECTED Final   Rhinovirus / Enterovirus NOT DETECTED NOT DETECTED Final    Influenza A NOT DETECTED NOT DETECTED Final   Influenza B NOT DETECTED NOT DETECTED Final   Parainfluenza Virus 1 NOT DETECTED NOT DETECTED Final   Parainfluenza Virus 2 NOT DETECTED NOT DETECTED Final   Parainfluenza Virus 3 NOT DETECTED NOT DETECTED Final   Parainfluenza Virus 4 NOT DETECTED NOT DETECTED Final   Respiratory Syncytial Virus NOT DETECTED NOT DETECTED Final   Bordetella pertussis NOT DETECTED NOT DETECTED Final   Chlamydophila pneumoniae NOT DETECTED NOT DETECTED Final   Mycoplasma pneumoniae NOT DETECTED NOT DETECTED Final    Comment: Performed at [facility], [place]. [address] [place], [place] [address]    Procedures/Studies: CT Angio Chest PE W and/or Wo Contrast  Result Date: 03/16/2020 CLINICAL DATA:  Hypoxemia, asthma exacerbation, elevated D dimer EXAM: CT ANGIOGRAPHY CHEST WITH CONTRAST TECHNIQUE: Multidetector CT imaging of the chest was performed using the standard protocol during bolus administration of intravenous contrast. Multiplanar CT image reconstructions and MIPs were obtained to evaluate the vascular anatomy. CONTRAST:  163m OMNIPAQUE IOHEXOL 350 MG/ML SOLN COMPARISON:  05/03/2018, 03/05/2018, 03/16/2020 FINDINGS: Cardiovascular: This is a technically adequate evaluation of the pulmonary vasculature. There are no central or segmental pulmonary emboli. Evaluation of the subsegmental branches, especially at the bases, is limited due to respiratory motion. The heart is unremarkable without pericardial effusion. Normal caliber of the thoracic aorta. Mediastinum/Nodes: Mediastinal lymphadenopathy again noted, largest lymph node in the precarinal region measuring 17 mm in short axis, not appreciably changed. The thyroid, trachea, and esophagus are unremarkable. Lungs/Pleura: There is widespread bilateral airspace disease, corresponding chest x-ray findings. There is slight sparing of the lung bases. No effusion or pneumothorax. The central airways are patent. Upper  Abdomen: No acute abnormality. Musculoskeletal: There are no acute or destructive bony lesions. Reconstructed images demonstrate no  additional findings. Review of the MIP images confirms the above findings. IMPRESSION: 1. No evidence of central or segmental pulmonary emboli. 2. Widespread bilateral airspace disease. Differential diagnosis would include multifocal infection, pneumonitis, vasculitis, or less likely edema. Alveolar sarcoid could give this pattern, especially given the concomitant mediastinal lymphadenopathy. Overall, the pattern is similar to numerous prior CTs dating to 2019. Electronically Signed   By: Randa Ngo M.D.   On: 03/16/2020 02:49   DG Chest Portable 1 View  Result Date: 03/16/2020 CLINICAL DATA:  Short of breath EXAM: PORTABLE CHEST 1 VIEW COMPARISON:  01/08/2019 FINDINGS: Single frontal view of the chest demonstrates an enlarged cardiac silhouette. There is widespread bilateral airspace disease. No large effusion or pneumothorax. No acute bony abnormality. IMPRESSION: 1. Diffuse bilateral airspace disease compatible with pulmonary edema or multifocal infection. Electronically Signed   By: Randa Ngo M.D.   On: 03/16/2020 00:18    Labs: BNP (last 3 results) Recent Labs    03/15/20 2350  BNP 66.2   Basic Metabolic Panel: Recent Labs  Lab 03/15/20 2350 03/16/20 0500 03/17/20 0234 03/18/20 0414  NA 138 137 138 142  K 3.3* 3.9 4.0 3.2*  CL 96* 98 104 105  CO2 '27 27 25 28  ' GLUCOSE 185* 183* 153* 132*  BUN '10 9 19 ' 22*  CREATININE 0.74 0.63 0.70 0.67  CALCIUM 9.1 8.9 8.8* 9.1  MG  --  2.1  --   --    Liver Function Tests: No results for input(s): AST, ALT, ALKPHOS, BILITOT, PROT, ALBUMIN in the last 168 hours. No results for input(s): LIPASE, AMYLASE in the last 168 hours. No results for input(s): AMMONIA in the last 168 hours. CBC: Recent Labs  Lab 03/15/20 2350 03/16/20 0500 03/17/20 0234 03/18/20 0414  WBC 12.3* 13.0* 20.6* 15.2*  NEUTROABS  10.2* 11.9* 18.5* 10.5*  HGB 11.9* 12.2 12.0 12.1  HCT 36.2 37.5 37.0 38.2  MCV 83.0 83.5 85.1 85.5  PLT 325 320 389 382   Cardiac Enzymes: No results for input(s): CKTOTAL, CKMB, CKMBINDEX, TROPONINI in the last 168 hours. BNP: Invalid input(s): POCBNP CBG: No results for input(s): GLUCAP in the last 168 hours. D-Dimer Recent Labs    03/15/20 2350  DDIMER 1.10*   Hgb A1c No results for input(s): HGBA1C in the last 72 hours. Lipid Profile No results for input(s): CHOL, HDL, LDLCALC, TRIG, CHOLHDL, LDLDIRECT in the last 72 hours. Thyroid function studies No results for input(s): TSH, T4TOTAL, T3FREE, THYROIDAB in the last 72 hours.  Invalid input(s): FREET3 Anemia work up No results for input(s): VITAMINB12, FOLATE, FERRITIN, TIBC, IRON, RETICCTPCT in the last 72 hours. Urinalysis    Component Value Date/Time   COLORURINE YELLOW 06/05/2016 0051   APPEARANCEUR CLOUDY (A) 06/05/2016 0051   LABSPEC 1.016 06/05/2016 0051   PHURINE 6.0 06/05/2016 0051   GLUCOSEU NEGATIVE 06/05/2016 0051   HGBUR NEGATIVE 06/05/2016 0051   BILIRUBINUR NEGATIVE 06/05/2016 0051   KETONESUR NEGATIVE 06/05/2016 0051   PROTEINUR NEGATIVE 06/05/2016 0051   UROBILINOGEN 1.0 07/31/2015 2028   NITRITE NEGATIVE 06/05/2016 0051   LEUKOCYTESUR TRACE (A) 06/05/2016 0051   Sepsis Labs Invalid input(s): PROCALCITONIN,  WBC,  LACTICIDVEN Microbiology Recent Results (from the past 240 hour(s))  SARS Coronavirus 2 by RT PCR (hospital order, performed in [facility] hospital lab) Nasopharyngeal Nasopharyngeal Swab     Status: None   Collection Time: 03/15/20 11:50 PM   Specimen: Nasopharyngeal Swab  Result Value Ref Range Status   SARS Coronavirus 2  NEGATIVE NEGATIVE Final    Comment: (NOTE) SARS-CoV-2 target nucleic acids are NOT DETECTED.  The SARS-CoV-2 RNA is generally detectable in upper and lower respiratory specimens during the acute phase of infection. The lowest concentration of SARS-CoV-2  viral copies this assay can detect is 250 copies / mL. A negative result does not preclude SARS-CoV-2 infection and should not be used as the sole basis for treatment or other patient management decisions.  A negative result may occur with improper specimen collection / handling, submission of specimen other than nasopharyngeal swab, presence of viral mutation(s) within the areas targeted by this assay, and inadequate number of viral copies (<250 copies / mL). A negative result must be combined with clinical observations, patient history, and epidemiological information.  Fact Sheet for Patients:   StrictlyIdeas.no  Fact Sheet for Healthcare Providers: BankingDealers.co.za  This test is not yet approved or  cleared by the [place] FDA and has been authorized for detection and/or diagnosis of SARS-CoV-2 by FDA under an Emergency Use Authorization (EUA).  This EUA will remain in effect (meaning this test can be used) for the duration of the COVID-19 declaration under Section 564(b)(1) of the Act, 21 U.S.C. section 360bbb-3(b)(1), unless the authorization is terminated or revoked sooner.  Performed at [facility], [place] [address], [place], [place] [address]   Blood culture (routine x 2)     Status: None (Preliminary result)   Collection Time: 03/16/20  2:47 AM   Specimen: BLOOD  Result Value Ref Range Status   Specimen Description   Final    BLOOD RIGHT ANTECUBITAL Performed at [facility] [address] [place], [place] [address]    Special Requests   Final    BOTTLES DRAWN AEROBIC AND ANAEROBIC Blood Culture adequate volume Performed at [facility] [address] [place], [place] [address]    Culture   Final    NO GROWTH < 24 HOURS Performed at [facility] [address], [place], [place] [address]    Report Status PENDING  Incomplete  Blood culture  (routine x 2)     Status: None (Preliminary result)   Collection Time: 03/16/20  3:42 AM   Specimen: BLOOD  Result Value Ref Range Status   Specimen Description   Final    BLOOD BLOOD LEFT HAND Performed at [facility] [address] [place], [place] [address]    Special Requests   Final    BOTTLES DRAWN AEROBIC AND ANAEROBIC Blood Culture results may not be optimal due to an inadequate volume of blood received in culture bottles Performed at [facility] [address] [place], [place] [address]    Culture   Final    NO GROWTH < 24 HOURS Performed at [facility] [address], [place], [place] [address]    Report Status PENDING  Incomplete  MRSA PCR Screening     Status: None   Collection Time: 03/16/20  7:54 PM   Specimen: Nasal Mucosa; Nasopharyngeal  Result Value Ref Range Status   MRSA by PCR NEGATIVE NEGATIVE Final    Comment:        The GeneXpert MRSA Assay (FDA approved for NASAL specimens only), is one component of a comprehensive MRSA colonization surveillance program. It is not intended to diagnose MRSA infection nor to guide or monitor treatment for MRSA infections. Performed at [facility], [place] [address], [place], [place] [address]  Respiratory Panel by PCR     Status: None   Collection Time: 03/17/20 12:35 PM  Result Value Ref Range Status   Adenovirus NOT DETECTED NOT DETECTED Final   Coronavirus 229E NOT DETECTED NOT DETECTED Final    Comment: (NOTE) The Coronavirus on the Respiratory Panel, DOES NOT test for the novel  Coronavirus (2019 nCoV)    Coronavirus HKU1 NOT DETECTED NOT DETECTED Final   Coronavirus NL63 NOT DETECTED NOT DETECTED Final   Coronavirus OC43 NOT DETECTED NOT DETECTED Final   Metapneumovirus NOT DETECTED NOT DETECTED Final   Rhinovirus / Enterovirus NOT DETECTED NOT DETECTED Final   Influenza A NOT DETECTED NOT DETECTED Final   Influenza B NOT DETECTED NOT  DETECTED Final   Parainfluenza Virus 1 NOT DETECTED NOT DETECTED Final   Parainfluenza Virus 2 NOT DETECTED NOT DETECTED Final   Parainfluenza Virus 3 NOT DETECTED NOT DETECTED Final   Parainfluenza Virus 4 NOT DETECTED NOT DETECTED Final   Respiratory Syncytial Virus NOT DETECTED NOT DETECTED Final   Bordetella pertussis NOT DETECTED NOT DETECTED Final   Chlamydophila pneumoniae NOT DETECTED NOT DETECTED Final   Mycoplasma pneumoniae NOT DETECTED NOT DETECTED Final    Comment: Performed at [facility], [address] [place], [place] [address]     Time coordinating discharge: 35 minutes  SIGNED: Antonieta Pert, MD  [facility] 03/18/2020, 11:53 AM  If 7PM-7AM, please contact night-coverage [email or web address]

## 2020-03-18 NOTE — Progress Notes (Signed)
Patient refusing to wait for me to contact case management regarding oxygen at home. Patient states I have big tank at home. I can contact [facility] for more. D/C instructions given to patient. Patient had no questions.

## 2020-03-21 ENCOUNTER — Telehealth: Payer: Self-pay | Admitting: Pulmonary Disease

## 2020-03-21 LAB — CULTURE, BLOOD (ROUTINE X 2)
Culture: NO GROWTH
Culture: NO GROWTH
Special Requests: ADEQUATE

## 2020-03-21 NOTE — Telephone Encounter (Signed)
Staff message sent by Dr. Lake Bells: Joan Doom, MD  P [facility] Triage Pool Cc: Laurin Coder, MD Hi,   Please arrange outpatient hospital follow up for this lady ASAP with Dr. Ander Slade or an NP. Reason: ILD, needs bronchoscopy.   Thanks,  Roselie Awkward    Attempted to call pt to see if I could get her scheduled for a hospital follow up appt with either an APP or AO but unable to reach pt. Left message for pt to return call.  Routing this to front desk pool so they can help [place] out with getting pt scheduled for a hospital follow up.

## 2020-03-23 NOTE — Telephone Encounter (Signed)
Contacted patient's son Jacqulyn Bath and he is going to have his mother call to schedule a hospital follow up with Dr. Ander Slade. Nothing further needed.

## 2020-03-23 NOTE — Telephone Encounter (Signed)
Attempted to call pt to schedule hospital f/u - vm full - per office protocol - close message after two attempts - sending back to triage -pr

## 2020-03-27 ENCOUNTER — Telehealth: Payer: Self-pay | Admitting: Pulmonary Disease

## 2020-03-27 NOTE — Telephone Encounter (Signed)
ATC Patient.  LM to call back. Patient has not been seen in office by [facility] providers at this time. Patient has OV with Dr Ander Slade 04/10/20.

## 2020-03-28 NOTE — Telephone Encounter (Signed)
LMTCB x2 for pt. All issues will be addressed at the pt's OV as we have never seen her in office.

## 2020-03-29 NOTE — Telephone Encounter (Signed)
LMTCB x3 for pt. We have attempted to contact pt several times with no success or call back from pt. Per triage protocol, message will be closed.   

## 2020-04-10 ENCOUNTER — Inpatient Hospital Stay: Payer: Medicare Other | Admitting: Primary Care

## 2020-04-20 ENCOUNTER — Inpatient Hospital Stay: Payer: Medicare Other | Admitting: Adult Health

## 2020-05-10 ENCOUNTER — Other Ambulatory Visit: Payer: Self-pay | Admitting: Nurse Practitioner

## 2020-05-10 DIAGNOSIS — M549 Dorsalgia, unspecified: Secondary | ICD-10-CM

## 2020-07-22 ENCOUNTER — Inpatient Hospital Stay: Admission: RE | Admit: 2020-07-22 | Payer: Medicare Other | Source: Ambulatory Visit

## 2020-09-10 ENCOUNTER — Emergency Department (HOSPITAL_COMMUNITY)
Admission: EM | Admit: 2020-09-10 | Discharge: 2020-09-10 | Disposition: A | Discharge status: Home / Self Care | Payer: Medicare Other | Attending: Emergency Medicine | Admitting: Emergency Medicine

## 2020-09-10 ENCOUNTER — Encounter (HOSPITAL_COMMUNITY): Payer: Self-pay | Admitting: Emergency Medicine

## 2020-09-10 ENCOUNTER — Other Ambulatory Visit: Payer: Self-pay

## 2020-09-10 DIAGNOSIS — R52 Pain, unspecified: Secondary | ICD-10-CM | POA: Diagnosis not present

## 2020-09-10 NOTE — ED Notes (Signed)
Patient called for room placement x3 with no answer. 

## 2020-09-10 NOTE — ED Triage Notes (Signed)
Per EMS, patient from home, c/o pain all over. Hx chronic pain. Reports taking prescribed pain medications and xanax at home without relief. Sleeping in wheelchair at this time.

## 2020-09-10 NOTE — ED Notes (Signed)
Patient called for room placement x2 with no answer. 

## 2020-09-10 NOTE — ED Notes (Signed)
Patient called for room placement x1 with no answer. 

## 2021-02-17 ENCOUNTER — Emergency Department (HOSPITAL_COMMUNITY): Payer: Medicare Other

## 2021-02-17 ENCOUNTER — Inpatient Hospital Stay (HOSPITAL_COMMUNITY)
Admission: EM | Admit: 2021-02-17 | Discharge: 2021-02-20 | DRG: 193 | Disposition: A | Discharge status: Home / Self Care | Payer: Medicare Other | Attending: Internal Medicine | Admitting: Internal Medicine

## 2021-02-17 ENCOUNTER — Encounter (HOSPITAL_COMMUNITY): Payer: Self-pay

## 2021-02-17 ENCOUNTER — Other Ambulatory Visit: Payer: Self-pay

## 2021-02-17 DIAGNOSIS — Z7951 Long term (current) use of inhaled steroids: Secondary | ICD-10-CM

## 2021-02-17 DIAGNOSIS — Z833 Family history of diabetes mellitus: Secondary | ICD-10-CM

## 2021-02-17 DIAGNOSIS — F17210 Nicotine dependence, cigarettes, uncomplicated: Secondary | ICD-10-CM | POA: Diagnosis present

## 2021-02-17 DIAGNOSIS — J9601 Acute respiratory failure with hypoxia: Secondary | ICD-10-CM | POA: Diagnosis present

## 2021-02-17 DIAGNOSIS — Z808 Family history of malignant neoplasm of other organs or systems: Secondary | ICD-10-CM

## 2021-02-17 DIAGNOSIS — G43909 Migraine, unspecified, not intractable, without status migrainosus: Secondary | ICD-10-CM | POA: Diagnosis present

## 2021-02-17 DIAGNOSIS — E876 Hypokalemia: Secondary | ICD-10-CM | POA: Diagnosis present

## 2021-02-17 DIAGNOSIS — I10 Essential (primary) hypertension: Secondary | ICD-10-CM | POA: Diagnosis present

## 2021-02-17 DIAGNOSIS — J189 Pneumonia, unspecified organism: Secondary | ICD-10-CM | POA: Diagnosis not present

## 2021-02-17 DIAGNOSIS — G894 Chronic pain syndrome: Secondary | ICD-10-CM | POA: Diagnosis present

## 2021-02-17 DIAGNOSIS — Z720 Tobacco use: Secondary | ICD-10-CM | POA: Diagnosis present

## 2021-02-17 DIAGNOSIS — F419 Anxiety disorder, unspecified: Secondary | ICD-10-CM | POA: Diagnosis present

## 2021-02-17 DIAGNOSIS — M542 Cervicalgia: Secondary | ICD-10-CM | POA: Diagnosis present

## 2021-02-17 DIAGNOSIS — M549 Dorsalgia, unspecified: Secondary | ICD-10-CM | POA: Diagnosis present

## 2021-02-17 DIAGNOSIS — Z79899 Other long term (current) drug therapy: Secondary | ICD-10-CM

## 2021-02-17 DIAGNOSIS — K219 Gastro-esophageal reflux disease without esophagitis: Secondary | ICD-10-CM | POA: Diagnosis present

## 2021-02-17 DIAGNOSIS — Z20822 Contact with and (suspected) exposure to covid-19: Secondary | ICD-10-CM | POA: Diagnosis present

## 2021-02-17 DIAGNOSIS — R0602 Shortness of breath: Secondary | ICD-10-CM | POA: Diagnosis not present

## 2021-02-17 DIAGNOSIS — Z8249 Family history of ischemic heart disease and other diseases of the circulatory system: Secondary | ICD-10-CM

## 2021-02-17 DIAGNOSIS — J45901 Unspecified asthma with (acute) exacerbation: Secondary | ICD-10-CM | POA: Diagnosis present

## 2021-02-17 LAB — CBC WITH DIFFERENTIAL/PLATELET
Abs Immature Granulocytes: 0.08 10*3/uL — ABNORMAL HIGH (ref 0.00–0.07)
Basophils Absolute: 0.1 10*3/uL (ref 0.0–0.1)
Basophils Relative: 1 %
Eosinophils Absolute: 0.1 10*3/uL (ref 0.0–0.5)
Eosinophils Relative: 1 %
HCT: 35.5 % — ABNORMAL LOW (ref 36.0–46.0)
Hemoglobin: 11.6 g/dL — ABNORMAL LOW (ref 12.0–15.0)
Immature Granulocytes: 1 %
Lymphocytes Relative: 21 %
Lymphs Abs: 2.7 10*3/uL (ref 0.7–4.0)
MCH: 28.1 pg (ref 26.0–34.0)
MCHC: 32.7 g/dL (ref 30.0–36.0)
MCV: 86 fL (ref 80.0–100.0)
Monocytes Absolute: 0.5 10*3/uL (ref 0.1–1.0)
Monocytes Relative: 4 %
Neutro Abs: 9.9 10*3/uL — ABNORMAL HIGH (ref 1.7–7.7)
Neutrophils Relative %: 72 %
Platelets: 230 10*3/uL (ref 150–400)
RBC: 4.13 MIL/uL (ref 3.87–5.11)
RDW: 14.7 % (ref 11.5–15.5)
WBC: 13.4 10*3/uL — ABNORMAL HIGH (ref 4.0–10.5)
nRBC: 0 % (ref 0.0–0.2)

## 2021-02-17 MED ORDER — ONDANSETRON HCL 4 MG/2ML IJ SOLN
4.0000 mg | Freq: Once | INTRAMUSCULAR | Status: AC
  Administered 2021-02-17: 4 mg via INTRAVENOUS
  Filled 2021-02-17: qty 2

## 2021-02-17 NOTE — Progress Notes (Signed)
RT note. Patient placed on bipap 10/5 100% sat 100%, VSS. RT will continue to monitor.

## 2021-02-17 NOTE — ED Triage Notes (Addendum)
Patient arrives from home with [facility], called for SOB, hx asthma, wheezing and rhonci, giving neb, unable to oxygenate higher than 82%, then placed on CPAP.    20 R A/C 20 R FA 125mg  solumedrol 2g mag IV 0.3 epi IM 1 Duoneb

## 2021-02-18 ENCOUNTER — Encounter (HOSPITAL_COMMUNITY): Payer: Self-pay | Admitting: Family Medicine

## 2021-02-18 DIAGNOSIS — Z7951 Long term (current) use of inhaled steroids: Secondary | ICD-10-CM | POA: Diagnosis not present

## 2021-02-18 DIAGNOSIS — Z8249 Family history of ischemic heart disease and other diseases of the circulatory system: Secondary | ICD-10-CM | POA: Diagnosis not present

## 2021-02-18 DIAGNOSIS — J189 Pneumonia, unspecified organism: Secondary | ICD-10-CM | POA: Diagnosis present

## 2021-02-18 DIAGNOSIS — R0602 Shortness of breath: Secondary | ICD-10-CM | POA: Diagnosis present

## 2021-02-18 DIAGNOSIS — Z833 Family history of diabetes mellitus: Secondary | ICD-10-CM | POA: Diagnosis not present

## 2021-02-18 DIAGNOSIS — J9601 Acute respiratory failure with hypoxia: Secondary | ICD-10-CM | POA: Diagnosis present

## 2021-02-18 DIAGNOSIS — J45901 Unspecified asthma with (acute) exacerbation: Secondary | ICD-10-CM

## 2021-02-18 DIAGNOSIS — E876 Hypokalemia: Secondary | ICD-10-CM | POA: Diagnosis present

## 2021-02-18 DIAGNOSIS — M542 Cervicalgia: Secondary | ICD-10-CM | POA: Diagnosis present

## 2021-02-18 DIAGNOSIS — R0609 Other forms of dyspnea: Secondary | ICD-10-CM | POA: Diagnosis not present

## 2021-02-18 DIAGNOSIS — F17210 Nicotine dependence, cigarettes, uncomplicated: Secondary | ICD-10-CM | POA: Diagnosis present

## 2021-02-18 DIAGNOSIS — K219 Gastro-esophageal reflux disease without esophagitis: Secondary | ICD-10-CM | POA: Diagnosis present

## 2021-02-18 DIAGNOSIS — Z808 Family history of malignant neoplasm of other organs or systems: Secondary | ICD-10-CM | POA: Diagnosis not present

## 2021-02-18 DIAGNOSIS — G43909 Migraine, unspecified, not intractable, without status migrainosus: Secondary | ICD-10-CM | POA: Diagnosis present

## 2021-02-18 DIAGNOSIS — G894 Chronic pain syndrome: Secondary | ICD-10-CM | POA: Diagnosis present

## 2021-02-18 DIAGNOSIS — Z20822 Contact with and (suspected) exposure to covid-19: Secondary | ICD-10-CM | POA: Diagnosis present

## 2021-02-18 DIAGNOSIS — Z79899 Other long term (current) drug therapy: Secondary | ICD-10-CM | POA: Diagnosis not present

## 2021-02-18 DIAGNOSIS — I10 Essential (primary) hypertension: Secondary | ICD-10-CM

## 2021-02-18 DIAGNOSIS — F419 Anxiety disorder, unspecified: Secondary | ICD-10-CM | POA: Diagnosis present

## 2021-02-18 DIAGNOSIS — Z720 Tobacco use: Secondary | ICD-10-CM

## 2021-02-18 DIAGNOSIS — M549 Dorsalgia, unspecified: Secondary | ICD-10-CM | POA: Diagnosis present

## 2021-02-18 LAB — I-STAT ARTERIAL BLOOD GAS, ED
Acid-Base Excess: 3 mmol/L — ABNORMAL HIGH (ref 0.0–2.0)
Bicarbonate: 28.3 mmol/L — ABNORMAL HIGH (ref 20.0–28.0)
Calcium, Ion: 1.19 mmol/L (ref 1.15–1.40)
HCT: 31 % — ABNORMAL LOW (ref 36.0–46.0)
Hemoglobin: 10.5 g/dL — ABNORMAL LOW (ref 12.0–15.0)
O2 Saturation: 97 %
Patient temperature: 98.7
Potassium: 2.6 mmol/L — CL (ref 3.5–5.1)
Sodium: 136 mmol/L (ref 135–145)
TCO2: 30 mmol/L (ref 22–32)
pCO2 arterial: 47.9 mmHg (ref 32.0–48.0)
pH, Arterial: 7.38 (ref 7.350–7.450)
pO2, Arterial: 98 mmHg (ref 83.0–108.0)

## 2021-02-18 LAB — MAGNESIUM
Magnesium: 2.8 mg/dL — ABNORMAL HIGH (ref 1.7–2.4)
Magnesium: 2.5 mg/dL — ABNORMAL HIGH (ref 1.7–2.4)

## 2021-02-18 LAB — CBC
HCT: 26.1 % — ABNORMAL LOW (ref 36.0–46.0)
Hemoglobin: 8.4 g/dL — ABNORMAL LOW (ref 12.0–15.0)
MCH: 28.6 pg (ref 26.0–34.0)
MCHC: 32.2 g/dL (ref 30.0–36.0)
MCV: 88.8 fL (ref 80.0–100.0)
Platelets: 167 10*3/uL (ref 150–400)
RBC: 2.94 MIL/uL — ABNORMAL LOW (ref 3.87–5.11)
RDW: 14.6 % (ref 11.5–15.5)
WBC: 11.2 10*3/uL — ABNORMAL HIGH (ref 4.0–10.5)
nRBC: 0 % (ref 0.0–0.2)

## 2021-02-18 LAB — BASIC METABOLIC PANEL
Anion gap: 11 (ref 5–15)
Anion gap: 11 (ref 5–15)
BUN: 10 mg/dL (ref 6–20)
BUN: 14 mg/dL (ref 6–20)
CO2: 23 mmol/L (ref 22–32)
CO2: 22 mmol/L (ref 22–32)
Calcium: 8.6 mg/dL — ABNORMAL LOW (ref 8.9–10.3)
Calcium: 7.9 mg/dL — ABNORMAL LOW (ref 8.9–10.3)
Chloride: 100 mmol/L (ref 98–111)
Chloride: 103 mmol/L (ref 98–111)
Creatinine, Ser: 0.93 mg/dL (ref 0.44–1.00)
Creatinine, Ser: 0.71 mg/dL (ref 0.44–1.00)
GFR, Estimated: >60 mL/min (ref >60)
GFR, Estimated: >60 mL/min (ref >60)
Glucose, Bld: 157 mg/dL — ABNORMAL HIGH (ref 70–99)
Glucose, Bld: 172 mg/dL — ABNORMAL HIGH (ref 70–99)
Potassium: 2.5 mmol/L — CL (ref 3.5–5.1)
Potassium: 3.2 mmol/L — ABNORMAL LOW (ref 3.5–5.1)
Sodium: 134 mmol/L — ABNORMAL LOW (ref 135–145)
Sodium: 136 mmol/L (ref 135–145)

## 2021-02-18 LAB — RESP PANEL BY RT-PCR (FLU A&B, COVID) ARPGX2
Influenza A by PCR: NEGATIVE
Influenza B by PCR: NEGATIVE
SARS Coronavirus 2 by RT PCR: NEGATIVE

## 2021-02-18 LAB — PROCALCITONIN: Procalcitonin: 1.01 ng/mL

## 2021-02-18 LAB — BRAIN NATRIURETIC PEPTIDE: B Natriuretic Peptide: 145.5 pg/mL — ABNORMAL HIGH (ref 0.0–100.0)

## 2021-02-18 MED ORDER — NALOXONE HCL 4 MG/0.1ML NA LIQD
1.0000 | Freq: Every day | NASAL | Status: DC | PRN
  Filled 2021-02-18: qty 8

## 2021-02-18 MED ORDER — SODIUM CHLORIDE 0.9 % IV SOLN
1.0000 g | INTRAVENOUS | Status: DC
  Administered 2021-02-18 – 2021-02-20 (×3): 1 g via INTRAVENOUS
  Filled 2021-02-18 (×3): qty 10

## 2021-02-18 MED ORDER — NICOTINE 14 MG/24HR TD PT24
14.0000 mg | MEDICATED_PATCH | Freq: Every day | TRANSDERMAL | Status: DC
  Administered 2021-02-18 – 2021-02-20 (×3): 14 mg via TRANSDERMAL
  Filled 2021-02-18 (×3): qty 1

## 2021-02-18 MED ORDER — ACETAMINOPHEN 325 MG PO TABS: 650.0000 mg | ORAL_TABLET | Freq: Four times a day (QID) | ORAL | Status: DC | PRN

## 2021-02-18 MED ORDER — BUDESONIDE 0.25 MG/2ML IN SUSP
0.2500 mg | Freq: Two times a day (BID) | RESPIRATORY_TRACT | Status: DC
  Administered 2021-02-18 – 2021-02-20 (×4): 0.25 mg via RESPIRATORY_TRACT
  Filled 2021-02-18 (×6): qty 2

## 2021-02-18 MED ORDER — HYDROCHLOROTHIAZIDE 25 MG PO TABS
50.0000 mg | ORAL_TABLET | Freq: Every day | ORAL | Status: DC
  Administered 2021-02-19 – 2021-02-20 (×2): 50 mg via ORAL
  Filled 2021-02-18 (×2): qty 2

## 2021-02-18 MED ORDER — MOMETASONE FURO-FORMOTEROL FUM 200-5 MCG/ACT IN AERO
2.0000 | INHALATION_SPRAY | Freq: Two times a day (BID) | RESPIRATORY_TRACT | Status: DC
  Filled 2021-02-18: qty 8.8

## 2021-02-18 MED ORDER — POTASSIUM CHLORIDE CRYS ER 20 MEQ PO TBCR
40.0000 meq | EXTENDED_RELEASE_TABLET | Freq: Three times a day (TID) | ORAL | Status: AC
  Administered 2021-02-18 – 2021-02-19 (×3): 40 meq via ORAL
  Filled 2021-02-18 (×3): qty 2

## 2021-02-18 MED ORDER — POTASSIUM CHLORIDE 10 MEQ/100ML IV SOLN
10.0000 meq | Freq: Once | INTRAVENOUS | Status: AC
  Administered 2021-02-18: 10 meq via INTRAVENOUS
  Filled 2021-02-18: qty 100

## 2021-02-18 MED ORDER — OXYCODONE HCL 5 MG PO TABS
15.0000 mg | ORAL_TABLET | Freq: Four times a day (QID) | ORAL | Status: DC | PRN
  Administered 2021-02-18 – 2021-02-20 (×7): 15 mg via ORAL
  Filled 2021-02-18 (×8): qty 3

## 2021-02-18 MED ORDER — DIVALPROEX SODIUM ER 500 MG PO TB24
1000.0000 mg | ORAL_TABLET | Freq: Every day | ORAL | Status: DC
  Administered 2021-02-18 – 2021-02-20 (×3): 1000 mg via ORAL
  Filled 2021-02-18 (×3): qty 2

## 2021-02-18 MED ORDER — ALBUTEROL SULFATE (2.5 MG/3ML) 0.083% IN NEBU
2.5000 mg | INHALATION_SOLUTION | RESPIRATORY_TRACT | Status: DC | PRN
  Administered 2021-02-18: 2.5 mg via RESPIRATORY_TRACT
  Filled 2021-02-18 (×2): qty 3

## 2021-02-18 MED ORDER — METHYLPREDNISOLONE SODIUM SUCC 125 MG IJ SOLR
125.0000 mg | Freq: Three times a day (TID) | INTRAMUSCULAR | Status: DC
  Administered 2021-02-18: 125 mg via INTRAVENOUS
  Filled 2021-02-18: qty 2

## 2021-02-18 MED ORDER — MAGNESIUM SULFATE 2 GM/50ML IV SOLN
2.0000 g | Freq: Once | INTRAVENOUS | Status: AC
  Administered 2021-02-18: 2 g via INTRAVENOUS
  Filled 2021-02-18: qty 50

## 2021-02-18 MED ORDER — POTASSIUM CHLORIDE IN NACL 40-0.9 MEQ/L-% IV SOLN
INTRAVENOUS | Status: DC
  Filled 2021-02-18: qty 1000

## 2021-02-18 MED ORDER — DULOXETINE HCL 60 MG PO CPEP
60.0000 mg | ORAL_CAPSULE | Freq: Two times a day (BID) | ORAL | Status: DC
  Administered 2021-02-18 – 2021-02-20 (×5): 60 mg via ORAL
  Filled 2021-02-18 (×7): qty 1

## 2021-02-18 MED ORDER — ONDANSETRON HCL 4 MG/2ML IJ SOLN: 4.0000 mg | Freq: Four times a day (QID) | INTRAMUSCULAR | Status: DC | PRN

## 2021-02-18 MED ORDER — ENOXAPARIN SODIUM 40 MG/0.4ML IJ SOSY
40.0000 mg | PREFILLED_SYRINGE | Freq: Every day | INTRAMUSCULAR | Status: DC
  Administered 2021-02-18 – 2021-02-20 (×3): 40 mg via SUBCUTANEOUS
  Filled 2021-02-18 (×3): qty 0.4

## 2021-02-18 MED ORDER — CYCLOBENZAPRINE HCL 10 MG PO TABS
5.0000 mg | ORAL_TABLET | Freq: Three times a day (TID) | ORAL | Status: DC | PRN
  Administered 2021-02-18: 5 mg via ORAL
  Filled 2021-02-18 (×2): qty 1

## 2021-02-18 MED ORDER — METHYLPREDNISOLONE SODIUM SUCC 125 MG IJ SOLR
60.0000 mg | Freq: Four times a day (QID) | INTRAMUSCULAR | Status: DC
  Administered 2021-02-18 – 2021-02-20 (×9): 60 mg via INTRAVENOUS
  Filled 2021-02-18 (×9): qty 2

## 2021-02-18 MED ORDER — GABAPENTIN 300 MG PO CAPS
600.0000 mg | ORAL_CAPSULE | Freq: Four times a day (QID) | ORAL | Status: DC
  Administered 2021-02-18 – 2021-02-20 (×8): 600 mg via ORAL
  Filled 2021-02-18 (×8): qty 2

## 2021-02-18 MED ORDER — PROMETHAZINE HCL 25 MG PO TABS: 25.0000 mg | ORAL_TABLET | Freq: Four times a day (QID) | ORAL | Status: DC | PRN

## 2021-02-18 MED ORDER — LISINOPRIL 20 MG PO TABS
20.0000 mg | ORAL_TABLET | Freq: Every day | ORAL | Status: DC
  Administered 2021-02-19 – 2021-02-20 (×2): 20 mg via ORAL
  Filled 2021-02-18 (×2): qty 1

## 2021-02-18 MED ORDER — ALPRAZOLAM 0.5 MG PO TABS
0.5000 mg | ORAL_TABLET | Freq: Three times a day (TID) | ORAL | Status: DC | PRN
  Administered 2021-02-18 – 2021-02-20 (×4): 0.5 mg via ORAL
  Filled 2021-02-18: qty 2
  Filled 2021-02-18 (×3): qty 1

## 2021-02-18 MED ORDER — ACETAMINOPHEN 650 MG RE SUPP: 650.0000 mg | Freq: Four times a day (QID) | RECTAL | Status: DC | PRN

## 2021-02-18 MED ORDER — TOPIRAMATE 25 MG PO TABS
25.0000 mg | ORAL_TABLET | Freq: Every day | ORAL | Status: DC
  Administered 2021-02-18 – 2021-02-19 (×2): 25 mg via ORAL
  Filled 2021-02-18 (×3): qty 1

## 2021-02-18 MED ORDER — PANTOPRAZOLE SODIUM 40 MG PO TBEC
40.0000 mg | DELAYED_RELEASE_TABLET | Freq: Two times a day (BID) | ORAL | Status: DC
  Administered 2021-02-18 – 2021-02-20 (×4): 40 mg via ORAL
  Filled 2021-02-18 (×4): qty 1

## 2021-02-18 MED ORDER — ONDANSETRON HCL 4 MG PO TABS: 4.0000 mg | ORAL_TABLET | Freq: Four times a day (QID) | ORAL | Status: DC | PRN

## 2021-02-18 MED ORDER — SODIUM CHLORIDE 0.9 % IV SOLN
100.0000 mg | Freq: Two times a day (BID) | INTRAVENOUS | Status: DC
  Administered 2021-02-18 – 2021-02-20 (×5): 100 mg via INTRAVENOUS
  Filled 2021-02-18 (×6): qty 100

## 2021-02-18 MED ORDER — POTASSIUM CHLORIDE CRYS ER 20 MEQ PO TBCR
40.0000 meq | EXTENDED_RELEASE_TABLET | Freq: Once | ORAL | Status: AC
  Administered 2021-02-18: 40 meq via ORAL
  Filled 2021-02-18: qty 2

## 2021-02-18 NOTE — ED Provider Notes (Signed)
[name] Provider Note   CSN: [id number] Arrival date & time: 02/17/21  2310     History Chief Complaint  Patient presents with  . Respiratory Distress  Level 5 caveat due to acuity of condition  [name] is a 54 y.o. female.  The history is provided by the patient and the EMS personnel. The history is limited by the condition of the patient.  Shortness of Breath Severity:  Severe Onset quality:  Sudden Timing:  Constant Progression:  Worsening Chronicity:  New Worsened by:  Nothing Associated symptoms: cough    Patient with history of asthma, chronic pain presents with shortness of breath. Patient arrives via EMS   EMS was called out for shortness of breath.  On their arrival patient O2 sat was in the 70s-80s. Patient was given multiple medications including epinephrine, magnesium, DuoNeb, Solu-Medrol.  She was also placed on noninvasive ventilation. Past Medical History:  Diagnosis Date  . Anemia   . Anxiety   . Asthma   . Chronic back pain   . Chronic neck pain   . Complex regional pain syndrome   . GERD (gastroesophageal reflux disease)   . Hematuria 09/07/2013  . Hypertension   . IBS (irritable bowel syndrome)   . Migraine   . Paresthesia of left arm     Patient Active Problem List   Diagnosis Date Noted  . Prolonged QT interval 03/16/2020  . Asthma, chronic, unspecified asthma severity, with acute exacerbation 05/03/2018  . Chest pain 05/03/2018  . Tobacco abuse 05/03/2018  . Pulmonary infiltrate 05/03/2018  . Acute hypoxemic respiratory failure ([place]) 03/05/2018  . Hypokalemia 03/05/2018  . Depression 03/05/2018  . Anxiety 03/05/2018  . HTN (hypertension) 03/05/2018  . SIRS (systemic inflammatory response syndrome) ([facility])   . SOB (shortness of breath)   . Chronic pain syndrome   . CAP (community acquired pneumonia) 06/05/2016  . Acute respiratory failure with hypoxia ([place]) 06/05/2016  . 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 Spinal stenosis in cervical region 06/27/2014  . Back pain 09/07/2013  . Hematuria 09/07/2013  . Endometrium, polyp 08/17/2013  . Anemia 08/13/2013    Past Surgical History:  Procedure Laterality Date  . DILITATION & CURRETTAGE/HYSTROSCOPY WITH THERMACHOICE ABLATION N/A 08/17/2013   Procedure: DILATATION & CURETTAGE/HYSTEROSCOPY WITH THERMACHOICE ABLATION;  Surgeon: Jonnie Kind, MD;  Location: [facility];  Service: Gynecology;  Laterality: N/A;  18 ml D5W in & out; total therapy time= 9 minutes 33 seconds; temp= 87 degrees celcius  . HERNIA REPAIR     ventral hernia  . POLYPECTOMY N/A 08/17/2013   Procedure: REMOVAL ENDOMETRIAL POLYP;  Surgeon: Jonnie Kind, MD;  Location: [facility];  Service: Gynecology;  Laterality: N/A;  . TUBAL LIGATION    . ULNAR NERVE REPAIR Left    [facility] 2013     OB History    Gravida  4   Para  4   Term  4   Preterm      AB      Living  4     SAB      IAB      Ectopic      Multiple      Live Births              Family History  Problem Relation Age of Onset  . Diabetes Sister   . Hypertension Sister   . Heart disease Sister   . Cancer Sister  brain tumor  . Heart disease Brother   . Diabetes Brother   . Cancer Maternal Aunt        uterine  . Cancer Maternal Grandmother        uterine  . Other Mother        car accident    Social History   Tobacco Use  . Smoking status: Current Every Day Smoker    Packs/day: 1.00    Years: 26.00    Pack years: 26.00    Types: Cigarettes  . Smokeless tobacco: Never Used  . Tobacco comment: 04/23/16 1/2- 3/4 PPD  Substance Use Topics  . Alcohol use: No  . Drug use: No    Home Medications Prior to Admission medications   Medication Sig Start Date End Date Taking? Authorizing Provider  albuterol (PROVENTIL) (2.5 MG/3ML) 0.083% nebulizer solution Take 3 mLs (2.5 mg total) by nebulization every 6 (six) hours as needed for wheezing or  shortness of breath. 03/18/20 04/17/20  Antonieta Pert, MD  ALPRAZolam Duanne Moron) 0.5 MG tablet Take 0.5 mg by mouth 3 (three) times daily as needed for anxiety. 01/07/20   [provider]  cyclobenzaprine (FLEXERIL) 5 MG tablet Take 5 mg by mouth 3 (three) times daily as needed for muscle spasms. 01/07/20   [provider]  divalproex (DEPAKOTE ER) 500 MG 24 hr tablet Take 500 mg by mouth daily. 03/06/18   [provider]  DULoxetine (CYMBALTA) 60 MG capsule Take 1 capsule (60 mg total) by mouth daily. Patient taking differently: Take 60 mg by mouth 2 (two) times daily.  07/29/14   Kirsteins, Luanna Salk, MD  gabapentin (NEURONTIN) 300 MG capsule Take 600 mg by mouth 4 (four) times daily.  05/14/16   [provider]  hydrochlorothiazide (HYDRODIURIL) 50 MG tablet Take 50 mg by mouth daily. 01/18/20   [provider]  Ipratropium-Albuterol (COMBIVENT RESPIMAT) 20-100 MCG/ACT AERS respimat Inhale 1 puff into the lungs every 6 (six) hours as needed for wheezing or shortness of breath. 03/18/20   Antonieta Pert, MD  lisinopril (ZESTRIL) 20 MG tablet Take 20 mg by mouth daily. 01/07/20   [provider]  naloxone [facility]) nasal spray 4 mg/0.1 mL Place 1 spray into the nose daily as needed (overdose).    [provider]  naproxen (NAPROSYN) 500 MG tablet Take 500 mg by mouth 2 (two) times daily with a meal. 02/28/18   [provider]  oxyCODONE (ROXICODONE) 15 MG immediate release tablet Take 15 mg by mouth 4 (four) times daily as needed for pain.    [provider]  pantoprazole (PROTONIX) 40 MG tablet Take 1 tablet (40 mg total) by mouth 2 (two) times daily before a meal. 05/05/18   Mikhail, Velta Addison, DO  predniSONE (DELTASONE) 10 MG tablet Take PO 4 tabs daily x 3 days,3 tabs daily x 3 days,2 tabs daily x 3 days,1 tab daily x 2 days then stop. 03/18/20   Antonieta Pert, MD  promethazine (PHENERGAN) 25 MG tablet Take 25 mg by mouth every 6 (six) hours as  needed for nausea or vomiting.    [provider]  SYMBICORT 160-4.5 MCG/ACT inhaler Inhale 2 puffs into the lungs daily. 01/17/20   [provider]  topiramate (TOPAMAX) 25 MG tablet Take 25 mg by mouth at bedtime. 11/11/19   [provider]  VENTOLIN HFA 108 (90 Base) MCG/ACT inhaler Inhale 1 puff into the lungs every 4 (four) hours as needed for shortness of breath or wheezing.  01/11/18   [provider]  zolpidem (AMBIEN) 10 MG tablet Take 10 mg by mouth at bedtime as needed for sleep.    [provider]    Allergies    Other  Review of Systems   Review of Systems  Unable to perform ROS: Acuity of condition  Respiratory: Positive for cough and shortness of breath.     Physical Exam Updated Vital Signs BP 110/62   Pulse 88   Temp 98.7 F (37.1 C)   Resp (!) 24   Ht 1.676 m (5\' 6" )   Wt 99.8 kg   SpO2 97%   BMI 35.51 kg/m   Physical Exam CONSTITUTIONAL: Well developed, ill-appearing HEAD: Normocephalic/atraumatic EYES: EOMI/PERRL ENMT: BiPAP mask in place NECK: supple no meningeal signs SPINE/BACK:entire spine nontender CV: S1/S2 noted, no murmurs/rubs/gallops noted LUNGS: Coarse/harsh wheezing bilaterally ABDOMEN: soft, nontender, no rebound or guarding, bowel sounds noted throughout abdomen GU:no cva tenderness NEURO: Pt is awake/alert/appropriate, moves all extremitiesx4.  No facial droop.   EXTREMITIES: pulses normal/equal, full ROM, no significant lower extremity edema SKIN: warm, color normal PSYCH: Unable to assess ED Results / Procedures / Treatments   Labs (all labs ordered are listed, but only abnormal results are displayed) Labs Reviewed  CBC WITH DIFFERENTIAL/PLATELET - Abnormal; Notable for the following components:      Result Value   WBC 13.4 (*)    Hemoglobin 11.6 (*)    HCT 35.5 (*)    Neutro Abs 9.9 (*)    Abs Immature Granulocytes 0.08 (*)    All other components within normal limits  BRAIN  NATRIURETIC PEPTIDE - Abnormal; Notable for the following components:   B Natriuretic Peptide 145.5 (*)    All other components within normal limits  BASIC METABOLIC PANEL - Abnormal; Notable for the following components:   Sodium 134 (*)    Potassium 2.5 (*)    Glucose, Bld 157 (*)    Calcium 8.6 (*)    All other components within normal limits  MAGNESIUM - Abnormal; Notable for the following components:   Magnesium 2.8 (*)    All other components within normal limits  I-STAT ARTERIAL BLOOD GAS, ED - Abnormal; Notable for the following components:   Bicarbonate 28.3 (*)    Acid-Base Excess 3.0 (*)    Potassium 2.6 (*)    HCT 31.0 (*)    Hemoglobin 10.5 (*)    All other components within normal limits  RESP PANEL BY RT-PCR (FLU A&B, COVID) [id number]    EKG EKG Interpretation  Date/Time:  Saturday February 17 2021 23:52:06 EDT Ventricular Rate:  95 PR Interval:  122 QRS Duration: 103 QT Interval:  467 QTC Calculation: 588 R Axis:   64 Text Interpretation: Sinus rhythm Minimal ST depression, diffuse leads Prolonged QT interval Confirmed by Ripley Fraise [phone]) on 02/17/2021 11:55:33 PM   Radiology DG Chest Port 1 View  Result Date: 02/17/2021 CLINICAL DATA:  Shortness of breath EXAM: PORTABLE CHEST 1 VIEW COMPARISON:  03/15/2020 FINDINGS: Bilateral hazy opacities right greater than left. No pneumothorax or pleural effusion. Cardiomediastinal contours are normal. IMPRESSION: Bilateral hazy opacities, concerning for multifocal infection. Electronically Signed   By: Ulyses Jarred M.D.   On: 02/17/2021 23:52    Procedures .Critical Care Performed by: Ripley Fraise, MD Authorized by: Ripley Fraise, MD   Critical care provider statement:    Critical care time (minutes):  58   Critical care start time:  02/18/2021 12:24 AM   Critical care end time:  02/18/2021 1:22 AM   Critical care time was exclusive of:  Separately billable procedures and treating other patients   Critical  care was necessary to treat or prevent imminent or life-threatening deterioration of the following conditions:  Respiratory failure and metabolic crisis   Critical care was time spent personally by me on the following activities:  Ordering and review of laboratory studies, ordering and performing treatments and interventions, ordering and review of radiographic studies, pulse oximetry, re-evaluation of patient's condition, evaluation of patient's response to treatment, discussions with consultants, development of treatment plan with patient or surrogate, examination of patient, obtaining history from patient or surrogate and review of old charts   I assumed direction of critical care for this patient from another provider in my specialty: no     Care discussed with: admitting provider       Medications Ordered in ED Medications  potassium chloride 10 mEq in 100 mL IVPB (10 mEq Intravenous New Bag/Given 02/18/21 0045)  ondansetron (ZOFRAN) injection 4 mg (4 mg Intravenous Given 02/17/21 2322)    ED Course  I have reviewed the triage vital signs and the nursing notes.  Pertinent labs & imaging results that were available during my care of the patient were reviewed by me and considered in my medical decision making (see chart for details).    MDM Rules/Calculators/A&P                          12:24 AM Patient presented in respiratory distress.  She is now improving on BiPAP.  Lung sounds were abnormal, and chest x-ray was reviewed personally and it reveals abnormal findings.  However on review of chart, it appears she has had multiple abnormal chest imaging in the past and may have interstitial lung disease. Will defer antibiotics for now, as this may be an acute on chronic condition.  We will keep patient on BiPAP for now.  Patient is also noted to have hypokalemia 1:23 AM Patient resting comfortably on noninvasive ventilation.  Her vital signs of improved. Arterial blood gas does not reveal  hypercapnia. Patient will be admitted for acute respiratory failure due to hypoxia.  Discussed the case with Dr. Tonie Griffith for admission Pt has no other acute complaints at this time Final Clinical Impression(s) / ED Diagnoses Final diagnoses:  Acute respiratory failure with hypoxia ([place])  Hypokalemia    Rx / DC Orders ED Discharge Orders    None       Ripley Fraise, MD 02/18/21 0124

## 2021-02-18 NOTE — ED Notes (Signed)
Lab called to add-on magnesium.

## 2021-02-18 NOTE — ED Notes (Signed)
This RN and Remo Lipps RT tried to transition patient off bipap. Pt not appearing to be labored while breathing, but still had inspiratory and expiratory wheezing. Initiated patient at 3 L O2 by [place] and then moved to 5 L. O2 sat stayed between 85-90%. Patient was placed back on bipap and RT administered a nebulizer tx.

## 2021-02-18 NOTE — H&P (Signed)
History and Physical    Joan [name] JSH:[id number] DOB: 12-17-1965 DOA: 02/17/2021  PCP: Larina Earthly, MD   Patient coming from:  Home  Chief Complaint: SOB, wheezing, cough  HPI: Joan [name] is a 55 y.o. female with medical history significant for Asthma, HTN, IBS, GERD, anxiety who presents by EMS for evaluation of SOB. She states she was at work when she started to feel hot and developed shortness of breath. Over a few hours her condition worsened and she was coughing with a thicker than normal phlem production and wheezing. She states she felt hot and had chills but did not take her temperature. 911 was called. She was found to have an O2 sat of 70-80% by EMS. She was given epinephrine, magnesium, solumedrol and duonebs and placed on CPAP in route to hospital. She did not have any exposure to new chemicals, fumes or plants she states. She was placed on Bipap in the ER and breathing has improved.  She denies having any chest pain or palpitations.  She is lethargic and falls asleep when being talked to but wakes up to voice.  She reports smoking back of cigarettes a day although she states last few days she has smoked very little.  She denies alcohol or illicit drug use.   ED Course:  In the ER she was started on Bipap and given breathing treatments. She has improved breath sounds but still requiring Bipap.  WBC 13,400, hemoglobin 11.6, hematocrit 35.5, platelets 230,000.  BNP 145.5.  134, potassium 2.5, chloride 100, bicarb 23, glucose 157, creatinine 0.93, BUN 10, magnesium 2.8 calcium 8.6.  COVID-19 swab is negative.  Influenza A and B are negative.  Hospitalist service asked to admit for further management  Review of Systems:  General: Reports chills. Denies fever, weight loss, night sweats.  Denies dizziness.  HENT: Denies head trauma, headache, denies change in hearing, tinnitus. Denies nasal bleeding. Denies sore throat.  Denies difficulty swallowing Eyes: Denies blurry vision,  pain in eye, drainage. Denies discoloration of eyes. Neck: Denies pain.  Denies swelling.  Denies pain with movement. Cardiovascular: Denies chest pain, palpitations.  Denies edema.  Denies orthopnea Respiratory: Reports shortness of breath, cough with thick phlem. Reports wheezing.   Gastrointestinal: Denies abdominal pain, swelling. Denies vomiting, diarrhea. Denies melena. Denies hematemesis. Musculoskeletal: Denies limitation of movement. Denies deformity or swelling. Denies arthralgias or myalgias. Genitourinary: Denies pelvic pain.  Denies urinary frequency or hesitancy.  Denies dysuria.  Skin: Denies rash.  Denies petechiae, purpura, ecchymosis. Neurological: Denies syncope. Denies seizure activity. Denies weakness or paresthesia. Denies slurred speech, drooping face.  Denies visual change. Psychiatric: Denies depression, anxiety. Denies hallucinations.  Past Medical History:  Diagnosis Date  . Anemia   . Anxiety   . Asthma   . Chronic back pain   . Chronic neck pain   . Complex regional pain syndrome   . GERD (gastroesophageal reflux disease)   . Hematuria 09/07/2013  . Hypertension   . IBS (irritable bowel syndrome)   . Migraine   . Paresthesia of left arm     Past Surgical History:  Procedure Laterality Date  . DILITATION & CURRETTAGE/HYSTROSCOPY WITH THERMACHOICE ABLATION N/A 08/17/2013   Procedure: DILATATION & CURETTAGE/HYSTEROSCOPY WITH THERMACHOICE ABLATION;  Surgeon: Jonnie Kind, MD;  Location: [facility];  Service: Gynecology;  Laterality: N/A;  18 ml D5W in & out; total therapy time= 9 minutes 33 seconds; temp= 87 degrees celcius  . HERNIA REPAIR     ventral hernia  .  POLYPECTOMY N/A 08/17/2013   Procedure: REMOVAL ENDOMETRIAL POLYP;  Surgeon: Jonnie Kind, MD;  Location: [facility];  Service: Gynecology;  Laterality: N/A;  . TUBAL LIGATION    . ULNAR NERVE REPAIR Left    [facility] 2013    Social History  reports that she has been smoking cigarettes. She has a 26.00  pack-year smoking history. She has never used smokeless tobacco. She reports that she does not drink alcohol and does not use drugs.  Allergies  Allergen Reactions  . Other Other (See Comments)    IV meds for migraine, patient stated it made her feel like something was crawling all over her body.    Family History  Problem Relation Age of Onset  . Diabetes Sister   . Hypertension Sister   . Heart disease Sister   . Cancer Sister        brain tumor  . Heart disease Brother   . Diabetes Brother   . Cancer Maternal Aunt        uterine  . Cancer Maternal Grandmother        uterine  . Other Mother        car accident     Prior to Admission medications   Medication Sig Start Date End Date Taking? Authorizing Provider  albuterol (PROVENTIL) (2.5 MG/3ML) 0.083% nebulizer solution Take 3 mLs (2.5 mg total) by nebulization every 6 (six) hours as needed for wheezing or shortness of breath. 03/18/20 04/17/20  Antonieta Pert, MD  ALPRAZolam Duanne Moron) 0.5 MG tablet Take 0.5 mg by mouth 3 (three) times daily as needed for anxiety. 01/07/20   [provider]  cyclobenzaprine (FLEXERIL) 5 MG tablet Take 5 mg by mouth 3 (three) times daily as needed for muscle spasms. 01/07/20   [provider]  divalproex (DEPAKOTE ER) 500 MG 24 hr tablet Take 500 mg by mouth daily. 03/06/18   [provider]  DULoxetine (CYMBALTA) 60 MG capsule Take 1 capsule (60 mg total) by mouth daily. Patient taking differently: Take 60 mg by mouth 2 (two) times daily.  07/29/14   Kirsteins, Luanna Salk, MD  gabapentin (NEURONTIN) 300 MG capsule Take 600 mg by mouth 4 (four) times daily.  05/14/16   [provider]  hydrochlorothiazide (HYDRODIURIL) 50 MG tablet Take 50 mg by mouth daily. 01/18/20   [provider]  Ipratropium-Albuterol (COMBIVENT RESPIMAT) 20-100 MCG/ACT AERS respimat Inhale 1 puff into the lungs every 6 (six) hours as needed for wheezing or shortness of breath. 03/18/20   Antonieta Pert, MD  lisinopril (ZESTRIL) 20 MG tablet Take 20 mg by mouth daily. 01/07/20   [provider]  naloxone [facility]) nasal spray 4 mg/0.1 mL Place 1 spray into the nose daily as needed (overdose).    [provider]  naproxen (NAPROSYN) 500 MG tablet Take 500 mg by mouth 2 (two) times daily with a meal. 02/28/18   [provider]  oxyCODONE (ROXICODONE) 15 MG immediate release tablet Take 15 mg by mouth 4 (four) times daily as needed for pain.    [provider]  pantoprazole (PROTONIX) 40 MG tablet Take 1 tablet (40 mg total) by mouth 2 (two) times daily before a meal. 05/05/18   Mikhail, Velta Addison, DO  predniSONE (DELTASONE) 10 MG tablet Take PO 4 tabs daily x 3 days,3 tabs daily x 3 days,2 tabs daily x 3 days,1 tab daily x 2 days then stop. 03/18/20   Antonieta Pert, MD  promethazine (PHENERGAN) 25 MG tablet  Take 25 mg by mouth every 6 (six) hours as needed for nausea or vomiting.    [provider]  SYMBICORT 160-4.5 MCG/ACT inhaler Inhale 2 puffs into the lungs daily. 01/17/20   [provider]  topiramate (TOPAMAX) 25 MG tablet Take 25 mg by mouth at bedtime. 11/11/19   [provider]  VENTOLIN HFA 108 (90 Base) MCG/ACT inhaler Inhale 1 puff into the lungs every 4 (four) hours as needed for shortness of breath or wheezing. 01/11/18   [provider]  zolpidem (AMBIEN) 10 MG tablet Take 10 mg by mouth at bedtime as needed for sleep.    [provider]    Physical Exam: Vitals:   02/18/21 0000 02/18/21 0015 02/18/21 0030 02/18/21 0045  BP: 110/64 110/62 101/60 104/60  Pulse: 93 88 82 84  Resp: (!) 29 (!) 24 (!) 36 13  Temp:      SpO2: 97% 97% 98% 96%  Weight:      Height:        Constitutional: NAD, calm Vitals:   02/18/21 0000 02/18/21 0015 02/18/21 0030 02/18/21 0045  BP: 110/64 110/62 101/60 104/60  Pulse: 93 88 82 84  Resp: (!) 29 (!) 24 (!) 36 13  Temp:      SpO2: 97% 97% 98% 96%  Weight:      Height:        General: WDWN, lethargic and tired appearing, Alert and oriented x3.  Eyes: EOMI, PERRL, conjunctivae normal.  Sclera nonicteric HENT:  [place]/AT, external ears normal. Nares patent without epistasis.  Bipap mask in good position Neck: Soft, normal range of motion, supple, no masses, Trachea midline Respiratory: Diminished breath sounds bilaterally. Diffuse coarse rales and expiratory wheezing, no crackles. Normal respiratory effort. No accessory muscle use.  Cardiovascular: Regular rate and rhythm, no murmurs / rubs / gallops. No extremity edema. 2+ pedal pulses. Abdomen: Soft, no tenderness, nondistended, no rebound or guarding.  No masses palpated. Obese. Bowel sounds normoactive Musculoskeletal: FROM. no cyanosis. No joint deformity upper and lower extremities. Normal muscle tone.  Skin: Warm, dry, intact no rashes, lesions, ulcers. No induration Neurologic: CN 2-12 grossly intact. Slow speech. Sensation intact, Strength 5/5 in all extremities. Falls asleep during exam but wakes up to verbal stimulation.   Psychiatric:  Normal mood. Flat affect   Labs on Admission: I have personally reviewed following labs and imaging studies  CBC: Recent Labs  Lab 02/17/21 2317  WBC 13.4*  NEUTROABS 9.9*  HGB 11.6*  HCT 35.5*  MCV 86.0  PLT 035    Basic Metabolic Panel: Recent Labs  Lab 02/17/21 2317  NA 134*  K 2.5*  CL 100  CO2 23  GLUCOSE 157*  BUN 10  CREATININE 0.93  CALCIUM 8.6*  MG 2.8*    GFR: Estimated Creatinine Clearance: 84.3 mL/min (by C-G formula based on SCr of 0.93 mg/dL).  Liver Function Tests: No results for input(s): AST, ALT, ALKPHOS, BILITOT, PROT, ALBUMIN in the last 168 hours.  Urine analysis:    Component Value Date/Time   COLORURINE YELLOW 06/05/2016 0051   APPEARANCEUR CLOUDY (A) 06/05/2016 0051   LABSPEC 1.016 06/05/2016 0051   PHURINE 6.0 06/05/2016 0051   GLUCOSEU NEGATIVE 06/05/2016 0051   HGBUR NEGATIVE 06/05/2016 0051   BILIRUBINUR NEGATIVE  06/05/2016 0051   KETONESUR NEGATIVE 06/05/2016 0051   PROTEINUR NEGATIVE 06/05/2016 0051   UROBILINOGEN 1.0 07/31/2015 2028   NITRITE NEGATIVE 06/05/2016 0051   LEUKOCYTESUR TRACE (A) 06/05/2016 0051  Radiological Exams on Admission: DG Chest Port 1 View  Result Date: 02/17/2021 CLINICAL DATA:  Shortness of breath EXAM: PORTABLE CHEST 1 VIEW COMPARISON:  03/15/2020 FINDINGS: Bilateral hazy opacities right greater than left. No pneumothorax or pleural effusion. Cardiomediastinal contours are normal. IMPRESSION: Bilateral hazy opacities, concerning for multifocal infection. Electronically Signed   By: Ulyses Jarred M.D.   On: 02/17/2021 23:52    EKG: Independently reviewed.  EKG shows normal sinus rhythm with no acute ST elevation or depression.  QTc 443  Assessment/Plan Principal Problem:   Acute respiratory failure with hypoxia  Ms. Zambrana is admitted to progressive care unit. She is on Bipap which she is tolerating well. ABG ordered by ER physician and pending.  Will monitor closely and wean bipap as tolerated in morning.  Solumedrol given in ER. Give solumedrol Q8 hour for two more doses.  Dulera bid with aerochamber. Albuterol every two hours as needed for SOB, wheezing  Active Problems:   CAP (community acquired pneumonia) CXR shows bilateral infiltrates. She has hx of possible interstitial lung disease so may not be true infiltrates as previous CXR's had similar appearance. Will need outpatient follow up for further workup of possible interstitial lung disease.      Hypokalemia Pt given supplemental potassium in ER. Continue IVF with NS with 40 mEq of potassium.  Recheck electrolytes and renal function in am. Magnesium level is normal.    HTN (hypertension) Continue home medication of lisinopril and HCTZ. Monitor BP.    Asthma, chronic, unspecified asthma severity, with acute exacerbation Continue MDI therapy. Uses symbicort at home which is converted to [facility] while  admitted due to hospital formulary. Albuterol as needed.     Tobacco abuse Nicotine patch to prevent withdrawals. Pt encouraged to discontinue tobacco use. Smoking cessation education before discharge.     DVT prophylaxis: Lovenox for DVT prophylaxis Code Status:   Full code Family Communication:  Diagnosis and plan discussed with patient.  She verbalizes understanding and agrees with plan.  Further recommendations to follow as clinically indicated Disposition Plan:   Patient is from:  Home  Anticipated DC to:  Home  Anticipated DC date:  Anticipate 2 or midnight stay   Anticipated DC barriers: No barriers to discharge identified at this time  Admission status:  Inpatient   Yevonne Aline Kaloni Bisaillon MD [facility]  How to contact the [facility] Attending or Consulting provider [place] or covering provider during after hours [place], for this patient?   1. Check the care team in [facility] and look for a) attending/consulting [facility] provider listed and b) the [facility] team listed 2. Log into [email or web address] and use [place]'s universal password to access. If you do not have the password, please contact the hospital operator. 3. Locate the [facility] provider you are looking for under [facility] and page to a number that you can be directly reached. 4. If you still have difficulty reaching the provider, please page the [facility] (Director on Call) for the Hospitalists listed on amion for assistance.  02/18/2021, 1:11 AM

## 2021-02-18 NOTE — Progress Notes (Signed)
Same day note  Patient seen and examined at bedside.  Patient was admitted to the hospital for shortness of breath  At the time of my evaluation, patient complains of feeling little better on BiPAP, denies chest pain, feels tightness in the chest.  Physical examination reveals female on BiPAP, chest with diminished breath sound and mild rhonchi.  Able to converse in full sentences on BiPAP  Laboratory data and imaging was reviewed  Assessment and Plan.  Acute respiratory failure with hypoxia  Secondary to community-acquired pneumonia with underlying pulmonary condition, currently on BiPAP we will try to wean her oxygen saturation significantly weaned off we will continue with BiPAP, frequent nebulizers, inhaled steroids, IV Solu-Medrol, continue monitor closely.  Resume home medications to decrease anxiety.    CAP (community acquired pneumonia) CXR shows bilateral infiltrates.  Unsure whether this is true infiltrate since her previous x-ray had similar appearance.  In the event of acute flare will empirically treat with Rocephin and doxycycline.  Hypokalemia Significantly low in the ED.  Received IV potassium supplement as well.  Recheck in the evening and replenish again if necessary.  Will give 2 g of magnesium sulfate as well.  Check levels in a.m.    HTN (hypertension) Will hold HCTZ for now due to hypokalemia.  Continue lisinopril.  Check blood pressure closely.    Asthma, chronic, unspecified asthma severity, with acute exacerbation . Uses symbicort at home.  Will change to nebulizer.    Tobacco abuse Nicotine patch.  Counseling done.  Anxiety, chronic neck pain, back pain.  Will resume Xanax Flexeril and duloxetine.  Resume Neurontin as well.  Resume narcotics.   Signed,  Delila Pereyra, MD [facility]

## 2021-02-19 ENCOUNTER — Inpatient Hospital Stay (HOSPITAL_COMMUNITY): Payer: Medicare Other

## 2021-02-19 DIAGNOSIS — R0609 Other forms of dyspnea: Secondary | ICD-10-CM

## 2021-02-19 LAB — CBC WITH DIFFERENTIAL/PLATELET
Abs Immature Granulocytes: 0.69 10*3/uL — ABNORMAL HIGH (ref 0.00–0.07)
Basophils Absolute: 0 10*3/uL (ref 0.0–0.1)
Basophils Relative: 0 %
Eosinophils Absolute: 0.1 10*3/uL (ref 0.0–0.5)
Eosinophils Relative: 0 %
HCT: 42.8 % (ref 36.0–46.0)
Hemoglobin: 13.6 g/dL (ref 12.0–15.0)
Immature Granulocytes: 3 %
Lymphocytes Relative: 6 %
Lymphs Abs: 1.4 10*3/uL (ref 0.7–4.0)
MCH: 27.7 pg (ref 26.0–34.0)
MCHC: 31.8 g/dL (ref 30.0–36.0)
MCV: 87.2 fL (ref 80.0–100.0)
Monocytes Absolute: 0.4 10*3/uL (ref 0.1–1.0)
Monocytes Relative: 2 %
Neutro Abs: 20.2 10*3/uL — ABNORMAL HIGH (ref 1.7–7.7)
Neutrophils Relative %: 89 %
Platelets: 320 10*3/uL (ref 150–400)
RBC: 4.91 MIL/uL (ref 3.87–5.11)
RDW: 15.3 % (ref 11.5–15.5)
WBC: 22.7 10*3/uL — ABNORMAL HIGH (ref 4.0–10.5)
nRBC: 0.1 % (ref 0.0–0.2)

## 2021-02-19 LAB — COMPREHENSIVE METABOLIC PANEL
ALT: 21 U/L (ref 0–44)
AST: 43 U/L — ABNORMAL HIGH (ref 15–41)
Albumin: 2.6 g/dL — ABNORMAL LOW (ref 3.5–5.0)
Alkaline Phosphatase: 77 U/L (ref 38–126)
Anion gap: 6 (ref 5–15)
BUN: 16 mg/dL (ref 6–20)
CO2: 24 mmol/L (ref 22–32)
Calcium: 8.9 mg/dL (ref 8.9–10.3)
Chloride: 110 mmol/L (ref 98–111)
Creatinine, Ser: 0.72 mg/dL (ref 0.44–1.00)
GFR, Estimated: >60 mL/min (ref >60)
Glucose, Bld: 161 mg/dL — ABNORMAL HIGH (ref 70–99)
Potassium: 4.4 mmol/L (ref 3.5–5.1)
Sodium: 140 mmol/L (ref 135–145)
Total Bilirubin: 0.3 mg/dL (ref 0.3–1.2)
Total Protein: 6.5 g/dL (ref 6.5–8.1)

## 2021-02-19 LAB — ECHOCARDIOGRAM COMPLETE
AR max vel: 2.68 cm2
AV Area VTI: 2.56 cm2
AV Area mean vel: 2.44 cm2
AV Mean grad: 3 mmHg
AV Peak grad: 6.1 mmHg
Ao pk vel: 1.23 m/s
Area-P 1/2: 2.8 cm2
Height: 66 in
S' Lateral: 3.2 cm
Weight: 3520.31 oz

## 2021-02-19 LAB — MAGNESIUM: Magnesium: 2.5 mg/dL — ABNORMAL HIGH (ref 1.7–2.4)

## 2021-02-19 MED ORDER — PERFLUTREN LIPID MICROSPHERE
1.0000 mL | INTRAVENOUS | Status: AC | PRN
  Administered 2021-02-19: 4 mL via INTRAVENOUS
  Filled 2021-02-19: qty 10

## 2021-02-19 NOTE — Progress Notes (Signed)
Pt has Bipap PRN order. No distress noted at this time.

## 2021-02-19 NOTE — Progress Notes (Signed)
Pt has PRN Bipap orders.  Pt in no distress at this time.  

## 2021-02-19 NOTE — Progress Notes (Signed)
PROGRESS NOTE  Joan [name] XFG:[id number] DOB: 1966-01-04 DOA: 02/17/2021 PCP: Larina Earthly, MD   LOS: 1 day   Brief narrative:  Joan [name] is a 55 y.o. female with medical history significant for Asthma, HTN, IBS, GERD, anxiety who presented to the hospital with shortness of breath cough chills.  EMS was called in and her pulse ox was 70 to 80%.  She was initially given epinephrine, magnesium sulfate, Solu-Medrol and duo nebs and was put on CPAP in route to the hospital.  the ED she was placed on BiPAP. Patient continues to smoke at home.  Initial WBC count was slightly elevated.  Potassium was low at 2.5.  COVID was negative.  Influenza was negative as well.  Patient was then admitted to hospital for acute hypoxic respiratory failure with possible pneumonia on BiPAP.  Assessment/Plan:  Principal Problem:   Acute respiratory failure with hypoxia (HCC) Active Problems:   CAP (community acquired pneumonia)   Hypokalemia   HTN (hypertension)   Asthma, chronic, unspecified asthma severity, with acute exacerbation   Tobacco abuse  Acute respiratory failure with hypoxia  Secondary to community-acquired pneumonia with underlying history of asthma.  Was initially on BiPAP which has been weaned off.  Continue frequent nebulizers inhaled steroids, IV Solu-Medrol.  Restarted home medications.  Currently on high flow nasal cannula.  Continue antibiotics for pneumonia.  2D echocardiogram with LV ejection fraction of 60 to 65%.  CAP (community acquired pneumonia) CXR shows bilateral infiltrates.  Unsure whether this is true infiltrate since her previous x-ray had similar appearance.  Will empirically treat with Rocephin and doxycycline for now.  Hypokalemia Significantly low on presentation.  Has improved with supplementation.  Potassium of 4.4 today.  EssentialHTN  Continue lisinopril HCTZ.  Blood pressure is stable at this time.  Asthma, chronic, unspecified asthma severity,  with acute exacerbation Steroid nebulizer, IV Solu-Medrol.  Still got of wheezing.  Tobacco abuse Nicotine patch.  Counseling done.  Anxiety, chronic neck pain, back pain.    Resumed Xanax Flexeril and duloxetine, Neurontin and narcotics from home.  DVT prophylaxis: enoxaparin (LOVENOX) injection 40 mg Start: 02/18/21 1000   Code Status: Full code  Family Communication: Unable to reach the patient's son Mr. Bennett Scrape on the phone listed.  Status is: Inpatient  Remains inpatient appropriate because:IV treatments appropriate due to intensity of illness or inability to take PO and Inpatient level of care appropriate due to severity of illness   Dispo: The patient is from: Home              Anticipated d/c is to: Home              Patient currently is not medically stable to d/c.   Difficult to place patient No   Consultants:  None  Procedures:  BiPAP placement  Anti-infectives:  Marland Kitchen Doxycycline and Rocephin  Anti-infectives (From admission, onward)   Start     Dose/Rate Route Frequency Ordered Stop   02/18/21 0500  doxycycline (VIBRAMYCIN) 100 mg in sodium chloride 0.9 % 250 mL IVPB        100 mg 125 mL/hr over 120 Minutes Intravenous 2 times daily 02/18/21 0353     02/18/21 0400  cefTRIAXone (ROCEPHIN) 1 g in sodium chloride 0.9 % 100 mL IVPB        1 g 200 mL/hr over 30 Minutes Intravenous Every 24 hours 02/18/21 0353       Subjective: Today, patient was seen and examined at bedside.  Complains of cough and wheezing and mild shortness of breath on nasal cannula oxygen  Objective: Vitals:   02/19/21 0823 02/19/21 1026  BP:  113/73  Pulse:    Resp:    Temp:    SpO2: 100%     Intake/Output Summary (Last 24 hours) at 02/19/2021 1436 Last data filed at 02/19/2021 0527 Gross per 24 hour  Intake 250 ml  Output --  Net 250 ml   Filed Weights   02/17/21 2314  Weight: 99.8 kg   Body mass index is 35.51 kg/m.   Physical Exam: GENERAL: Patient is alert awake  and oriented. Not in obvious distress.  Obese, on high flow nasal cannula oxygen HENT: No scleral pallor or icterus. Pupils equally reactive to light. Oral mucosa is moist NECK: is supple, no gross swelling noted. CHEST: Diminished breath sounds bilaterally, rhonchi noted  CVS: S1 and S2 heard, no murmur. Regular rate and rhythm.  ABDOMEN: Soft, non-tender, bowel sounds are present. EXTREMITIES: No edema. CNS: Cranial nerves are intact. No focal motor deficits. SKIN: warm and dry without rashes.  Data Review: I have personally reviewed the following laboratory data and studies,  CBC: Recent Labs  Lab 02/17/21 2317 02/18/21 0114 02/18/21 0354 02/19/21 1159  WBC 13.4*  --  11.2* 22.7*  NEUTROABS 9.9*  --   --  20.2*  HGB 11.6* 10.5* 8.4* 13.6  HCT 35.5* 31.0* 26.1* 42.8  MCV 86.0  --  88.8 87.2  PLT 230  --  167 702   Basic Metabolic Panel: Recent Labs  Lab 02/17/21 2317 02/18/21 0114 02/18/21 1126 02/19/21 0726  NA 134* 136 136 140  K 2.5* 2.6* 3.2* 4.4  CL 100  --  103 110  CO2 23  --  22 24  GLUCOSE 157*  --  172* 161*  BUN 10  --  14 16  CREATININE 0.93  --  0.71 0.72  CALCIUM 8.6*  --  7.9* 8.9  MG 2.8*  --  2.5* 2.5*   Liver Function Tests: Recent Labs  Lab 02/19/21 0726  AST 43*  ALT 21  ALKPHOS 77  BILITOT 0.3  PROT 6.5  ALBUMIN 2.6*   No results for input(s): LIPASE, AMYLASE in the last 168 hours. No results for input(s): AMMONIA in the last 168 hours. Cardiac Enzymes: No results for input(s): CKTOTAL, CKMB, CKMBINDEX, TROPONINI in the last 168 hours. BNP (last 3 results) Recent Labs    03/15/20 2350 02/17/21 2317  BNP 52.2 145.5*    ProBNP (last 3 results) No results for input(s): PROBNP in the last 8760 hours.  CBG: No results for input(s): GLUCAP in the last 168 hours. Recent Results (from the past 240 hour(s))  Resp Panel by RT-PCR (Flu A&B, Covid) Nasopharyngeal Swab     Status: None   Collection Time: 02/17/21 11:18 PM   Specimen:  Nasopharyngeal Swab; Nasopharyngeal(NP) swabs in vial transport medium  Result Value Ref Range Status   SARS Coronavirus 2 by RT PCR NEGATIVE NEGATIVE Final    Comment: (NOTE) SARS-CoV-2 target nucleic acids are NOT DETECTED.  The SARS-CoV-2 RNA is generally detectable in upper respiratory specimens during the acute phase of infection. The lowest concentration of SARS-CoV-2 viral copies this assay can detect is 138 copies/mL. A negative result does not preclude SARS-Cov-2 infection and should not be used as the sole basis for treatment or other patient management decisions. A negative result may occur with  improper specimen collection/handling, submission of specimen other than nasopharyngeal swab,  presence of viral mutation(s) within the areas targeted by this assay, and inadequate number of viral copies(<138 copies/mL). A negative result must be combined with clinical observations, patient history, and epidemiological information. The expected result is Negative.  Fact Sheet for Patients:  EntrepreneurPulse.com.au  Fact Sheet for Healthcare Providers:  IncredibleEmployment.be  This test is no t yet approved or cleared by the [place] FDA and  has been authorized for detection and/or diagnosis of SARS-CoV-2 by FDA under an Emergency Use Authorization (EUA). This EUA will remain  in effect (meaning this test can be used) for the duration of the COVID-19 declaration under Section 564(b)(1) of the Act, 21 U.S.C.section 360bbb-3(b)(1), unless the authorization is terminated  or revoked sooner.       Influenza A by PCR NEGATIVE NEGATIVE Final   Influenza B by PCR NEGATIVE NEGATIVE Final    Comment: (NOTE) The Xpert Xpress SARS-CoV-2/FLU/RSV plus assay is intended as an aid in the diagnosis of influenza from Nasopharyngeal swab specimens and should not be used as a sole basis for treatment. Nasal washings and aspirates are unacceptable for  Xpert Xpress SARS-CoV-2/FLU/RSV testing.  Fact Sheet for Patients: EntrepreneurPulse.com.au  Fact Sheet for Healthcare Providers: IncredibleEmployment.be  This test is not yet approved or cleared by the [place] FDA and has been authorized for detection and/or diagnosis of SARS-CoV-2 by FDA under an Emergency Use Authorization (EUA). This EUA will remain in effect (meaning this test can be used) for the duration of the COVID-19 declaration under Section 564(b)(1) of the Act, 21 U.S.C. section 360bbb-3(b)(1), unless the authorization is terminated or revoked.  Performed at [facility], [place] [address] [place], [place] [address]      Studies: DG Chest Port 1 View  Result Date: 02/17/2021 CLINICAL DATA:  Shortness of breath EXAM: PORTABLE CHEST 1 VIEW COMPARISON:  03/15/2020 FINDINGS: Bilateral hazy opacities right greater than left. No pneumothorax or pleural effusion. Cardiomediastinal contours are normal. IMPRESSION: Bilateral hazy opacities, concerning for multifocal infection. Electronically Signed   By: Ulyses Jarred M.D.   On: 02/17/2021 23:52   ECHOCARDIOGRAM COMPLETE  Result Date: 02/19/2021    ECHOCARDIOGRAM REPORT   Patient Name:   Joan [name] Date of Exam: 02/19/2021 Medical Rec #:  [id number]       Height:       66.0 in Accession #:    [id number]      Weight:       220.0 lb Date of Birth:  04/15/1966       BSA:          2.083 m Patient Age:    79 years        BP:           110/71 mmHg Patient Gender: F               HR:           74 bpm. Exam Location:  Inpatient Procedure: 2D Echo, Cardiac Doppler and Color Doppler Indications:    Dyspnea  History:        Patient has no prior history of Echocardiogram examinations.                 Risk Factors:Hypertension and Current Smoker.  Sonographer:    Cammy Brochure Referring Phys: [id number] [place]  1. Left ventricular ejection fraction, by estimation, is 60 to 65%. The  left ventricle has normal function. The left ventricle has no regional wall motion abnormalities.  Left ventricular diastolic parameters were normal.  2. Right ventricular systolic function is normal. The right ventricular size is normal.  3. Left atrial size was mildly dilated.  4. In image 82, there is significant color flow near the intraatrial septum, accentuated with respiration. This is likely venous inflow into the RA, but as septum position not well seen, cannot exclude intra-atrial shunting.  5. Right atrial size was mildly dilated.  6. The mitral valve is normal in structure. Trivial mitral valve regurgitation. No evidence of mitral stenosis.  7. The aortic valve is grossly normal. There is mild calcification of the aortic valve. There is mild thickening of the aortic valve. Aortic valve regurgitation is not visualized. Mild aortic valve sclerosis is present, with no evidence of aortic valve stenosis.  8. The inferior vena cava is normal in size with greater than 50% respiratory variability, suggesting right atrial pressure of 3 mmHg. Comparison(s): No prior Echocardiogram. Conclusion(s)/Recommendation(s): Otherwise normal echocardiogram, with minor abnormalities described in the report. Overall normal echo. See comment re: flow near intra-atrial septum, suspect this is venous inflow but cannot exclude shunting based on images alone. FINDINGS  Left Ventricle: Left ventricular ejection fraction, by estimation, is 60 to 65%. The left ventricle has normal function. The left ventricle has no regional wall motion abnormalities. Definity contrast agent was given IV to delineate the left ventricular  endocardial borders. The left ventricular internal cavity size was normal in size. There is no left ventricular hypertrophy. Left ventricular diastolic parameters were normal. Right Ventricle: The right ventricular size is normal. Right vetricular wall thickness was not well visualized. Right ventricular systolic  function is normal. Left Atrium: Left atrial size was mildly dilated. Right Atrium: Right atrial size was mildly dilated. Pericardium: There is no evidence of pericardial effusion. Mitral Valve: The mitral valve is normal in structure. Trivial mitral valve regurgitation. No evidence of mitral valve stenosis. Tricuspid Valve: The tricuspid valve is normal in structure. Tricuspid valve regurgitation is trivial. No evidence of tricuspid stenosis. Aortic Valve: The aortic valve is grossly normal. There is mild calcification of the aortic valve. There is mild thickening of the aortic valve. Aortic valve regurgitation is not visualized. Mild aortic valve sclerosis is present, with no evidence of aortic valve stenosis. Aortic valve mean gradient measures 3.0 mmHg. Aortic valve peak gradient measures 6.1 mmHg. Aortic valve area, by VTI measures 2.56 cm. Pulmonic Valve: The pulmonic valve was not well visualized. Pulmonic valve regurgitation is not visualized. No evidence of pulmonic stenosis. Aorta: The aortic root and ascending aorta are structurally normal, with no evidence of dilitation. Venous: The inferior vena cava is normal in size with greater than 50% respiratory variability, suggesting right atrial pressure of 3 mmHg. IAS/Shunts: The interatrial septum was not well visualized.  LEFT VENTRICLE PLAX 2D LVIDd:         5.10 cm  Diastology LVIDs:         3.20 cm  LV e' medial:    7.83 cm/s LV PW:         1.00 cm  LV E/e' medial:  12.2 LV IVS:        1.00 cm  LV e' lateral:   12.70 cm/s LVOT diam:     2.20 cm  LV E/e' lateral: 7.5 LV SV:         67 LV SV Index:   32 LVOT Area:     3.80 cm  RIGHT VENTRICLE  IVC RV Basal diam:  3.70 cm     IVC diam: 1.90 cm RV S prime:     12.50 cm/s TAPSE (M-mode): 3.2 cm LEFT ATRIUM             Index       RIGHT ATRIUM           Index LA diam:        3.40 cm 1.63 cm/m  RA Area:     17.40 cm LA Vol (A2C):   64.6 ml 31.02 ml/m RA Volume:   51.00 ml  24.49 ml/m LA Vol  (A4C):   62.3 ml 29.92 ml/m LA Biplane Vol: 64.8 ml 31.12 ml/m  AORTIC VALVE AV Area (Vmax):    2.68 cm AV Area (Vmean):   2.44 cm AV Area (VTI):     2.56 cm AV Vmax:           123.00 cm/s AV Vmean:          87.400 cm/s AV VTI:            0.260 m AV Peak Grad:      6.1 mmHg AV Mean Grad:      3.0 mmHg LVOT Vmax:         86.60 cm/s LVOT Vmean:        56.200 cm/s LVOT VTI:          0.175 m LVOT/AV VTI ratio: 0.67  AORTA Ao Root diam: 3.10 cm Ao Asc diam:  3.00 cm MITRAL VALVE MV Area (PHT): 2.80 cm     SHUNTS MV Decel Time: 271 msec     Systemic VTI:  0.18 m MV E velocity: 95.40 cm/s   Systemic Diam: 2.20 cm MV A velocity: 101.00 cm/s MV E/A ratio:  0.94 Buford Dresser MD Electronically signed by Buford Dresser MD Signature Date/Time: 02/19/2021/12:18:47 PM    Final       Flora Lipps, MD  [facility] 02/19/2021  If 7PM-7AM, please contact night-coverage

## 2021-02-20 LAB — COMPREHENSIVE METABOLIC PANEL
ALT: 23 U/L (ref 0–44)
AST: 25 U/L (ref 15–41)
Albumin: 2.4 g/dL — ABNORMAL LOW (ref 3.5–5.0)
Alkaline Phosphatase: 70 U/L (ref 38–126)
Anion gap: 8 (ref 5–15)
BUN: 19 mg/dL (ref 6–20)
CO2: 25 mmol/L (ref 22–32)
Calcium: 8.4 mg/dL — ABNORMAL LOW (ref 8.9–10.3)
Chloride: 105 mmol/L (ref 98–111)
Creatinine, Ser: 0.64 mg/dL (ref 0.44–1.00)
GFR, Estimated: >60 mL/min (ref >60)
Glucose, Bld: 138 mg/dL — ABNORMAL HIGH (ref 70–99)
Potassium: 3.9 mmol/L (ref 3.5–5.1)
Sodium: 138 mmol/L (ref 135–145)
Total Bilirubin: 0.5 mg/dL (ref 0.3–1.2)
Total Protein: 6.2 g/dL — ABNORMAL LOW (ref 6.5–8.1)

## 2021-02-20 LAB — CBC WITH DIFFERENTIAL/PLATELET
Abs Immature Granulocytes: 0.47 10*3/uL — ABNORMAL HIGH (ref 0.00–0.07)
Basophils Absolute: 0 10*3/uL (ref 0.0–0.1)
Basophils Relative: 0 %
Eosinophils Absolute: 0 10*3/uL (ref 0.0–0.5)
Eosinophils Relative: 0 %
HCT: 36.6 % (ref 36.0–46.0)
Hemoglobin: 11.9 g/dL — ABNORMAL LOW (ref 12.0–15.0)
Immature Granulocytes: 3 %
Lymphocytes Relative: 9 %
Lymphs Abs: 1.6 10*3/uL (ref 0.7–4.0)
MCH: 27.9 pg (ref 26.0–34.0)
MCHC: 32.5 g/dL (ref 30.0–36.0)
MCV: 85.7 fL (ref 80.0–100.0)
Monocytes Absolute: 0.4 10*3/uL (ref 0.1–1.0)
Monocytes Relative: 2 %
Neutro Abs: 15.6 10*3/uL — ABNORMAL HIGH (ref 1.7–7.7)
Neutrophils Relative %: 86 %
Platelets: 329 10*3/uL (ref 150–400)
RBC: 4.27 MIL/uL (ref 3.87–5.11)
RDW: 15.4 % (ref 11.5–15.5)
WBC: 18 10*3/uL — ABNORMAL HIGH (ref 4.0–10.5)
nRBC: 0 % (ref 0.0–0.2)

## 2021-02-20 LAB — MAGNESIUM: Magnesium: 2.2 mg/dL (ref 1.7–2.4)

## 2021-02-20 MED ORDER — PREDNISONE 10 MG PO TABS: ORAL_TABLET | ORAL | 0 refills | Status: DC

## 2021-02-20 MED ORDER — NICOTINE 14 MG/24HR TD PT24: 14.0000 mg | MEDICATED_PATCH | Freq: Every day | TRANSDERMAL | 0 refills | Status: DC

## 2021-02-20 MED ORDER — NICOTINE POLACRILEX 2 MG MT GUM: 2.0000 mg | CHEWING_GUM | OROMUCOSAL | 0 refills | Status: DC | PRN

## 2021-02-20 MED ORDER — SYMBICORT 160-4.5 MCG/ACT IN AERO: 2.0000 | INHALATION_SPRAY | Freq: Every day | RESPIRATORY_TRACT | 12 refills | Status: DC

## 2021-02-20 MED ORDER — DOXYCYCLINE HYCLATE 100 MG PO TABS: 100.0000 mg | ORAL_TABLET | Freq: Two times a day (BID) | ORAL | 0 refills | Status: AC

## 2021-02-20 NOTE — Progress Notes (Signed)
Patient on 4 liters [place]. Removed at 945. Monitored O2 sats 91-94%. From 1030 to  1145 O2 sats where 95-99 %. No complaints of shortness of breath this morning. Verbalizes feeling anxious to go home today.

## 2021-02-20 NOTE — Discharge Summary (Signed)
Physician Discharge Summary  Joan [name] OVF:[id number] DOB: 04-Aug-1966 DOA: 02/17/2021  PCP: Larina Earthly, MD  Admit date: 02/17/2021 Discharge date: 02/20/2021  Admitted From: Home  Discharge disposition: Home   Recommendations for Outpatient Follow-Up:   . Follow up with your primary care provider in one week.  . Check CBC, BMP, magnesium in the next visit  Discharge Diagnosis:   Principal Problem:   Acute respiratory failure with hypoxia (HCC) Active Problems:   CAP (community acquired pneumonia)   Hypokalemia   HTN (hypertension)   Asthma, chronic, unspecified asthma severity, with acute exacerbation   Tobacco abuse   Discharge Condition: Improved.  Diet recommendation: Low sodium, heart healthy.    Wound care: None.  Code status: Full.   History of Present Illness:   Joan L Oatesis a 55 y.o.femalewith medical history significant forAsthma, HTN, IBS, GERD, anxiety who presented to the hospital with shortness of breath, cough and chills.  EMS was called in and her pulse ox was 70 to 80%.  She was initially given epinephrine, magnesium sulfate, Solu-Medrol and duo nebs and was put on CPAP in route to the hospital.  the ED she was placed on BiPAP. Patient continues to smoke at home.  Initial WBC count was slightly elevated.  Potassium was low at 2.5.  COVID was negative.  Influenza was negative as well.  Patient was then admitted to hospital for acute hypoxic respiratory failure with possible pneumonia on BiPAP.   Hospital Course:   Following conditions were addressed during hospitalization as listed below,  Acute respiratory failure with hypoxia Secondary to community-acquired pneumonia with underlying history of asthma.  Was initially on BiPAP which has been weaned off.    Patient received frequent nebulizers, inhaled steroids, IV Solu-Medrol.  Patient was  restarted on home medications.  Has improved significantly.  Nonhypoxic on without oxygen.  At  this time she is stable for disposition home. 2D echocardiogram with LV ejection fraction of 60 to 65%.  CAP (community acquired pneumonia) CXR shows bilateral infiltrates.Unsure whether this is true infiltrate since her previous x-ray had similar appearance.    Patient was however empirically treated with Rocephin and doxycycline.  Hypokalemia  Has improved with supplementation.  Potassium of 3.9  EssentialHTN  Continue lisinopril/ HCTZ.  Blood pressure is stable at this time.  Asthma, chronic, unspecified asthma severity, with acute exacerbation Patient received steroid nebulizer, IV Solu-Medrol.    Prescribed prednisone taper on discharge.  Tobacco abuse Nicotine patch.Counseling done.  Anxiety,chronic neck pain, backpain.  Resume Xanax, Flexeril and duloxetine, Neurontin and narcotics from home.  Disposition.  At this time, patient is stable for disposition home with outpatient PCP follow-up.  Medical Consultants:    None.  Procedures:    BiPAP Subjective:   Today, patient was seen and examined at bedside.  Feels much better today.  Has less dyspnea or shortness of breath.  Discharge Exam:   Vitals:   02/20/21 0757 02/20/21 0843  BP: 129/87   Pulse: 74   Resp: 19   Temp: 98.7 F (37.1 C)   SpO2: 99% 99%   Vitals:   02/20/21 0017 02/20/21 0402 02/20/21 0757 02/20/21 0843  BP: 119/74 (!) 130/92 129/87   Pulse: 80 84 74   Resp: _0 Temp:  98.9 F (37.2 C) 98.7 F (37.1 C)   TempSrc:  Oral Oral   SpO2: 94% 97% 99% 99%  Weight:      Height:  General: Alert awake, not in obvious distress, obese, on high flow nasal cannula HENT: pupils equally reacting to light,  No scleral pallor or icterus noted. Oral mucosa is moist.  Chest: Decreased breath sounds bilaterally, CVS: S1 &S2 heard. No murmur.  Regular rate and rhythm. Abdomen: Soft, nontender, nondistended.  Bowel sounds are heard.   Extremities: No cyanosis, clubbing or  edema.  Peripheral pulses are palpable. Psych: Alert, awake and oriented, normal mood CNS:  No cranial nerve deficits.  Power equal in all extremities.   Skin: Warm and dry.  No rashes noted.  The results of significant diagnostics from this hospitalization (including imaging, microbiology, ancillary and laboratory) are listed below for reference.     Diagnostic Studies:   DG Chest Port 1 View  Result Date: 02/17/2021 CLINICAL DATA:  Shortness of breath EXAM: PORTABLE CHEST 1 VIEW COMPARISON:  03/15/2020 FINDINGS: Bilateral hazy opacities right greater than left. No pneumothorax or pleural effusion. Cardiomediastinal contours are normal. IMPRESSION: Bilateral hazy opacities, concerning for multifocal infection. Electronically Signed   By: Ulyses Jarred M.D.   On: 02/17/2021 23:52     Labs:   Basic Metabolic Panel: Recent Labs  Lab 02/17/21 2317 02/18/21 0114 02/18/21 1126 02/19/21 0726 02/20/21 0336  NA 134* 136 136 140 138  K 2.5* 2.6* 3.2* 4.4 3.9  CL 100  --  103 110 105  CO2 23  --  _0 GLUCOSE 157*  --  172* 161* 138*  BUN 10  --  _1 CREATININE 0.93  --  0.71 0.72 0.64  CALCIUM 8.6*  --  7.9* 8.9 8.4*  MG 2.8*  --  2.5* 2.5* 2.2   GFR Estimated Creatinine Clearance: 98 mL/min (by C-G formula based on SCr of 0.64 mg/dL). Liver Function Tests: Recent Labs  Lab 02/19/21 0726 02/20/21 0336  AST 43* 25  ALT 21 23  ALKPHOS 77 70  BILITOT 0.3 0.5  PROT 6.5 6.2*  ALBUMIN 2.6* 2.4*   No results for input(s): LIPASE, AMYLASE in the last 168 hours. No results for input(s): AMMONIA in the last 168 hours. Coagulation profile No results for input(s): INR, PROTIME in the last 168 hours.  CBC: Recent Labs  Lab 02/17/21 2317 02/18/21 0114 02/18/21 0354 02/19/21 1159 02/20/21 0336  WBC 13.4*  --  11.2* 22.7* 18.0*  NEUTROABS 9.9*  --   --  20.2* 15.6*  HGB 11.6* 10.5* 8.4* 13.6 11.9*  HCT 35.5* 31.0* 26.1* 42.8 36.6  MCV 86.0  --  88.8 87.2 85.7  PLT  230  --  167 320 329   Cardiac Enzymes: No results for input(s): CKTOTAL, CKMB, CKMBINDEX, TROPONINI in the last 168 hours. BNP: Invalid input(s): POCBNP CBG: No results for input(s): GLUCAP in the last 168 hours. D-Dimer No results for input(s): DDIMER in the last 72 hours. Hgb A1c No results for input(s): HGBA1C in the last 72 hours. Lipid Profile No results for input(s): CHOL, HDL, LDLCALC, TRIG, CHOLHDL, LDLDIRECT in the last 72 hours. Thyroid function studies No results for input(s): TSH, T4TOTAL, T3FREE, THYROIDAB in the last 72 hours.  Invalid input(s): FREET3 Anemia work up No results for input(s): VITAMINB12, FOLATE, FERRITIN, TIBC, IRON, RETICCTPCT in the last 72 hours. Microbiology Recent Results (from the past 240 hour(s))  Resp Panel by RT-PCR (Flu A&B, Covid) Nasopharyngeal Swab     Status: None   Collection Time: 02/17/21 11:18 PM   Specimen: Nasopharyngeal Swab; Nasopharyngeal(NP) swabs in vial transport medium  Result Value Ref Range Status   SARS Coronavirus 2 by RT PCR NEGATIVE NEGATIVE Final    Comment: (NOTE) SARS-CoV-2 target nucleic acids are NOT DETECTED.  The SARS-CoV-2 RNA is generally detectable in upper respiratory specimens during the acute phase of infection. The lowest concentration of SARS-CoV-2 viral copies this assay can detect is 138 copies/mL. A negative result does not preclude SARS-Cov-2 infection and should not be used as the sole basis for treatment or other patient management decisions. A negative result may occur with  improper specimen collection/handling, submission of specimen other than nasopharyngeal swab, presence of viral mutation(s) within the areas targeted by this assay, and inadequate number of viral copies(<138 copies/mL). A negative result must be combined with clinical observations, patient history, and epidemiological information. The expected result is Negative.  Fact Sheet for Patients:   EntrepreneurPulse.com.au  Fact Sheet for Healthcare Providers:  IncredibleEmployment.be  This test is no t yet approved or cleared by the [place] FDA and  has been authorized for detection and/or diagnosis of SARS-CoV-2 by FDA under an Emergency Use Authorization (EUA). This EUA will remain  in effect (meaning this test can be used) for the duration of the COVID-19 declaration under Section 564(b)(1) of the Act, 21 U.S.C.section 360bbb-3(b)(1), unless the authorization is terminated  or revoked sooner.       Influenza A by PCR NEGATIVE NEGATIVE Final   Influenza B by PCR NEGATIVE NEGATIVE Final    Comment: (NOTE) The Xpert Xpress SARS-CoV-2/FLU/RSV plus assay is intended as an aid in the diagnosis of influenza from Nasopharyngeal swab specimens and should not be used as a sole basis for treatment. Nasal washings and aspirates are unacceptable for Xpert Xpress SARS-CoV-2/FLU/RSV testing.  Fact Sheet for Patients: EntrepreneurPulse.com.au  Fact Sheet for Healthcare Providers: IncredibleEmployment.be  This test is not yet approved or cleared by the [place] FDA and has been authorized for detection and/or diagnosis of SARS-CoV-2 by FDA under an Emergency Use Authorization (EUA). This EUA will remain in effect (meaning this test can be used) for the duration of the COVID-19 declaration under Section 564(b)(1) of the Act, 21 U.S.C. section 360bbb-3(b)(1), unless the authorization is terminated or revoked.  Performed at [facility], [place] [address], [place], [place] [address]      Discharge Instructions:   Discharge Instructions    Diet general   Complete by: As directed    Discharge instructions   Complete by: As directed    Follow-up with your primary care physician in 1 week.  Discuss about getting a pulmonary referral.  Complete course of prednisone and antibiotic.  No  overexertion.   Increase activity slowly   Complete by: As directed      Allergies as of 02/20/2021      Reactions   Gabapentin Other (See Comments), Hypertension   Elevated BP requiring doubling of current meds   Other Other (See Comments)   Unnamed IV meds for migraine, patient stated it made her feel like "something was crawling all over" her body      Medication List    STOP taking these medications   zolpidem 10 MG tablet Commonly known as: AMBIEN     TAKE these medications   albuterol 108 (90 Base) MCG/ACT inhaler Commonly known as: VENTOLIN HFA Inhale 2 puffs into the lungs every 4 (four) hours as needed for wheezing or shortness of breath. What changed: Another medication with the same name was removed. Continue taking this medication, and follow the  directions you see here.   albuterol (2.5 MG/3ML) 0.083% nebulizer solution Commonly known as: PROVENTIL Take 3 mLs (2.5 mg total) by nebulization every 6 (six) hours as needed for wheezing or shortness of breath. What changed: Another medication with the same name was removed. Continue taking this medication, and follow the directions you see here.   ALPRAZolam 1 MG tablet Commonly known as: XANAX Take 1 mg by mouth 3 (three) times daily as needed for anxiety. Take 1 mg by mouth at bedtime and an additional 1 mg two times a day as needed for anxiety   ALPRAZolam 0.5 MG tablet Commonly known as: XANAX Take 0.5 mg by mouth 3 (three) times daily as needed for anxiety.   baclofen 10 MG tablet Commonly known as: LIORESAL Take 10 mg by mouth in the morning, at noon, and at bedtime. Morning, afternon, and   Combivent Respimat 20-100 MCG/ACT Aers respimat Generic drug: Ipratropium-Albuterol Inhale 1 puff into the lungs every 6 (six) hours as needed for wheezing or shortness of breath.   cyclobenzaprine 5 MG tablet Commonly known as: FLEXERIL Take 5 mg by mouth 3 (three) times daily as needed for muscle spasms.    divalproex 500 MG 24 hr tablet Commonly known as: DEPAKOTE ER Take 1,000 mg by mouth daily.   doxycycline 100 MG tablet Commonly known as: VIBRA-TABS Take 1 tablet (100 mg total) by mouth 2 (two) times daily for 3 days.   DULoxetine 60 MG capsule Commonly known as: CYMBALTA Take 1 capsule (60 mg total) by mouth daily. What changed: when to take this   gabapentin 300 MG capsule Commonly known as: NEURONTIN Take 600 mg by mouth 3 (three) times daily.   hydrochlorothiazide 50 MG tablet Commonly known as: HYDRODIURIL Take 50 mg by mouth daily.   lamoTRIgine 25 MG tablet Commonly known as: LAMICTAL Take 25 mg by mouth daily.   lisinopril 20 MG tablet Commonly known as: ZESTRIL Take 20 mg by mouth daily.   naloxone 4 MG/0.1ML Liqd nasal spray kit Commonly known as: NARCAN Place 1 spray into the nose as needed (as directed, for overdose).   naproxen 500 MG tablet Commonly known as: NAPROSYN Take 500 mg by mouth 2 (two) times daily with a meal.   nicotine 14 mg/24hr patch Commonly known as: NICODERM CQ - dosed in mg/24 hours Place 1 patch (14 mg total) onto the skin daily. Start taking on: February 21, 2021   nicotine polacrilex 2 MG gum Commonly known as: NICORETTE Take 1 each (2 mg total) by mouth as needed for smoking cessation.   oxyCODONE 15 MG immediate release tablet Commonly known as: ROXICODONE Take 15 mg by mouth 3 (three) times daily.   pantoprazole 40 MG tablet Commonly known as: PROTONIX Take 1 tablet (40 mg total) by mouth 2 (two) times daily before a meal.   predniSONE 10 MG tablet Commonly known as: DELTASONE Take PO 4 tabs daily x 3 days,3 tabs daily x 3 days,2 tabs daily x 3 days,1 tab daily x 2 days then stop.   promethazine 25 MG tablet Commonly known as: PHENERGAN Take 25 mg by mouth every 6 (six) hours as needed for nausea or vomiting.   Symbicort 160-4.5 MCG/ACT inhaler Generic drug: budesonide-formoterol Inhale 2 puffs into the lungs  daily.   topiramate 25 MG tablet Commonly known as: TOPAMAX Take 25 mg by mouth daily as needed (for more than 2 seizures a day).       Follow-up Information  Larina Earthly, MD. Schedule an appointment as soon as possible for a visit in 1 week(s).   Specialty: Family Medicine Why: For regular checkup and blood work and referral to pulmonary Dr. Minette Brine information: [address] [place] [address] [phone]                Time coordinating discharge: 39 minutes  Signed:  Osie Amparo  [facility] 02/20/2021, 11:40 AM

## 2021-04-30 ENCOUNTER — Institutional Professional Consult (permissible substitution): Payer: Medicare Other | Admitting: Pulmonary Disease

## 2021-06-03 ENCOUNTER — Other Ambulatory Visit: Payer: Self-pay

## 2021-06-03 ENCOUNTER — Emergency Department (HOSPITAL_COMMUNITY): Payer: Medicare Other

## 2021-06-03 ENCOUNTER — Encounter (HOSPITAL_COMMUNITY): Payer: Self-pay | Admitting: *Deleted

## 2021-06-03 ENCOUNTER — Inpatient Hospital Stay (HOSPITAL_COMMUNITY)
Admission: EM | Admit: 2021-06-03 | Discharge: 2021-06-07 | DRG: 196 | Disposition: A | Discharge status: Home / Self Care | Payer: Medicare Other | Attending: Internal Medicine | Admitting: Internal Medicine

## 2021-06-03 DIAGNOSIS — J849 Interstitial pulmonary disease, unspecified: Principal | ICD-10-CM | POA: Diagnosis present

## 2021-06-03 DIAGNOSIS — F17210 Nicotine dependence, cigarettes, uncomplicated: Secondary | ICD-10-CM | POA: Diagnosis present

## 2021-06-03 DIAGNOSIS — G894 Chronic pain syndrome: Secondary | ICD-10-CM | POA: Diagnosis present

## 2021-06-03 DIAGNOSIS — R918 Other nonspecific abnormal finding of lung field: Secondary | ICD-10-CM | POA: Diagnosis present

## 2021-06-03 DIAGNOSIS — Z833 Family history of diabetes mellitus: Secondary | ICD-10-CM | POA: Diagnosis not present

## 2021-06-03 DIAGNOSIS — Z809 Family history of malignant neoplasm, unspecified: Secondary | ICD-10-CM

## 2021-06-03 DIAGNOSIS — K589 Irritable bowel syndrome without diarrhea: Secondary | ICD-10-CM | POA: Diagnosis present

## 2021-06-03 DIAGNOSIS — F32A Depression, unspecified: Secondary | ICD-10-CM | POA: Diagnosis present

## 2021-06-03 DIAGNOSIS — J9601 Acute respiratory failure with hypoxia: Secondary | ICD-10-CM | POA: Diagnosis not present

## 2021-06-03 DIAGNOSIS — E876 Hypokalemia: Secondary | ICD-10-CM | POA: Diagnosis present

## 2021-06-03 DIAGNOSIS — Z20822 Contact with and (suspected) exposure to covid-19: Secondary | ICD-10-CM | POA: Diagnosis present

## 2021-06-03 DIAGNOSIS — F419 Anxiety disorder, unspecified: Secondary | ICD-10-CM | POA: Diagnosis not present

## 2021-06-03 DIAGNOSIS — J9621 Acute and chronic respiratory failure with hypoxia: Secondary | ICD-10-CM | POA: Diagnosis present

## 2021-06-03 DIAGNOSIS — G43909 Migraine, unspecified, not intractable, without status migrainosus: Secondary | ICD-10-CM | POA: Diagnosis present

## 2021-06-03 DIAGNOSIS — D649 Anemia, unspecified: Secondary | ICD-10-CM | POA: Diagnosis present

## 2021-06-03 DIAGNOSIS — I10 Essential (primary) hypertension: Secondary | ICD-10-CM | POA: Diagnosis not present

## 2021-06-03 DIAGNOSIS — Z888 Allergy status to other drugs, medicaments and biological substances status: Secondary | ICD-10-CM | POA: Diagnosis not present

## 2021-06-03 DIAGNOSIS — E8809 Other disorders of plasma-protein metabolism, not elsewhere classified: Secondary | ICD-10-CM | POA: Diagnosis present

## 2021-06-03 DIAGNOSIS — Z6835 Body mass index (BMI) 35.0-35.9, adult: Secondary | ICD-10-CM

## 2021-06-03 DIAGNOSIS — K219 Gastro-esophageal reflux disease without esophagitis: Secondary | ICD-10-CM | POA: Diagnosis present

## 2021-06-03 DIAGNOSIS — J449 Chronic obstructive pulmonary disease, unspecified: Secondary | ICD-10-CM | POA: Diagnosis present

## 2021-06-03 DIAGNOSIS — F1120 Opioid dependence, uncomplicated: Secondary | ICD-10-CM | POA: Diagnosis present

## 2021-06-03 DIAGNOSIS — Z8249 Family history of ischemic heart disease and other diseases of the circulatory system: Secondary | ICD-10-CM

## 2021-06-03 DIAGNOSIS — D860 Sarcoidosis of lung: Secondary | ICD-10-CM | POA: Diagnosis present

## 2021-06-03 HISTORY — DX: Chronic obstructive pulmonary disease, unspecified: J44.9

## 2021-06-03 LAB — COMPREHENSIVE METABOLIC PANEL
ALT: 18 U/L (ref 0–44)
AST: 51 U/L — ABNORMAL HIGH (ref 15–41)
Albumin: 3 g/dL — ABNORMAL LOW (ref 3.5–5.0)
Alkaline Phosphatase: 54 U/L (ref 38–126)
Anion gap: 10 (ref 5–15)
BUN: 9 mg/dL (ref 6–20)
CO2: 24 mmol/L (ref 22–32)
Calcium: 9 mg/dL (ref 8.9–10.3)
Chloride: 103 mmol/L (ref 98–111)
Creatinine, Ser: 0.66 mg/dL (ref 0.44–1.00)
GFR, Estimated: >60 mL/min (ref >60)
Glucose, Bld: 136 mg/dL — ABNORMAL HIGH (ref 70–99)
Potassium: 3.2 mmol/L — ABNORMAL LOW (ref 3.5–5.1)
Sodium: 137 mmol/L (ref 135–145)
Total Bilirubin: 0.8 mg/dL (ref 0.3–1.2)
Total Protein: 6 g/dL — ABNORMAL LOW (ref 6.5–8.1)

## 2021-06-03 LAB — CBC
HCT: 41.2 % (ref 36.0–46.0)
Hemoglobin: 13.5 g/dL (ref 12.0–15.0)
MCH: 28 pg (ref 26.0–34.0)
MCHC: 32.8 g/dL (ref 30.0–36.0)
MCV: 85.5 fL (ref 80.0–100.0)
Platelets: 252 10*3/uL (ref 150–400)
RBC: 4.82 MIL/uL (ref 3.87–5.11)
RDW: 14.2 % (ref 11.5–15.5)
WBC: 14.3 10*3/uL — ABNORMAL HIGH (ref 4.0–10.5)
nRBC: 0 % (ref 0.0–0.2)

## 2021-06-03 LAB — I-STAT VENOUS BLOOD GAS, ED
Acid-Base Excess: 3 mmol/L — ABNORMAL HIGH (ref 0.0–2.0)
Bicarbonate: 26.3 mmol/L (ref 20.0–28.0)
Calcium, Ion: 1.17 mmol/L (ref 1.15–1.40)
HCT: 38 % (ref 36.0–46.0)
Hemoglobin: 12.9 g/dL (ref 12.0–15.0)
O2 Saturation: 99 %
Potassium: 3.3 mmol/L — ABNORMAL LOW (ref 3.5–5.1)
Sodium: 139 mmol/L (ref 135–145)
TCO2: 27 mmol/L (ref 22–32)
pCO2, Ven: 36.6 mmHg — ABNORMAL LOW (ref 44.0–60.0)
pH, Ven: 7.465 — ABNORMAL HIGH (ref 7.250–7.430)
pO2, Ven: 144 mmHg — ABNORMAL HIGH (ref 32.0–45.0)

## 2021-06-03 LAB — CBC WITH DIFFERENTIAL/PLATELET
Abs Immature Granulocytes: 0.08 10*3/uL — ABNORMAL HIGH (ref 0.00–0.07)
Basophils Absolute: 0.1 10*3/uL (ref 0.0–0.1)
Basophils Relative: 0 %
Eosinophils Absolute: 0 10*3/uL (ref 0.0–0.5)
Eosinophils Relative: 0 %
HCT: 38.2 % (ref 36.0–46.0)
Hemoglobin: 12.4 g/dL (ref 12.0–15.0)
Immature Granulocytes: 1 %
Lymphocytes Relative: 15 %
Lymphs Abs: 2 10*3/uL (ref 0.7–4.0)
MCH: 27.9 pg (ref 26.0–34.0)
MCHC: 32.5 g/dL (ref 30.0–36.0)
MCV: 85.8 fL (ref 80.0–100.0)
Monocytes Absolute: 0.4 10*3/uL (ref 0.1–1.0)
Monocytes Relative: 3 %
Neutro Abs: 11 10*3/uL — ABNORMAL HIGH (ref 1.7–7.7)
Neutrophils Relative %: 81 %
Platelets: 256 10*3/uL (ref 150–400)
RBC: 4.45 MIL/uL (ref 3.87–5.11)
RDW: 14.1 % (ref 11.5–15.5)
WBC: 13.5 10*3/uL — ABNORMAL HIGH (ref 4.0–10.5)
nRBC: 0 % (ref 0.0–0.2)

## 2021-06-03 LAB — BRAIN NATRIURETIC PEPTIDE: B Natriuretic Peptide: 28.3 pg/mL (ref 0.0–100.0)

## 2021-06-03 LAB — BASIC METABOLIC PANEL
Anion gap: 13 (ref 5–15)
BUN: 9 mg/dL (ref 6–20)
CO2: 21 mmol/L — ABNORMAL LOW (ref 22–32)
Calcium: 9.1 mg/dL (ref 8.9–10.3)
Chloride: 103 mmol/L (ref 98–111)
Creatinine, Ser: 0.69 mg/dL (ref 0.44–1.00)
GFR, Estimated: >60 mL/min (ref >60)
Glucose, Bld: 204 mg/dL — ABNORMAL HIGH (ref 70–99)
Potassium: 3.4 mmol/L — ABNORMAL LOW (ref 3.5–5.1)
Sodium: 137 mmol/L (ref 135–145)

## 2021-06-03 LAB — HIV ANTIBODY (ROUTINE TESTING W REFLEX): HIV Screen 4th Generation wRfx: NONREACTIVE

## 2021-06-03 LAB — TROPONIN I (HIGH SENSITIVITY): Troponin I (High Sensitivity): 6 ng/L (ref <18)

## 2021-06-03 LAB — RESP PANEL BY RT-PCR (FLU A&B, COVID) ARPGX2
Influenza A by PCR: NEGATIVE
Influenza B by PCR: NEGATIVE
SARS Coronavirus 2 by RT PCR: NEGATIVE

## 2021-06-03 LAB — PROCALCITONIN: Procalcitonin: 0.88 ng/mL

## 2021-06-03 LAB — STREP PNEUMONIAE URINARY ANTIGEN: Strep Pneumo Urinary Antigen: NEGATIVE

## 2021-06-03 MED ORDER — DULOXETINE HCL 60 MG PO CPEP
60.0000 mg | ORAL_CAPSULE | Freq: Every day | ORAL | Status: DC
  Administered 2021-06-03 – 2021-06-04 (×2): 60 mg via ORAL
  Filled 2021-06-03 (×2): qty 1

## 2021-06-03 MED ORDER — ONDANSETRON HCL 4 MG PO TABS: 4.0000 mg | ORAL_TABLET | Freq: Four times a day (QID) | ORAL | Status: DC | PRN

## 2021-06-03 MED ORDER — TOPIRAMATE 25 MG PO TABS: 25.0000 mg | ORAL_TABLET | Freq: Every day | ORAL | Status: DC | PRN

## 2021-06-03 MED ORDER — MOMETASONE FURO-FORMOTEROL FUM 200-5 MCG/ACT IN AERO
2.0000 | INHALATION_SPRAY | Freq: Two times a day (BID) | RESPIRATORY_TRACT | Status: DC
  Administered 2021-06-04 – 2021-06-07 (×7): 2 via RESPIRATORY_TRACT
  Filled 2021-06-03: qty 8.8

## 2021-06-03 MED ORDER — SODIUM CHLORIDE 0.9 % IV SOLN
2.0000 g | Freq: Every day | INTRAVENOUS | Status: DC
  Administered 2021-06-03 – 2021-06-07 (×5): 2 g via INTRAVENOUS
  Filled 2021-06-03 (×5): qty 20

## 2021-06-03 MED ORDER — METHYLPREDNISOLONE SODIUM SUCC 125 MG IJ SOLR
80.0000 mg | INTRAMUSCULAR | Status: AC
Start: 2021-06-03 — End: 2021-06-05
  Administered 2021-06-03 – 2021-06-05 (×3): 80 mg via INTRAVENOUS
  Filled 2021-06-03 (×3): qty 2

## 2021-06-03 MED ORDER — IPRATROPIUM-ALBUTEROL 0.5-2.5 (3) MG/3ML IN SOLN
RESPIRATORY_TRACT | Status: AC
  Administered 2021-06-03: 12 mL
  Filled 2021-06-03: qty 12

## 2021-06-03 MED ORDER — METHYLPREDNISOLONE SODIUM SUCC 40 MG IJ SOLR: 40.0000 mg | Freq: Two times a day (BID) | INTRAMUSCULAR | Status: DC

## 2021-06-03 MED ORDER — SODIUM CHLORIDE 0.9 % IV SOLN
500.0000 mg | Freq: Every day | INTRAVENOUS | Status: DC
  Administered 2021-06-03 – 2021-06-07 (×5): 500 mg via INTRAVENOUS
  Filled 2021-06-03 (×5): qty 500

## 2021-06-03 MED ORDER — ENOXAPARIN SODIUM 40 MG/0.4ML IJ SOSY
40.0000 mg | PREFILLED_SYRINGE | INTRAMUSCULAR | Status: DC
  Administered 2021-06-04 – 2021-06-06 (×3): 40 mg via SUBCUTANEOUS
  Filled 2021-06-03 (×4): qty 0.4

## 2021-06-03 MED ORDER — POTASSIUM CHLORIDE 10 MEQ/100ML IV SOLN
10.0000 meq | INTRAVENOUS | Status: AC
  Administered 2021-06-03 (×3): 10 meq via INTRAVENOUS
  Filled 2021-06-03 (×3): qty 100

## 2021-06-03 MED ORDER — UMECLIDINIUM BROMIDE 62.5 MCG/INH IN AEPB
1.0000 | INHALATION_SPRAY | Freq: Every day | RESPIRATORY_TRACT | Status: DC
  Administered 2021-06-04 – 2021-06-07 (×4): 1 via RESPIRATORY_TRACT
  Filled 2021-06-03: qty 7

## 2021-06-03 MED ORDER — ONDANSETRON HCL 4 MG/2ML IJ SOLN: 4.0000 mg | Freq: Four times a day (QID) | INTRAMUSCULAR | Status: DC | PRN

## 2021-06-03 MED ORDER — LAMOTRIGINE 25 MG PO TABS
25.0000 mg | ORAL_TABLET | Freq: Every day | ORAL | Status: DC
  Administered 2021-06-03 – 2021-06-04 (×2): 25 mg via ORAL
  Filled 2021-06-03 (×2): qty 1

## 2021-06-03 MED ORDER — LACTATED RINGERS IV SOLN: INTRAVENOUS | Status: DC

## 2021-06-03 MED ORDER — ACETAMINOPHEN 325 MG PO TABS
650.0000 mg | ORAL_TABLET | Freq: Four times a day (QID) | ORAL | Status: DC | PRN
  Administered 2021-06-04: 650 mg via ORAL
  Filled 2021-06-03: qty 2

## 2021-06-03 MED ORDER — OXYCODONE HCL 5 MG PO TABS
15.0000 mg | ORAL_TABLET | Freq: Three times a day (TID) | ORAL | Status: DC
  Administered 2021-06-03 – 2021-06-04 (×4): 15 mg via ORAL
  Filled 2021-06-03 (×4): qty 3

## 2021-06-03 MED ORDER — ALBUTEROL SULFATE (2.5 MG/3ML) 0.083% IN NEBU
2.5000 mg | INHALATION_SOLUTION | RESPIRATORY_TRACT | Status: DC | PRN
  Administered 2021-06-04 – 2021-06-05 (×4): 2.5 mg via RESPIRATORY_TRACT
  Filled 2021-06-03 (×4): qty 3

## 2021-06-03 MED ORDER — ALPRAZOLAM 0.5 MG PO TABS
0.5000 mg | ORAL_TABLET | Freq: Three times a day (TID) | ORAL | Status: DC | PRN
  Administered 2021-06-03 – 2021-06-04 (×3): 0.5 mg via ORAL
  Filled 2021-06-03: qty 2
  Filled 2021-06-03: qty 1
  Filled 2021-06-03: qty 2

## 2021-06-03 MED ORDER — METHYLPREDNISOLONE SODIUM SUCC 125 MG IJ SOLR
120.0000 mg | Freq: Every day | INTRAMUSCULAR | Status: DC
  Administered 2021-06-03: 120 mg via INTRAVENOUS
  Filled 2021-06-03: qty 2

## 2021-06-03 MED ORDER — GABAPENTIN 300 MG PO CAPS
300.0000 mg | ORAL_CAPSULE | Freq: Three times a day (TID) | ORAL | Status: DC
  Administered 2021-06-03 – 2021-06-04 (×4): 300 mg via ORAL
  Filled 2021-06-03 (×4): qty 1

## 2021-06-03 MED ORDER — ACETAMINOPHEN 650 MG RE SUPP: 650.0000 mg | Freq: Four times a day (QID) | RECTAL | Status: DC | PRN

## 2021-06-03 MED ORDER — BACLOFEN 10 MG PO TABS
10.0000 mg | ORAL_TABLET | Freq: Three times a day (TID) | ORAL | Status: DC
  Administered 2021-06-03 – 2021-06-04 (×6): 10 mg via ORAL
  Filled 2021-06-03 (×6): qty 1

## 2021-06-03 NOTE — ED Notes (Signed)
Xray at the bedside.

## 2021-06-03 NOTE — ED Notes (Addendum)
Pt off of BiPAP and placed on 15L NRB for PO meds. 100% O2 with no signs of distress. Respirations 25

## 2021-06-03 NOTE — Consult Note (Addendum)
NAME:  Joan [name], MRN:  [id number], DOB:  March 28, 1966, LOS: 0 ADMISSION DATE:  06/03/2021, CONSULTATION DATE:  06/03/21 REFERRING MD:  Alcario Drought, CHIEF COMPLAINT:  shortness of breath    History of Present Illness:  Joan [name] is a 55 year old woman with hx of gerd, HTN, chronic back pain, here with recurrent shortness of breath.  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  Apparently has been hospitalized multiple times with acute resp insufficiency, treated with steroids and ABTX.  Work up has never been completed.  Several days of shortness of breath.   EMS called due to worsening tonight.   Initially sat 70% improved with CPAP, advanced to BIPAP on arrival to ED.  Also received duoneb, 2gm Mg, Solumedrol 125, dulera, albuterol  Also experiencing chest pain.   Interstitial infiltrates initially noted in 2017  Last seen by pulmonary team 03/2020 while experiencing similar acute respiratory failure. They rec Bronch and ebus.   Pertinent  Medical History  Gerd HTN Chronic pain Depression anxiety  Anemia Asthma Smoker Migraines   Meds: albuterol, baclofen, oxycodone, symbicort, alprazolam, flexeril, duloxetine, gabapentin, hydrochlorothiazide, naprosyn, lamictal, lisinopril, combivent, topamax, protonix, phenergan   Significant Hospital Events: Including procedures, antibiotic start and stop dates in addition to other pertinent events     Interim History / Subjective:   Objective   Blood pressure 125/83, pulse 96, temperature 99.8 F (37.7 C), temperature source Axillary, resp. rate 19, height '5\' 6"'$  (1.676 m), weight 99.8 kg, SpO2 99 %.    FiO2 (%):  [60 %] 60 %   Intake/Output Summary (Last 24 hours) at 06/03/2021 0605 Last data filed at 06/03/2021 0541 Gross per 24 hour  Intake 100 ml  Output --  Net 100 ml   Filed Weights   06/03/21 0139  Weight: 99.8 kg    Examination: General: comfortable on bipap,  easily arousable, opens eyes to light touch, but only nods in response to questions.  HENT: ncat  Lungs: B coarse breath sounds and mild wheeze  Cardiovascular: rrr no mgr  Abdomen: nt, nd, nbs  Extremities: no edema no rash no LAD  Neuro: alert and oriented    Echo: 02/19/21  1. Left ventricular ejection fraction, by estimation, is 60 to 65%. The  left ventricle has normal function. The left ventricle has no regional  wall motion abnormalities. Left ventricular diastolic parameters were  normal.   2. Right ventricular systolic function is normal. The right ventricular  size is normal.   3. Left atrial size was mildly dilated.   4. In image 82, there is significant color flow near the intraatrial  septum, accentuated with respiration. This is likely venous inflow into  the RA, but as septum position not well seen, cannot exclude intra-atrial  shunting.   CT chest 03/16/20 IMPRESSION: 1. No evidence of central or segmental pulmonary emboli. 2. Widespread bilateral airspace disease. Differential diagnosis would include multifocal infection, pneumonitis, vasculitis, or less likely edema. Alveolar sarcoid could give this pattern, especially given the concomitant mediastinal lymphadenopathy. Overall, the pattern is similar to numerous prior CTs dating to 2019.  Resolved Hospital Problem list     Assessment & Plan:  Recurrent hyopxemic respiratory failure:  Ongoing interstitial lung disease process.   Acute on chronic respiratory failure.   Hx of these episodes that improve with steroids and antibiotics.   Per former pulm notes:  Rheumatologic work-up in 2019 was negative Hypersensitivity pneumonitis work-up was  negative DDx (as per Dr. Anastasia Pall 03/18/2020 note) includes sarcoidosis, respiratory bronchiolitis ILD, desquamative interstitial pneumonitis, less likely hypersensitivity pneumonitis, eosinophilic pneumonitis or organizing pneumonia or NSIP.  Less likely malignancy. COVID  negative, influenza negative Agree will need bronchoscopy and biopsy when stable.  Currently too hypoxic and requiring bipap, respiratory status is not stable for elective diagnostic bronchoscopy.  Agree with steroids, ongoing treatment for asthma (unclear how/when dx made) Cont duonebs scheduled, as she does have ongoing wheezing, though no increased WOB at this time with the bipap on.   Rec CAP antibiotics given acute worsening, mild leukocytosis.  Checking ACE, strep and leg U ag, sputum gram stain and culture if able to produce sputum.  Hopefully importance of follow up with Pulmonology can be stressed with her and we can accommodate her needs for follow up.    Mild leukocytosis 14 Hypoalbuminemia Hypokalemia  Labs   CBC: Recent Labs  Lab 06/03/21 0136 06/03/21 0159 06/03/21 0341  WBC 13.5*  --  14.3*  NEUTROABS 11.0*  --   --   HGB 12.4 12.9 13.5  HCT 38.2 38.0 41.2  MCV 85.8  --  85.5  PLT 256  --  [id number]    Basic Metabolic Panel: Recent Labs  Lab 06/03/21 0136 06/03/21 0159  NA 137 139  K 3.2* 3.3*  CL 103  --   CO2 24  --   GLUCOSE 136*  --   BUN 9  --   CREATININE 0.66  --   CALCIUM 9.0  --    GFR: Estimated Creatinine Clearance: 96.9 mL/min (by C-G formula based on SCr of 0.66 mg/dL). Recent Labs  Lab 06/03/21 0136 06/03/21 0341  PROCALCITON  --  0.88  WBC 13.5* 14.3*    Liver Function Tests: Recent Labs  Lab 06/03/21 0136  AST 51*  ALT 18  ALKPHOS 54  BILITOT 0.8  PROT 6.0*  ALBUMIN 3.0*   No results for input(s): LIPASE, AMYLASE in the last 168 hours. No results for input(s): AMMONIA in the last 168 hours.  ABG    Component Value Date/Time   PHART 7.380 02/18/2021 0114   PCO2ART 47.9 02/18/2021 0114   PO2ART 98 02/18/2021 0114   HCO3 26.3 06/03/2021 0159   TCO2 27 06/03/2021 0159   ACIDBASEDEF 0.2 05/03/2018 0015   O2SAT 99.0 06/03/2021 0159     Coagulation Profile: No results for input(s): INR, PROTIME in the last 168  hours.  Cardiac Enzymes: No results for input(s): CKTOTAL, CKMB, CKMBINDEX, TROPONINI in the last 168 hours.  HbA1C: No results found for: HGBA1C  CBG: No results for input(s): GLUCAP in the last 168 hours.  Review of Systems:   Chest pain, shortness of breath, otherwise negative.   Past Medical History:  She,  has a past medical history of Anemia, Anxiety, Asthma, Chronic back pain, Chronic neck pain, Complex regional pain syndrome, COPD (chronic obstructive pulmonary disease) ([place]), GERD (gastroesophageal reflux disease), Hematuria (09/07/2013), Hypertension, IBS (irritable bowel syndrome), Migraine, and Paresthesia of left arm.   Surgical History:   Past Surgical History:  Procedure Laterality Date   DILITATION & CURRETTAGE/HYSTROSCOPY WITH THERMACHOICE ABLATION N/A 08/17/2013   Procedure: DILATATION & CURETTAGE/HYSTEROSCOPY WITH THERMACHOICE ABLATION;  Surgeon: Jonnie Kind, MD;  Location: [facility];  Service: Gynecology;  Laterality: N/A;  18 ml D5W in & out; total therapy time= 9 minutes 33 seconds; temp= 87 degrees celcius   HERNIA REPAIR     ventral hernia   POLYPECTOMY N/A 08/17/2013  Procedure: REMOVAL ENDOMETRIAL POLYP;  Surgeon: Jonnie Kind, MD;  Location: [facility];  Service: Gynecology;  Laterality: N/A;   TUBAL LIGATION     ULNAR NERVE REPAIR Left    [facility] 2013     Social History:   reports that she has been smoking cigarettes. She has a 26.00 pack-year smoking history. She has never used smokeless tobacco. She reports that she does not drink alcohol and does not use drugs.   Family History:  Her family history includes Cancer in her maternal aunt, maternal grandmother, and sister; Diabetes in her brother and sister; Heart disease in her brother and sister; Hypertension in her sister; Other in her mother.   Allergies Allergies  Allergen Reactions   Gabapentin Other (See Comments) and Hypertension    Elevated BP requiring doubling of current meds   Other Other  (See Comments)    Unnamed IV meds for migraine, patient stated it made her feel like "something was crawling all over" her body     Home Medications  Prior to Admission medications   Medication Sig Start Date End Date Taking? Authorizing Provider  albuterol (PROVENTIL) (2.5 MG/3ML) 0.083% nebulizer solution Take 3 mLs (2.5 mg total) by nebulization every 6 (six) hours as needed for wheezing or shortness of breath. 03/18/20 06/03/21 Yes Antonieta Pert, MD  albuterol (VENTOLIN HFA) 108 (90 Base) MCG/ACT inhaler Inhale 2 puffs into the lungs every 4 (four) hours as needed for wheezing or shortness of breath.   Yes [provider]  baclofen (LIORESAL) 10 MG tablet Take 10 mg by mouth in the morning, at noon, and at bedtime. Morning, afternon, and   Yes [provider]  oxyCODONE (ROXICODONE) 15 MG immediate release tablet Take 15 mg by mouth 3 (three) times daily.   Yes [provider]  SYMBICORT 160-4.5 MCG/ACT inhaler Inhale 2 puffs into the lungs daily. 02/20/21 02/20/22 Yes Pokhrel, Corrie Mckusick, MD  ALPRAZolam Duanne Moron) 1 MG tablet Take 1 mg by mouth 3 (three) times daily as needed for anxiety.    [provider]  cyclobenzaprine (FLEXERIL) 5 MG tablet Take 5 mg by mouth 3 (three) times daily as needed for muscle spasms. Patient not taking: No sig reported 01/07/20   [provider]  DULoxetine (CYMBALTA) 60 MG capsule Take 1 capsule (60 mg total) by mouth daily. Patient taking differently: Take 60 mg by mouth in the morning and at bedtime. 07/29/14   Kirsteins, Luanna Salk, MD  gabapentin (NEURONTIN) 300 MG capsule Take 600 mg by mouth 3 (three) times daily. 05/14/16   [provider]  hydrochlorothiazide (HYDRODIURIL) 50 MG tablet Take 50 mg by mouth daily. 01/18/20   [provider]  Ipratropium-Albuterol (COMBIVENT RESPIMAT) 20-100 MCG/ACT AERS respimat Inhale 1 puff into the lungs every 6 (six) hours as needed for wheezing or shortness of breath. Patient  not taking: Reported on 06/03/2021 03/18/20   Antonieta Pert, MD  lamoTRIgine (LAMICTAL) 25 MG tablet Take 25 mg by mouth daily.    [provider]  lisinopril (ZESTRIL) 20 MG tablet Take 20 mg by mouth daily. 01/07/20   [provider]  naloxone [facility]) nasal spray 4 mg/0.1 mL Place 1 spray into the nose as needed (as directed, for overdose).    [provider]  naproxen (NAPROSYN) 500 MG tablet Take 500 mg by mouth 2 (two) times daily with a meal. 02/28/18   [provider]  nicotine (NICODERM CQ - DOSED IN MG/24 HOURS) 14 mg/24hr patch Place 1  patch (14 mg total) onto the skin daily. 02/21/21   Pokhrel, Corrie Mckusick, MD  nicotine polacrilex (NICORETTE) 2 MG gum Take 1 each (2 mg total) by mouth as needed for smoking cessation. Patient not taking: Reported on 06/03/2021 02/20/21   Flora Lipps, MD  pantoprazole (PROTONIX) 40 MG tablet Take 1 tablet (40 mg total) by mouth 2 (two) times daily before a meal. Patient not taking: No sig reported 05/05/18   Cristal Ford, DO  predniSONE (DELTASONE) 10 MG tablet Take PO 4 tabs daily x 3 days,3 tabs daily x 3 days,2 tabs daily x 3 days,1 tab daily x 2 days then stop. 02/20/21   Pokhrel, Corrie Mckusick, MD  promethazine (PHENERGAN) 25 MG tablet Take 25 mg by mouth every 6 (six) hours as needed for nausea or vomiting.    [provider]  topiramate (TOPAMAX) 25 MG tablet Take 25 mg by mouth daily as needed (for more than 2 seizures a day). 11/11/19   [provider]     Critical care time: 45 minutes.

## 2021-06-03 NOTE — ED Notes (Signed)
When cleaning room after pt transfer, $20 bill was found in floor. This RN called receiving RN Samanda on 5W to inform that pt's money will be sent with labels via tube station. No other personal belongings located in room after pt transferred from ED. Tubed pt's $20 bill to 5W at 2316pm

## 2021-06-03 NOTE — Progress Notes (Signed)
PROGRESS NOTE    Joan [name]   [id number]  DOB: [birth year]  DOA: 06/03/2021 PCP: Larina Earthly, MD   Brief Narrative:  Joan [name] 55 year old female with hypertension, chronic back pain, GERD, depression and anxiety asthma, IBS who presented to the hospital for shortness of breath.  The patient was recently admitted from 6/4 through 6/7 with similar complaints and treated for community-acquired pneumonia.  On prior admission pulse ox was 70 to 80% initially, she required CPAP on the way to the hospital followed by BiPAP in the ED.  She was given steroids and antibiotics, improved and was discharged in stable condition.  This time she complains of several days of shortness of breath and was noted to have a pulse ox of 70% when EMS arrived.  She was again placed on a CPAP by EMS and then a BiPAP in the ED.  Given Solu-Medrol, duo nebs and albuterol. Of note the patient has had bilateral interstitial infiltrates since 2017.  When evaluated by pulmonary in 7/21 a bronchoscopy and EBUS were recommended. She has had a rheumatological work-up in 2019 noted to be negative.   Subjective: I have evaluated her twice today. She states her dyspnea has improved and is declining to use the BiPAP again. She is also stating that she plans to go home and might leave AMA. Her mother in law who is at the bedside is telling her that she needs to stay in the hospital.    Assessment & Plan:   Principal Problem:   Acute respiratory failure with hypoxia ([place]) -started on BiPAP, Solu-Medrol, ceftriaxone, azithromycin, neb treatments and Incruse Ellipta - Pulmonary following for possible bronchoscopy  Active Problems:   Chronic pain syndrome/  -Home meds include Cymbalta, baclofen, oxycodone 15 mg 3 times daily, Narcan as needed, Neurontin, Flexeril, naproxen - have resumed home meds-     Anxiety -Home meds include alprazolam, lamotrigine, topiramate - have resumed Alprazolam at half dose for  now    HTN (hypertension) -HCTZ and lisinopril on hold  Nicotine abuse - have counseled her.     Time spent in minutes: 35 DVT prophylaxis: enoxaparin (LOVENOX) injection 40 mg Start: 06/03/21 1000 Code Status: Full code Family Communication:  Level of Care: Level of care: Progressive Disposition Plan:  Status is: Inpatient  Remains inpatient appropriate because:IV treatments appropriate due to intensity of illness or inability to take PO and Inpatient level of care appropriate due to severity of illness  Dispo: The patient is from: Home              Anticipated d/c is to: Home              Patient currently is not medically stable to d/c.   Difficult to place patient No      Consultants:  pulmonary Procedures:  none Antimicrobials:  Anti-infectives (From admission, onward)    Start     Dose/Rate Route Frequency Ordered Stop   06/03/21 0645  cefTRIAXone (ROCEPHIN) 2 g in sodium chloride 0.9 % 100 mL IVPB        2 g 200 mL/hr over 30 Minutes Intravenous Daily 06/03/21 0640     06/03/21 0645  azithromycin (ZITHROMAX) 500 mg in sodium chloride 0.9 % 250 mL IVPB        500 mg 250 mL/hr over 60 Minutes Intravenous Daily 06/03/21 0640          Objective: Vitals:   06/03/21 0300 06/03/21 0315 06/03/21 0615 06/03/21 0727  BP:  123/81 125/83 138/89 138/80  Pulse: 94 96 80 79  Resp:  19 (!) 23 (!) 25  Temp:      TempSrc:      SpO2: 100% 99% 100% 97%  Weight:      Height:        Intake/Output Summary (Last 24 hours) at 06/03/2021 0729 Last data filed at 06/03/2021 [id number] Gross per 24 hour  Intake 200 ml  Output --  Net 200 ml   Filed Weights   06/03/21 0139  Weight: 99.8 kg    Examination: General exam: Appears comfortable  HEENT: PERRLA, oral mucosa moist, no sclera icterus or thrush Respiratory system: crackles at bases- Respiratory effort normal. Cardiovascular system: S1 & S2 heard, RRR.   Gastrointestinal system: Abdomen soft, non-tender,  nondistended. Normal bowel sounds. Central nervous system: Alert and oriented. No focal neurological deficits. Extremities: No cyanosis, clubbing or edema Skin: No rashes or ulcers Psychiatry:  Mood & affect appropriate.     Data Reviewed: I have personally reviewed following labs and imaging studies  CBC: Recent Labs  Lab 06/03/21 0136 06/03/21 0159 06/03/21 0341  WBC 13.5*  --  14.3*  NEUTROABS 11.0*  --   --   HGB 12.4 12.9 13.5  HCT 38.2 38.0 41.2  MCV 85.8  --  85.5  PLT 256  --  [id number]   Basic Metabolic Panel: Recent Labs  Lab 06/03/21 0136 06/03/21 0159 06/03/21 0341  NA 137 139 137  K 3.2* 3.3* 3.4*  CL 103  --  103  CO2 24  --  21*  GLUCOSE 136*  --  204*  BUN 9  --  9  CREATININE 0.66  --  0.69  CALCIUM 9.0  --  9.1   GFR: Estimated Creatinine Clearance: 96.9 mL/min (by C-G formula based on SCr of 0.69 mg/dL). Liver Function Tests: Recent Labs  Lab 06/03/21 0136  AST 51*  ALT 18  ALKPHOS 54  BILITOT 0.8  PROT 6.0*  ALBUMIN 3.0*   No results for input(s): LIPASE, AMYLASE in the last 168 hours. No results for input(s): AMMONIA in the last 168 hours. Coagulation Profile: No results for input(s): INR, PROTIME in the last 168 hours. Cardiac Enzymes: No results for input(s): CKTOTAL, CKMB, CKMBINDEX, TROPONINI in the last 168 hours. BNP (last 3 results) No results for input(s): PROBNP in the last 8760 hours. HbA1C: No results for input(s): HGBA1C in the last 72 hours. CBG: No results for input(s): GLUCAP in the last 168 hours. Lipid Profile: No results for input(s): CHOL, HDL, LDLCALC, TRIG, CHOLHDL, LDLDIRECT in the last 72 hours. Thyroid Function Tests: No results for input(s): TSH, T4TOTAL, FREET4, T3FREE, THYROIDAB in the last 72 hours. Anemia Panel: No results for input(s): VITAMINB12, FOLATE, FERRITIN, TIBC, IRON, RETICCTPCT in the last 72 hours. Urine analysis:    Component Value Date/Time   COLORURINE YELLOW 06/05/2016 0051    APPEARANCEUR CLOUDY (A) 06/05/2016 0051   LABSPEC 1.016 06/05/2016 0051   PHURINE 6.0 06/05/2016 0051   GLUCOSEU NEGATIVE 06/05/2016 0051   HGBUR NEGATIVE 06/05/2016 0051   BILIRUBINUR NEGATIVE 06/05/2016 0051   KETONESUR NEGATIVE 06/05/2016 0051   PROTEINUR NEGATIVE 06/05/2016 0051   UROBILINOGEN 1.0 07/31/2015 2028   NITRITE NEGATIVE 06/05/2016 0051   LEUKOCYTESUR TRACE (A) 06/05/2016 0051   Sepsis Labs: '@LABRCNTIP'$ (procalcitonin:4,lacticidven:4) ) Recent Results (from the past 240 hour(s))  Resp Panel by RT-PCR (Flu A&B, Covid) Nasopharyngeal Swab     Status: None   Collection Time: 06/03/21  1:34  AM   Specimen: Nasopharyngeal Swab; Nasopharyngeal(NP) swabs in vial transport medium  Result Value Ref Range Status   SARS Coronavirus 2 by RT PCR NEGATIVE NEGATIVE Final    Comment: (NOTE) SARS-CoV-2 target nucleic acids are NOT DETECTED.  The SARS-CoV-2 RNA is generally detectable in upper respiratory specimens during the acute phase of infection. The lowest concentration of SARS-CoV-2 viral copies this assay can detect is 138 copies/mL. A negative result does not preclude SARS-Cov-2 infection and should not be used as the sole basis for treatment or other patient management decisions. A negative result may occur with  improper specimen collection/handling, submission of specimen other than nasopharyngeal swab, presence of viral mutation(s) within the areas targeted by this assay, and inadequate number of viral copies(<138 copies/mL). A negative result must be combined with clinical observations, patient history, and epidemiological information. The expected result is Negative.  Fact Sheet for Patients:  EntrepreneurPulse.com.au  Fact Sheet for Healthcare Providers:  IncredibleEmployment.be  This test is no t yet approved or cleared by the [place] FDA and  has been authorized for detection and/or diagnosis of SARS-CoV-2 by FDA under  an Emergency Use Authorization (EUA). This EUA will remain  in effect (meaning this test can be used) for the duration of the COVID-19 declaration under Section 564(b)(1) of the Act, 21 U.S.C.section 360bbb-3(b)(1), unless the authorization is terminated  or revoked sooner.       Influenza A by PCR NEGATIVE NEGATIVE Final   Influenza B by PCR NEGATIVE NEGATIVE Final    Comment: (NOTE) The Xpert Xpress SARS-CoV-2/FLU/RSV plus assay is intended as an aid in the diagnosis of influenza from Nasopharyngeal swab specimens and should not be used as a sole basis for treatment. Nasal washings and aspirates are unacceptable for Xpert Xpress SARS-CoV-2/FLU/RSV testing.  Fact Sheet for Patients: EntrepreneurPulse.com.au  Fact Sheet for Healthcare Providers: IncredibleEmployment.be  This test is not yet approved or cleared by the [place] FDA and has been authorized for detection and/or diagnosis of SARS-CoV-2 by FDA under an Emergency Use Authorization (EUA). This EUA will remain in effect (meaning this test can be used) for the duration of the COVID-19 declaration under Section 564(b)(1) of the Act, 21 U.S.C. section 360bbb-3(b)(1), unless the authorization is terminated or revoked.  Performed at [facility], [place] [address] [place], [place] [address]          Radiology Studies: DG Chest Port 1 View  Result Date: 06/03/2021 CLINICAL DATA:  Chest pain and shortness of breath EXAM: PORTABLE CHEST 1 VIEW COMPARISON:  02/17/2021 FINDINGS: Normal heart size. Diffuse bilateral airspace disease throughout both lungs, more prominent on the left. No pleural effusions. No pneumothorax. Mediastinal contours appear intact. IMPRESSION: Bilateral pulmonary parenchymal infiltrates, similar to prior study. Electronically Signed   By: Lucienne Capers M.D.   On: 06/03/2021 01:50      Scheduled Meds:  enoxaparin (LOVENOX) injection  40 mg Subcutaneous  Q24H   methylPREDNISolone (SOLU-MEDROL) injection  120 mg Intravenous Daily   mometasone-formoterol  2 puff Inhalation BID   umeclidinium bromide  1 puff Inhalation Daily   Continuous Infusions:  azithromycin     cefTRIAXone (ROCEPHIN)  IV     lactated ringers 100 mL/hr at 06/03/21 0424   potassium chloride 10 mEq (06/03/21 0656)     LOS: 0 days      Debbe Odea, MD [facility] Pager: [email or web address] 06/03/2021, 7:29 AM

## 2021-06-03 NOTE — ED Notes (Signed)
PT refusing to put back on BiPap. Pt requesting to stay on NRB. PT 02 100% with RR 23 on NRB.

## 2021-06-03 NOTE — Progress Notes (Signed)
Pt arrived to ED on CPAP for resp distress. Pt switched to bipap 18/6, Fio2 .60.  Duoneb given X4.

## 2021-06-03 NOTE — Plan of Care (Signed)
  Problem: Education: Goal: Knowledge of General Education information will improve Description Including pain rating scale, medication(s)/side effects and non-pharmacologic comfort measures Outcome: Progressing   

## 2021-06-03 NOTE — H&P (Addendum)
History and Physical    JULYA OHLSSON [id number] DOB: 30-May-1966 DOA: 06/03/2021  PCP: Larina Earthly, MD  Patient coming from: Home  I have personally briefly reviewed patient's old medical records in [place]  Chief Complaint: Respiratory distress  HPI: Joan [name] is a 55 y.o. 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  The strong suspicion is that patient has undiagnosed (and incompletely treated) pulmonary sarcoidosis, put pt keeps leaving hospitals after feeling better with initial ABx + Steroids during stays before pulmonology can get a bronchoscopy to make formal diagnosis.  (See DC summary on 03/18/2020 as example).  Additionally pt doesn't follow up with pulm as outpt / pulm unable to get in touch with pt after hospital stays (see telephone note 03/27/20).  Pt presents to ED today with respiratory distress.  Pt with SOB over past several days.  Significantly worse today.  EMS called.  Pt satting in 33s on RA, increased WOB with shallow respirations.  Pt started on CPAP, given duoneb, 2g mag, '125mg'$  solumedrol.  Resp status improved en-route.  ROS: positive for severe CP ongoing today.   ED Course: Pt on BIPAP, 40% FIO2 satting 99%.  Pt sleepy but wakes up to voice, answers yes/no questions.  No hypercapnia on VBG, pH neutral.  WBC 13.5k  Initial HR 102 when in respiratory distress, now 96.  Tm 99.8.  COVID neg.  Trop and BNP nl.  CXR once again shows B perihilar infiltrates, similar to all her CXRs for a number of years.   Review of Systems: As per HPI, otherwise all review of systems negative.  Past Medical History:  Diagnosis Date   Anemia    Anxiety    Asthma    Chronic back pain    Chronic neck pain    Complex regional pain syndrome    COPD (chronic obstructive  pulmonary disease) (HCC)    GERD (gastroesophageal reflux disease)    Hematuria 09/07/2013   Hypertension    IBS (irritable bowel syndrome)    Migraine    Paresthesia of left arm     Past Surgical History:  Procedure Laterality Date   DILITATION & CURRETTAGE/HYSTROSCOPY WITH THERMACHOICE ABLATION N/A 08/17/2013   Procedure: DILATATION & CURETTAGE/HYSTEROSCOPY WITH THERMACHOICE ABLATION;  Surgeon: Jonnie Kind, MD;  Location: [facility];  Service: Gynecology;  Laterality: N/A;  18 ml D5W in & out; total therapy time= 9 minutes 33 seconds; temp= 87 degrees celcius   HERNIA REPAIR     ventral hernia   POLYPECTOMY N/A 08/17/2013   Procedure: REMOVAL ENDOMETRIAL POLYP;  Surgeon: Jonnie Kind, MD;  Location: [facility];  Service: Gynecology;  Laterality: N/A;   TUBAL LIGATION     ULNAR NERVE REPAIR Left    [facility] 2013     reports that she has been smoking cigarettes. She has a 26.00 pack-year smoking history. She has never used smokeless tobacco. She reports that she does not drink alcohol and does not use drugs.  Allergies  Allergen Reactions   Gabapentin Other (See Comments) and Hypertension    Elevated BP requiring doubling of current meds   Other Other (See Comments)    Unnamed IV meds for migraine, patient stated it made her feel like "something was crawling all over" her body    Family History  Problem Relation Age of Onset  Diabetes Sister    Hypertension Sister    Heart disease Sister    Cancer Sister        brain tumor   Heart disease Brother    Diabetes Brother    Cancer Maternal Aunt        uterine   Cancer Maternal Grandmother        uterine   Other Mother        car accident     Prior to Admission medications   Medication Sig Start Date End Date Taking? Authorizing Provider  albuterol (PROVENTIL) (2.5 MG/3ML) 0.083% nebulizer solution Take 3 mLs (2.5 mg total) by nebulization every 6 (six) hours as needed for wheezing or shortness of breath. 03/18/20 06/03/21 Yes  Antonieta Pert, MD  albuterol (VENTOLIN HFA) 108 (90 Base) MCG/ACT inhaler Inhale 2 puffs into the lungs every 4 (four) hours as needed for wheezing or shortness of breath.   Yes [provider]  baclofen (LIORESAL) 10 MG tablet Take 10 mg by mouth in the morning, at noon, and at bedtime. Morning, afternon, and   Yes [provider]  oxyCODONE (ROXICODONE) 15 MG immediate release tablet Take 15 mg by mouth 3 (three) times daily.   Yes [provider]  SYMBICORT 160-4.5 MCG/ACT inhaler Inhale 2 puffs into the lungs daily. 02/20/21 02/20/22 Yes Pokhrel, Corrie Mckusick, MD  ALPRAZolam Duanne Moron) 1 MG tablet Take 1 mg by mouth 3 (three) times daily as needed for anxiety.    [provider]  cyclobenzaprine (FLEXERIL) 5 MG tablet Take 5 mg by mouth 3 (three) times daily as needed for muscle spasms. Patient not taking: No sig reported 01/07/20   [provider]  DULoxetine (CYMBALTA) 60 MG capsule Take 1 capsule (60 mg total) by mouth daily. Patient taking differently: Take 60 mg by mouth in the morning and at bedtime. 07/29/14   Kirsteins, Luanna Salk, MD  gabapentin (NEURONTIN) 300 MG capsule Take 600 mg by mouth 3 (three) times daily. 05/14/16   [provider]  hydrochlorothiazide (HYDRODIURIL) 50 MG tablet Take 50 mg by mouth daily. 01/18/20   [provider]  Ipratropium-Albuterol (COMBIVENT RESPIMAT) 20-100 MCG/ACT AERS respimat Inhale 1 puff into the lungs every 6 (six) hours as needed for wheezing or shortness of breath. Patient not taking: Reported on 06/03/2021 03/18/20   Antonieta Pert, MD  lamoTRIgine (LAMICTAL) 25 MG tablet Take 25 mg by mouth daily.    [provider]  lisinopril (ZESTRIL) 20 MG tablet Take 20 mg by mouth daily. 01/07/20   [provider]  naloxone [facility]) nasal spray 4 mg/0.1 mL Place 1 spray into the nose as needed (as directed, for overdose).    [provider]  naproxen (NAPROSYN) 500 MG tablet Take 500 mg by  mouth 2 (two) times daily with a meal. 02/28/18   [provider]  nicotine (NICODERM CQ - DOSED IN MG/24 HOURS) 14 mg/24hr patch Place 1 patch (14 mg total) onto the skin daily. 02/21/21   Pokhrel, Corrie Mckusick, MD  nicotine polacrilex (NICORETTE) 2 MG gum Take 1 each (2 mg total) by mouth as needed for smoking cessation. Patient not taking: Reported on 06/03/2021 02/20/21   Flora Lipps, MD  pantoprazole (PROTONIX) 40 MG tablet Take 1 tablet (40 mg total) by mouth 2 (two) times daily before a meal. Patient not taking: No sig reported 05/05/18   Cristal Ford, DO  predniSONE (DELTASONE) 10 MG tablet Take PO 4 tabs daily x 3 days,3 tabs daily x  3 days,2 tabs daily x 3 days,1 tab daily x 2 days then stop. 02/20/21   Pokhrel, Corrie Mckusick, MD  promethazine (PHENERGAN) 25 MG tablet Take 25 mg by mouth every 6 (six) hours as needed for nausea or vomiting.    [provider]  topiramate (TOPAMAX) 25 MG tablet Take 25 mg by mouth daily as needed (for more than 2 seizures a day). 11/11/19   [provider]    Physical Exam: Vitals:   06/03/21 0200 06/03/21 0230 06/03/21 0300 06/03/21 0315  BP: 118/73 117/74 123/81 125/83  Pulse: 98 96 94 96  Resp: '20 18  19  '$ Temp:      TempSrc:      SpO2: 98% 99% 100% 99%  Weight:      Height:        Constitutional: NAD, calm, comfortable Eyes: PERRL, lids and conjunctivae normal ENMT: Mucous membranes are moist. Posterior pharynx clear of any exudate or lesions.Normal dentition.  Neck: normal, supple, no masses, no thyromegaly Respiratory: diffuse wheezing, no accessory muscle use on BIPAP Cardiovascular: Regular rate and rhythm, no murmurs / rubs / gallops. No extremity edema. 2+ pedal pulses. No carotid bruits.  Abdomen: no tenderness, no masses palpated. No hepatosplenomegaly. Bowel sounds positive.  Musculoskeletal: no clubbing / cyanosis. No joint deformity upper and lower extremities. Good ROM, no contractures. Normal muscle tone.  Skin: no  rashes, lesions, ulcers. No induration Neurologic: CN 2-12 grossly intact. Sensation intact, DTR normal. Strength 5/5 in all 4.  Psychiatric: Sleepy, answering yes/no questions on BIPAP   Labs on Admission: I have personally reviewed following labs and imaging studies  CBC: Recent Labs  Lab 06/03/21 0136 06/03/21 0159  WBC 13.5*  --   NEUTROABS 11.0*  --   HGB 12.4 12.9  HCT 38.2 38.0  MCV 85.8  --   PLT 256  --    Basic Metabolic Panel: Recent Labs  Lab 06/03/21 0136 06/03/21 0159  NA 137 139  K 3.2* 3.3*  CL 103  --   CO2 24  --   GLUCOSE 136*  --   BUN 9  --   CREATININE 0.66  --   CALCIUM 9.0  --    GFR: Estimated Creatinine Clearance: 96.9 mL/min (by C-G formula based on SCr of 0.66 mg/dL). Liver Function Tests: Recent Labs  Lab 06/03/21 0136  AST 51*  ALT 18  ALKPHOS 54  BILITOT 0.8  PROT 6.0*  ALBUMIN 3.0*   No results for input(s): LIPASE, AMYLASE in the last 168 hours. No results for input(s): AMMONIA in the last 168 hours. Coagulation Profile: No results for input(s): INR, PROTIME in the last 168 hours. Cardiac Enzymes: No results for input(s): CKTOTAL, CKMB, CKMBINDEX, TROPONINI in the last 168 hours. BNP (last 3 results) No results for input(s): PROBNP in the last 8760 hours. HbA1C: No results for input(s): HGBA1C in the last 72 hours. CBG: No results for input(s): GLUCAP in the last 168 hours. Lipid Profile: No results for input(s): CHOL, HDL, LDLCALC, TRIG, CHOLHDL, LDLDIRECT in the last 72 hours. Thyroid Function Tests: No results for input(s): TSH, T4TOTAL, FREET4, T3FREE, THYROIDAB in the last 72 hours. Anemia Panel: No results for input(s): VITAMINB12, FOLATE, FERRITIN, TIBC, IRON, RETICCTPCT in the last 72 hours. Urine analysis:    Component Value Date/Time   COLORURINE YELLOW 06/05/2016 0051   APPEARANCEUR CLOUDY (A) 06/05/2016 0051   LABSPEC 1.016 06/05/2016 0051   PHURINE 6.0 06/05/2016 0051   GLUCOSEU NEGATIVE 06/05/2016  0051  HGBUR NEGATIVE 06/05/2016 0051   BILIRUBINUR NEGATIVE 06/05/2016 0051   KETONESUR NEGATIVE 06/05/2016 0051   PROTEINUR NEGATIVE 06/05/2016 0051   UROBILINOGEN 1.0 07/31/2015 2028   NITRITE NEGATIVE 06/05/2016 0051   LEUKOCYTESUR TRACE (A) 06/05/2016 0051    Radiological Exams on Admission: DG Chest Port 1 View  Result Date: 06/03/2021 CLINICAL DATA:  Chest pain and shortness of breath EXAM: PORTABLE CHEST 1 VIEW COMPARISON:  02/17/2021 FINDINGS: Normal heart size. Diffuse bilateral airspace disease throughout both lungs, more prominent on the left. No pleural effusions. No pneumothorax. Mediastinal contours appear intact. IMPRESSION: Bilateral pulmonary parenchymal infiltrates, similar to prior study. Electronically Signed   By: Lucienne Capers M.D.   On: 06/03/2021 01:50    EKG: Independently reviewed.  Assessment/Plan Principal Problem:   Acute respiratory failure with hypoxia (HCC) Active Problems:   Chronic pain syndrome   Anxiety   HTN (hypertension)   Pulmonary infiltrate   Opiate dependence ([place])    Acute respiratory failure with hypoxia - Suspect due to undiagnosed and untreated pulmonary sarcoidosis.  Certainly pt has ILD of some form that has been seen on various CT scans for a number of years. DDx (as per Dr. Anastasia Pall 03/18/2020 note) includes sarcoidosis, respiratory bronchiolitis ILD, desquamative interstitial pneumonitis, less likely hypersensitivity pneumonitis, eosinophilic pneumonitis or organizing pneumonia or NSIP.  Less likely malignancy. COPD pathway LABA/LAMA and INH steroid PRN SABA Systemic solumedrol '60mg'$  Q12H IV (got 125 with EMS). Pulm consult in AM Presumably still needs that bronchoscopy + EBUS with biopsy that pulm thought she needed in June 2021 Will hold off on CT scan since numerous prior CTs have demonstrated similar pattern of disease (see July 1 CT of 2021 for details). Dr. Duwayne Heck recs starting CAP ABx because this is difficult to  rule out. Acute resp failure with hypoxia - Patient has acute respiratory failure with hypoxia due to having a new oxygen requirement.  That is the patient has a PaO2 < 60 (pulse Ox < 90%) on room air. Pt requiring rescue BIPAP CPS and opiate dependence - Hope to resume home meds once pt more alert and able to complete home med rec if able to wean bipap Did verify that she is prescribed '15mg'$  oxycodone QID PRN with PMP aware Anxiety - Hope to resume home meds when pt more alert and able to complete home med rec Did confirm that she is prescribed Xanax '1mg'$  PO TID PRN based off of PMP aware HTN - Again, hope to resume home meds once med rec complete and off BIPAP  DVT prophylaxis: Lovenox Code Status: Full Family Communication: No family in room Disposition Plan: Home after pulm work up, diagnosis, and treatment Consults called: Sent message to Dr. Duwayne Heck for pulm consult in AM Admission status: Admit to inpatient  Severity of Illness: The appropriate patient status for this patient is INPATIENT. Inpatient status is judged to be reasonable and necessary in order to provide the required intensity of service to ensure the patient's safety. The patient's presenting symptoms, physical exam findings, and initial radiographic and laboratory data in the context of their chronic comorbidities is felt to place them at high risk for further clinical deterioration. Furthermore, it is not anticipated that the patient will be medically stable for discharge from the hospital within 2 midnights of admission. The following factors support the patient status of inpatient.   Patient has acute respiratory failure with hypoxia due to having a new oxygen requirement.  That is the patient has a PaO2 <  60 (pulse Ox < 90%) on room air.  Pt requiring rescue BIPAP.   * I certify that at the point of admission it is my clinical judgment that the patient will require inpatient hospital care spanning beyond 2  midnights from the point of admission due to high intensity of service, high risk for further deterioration and high frequency of surveillance required.*   Brekyn Huntoon M. DO [facility]  How to contact the [facility] Attending or Consulting provider [place] or covering provider during after hours [place], for this patient?  Check the care team in [facility] and look for a) attending/consulting [facility] provider listed and b) the [facility] team listed Log into [email or web address]  Amion Physician Scheduling and messaging for groups and whole hospitals  On call and physician scheduling software for group practices, residents, hospitalists and other medical providers for call, clinic, rotation and shift schedules. On[employer] is a hospital-wide system for scheduling doctors and paging doctors on call. EasyPlot is for scientific plotting and data analysis.  [email or web address]  and use [place]'s universal password to access. If you do not have the password, please contact the hospital operator.  Locate the [facility] provider you are looking for under [facility] and page to a number that you can be directly reached. If you still have difficulty reaching the provider, please page the [facility] (Director on Call) for the Hospitalists listed on amion for assistance.  06/03/2021, 3:49 AM

## 2021-06-03 NOTE — ED Notes (Signed)
Ready room for half an hour with no nurse assigned in [place]; call to unit to attempt report but secretary was not able to get a nurse on the line. This RN provided call back number and will try again shortly.

## 2021-06-03 NOTE — ED Triage Notes (Signed)
PT here from home via [facility] from home for sob and chest pain x 3 days.  Pt lethargic with initial sats of 76%.  Arrived on CPAP with reported sats improved to 98%.  Sats of 88% when briefly removed from CPAP.   Given en-route:  2 G magnesium 125 mg solumedrol 1 duonebx

## 2021-06-03 NOTE — ED Notes (Signed)
Messaged attending physician for clarification of dosing schedule for pt's PRN meds per pt request.

## 2021-06-03 NOTE — ED Provider Notes (Signed)
[facility] EMERGENCY DEPARTMENT Provider Note   CSN: [id number] Arrival date & time: 06/03/21  0131     History Chief Complaint  Patient presents with   Respiratory Distress    Joan [name] is a 55 y.o. female.  The history is provided by the EMS personnel, the patient and medical records.  Joan [name] is a 55 y.o. female who presents to the Emergency Department complaining of shortness of breath. Low five caveat due to respiratory distress. Most of the history is obtained by EMS. Patient is able to provide a very small amount of the history. Per EMS that she has been experiencing shortness of breath for the last several days. Today she became significantly worsened and EMS was called. On their arrival her stats were in the 20s and she had increased work of breathing was shallow respirations. She was placed on CPAP and given a duo neb, 2 g mag and 125 mg Solu-Medrol. The report improvement in her respiratory status in route to the hospital. Patient complains of severe chest pain with breathing that has been ongoing today.    Past Medical History:  Diagnosis Date   Anemia    Anxiety    Asthma    Chronic back pain    Chronic neck pain    Complex regional pain syndrome    COPD (chronic obstructive pulmonary disease) (HCC)    GERD (gastroesophageal reflux disease)    Hematuria 09/07/2013   Hypertension    IBS (irritable bowel syndrome)    Migraine    Paresthesia of left arm     Patient Active Problem List   Diagnosis Date Noted   Opiate dependence ([place]) 06/03/2021   ILD (interstitial lung disease) ([place]) 06/03/2021   Prolonged QT interval 03/16/2020   Asthma, chronic, unspecified asthma severity, with acute exacerbation 05/03/2018   Chest pain 05/03/2018   Tobacco abuse 05/03/2018   Pulmonary infiltrate 05/03/2018   Hypokalemia 03/05/2018   Depression 03/05/2018   Anxiety 03/05/2018   HTN (hypertension) 03/05/2018   SIRS (systemic inflammatory  response syndrome) (HCC)    SOB (shortness of breath)    Chronic pain syndrome    CAP (community acquired pneumonia) 06/05/2016   Acute respiratory failure with hypoxia ([place]) 06/05/2016   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) 06/27/2014   Spinal stenosis in cervical region 06/27/2014   Back pain 09/07/2013   Hematuria 09/07/2013   Endometrium, polyp 08/17/2013   Anemia 08/13/2013    Past Surgical History:  Procedure Laterality Date   DILITATION & CURRETTAGE/HYSTROSCOPY WITH THERMACHOICE ABLATION N/A 08/17/2013   Procedure: DILATATION & CURETTAGE/HYSTEROSCOPY WITH THERMACHOICE ABLATION;  Surgeon: Jonnie Kind, MD;  Location: [facility];  Service: Gynecology;  Laterality: N/A;  18 ml D5W in & out; total therapy time= 9 minutes 33 seconds; temp= 87 degrees celcius   HERNIA REPAIR     ventral hernia   POLYPECTOMY N/A 08/17/2013   Procedure: REMOVAL ENDOMETRIAL POLYP;  Surgeon: Jonnie Kind, MD;  Location: [facility];  Service: Gynecology;  Laterality: N/A;   TUBAL LIGATION     ULNAR NERVE REPAIR Left    [facility] 2013     OB History     Gravida  4   Para  4   Term  4   Preterm      AB      Living  4      SAB      IAB      Ectopic  Multiple      Live Births              Family History  Problem Relation Age of Onset   Diabetes Sister    Hypertension Sister    Heart disease Sister    Cancer Sister        brain tumor   Heart disease Brother    Diabetes Brother    Cancer Maternal Aunt        uterine   Cancer Maternal Grandmother        uterine   Other Mother        car accident    Social History   Tobacco Use   Smoking status: Every Day    Packs/day: 1.00    Years: 26.00    Pack years: 26.00    Types: Cigarettes   Smokeless tobacco: Never   Tobacco comments:    04/23/16 1/2- 3/4 PPD  Substance Use Topics   Alcohol use: No   Drug use: No    Home Medications Prior to Admission medications   Medication Sig Start Date End Date Taking?  Authorizing Provider  albuterol (PROVENTIL) (2.5 MG/3ML) 0.083% nebulizer solution Take 3 mLs (2.5 mg total) by nebulization every 6 (six) hours as needed for wheezing or shortness of breath. 03/18/20 06/03/21 Yes Antonieta Pert, MD  albuterol (VENTOLIN HFA) 108 (90 Base) MCG/ACT inhaler Inhale 2 puffs into the lungs every 4 (four) hours as needed for wheezing or shortness of breath.   Yes [provider]  baclofen (LIORESAL) 10 MG tablet Take 10 mg by mouth in the morning, at noon, and at bedtime. Morning, afternon, and   Yes [provider]  oxyCODONE (ROXICODONE) 15 MG immediate release tablet Take 15 mg by mouth 3 (three) times daily.   Yes [provider]  SYMBICORT 160-4.5 MCG/ACT inhaler Inhale 2 puffs into the lungs daily. 02/20/21 02/20/22 Yes Pokhrel, Corrie Mckusick, MD  ALPRAZolam Duanne Moron) 1 MG tablet Take 1 mg by mouth 3 (three) times daily as needed for anxiety.    [provider]  cyclobenzaprine (FLEXERIL) 5 MG tablet Take 5 mg by mouth 3 (three) times daily as needed for muscle spasms. Patient not taking: No sig reported 01/07/20   [provider]  DULoxetine (CYMBALTA) 60 MG capsule Take 1 capsule (60 mg total) by mouth daily. Patient taking differently: Take 60 mg by mouth in the morning and at bedtime. 07/29/14   Kirsteins, Luanna Salk, MD  gabapentin (NEURONTIN) 300 MG capsule Take 600 mg by mouth 3 (three) times daily. 05/14/16   [provider]  hydrochlorothiazide (HYDRODIURIL) 50 MG tablet Take 50 mg by mouth daily. 01/18/20   [provider]  Ipratropium-Albuterol (COMBIVENT RESPIMAT) 20-100 MCG/ACT AERS respimat Inhale 1 puff into the lungs every 6 (six) hours as needed for wheezing or shortness of breath. Patient not taking: Reported on 06/03/2021 03/18/20   Antonieta Pert, MD  lamoTRIgine (LAMICTAL) 25 MG tablet Take 25 mg by mouth daily.    [provider]  lisinopril (ZESTRIL) 20 MG tablet Take 20 mg by mouth daily. 01/07/20    [provider]  naloxone [facility]) nasal spray 4 mg/0.1 mL Place 1 spray into the nose as needed (as directed, for overdose).    [provider]  naproxen (NAPROSYN) 500 MG tablet Take 500 mg by mouth 2 (two) times daily with a meal. 02/28/18   [provider]  nicotine (NICODERM CQ - DOSED IN MG/24 HOURS) 14 mg/24hr patch  Place 1 patch (14 mg total) onto the skin daily. 02/21/21   Pokhrel, Corrie Mckusick, MD  nicotine polacrilex (NICORETTE) 2 MG gum Take 1 each (2 mg total) by mouth as needed for smoking cessation. Patient not taking: Reported on 06/03/2021 02/20/21   Flora Lipps, MD  pantoprazole (PROTONIX) 40 MG tablet Take 1 tablet (40 mg total) by mouth 2 (two) times daily before a meal. Patient not taking: No sig reported 05/05/18   Cristal Ford, DO  predniSONE (DELTASONE) 10 MG tablet Take PO 4 tabs daily x 3 days,3 tabs daily x 3 days,2 tabs daily x 3 days,1 tab daily x 2 days then stop. 02/20/21   Pokhrel, Corrie Mckusick, MD  promethazine (PHENERGAN) 25 MG tablet Take 25 mg by mouth every 6 (six) hours as needed for nausea or vomiting.    [provider]  topiramate (TOPAMAX) 25 MG tablet Take 25 mg by mouth daily as needed (for more than 2 seizures a day). 11/11/19   [provider]    Allergies    Gabapentin and Other  Review of Systems   Review of Systems  Unable to perform ROS: Severe respiratory distress   Physical Exam Updated Vital Signs BP 125/83   Pulse 96   Temp 99.8 F (37.7 C) (Axillary)   Resp 19   Ht '5\' 6"'$  (1.676 m)   Wt 99.8 kg   SpO2 99%   BMI 35.51 kg/m   Physical Exam Vitals and nursing note reviewed.  Constitutional:      General: She is in acute distress.     Appearance: She is well-developed. She is ill-appearing.  HENT:     Head: Normocephalic and atraumatic.  Cardiovascular:     Rate and Rhythm: Normal rate and regular rhythm.     Heart sounds: No murmur heard. Pulmonary:     Effort: Respiratory distress present.      Comments: Decreased air movement bilaterally with occasional wheezes Abdominal:     Palpations: Abdomen is soft.     Tenderness: There is no abdominal tenderness. There is no guarding or rebound.  Musculoskeletal:        General: No swelling or tenderness.     Comments: 2+ radial and DP pulses bilaterally  Skin:    General: Skin is warm and dry.  Neurological:     Mental Status: She is alert and oriented to person, place, and time.  Psychiatric:        Behavior: Behavior normal.    ED Results / Procedures / Treatments   Labs (all labs ordered are listed, but only abnormal results are displayed) Labs Reviewed  COMPREHENSIVE METABOLIC PANEL - Abnormal; Notable for the following components:      Result Value   Potassium 3.2 (*)    Glucose, Bld 136 (*)    Total Protein 6.0 (*)    Albumin 3.0 (*)    AST 51 (*)    All other components within normal limits  CBC WITH DIFFERENTIAL/PLATELET - Abnormal; Notable for the following components:   WBC 13.5 (*)    Neutro Abs 11.0 (*)    Abs Immature Granulocytes 0.08 (*)    All other components within normal limits  CBC - Abnormal; Notable for the following components:   WBC 14.3 (*)    All other components within normal limits  I-STAT VENOUS BLOOD GAS, ED - Abnormal; Notable for the following components:   pH, Ven 7.465 (*)    pCO2, Ven 36.6 (*)    pO2,  Ven 144.0 (*)    Acid-Base Excess 3.0 (*)    Potassium 3.3 (*)    All other components within normal limits  RESP PANEL BY RT-PCR (FLU A&B, COVID) ARPGX2  BRAIN NATRIURETIC PEPTIDE  HIV ANTIBODY (ROUTINE TESTING W REFLEX)  PROCALCITONIN  BASIC METABOLIC PANEL  TROPONIN I (HIGH SENSITIVITY)  TROPONIN I (HIGH SENSITIVITY)    EKG EKG Interpretation  Date/Time:  Sunday June 03 2021 01:36:25 EDT Ventricular Rate:  99 PR Interval:  133 QRS Duration: 94 QT Interval:  342 QTC Calculation: 439 R Axis:   54 Text Interpretation: Sinus rhythm Low voltage, precordial leads  Borderline T wave abnormalities Confirmed by Quintella Reichert [phone]) on 06/03/2021 1:38:18 AM  Radiology DG Chest Port 1 View  Result Date: 06/03/2021 CLINICAL DATA:  Chest pain and shortness of breath EXAM: PORTABLE CHEST 1 VIEW COMPARISON:  02/17/2021 FINDINGS: Normal heart size. Diffuse bilateral airspace disease throughout both lungs, more prominent on the left. No pleural effusions. No pneumothorax. Mediastinal contours appear intact. IMPRESSION: Bilateral pulmonary parenchymal infiltrates, similar to prior study. Electronically Signed   By: Lucienne Capers M.D.   On: 06/03/2021 01:50    Procedures Procedures  CRITICAL CARE Performed by: Quintella Reichert   Total critical care time: 40 minutes  Critical care time was exclusive of separately billable procedures and treating other patients.  Critical care was necessary to treat or prevent imminent or life-threatening deterioration.  Critical care was time spent personally by me on the following activities: development of treatment plan with patient and/or surrogate as well as nursing, discussions with consultants, evaluation of patient's response to treatment, examination of patient, obtaining history from patient or surrogate, ordering and performing treatments and interventions, ordering and review of laboratory studies, ordering and review of radiographic studies, pulse oximetry and re-evaluation of patient's condition.   Medications Ordered in ED Medications  mometasone-formoterol (DULERA) 200-5 MCG/ACT inhaler 2 puff (has no administration in time range)  albuterol (PROVENTIL) (2.5 MG/3ML) 0.083% nebulizer solution 2.5 mg (has no administration in time range)  umeclidinium bromide (INCRUSE ELLIPTA) 62.5 MCG/INH 1 puff (has no administration in time range)  methylPREDNISolone sodium succinate (SOLU-MEDROL) 125 mg/2 mL injection 120 mg (has no administration in time range)  enoxaparin (LOVENOX) injection 40 mg (has no administration in  time range)  acetaminophen (TYLENOL) tablet 650 mg (has no administration in time range)    Or  acetaminophen (TYLENOL) suppository 650 mg (has no administration in time range)  ondansetron (ZOFRAN) tablet 4 mg (has no administration in time range)    Or  ondansetron (ZOFRAN) injection 4 mg (has no administration in time range)  potassium chloride 10 mEq in 100 mL IVPB (has no administration in time range)  lactated ringers infusion (has no administration in time range)  ipratropium-albuterol (DUONEB) 0.5-2.5 (3) MG/3ML nebulizer solution (12 mLs  Given 06/03/21 0147)    ED Course  I have reviewed the triage vital signs and the nursing notes.  Pertinent labs & imaging results that were available during my care of the patient were reviewed by me and considered in my medical decision making (see chart for details).    MDM Rules/Calculators/A&P                           patient with history of asthma here for evaluation of increase shortness of breath, chest tightness. She is ill appearing on ED arrival, on CPAP by EMS. She was transitioned over to BiPAP. Lungs  with decreased air movement bilaterally and she was treated with duo nebs through the circuit.  On re-assessment pt appears partially improved with BIPAP, now with improved air movement bilaterally. CXR with stable infiltrates when compared to priors. Patient states her chest pain has improved with the BiPAP. CBC with mild leukocytosis, similar when compared to priors. Feel PE is unlikely at this time. She has had multiple prior CTA is for similar presentations. Feel that this is likely an underlying issue with lung disease over acute infection or pulmonary edema. Plan to continue treatment with supportive care, steroids and nebs. Medicine consulted for admission for ongoing treatment. Final Clinical Impression(s) / ED Diagnoses Final diagnoses:  Acute respiratory failure with hypoxia [facility])    Rx / DC Orders ED Discharge Orders      None        Quintella Reichert, MD 06/03/21 [phone]

## 2021-06-03 NOTE — ED Notes (Signed)
PT alert and awake. This RN along with Macey, RT removed BiPAP per MD Gardner's request earlier this AM. PT RR jumped to 50's, pt unable to tolerate being off BiPAP. BiPap replaced.

## 2021-06-03 NOTE — ED Notes (Addendum)
Pt requesting MD at bedside. Pt also requesting med for anxiety and oxy for pain. This RN offered PRN tylenol. Pt stated "Tylenol can't do anything for me". MD Rizwan paged.

## 2021-06-04 ENCOUNTER — Inpatient Hospital Stay (HOSPITAL_COMMUNITY): Payer: Medicare Other

## 2021-06-04 DIAGNOSIS — J9601 Acute respiratory failure with hypoxia: Secondary | ICD-10-CM | POA: Diagnosis not present

## 2021-06-04 LAB — CBC
HCT: 41.3 % (ref 36.0–46.0)
Hemoglobin: 13.3 g/dL (ref 12.0–15.0)
MCH: 28.3 pg (ref 26.0–34.0)
MCHC: 32.2 g/dL (ref 30.0–36.0)
MCV: 87.9 fL (ref 80.0–100.0)
Platelets: 273 10*3/uL (ref 150–400)
RBC: 4.7 MIL/uL (ref 3.87–5.11)
RDW: 14.4 % (ref 11.5–15.5)
WBC: 20 10*3/uL — ABNORMAL HIGH (ref 4.0–10.5)
nRBC: 0 % (ref 0.0–0.2)

## 2021-06-04 LAB — BASIC METABOLIC PANEL
Anion gap: 7 (ref 5–15)
BUN: 11 mg/dL (ref 6–20)
CO2: 26 mmol/L (ref 22–32)
Calcium: 8.9 mg/dL (ref 8.9–10.3)
Chloride: 103 mmol/L (ref 98–111)
Creatinine, Ser: 0.66 mg/dL (ref 0.44–1.00)
GFR, Estimated: >60 mL/min (ref >60)
Glucose, Bld: 112 mg/dL — ABNORMAL HIGH (ref 70–99)
Potassium: 4.1 mmol/L (ref 3.5–5.1)
Sodium: 136 mmol/L (ref 135–145)

## 2021-06-04 LAB — LEGIONELLA PNEUMOPHILA SEROGP 1 UR AG: L. pneumophila Serogp 1 Ur Ag: NEGATIVE

## 2021-06-04 LAB — TROPONIN I (HIGH SENSITIVITY): Troponin I (High Sensitivity): 6 ng/L (ref <18)

## 2021-06-04 MED ORDER — ALPRAZOLAM 0.5 MG PO TABS
1.0000 mg | ORAL_TABLET | Freq: Three times a day (TID) | ORAL | Status: DC | PRN
  Administered 2021-06-04 – 2021-06-06 (×7): 1 mg via ORAL
  Filled 2021-06-04 (×8): qty 2

## 2021-06-04 MED ORDER — HYDROCHLOROTHIAZIDE 25 MG PO TABS
50.0000 mg | ORAL_TABLET | Freq: Every day | ORAL | Status: DC
  Administered 2021-06-05 – 2021-06-07 (×3): 50 mg via ORAL
  Filled 2021-06-04 (×3): qty 2

## 2021-06-04 MED ORDER — DIVALPROEX SODIUM 250 MG PO DR TAB
500.0000 mg | DELAYED_RELEASE_TABLET | Freq: Every day | ORAL | Status: DC
  Administered 2021-06-04 – 2021-06-07 (×4): 500 mg via ORAL
  Filled 2021-06-04 (×4): qty 2

## 2021-06-04 MED ORDER — GABAPENTIN 300 MG PO CAPS
600.0000 mg | ORAL_CAPSULE | Freq: Four times a day (QID) | ORAL | Status: DC
  Administered 2021-06-04 – 2021-06-07 (×10): 600 mg via ORAL
  Filled 2021-06-04 (×13): qty 2

## 2021-06-04 MED ORDER — PREDNISONE 5 MG PO TABS
30.0000 mg | ORAL_TABLET | Freq: Every day | ORAL | Status: DC
  Administered 2021-06-06 – 2021-06-07 (×2): 30 mg via ORAL
  Filled 2021-06-04 (×2): qty 1

## 2021-06-04 MED ORDER — PANTOPRAZOLE SODIUM 40 MG PO TBEC
40.0000 mg | DELAYED_RELEASE_TABLET | Freq: Two times a day (BID) | ORAL | Status: DC
  Administered 2021-06-04 – 2021-06-07 (×6): 40 mg via ORAL
  Filled 2021-06-04 (×6): qty 1

## 2021-06-04 MED ORDER — LISINOPRIL 20 MG PO TABS
20.0000 mg | ORAL_TABLET | Freq: Every day | ORAL | Status: DC
  Administered 2021-06-04 – 2021-06-07 (×4): 20 mg via ORAL
  Filled 2021-06-04 (×4): qty 1

## 2021-06-04 MED ORDER — BACLOFEN 10 MG PO TABS
10.0000 mg | ORAL_TABLET | Freq: Four times a day (QID) | ORAL | Status: DC
  Administered 2021-06-05 – 2021-06-07 (×9): 10 mg via ORAL
  Filled 2021-06-04 (×9): qty 1

## 2021-06-04 MED ORDER — GABAPENTIN 600 MG PO TABS: 300.0000 mg | ORAL_TABLET | Freq: Once | ORAL | Status: DC

## 2021-06-04 MED ORDER — GABAPENTIN 300 MG PO CAPS
300.0000 mg | ORAL_CAPSULE | Freq: Once | ORAL | Status: AC
  Administered 2021-06-04: 300 mg via ORAL
  Filled 2021-06-04: qty 1

## 2021-06-04 MED ORDER — DULOXETINE HCL 60 MG PO CPEP
60.0000 mg | ORAL_CAPSULE | Freq: Two times a day (BID) | ORAL | Status: DC
  Administered 2021-06-04 – 2021-06-07 (×6): 60 mg via ORAL
  Filled 2021-06-04 (×6): qty 1

## 2021-06-04 MED ORDER — LAMOTRIGINE 25 MG PO TABS
25.0000 mg | ORAL_TABLET | Freq: Every day | ORAL | Status: DC
  Administered 2021-06-05 – 2021-06-07 (×3): 25 mg via ORAL
  Filled 2021-06-04 (×3): qty 1

## 2021-06-04 MED ORDER — OXYCODONE HCL 5 MG PO TABS
15.0000 mg | ORAL_TABLET | Freq: Four times a day (QID) | ORAL | Status: DC
Start: 2021-06-04 — End: 2021-06-07
  Administered 2021-06-04 – 2021-06-07 (×11): 15 mg via ORAL
  Filled 2021-06-04 (×12): qty 3

## 2021-06-04 MED ORDER — NAPROXEN 250 MG PO TABS
500.0000 mg | ORAL_TABLET | Freq: Every day | ORAL | Status: DC
  Administered 2021-06-05 – 2021-06-07 (×3): 500 mg via ORAL
  Filled 2021-06-04 (×3): qty 2

## 2021-06-04 NOTE — Progress Notes (Signed)
NAME:  Joan [name], MRN:  [id number], DOB:  Feb 07, 1966, LOS: 1 ADMISSION DATE:  06/03/2021, CONSULTATION DATE:  06/03/21 REFERRING MD:  Alcario Drought, CHIEF COMPLAINT:  shortness of breath    History of Present Illness:  Ms. Pollick is a 55 year old woman with hx of gerd, HTN, chronic back pain, here with recurrent shortness of breath.  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  Apparently has been hospitalized multiple times with acute resp insufficiency, treated with steroids and ABTX.  Work up has never been completed.  Several days of shortness of breath.   EMS called due to worsening tonight.   Initially sat 70% improved with CPAP, advanced to BIPAP on arrival to ED.  Also received duoneb, 2gm Mg, Solumedrol 125, dulera, albuterol  Also experiencing chest pain.   Interstitial infiltrates initially noted in 2017  Last seen by pulmonary team 03/2020 while experiencing similar acute respiratory failure. They rec Bronch and ebus.   Pertinent  Medical History  Gerd HTN Chronic pain Depression anxiety  Anemia Asthma Smoker Migraines   Meds: albuterol, baclofen, oxycodone, symbicort, alprazolam, flexeril, duloxetine, gabapentin, hydrochlorothiazide, naprosyn, lamictal, lisinopril, combivent, topamax, protonix, phenergan   Significant Hospital Events: Including procedures, antibiotic start and stop dates in addition to other pertinent events     Interim History / Subjective:  No Givens or night.  Feels like breathing is slowly improving.  Voice stronger.  Not short of breath. Objective   Blood pressure 130/72, pulse 80, temperature 98.6 F (37 C), temperature source Oral, resp. rate 13, height '5\' 6"'$  (1.676 m), weight 99.4 kg, SpO2 94 %.        Intake/Output Summary (Last 24 hours) at 06/04/2021 1640 Last data filed at 06/03/2021 2103 Gross per 24 hour  Intake 30 ml  Output --  Net 30 ml    Filed Weights   06/03/21  0139 06/03/21 2343  Weight: 99.8 kg 99.4 kg    Examination: General: comfortable on nasal cannula, no acute distress.  HENT: ncat  Lungs: Good air movement, bronchial breath sounds on expiration with Cardiovascular: rrr no mgr  Abdomen: nt, nd, nbs  Extremities: no edema no rash no LAD  Neuro: alert and oriented    Echo: 02/19/21  1. Left ventricular ejection fraction, by estimation, is 60 to 65%. The  left ventricle has normal function. The left ventricle has no regional  wall motion abnormalities. Left ventricular diastolic parameters were  normal.   2. Right ventricular systolic function is normal. The right ventricular  size is normal.   3. Left atrial size was mildly dilated.   4. In image 82, there is significant color flow near the intraatrial  septum, accentuated with respiration. This is likely venous inflow into  the RA, but as septum position not well seen, cannot exclude intra-atrial  shunting.   CT chest 03/16/20 IMPRESSION: 1. No evidence of central or segmental pulmonary emboli. 2. Widespread bilateral airspace disease. Differential diagnosis would include multifocal infection, pneumonitis, vasculitis, or less likely edema. Alveolar sarcoid could give this pattern, especially given the concomitant mediastinal lymphadenopathy. Overall, the pattern is similar to numerous prior CTs dating to 2019.  Resolved Hospital Problem list     Assessment & Plan:  Recurrent hypoxemic respite failure with diffuse groundglass opacities bilaterally chronically present since 2017: Suspect intermittent resolution given provement in symptoms with antibiotics, steroids.  Active smoker.  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  Does not  appear infectious to my eye especially given chronicity.  On the differential sarcoidosis but is a bit of atypical pattern.  Eosinophilic pneumonia is possible but given the chronicity of imaging findings, would have to be chronic eosinophilic pneumonia  which would expect circulating peripheral eosinophilia which is not present.  Not consistent with UIP, NSIP.  No cystic lung disease to suggest cystic ILD.  Vasculitis possible but again unlikely given pattern of infiltrate.  Stressed the importance of smoking cessation given high suspicion for smoking-related ILD.  She expressed understanding. --Please provide nicotine patch during admission as well as on discharge --Solu-Medrol 80 mg IV to continue through tomorrow 9/20, this is 3 days, then transition to prednisone 30 mg daily --CT chest --Recommend prednisone 30 mg daily for 1 month with repeat CT scan, if infiltrates resolve then work-up is complete, if infiltrates persist can pursue bronchoscopy at that time  Labs   CBC: Recent Labs  Lab 06/03/21 0136 06/03/21 0159 06/03/21 0341 06/04/21 0023  WBC 13.5*  --  14.3* 20.0*  NEUTROABS 11.0*  --   --   --   HGB 12.4 12.9 13.5 13.3  HCT 38.2 38.0 41.2 41.3  MCV 85.8  --  85.5 87.9  PLT 256  --  252 273     Basic Metabolic Panel: Recent Labs  Lab 06/03/21 0136 06/03/21 0159 06/03/21 0341 06/04/21 0018  NA 137 139 137 136  K 3.2* 3.3* 3.4* 4.1  CL 103  --  103 103  CO2 24  --  21* 26  GLUCOSE 136*  --  204* 112*  BUN 9  --  9 11  CREATININE 0.66  --  0.69 0.66  CALCIUM 9.0  --  9.1 8.9    GFR: Estimated Creatinine Clearance: 96.7 mL/min (by C-G formula based on SCr of 0.66 mg/dL). Recent Labs  Lab 06/03/21 0136 06/03/21 0341 06/04/21 0023  PROCALCITON  --  0.88  --   WBC 13.5* 14.3* 20.0*     Liver Function Tests: Recent Labs  Lab 06/03/21 0136  AST 51*  ALT 18  ALKPHOS 54  BILITOT 0.8  PROT 6.0*  ALBUMIN 3.0*    No results for input(s): LIPASE, AMYLASE in the last 168 hours. No results for input(s): AMMONIA in the last 168 hours.  ABG    Component Value Date/Time   PHART 7.380 02/18/2021 0114   PCO2ART 47.9 02/18/2021 0114   PO2ART 98 02/18/2021 0114   HCO3 26.3 06/03/2021 0159   TCO2 27  06/03/2021 0159   ACIDBASEDEF 0.2 05/03/2018 0015   O2SAT 99.0 06/03/2021 0159      Coagulation Profile: No results for input(s): INR, PROTIME in the last 168 hours.  Cardiac Enzymes: No results for input(s): CKTOTAL, CKMB, CKMBINDEX, TROPONINI in the last 168 hours.  HbA1C: No results found for: HGBA1C  CBG: No results for input(s): GLUCAP in the last 168 hours.  Review of Systems:   Chest pain, shortness of breath, otherwise negative.   Past Medical History:  She,  has a past medical history of Anemia, Anxiety, Asthma, Chronic back pain, Chronic neck pain, Complex regional pain syndrome, COPD (chronic obstructive pulmonary disease) ([place]), GERD (gastroesophageal reflux disease), Hematuria (09/07/2013), Hypertension, IBS (irritable bowel syndrome), Migraine, and Paresthesia of left arm.   Surgical History:   Past Surgical History:  Procedure Laterality Date   DILITATION & CURRETTAGE/HYSTROSCOPY WITH THERMACHOICE ABLATION N/A 08/17/2013   Procedure: DILATATION & CURETTAGE/HYSTEROSCOPY WITH THERMACHOICE ABLATION;  Surgeon: Jonnie Kind, MD;  Location: [facility];  Service: Gynecology;  Laterality: N/A;  18 ml D5W in & out; total therapy time= 9 minutes 33 seconds; temp= 87 degrees celcius   HERNIA REPAIR     ventral hernia   POLYPECTOMY N/A 08/17/2013   Procedure: REMOVAL ENDOMETRIAL POLYP;  Surgeon: Jonnie Kind, MD;  Location: [facility];  Service: Gynecology;  Laterality: N/A;   TUBAL LIGATION     ULNAR NERVE REPAIR Left    [facility] 2013     Social History:   reports that she has been smoking cigarettes. She has a 26.00 pack-year smoking history. She has never used smokeless tobacco. She reports that she does not drink alcohol and does not use drugs.   Family History:  Her family history includes Cancer in her maternal aunt, maternal grandmother, and sister; Diabetes in her brother and sister; Heart disease in her brother and sister; Hypertension in her sister; Other in her  mother.   Allergies Allergies  Allergen Reactions   Gabapentin Other (See Comments) and Hypertension    Elevated BP requiring doubling of current meds   Other Other (See Comments)    Unnamed IV meds for migraine, patient stated it made her feel like "something was crawling all over" her body     Home Medications  Prior to Admission medications   Medication Sig Start Date End Date Taking? Authorizing Provider  albuterol (PROVENTIL) (2.5 MG/3ML) 0.083% nebulizer solution Take 3 mLs (2.5 mg total) by nebulization every 6 (six) hours as needed for wheezing or shortness of breath. 03/18/20 06/03/21 Yes Antonieta Pert, MD  albuterol (VENTOLIN HFA) 108 (90 Base) MCG/ACT inhaler Inhale 2 puffs into the lungs every 4 (four) hours as needed for wheezing or shortness of breath.   Yes [provider]  baclofen (LIORESAL) 10 MG tablet Take 10 mg by mouth in the morning, at noon, and at bedtime. Morning, afternon, and   Yes [provider]  oxyCODONE (ROXICODONE) 15 MG immediate release tablet Take 15 mg by mouth 3 (three) times daily.   Yes [provider]  SYMBICORT 160-4.5 MCG/ACT inhaler Inhale 2 puffs into the lungs daily. 02/20/21 02/20/22 Yes Pokhrel, Corrie Mckusick, MD  ALPRAZolam Duanne Moron) 1 MG tablet Take 1 mg by mouth 3 (three) times daily as needed for anxiety.    [provider]  cyclobenzaprine (FLEXERIL) 5 MG tablet Take 5 mg by mouth 3 (three) times daily as needed for muscle spasms. Patient not taking: No sig reported 01/07/20   [provider]  DULoxetine (CYMBALTA) 60 MG capsule Take 1 capsule (60 mg total) by mouth daily. Patient taking differently: Take 60 mg by mouth in the morning and at bedtime. 07/29/14   Kirsteins, Luanna Salk, MD  gabapentin (NEURONTIN) 300 MG capsule Take 600 mg by mouth 3 (three) times daily. 05/14/16   [provider]  hydrochlorothiazide (HYDRODIURIL) 50 MG tablet Take 50 mg by mouth daily. 01/18/20   [provider]   Ipratropium-Albuterol (COMBIVENT RESPIMAT) 20-100 MCG/ACT AERS respimat Inhale 1 puff into the lungs every 6 (six) hours as needed for wheezing or shortness of breath. Patient not taking: Reported on 06/03/2021 03/18/20   Antonieta Pert, MD  lamoTRIgine (LAMICTAL) 25 MG tablet Take 25 mg by mouth daily.    [provider]  lisinopril (ZESTRIL) 20 MG tablet Take 20 mg by mouth daily. 01/07/20   [provider]  naloxone [facility]) nasal spray 4 mg/0.1 mL Place 1 spray into the nose as needed (as directed, for overdose).  [provider]  naproxen (NAPROSYN) 500 MG tablet Take 500 mg by mouth 2 (two) times daily with a meal. 02/28/18   [provider]  nicotine (NICODERM CQ - DOSED IN MG/24 HOURS) 14 mg/24hr patch Place 1 patch (14 mg total) onto the skin daily. 02/21/21   Pokhrel, Corrie Mckusick, MD  nicotine polacrilex (NICORETTE) 2 MG gum Take 1 each (2 mg total) by mouth as needed for smoking cessation. Patient not taking: Reported on 06/03/2021 02/20/21   Flora Lipps, MD  pantoprazole (PROTONIX) 40 MG tablet Take 1 tablet (40 mg total) by mouth 2 (two) times daily before a meal. Patient not taking: No sig reported 05/05/18   Cristal Ford, DO  predniSONE (DELTASONE) 10 MG tablet Take PO 4 tabs daily x 3 days,3 tabs daily x 3 days,2 tabs daily x 3 days,1 tab daily x 2 days then stop. 02/20/21   Pokhrel, Corrie Mckusick, MD  promethazine (PHENERGAN) 25 MG tablet Take 25 mg by mouth every 6 (six) hours as needed for nausea or vomiting.    [provider]  topiramate (TOPAMAX) 25 MG tablet Take 25 mg by mouth daily as needed (for more than 2 seizures a day). 11/11/19   [provider]     Critical care time:     I spent 45 minutes over half of which was face-to-face care with the patient but also included review of records, coordination of care.

## 2021-06-04 NOTE — Progress Notes (Addendum)
Pt requesting to know what is the plan of care for her because she could be at home doing her breathing tx, using O2, and taking her steroids. Pt very argumentative whenever staff attempts to explain anything to her. Pt refused labs to be drawn this am and told me that she didn't feel lab having it drawn at that time. Pt c/o chest tightness this am and SOB& request a breathing tx. RT came to do tx as ordered. Pt also complaining that she hasn't seen a doctor and wants to know the plan. Secure text sent to Dr. Wynelle Cleveland.

## 2021-06-04 NOTE — Progress Notes (Signed)
Nutrition Brief Note  Patient identified on the Malnutrition Screening Tool (MST) Report  Wt Readings from Last 15 Encounters:  06/03/21 99.4 kg  02/17/21 99.8 kg  03/15/20 99.8 kg  05/03/18 95.7 kg  03/05/18 90.7 kg  06/06/16 90.5 kg  04/23/16 93 kg  12/06/14 88 kg  10/28/14 86.2 kg  08/22/14 88.8 kg  07/29/14 90.4 kg  07/12/14 91.4 kg  06/27/14 86.2 kg  02/21/14 92.5 kg  10/25/13 95.2 kg   Joan [name] is a 55 y.o. 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  Reviewed I/O's: +480 ml x 24 hours and +680 ml since admission   Medications reviewed and include solu-medrol.   Labs reviewed: K: 3.4.    Current diet order is Heart Healthy, patient is consuming approximately n/a% of meals at this time. Labs and medications reviewed.   No nutrition interventions warranted at this time. If nutrition issues arise, please consult RD.   Loistine Chance, RD, LDN, [place] Registered Dietitian II Certified Diabetes Care and Education Specialist Please refer to [facility] for RD and/or RD on-call/weekend/after hours pager

## 2021-06-04 NOTE — Progress Notes (Signed)
PROGRESS NOTE    Joan [name]   RSW:[id number]  DOB: 08-Apr-1966  DOA: 06/03/2021 PCP: Larina Earthly, MD   Brief Narrative:  Joan [name] 55 year old female with hypertension, chronic back pain, GERD, depression and anxiety, IBS who presented to the hospital for shortness of breath.  The patient was recently admitted from 6/4 through 6/7 with similar complaints and treated for community-acquired pneumonia.  On prior admission pulse ox was 70 to 80% initially, she required CPAP on the way to the hospital followed by BiPAP in the ED.  She was given steroids and antibiotics, improved and was discharged in stable condition.  This time she complains of several days of shortness of breath and was noted to have a pulse ox of 70% when EMS arrived.  She was again placed on a CPAP by EMS and then a BiPAP in the ED.  Given Solu-Medrol, duo nebs and albuterol. Of note the patient has had bilateral interstitial infiltrates since 2017.  When evaluated by pulmonary in 7/21 a bronchoscopy and EBUS were recommended. She has had a rheumatological work-up in 2019 noted to be negative.   Subjective: States she had chest tightness today and a dry cough.   Assessment & Plan:   Principal Problem:   Acute on chronic respiratory failure with hypoxia (HCC)  Ongoing Tobacco abuse - has had multiple similar admissions and pulmonary evaluations (spanning over years) and has been recommend to have a bronchoscopy/ EBUS, BAL and biopsy- she previously did not want to wait in the hospital to have the procedure but also has not followed up as outpt - continues to have b/l parenchymal infiltrated on imaging - last CT performed in 2021 revealed widespread b/l airspace disease with slight sparing of the lung bases -started on BiPAP, Solu-Medrol, ceftriaxone, azithromycin, neb treatments and Incruse Ellipta - weaned to 5 L O2 today - She needs further work up for her chronic infiltrates  Active Problems:   Chronic  pain syndrome -Home meds include Cymbalta, baclofen, oxycodone 15 mg 3 times daily, Narcan as needed, Neurontin, Flexeril, naproxen - have resumed home meds-     Anxiety -Home meds include alprazolam, lamotrigine, topiramate - have resumed Alprazolam at half dose for now    HTN (hypertension) -HCTZ and lisinopril on hold - she is asking for her "water pill" - BP is near normal but I have resumed her HCTZ today  Nicotine abuse - have counseled her.     Time spent in minutes: 35 DVT prophylaxis: enoxaparin (LOVENOX) injection 40 mg Start: 06/03/21 1000 Code Status: Full code Family Communication:  Level of Care: Level of care: Progressive Disposition Plan:  Status is: Inpatient  Remains inpatient appropriate because:IV treatments appropriate due to intensity of illness or inability to take PO and Inpatient level of care appropriate due to severity of illness  Dispo: The patient is from: Home              Anticipated d/c is to: Home              Patient currently is not medically stable to d/c.   Difficult to place patient No  Consultants:  pulmonary Procedures:  none Antimicrobials:  Anti-infectives (From admission, onward)    Start     Dose/Rate Route Frequency Ordered Stop   06/03/21 0645  cefTRIAXone (ROCEPHIN) 2 g in sodium chloride 0.9 % 100 mL IVPB        2 g 200 mL/hr over 30 Minutes Intravenous Daily 06/03/21 0640  06/03/21 0645  azithromycin (ZITHROMAX) 500 mg in sodium chloride 0.9 % 250 mL IVPB        500 mg 250 mL/hr over 60 Minutes Intravenous Daily 06/03/21 0640          Objective: Vitals:   06/04/21 0400 06/04/21 0831 06/04/21 0904 06/04/21 1234  BP: 120/76 109/80  123/85  Pulse: 71   98  Resp: (!) 22   18  Temp: 98.7 F (37.1 C) 98.5 F (36.9 C)  98.7 F (37.1 C)  TempSrc: Oral Oral  Oral  SpO2: 97%  92% 92%  Weight:      Height:        Intake/Output Summary (Last 24 hours) at 06/04/2021 1358 Last data filed at 06/03/2021 2103 Gross  per 24 hour  Intake 30 ml  Output --  Net 30 ml    Filed Weights   06/03/21 0139 06/03/21 2343  Weight: 99.8 kg 99.4 kg    Examination: General exam: Appears comfortable  HEENT: PERRLA, oral mucosa moist, no sclera icterus or thrush Respiratory system: b/l rhonchi- on 4 L O2 with pulse ox in low 90s, RR in 20s Cardiovascular system: S1 & S2 heard, regular rate and rhythm Gastrointestinal system: Abdomen soft, non-tender, nondistended. Normal bowel sounds   Central nervous system: Alert and oriented. No focal neurological deficits. Extremities: No cyanosis, clubbing or edema Skin: No rashes or ulcers Psychiatry:  depressed     Data Reviewed: I have personally reviewed following labs and imaging studies  CBC: Recent Labs  Lab 06/03/21 0136 06/03/21 0159 06/03/21 0341 06/04/21 0023  WBC 13.5*  --  14.3* 20.0*  NEUTROABS 11.0*  --   --   --   HGB 12.4 12.9 13.5 13.3  HCT 38.2 38.0 41.2 41.3  MCV 85.8  --  85.5 87.9  PLT 256  --  252 017    Basic Metabolic Panel: Recent Labs  Lab 06/03/21 0136 06/03/21 0159 06/03/21 0341 06/04/21 0018  NA 137 139 137 136  K 3.2* 3.3* 3.4* 4.1  CL 103  --  103 103  CO2 24  --  21* 26  GLUCOSE 136*  --  204* 112*  BUN 9  --  9 11  CREATININE 0.66  --  0.69 0.66  CALCIUM 9.0  --  9.1 8.9    GFR: Estimated Creatinine Clearance: 96.7 mL/min (by C-G formula based on SCr of 0.66 mg/dL). Liver Function Tests: Recent Labs  Lab 06/03/21 0136  AST 51*  ALT 18  ALKPHOS 54  BILITOT 0.8  PROT 6.0*  ALBUMIN 3.0*    No results for input(s): LIPASE, AMYLASE in the last 168 hours. No results for input(s): AMMONIA in the last 168 hours. Coagulation Profile: No results for input(s): INR, PROTIME in the last 168 hours. Cardiac Enzymes: No results for input(s): CKTOTAL, CKMB, CKMBINDEX, TROPONINI in the last 168 hours. BNP (last 3 results) No results for input(s): PROBNP in the last 8760 hours. HbA1C: No results for input(s):  HGBA1C in the last 72 hours. CBG: No results for input(s): GLUCAP in the last 168 hours. Lipid Profile: No results for input(s): CHOL, HDL, LDLCALC, TRIG, CHOLHDL, LDLDIRECT in the last 72 hours. Thyroid Function Tests: No results for input(s): TSH, T4TOTAL, FREET4, T3FREE, THYROIDAB in the last 72 hours. Anemia Panel: No results for input(s): VITAMINB12, FOLATE, FERRITIN, TIBC, IRON, RETICCTPCT in the last 72 hours. Urine analysis:    Component Value Date/Time   COLORURINE YELLOW 06/05/2016 0051   APPEARANCEUR  CLOUDY (A) 06/05/2016 0051   LABSPEC 1.016 06/05/2016 0051   PHURINE 6.0 06/05/2016 0051   GLUCOSEU NEGATIVE 06/05/2016 0051   HGBUR NEGATIVE 06/05/2016 0051   BILIRUBINUR NEGATIVE 06/05/2016 0051   KETONESUR NEGATIVE 06/05/2016 0051   PROTEINUR NEGATIVE 06/05/2016 0051   UROBILINOGEN 1.0 07/31/2015 2028   NITRITE NEGATIVE 06/05/2016 0051   LEUKOCYTESUR TRACE (A) 06/05/2016 0051   Sepsis Labs: _0 (procalcitonin:4,lacticidven:4) ) Recent Results (from the past 240 hour(s))  Resp Panel by RT-PCR (Flu A&B, Covid) Nasopharyngeal Swab     Status: None   Collection Time: 06/03/21  1:34 AM   Specimen: Nasopharyngeal Swab; Nasopharyngeal(NP) swabs in vial transport medium  Result Value Ref Range Status   SARS Coronavirus 2 by RT PCR NEGATIVE NEGATIVE Final    Comment: (NOTE) SARS-CoV-2 target nucleic acids are NOT DETECTED.  The SARS-CoV-2 RNA is generally detectable in upper respiratory specimens during the acute phase of infection. The lowest concentration of SARS-CoV-2 viral copies this assay can detect is 138 copies/mL. A negative result does not preclude SARS-Cov-2 infection and should not be used as the sole basis for treatment or other patient management decisions. A negative result may occur with  improper specimen collection/handling, submission of specimen other than nasopharyngeal swab, presence of viral mutation(s) within the areas targeted by this  assay, and inadequate number of viral copies(<138 copies/mL). A negative result must be combined with clinical observations, patient history, and epidemiological information. The expected result is Negative.  Fact Sheet for Patients:  EntrepreneurPulse.com.au  Fact Sheet for Healthcare Providers:  IncredibleEmployment.be  This test is no t yet approved or cleared by the [place] FDA and  has been authorized for detection and/or diagnosis of SARS-CoV-2 by FDA under an Emergency Use Authorization (EUA). This EUA will remain  in effect (meaning this test can be used) for the duration of the COVID-19 declaration under Section 564(b)(1) of the Act, 21 U.S.C.section 360bbb-3(b)(1), unless the authorization is terminated  or revoked sooner.       Influenza A by PCR NEGATIVE NEGATIVE Final   Influenza B by PCR NEGATIVE NEGATIVE Final    Comment: (NOTE) The Xpert Xpress SARS-CoV-2/FLU/RSV plus assay is intended as an aid in the diagnosis of influenza from Nasopharyngeal swab specimens and should not be used as a sole basis for treatment. Nasal washings and aspirates are unacceptable for Xpert Xpress SARS-CoV-2/FLU/RSV testing.  Fact Sheet for Patients: EntrepreneurPulse.com.au  Fact Sheet for Healthcare Providers: IncredibleEmployment.be  This test is not yet approved or cleared by the [place] FDA and has been authorized for detection and/or diagnosis of SARS-CoV-2 by FDA under an Emergency Use Authorization (EUA). This EUA will remain in effect (meaning this test can be used) for the duration of the COVID-19 declaration under Section 564(b)(1) of the Act, 21 U.S.C. section 360bbb-3(b)(1), unless the authorization is terminated or revoked.  Performed at [facility], [place] [address] [place], [place] [address]          Radiology Studies: DG Chest Port 1 View  Result Date:  06/03/2021 CLINICAL DATA:  Chest pain and shortness of breath EXAM: PORTABLE CHEST 1 VIEW COMPARISON:  02/17/2021 FINDINGS: Normal heart size. Diffuse bilateral airspace disease throughout both lungs, more prominent on the left. No pleural effusions. No pneumothorax. Mediastinal contours appear intact. IMPRESSION: Bilateral pulmonary parenchymal infiltrates, similar to prior study. Electronically Signed   By: Lucienne Capers M.D.   On: 06/03/2021 01:50      Scheduled Meds:  baclofen  10  mg Oral TID   divalproex  500 mg Oral Daily   DULoxetine  60 mg Oral Daily   enoxaparin (LOVENOX) injection  40 mg Subcutaneous Q24H   gabapentin  600 mg Oral QID   gabapentin  300 mg Oral Once   hydrochlorothiazide  50 mg Oral Daily   lamoTRIgine  25 mg Oral BID   lisinopril  20 mg Oral Daily   methylPREDNISolone (SOLU-MEDROL) injection  80 mg Intravenous Q24H   mometasone-formoterol  2 puff Inhalation BID   naproxen  500 mg Oral BID WC   oxyCODONE  15 mg Oral QID   pantoprazole  40 mg Oral BID AC   umeclidinium bromide  1 puff Inhalation Daily   Continuous Infusions:  azithromycin 500 mg (06/04/21 4707)   cefTRIAXone (ROCEPHIN)  IV 2 g (06/04/21 0553)     LOS: 1 day      Debbe Odea, MD [facility] Pager: [email or web address] 06/04/2021, 1:58 PM

## 2021-06-05 DIAGNOSIS — J9601 Acute respiratory failure with hypoxia: Secondary | ICD-10-CM | POA: Diagnosis not present

## 2021-06-05 LAB — ANGIOTENSIN CONVERTING ENZYME: Angiotensin-Converting Enzyme: 16 U/L (ref 14–82)

## 2021-06-05 MED ORDER — SULFAMETHOXAZOLE-TRIMETHOPRIM 800-160 MG PO TABS
1.0000 | ORAL_TABLET | Freq: Every day | ORAL | Status: DC
  Administered 2021-06-05 – 2021-06-07 (×3): 1 via ORAL
  Filled 2021-06-05 (×4): qty 1

## 2021-06-05 MED ORDER — SENNOSIDES-DOCUSATE SODIUM 8.6-50 MG PO TABS
1.0000 | ORAL_TABLET | Freq: Two times a day (BID) | ORAL | Status: DC | PRN
  Administered 2021-06-05 – 2021-06-06 (×2): 1 via ORAL
  Filled 2021-06-05 (×2): qty 1

## 2021-06-05 NOTE — Progress Notes (Addendum)
PROGRESS NOTE    Joan [name]   NID:[id number]  DOB: Jan 29, 1966  DOA: 06/03/2021 PCP: Larina Earthly, MD   Brief Narrative:  Joan [name] 55 year old female with hypertension, chronic back pain, GERD, depression and anxiety, IBS who presented to the hospital for shortness of breath.  The patient was recently admitted from 6/4 through 6/7 with similar complaints and treated for community-acquired pneumonia.  On prior admission pulse ox was 70 to 80% initially, she required CPAP on the way to the hospital followed by BiPAP in the ED.  She was given steroids and antibiotics, improved and was discharged in stable condition.  This time she complains of several days of shortness of breath and was noted to have a pulse ox of 70% when EMS arrived.  She was again placed on a CPAP by EMS and then a BiPAP in the ED.  Given Solu-Medrol, duo nebs and albuterol. Of note the patient has had bilateral interstitial infiltrates since 2017.  When evaluated by pulmonary in 7/21 a bronchoscopy and EBUS were recommended. She has had a rheumatological work-up in 2019 noted to be negative.   Subjective: The patient is upset and thinks her medications have been changed. Has been told by her RN that Oxycodone is BID. I have discussed with the patient that I have not changed the dose since I saw her yesterday.  Has ongoing chest pressure unchanged from admission. She also admits to being constipated. We have discussed starting Senna. She states she was on Linzess int he past due to IBS but she stopped taking it and does not want it to be resumed.   Assessment & Plan:   Principal Problem:   Acute on chronic respiratory failure with hypoxia (HCC)  Ongoing Tobacco abuse - has had multiple similar admissions and pulmonary evaluations (spanning over years) and has been recommend to have a bronchoscopy/ EBUS, BAL and biopsy- she previously did not want to wait in the hospital to have the procedure but also has not  followed up as outpt - last CT performed in 2021 revealed widespread b/l airspace disease with slight sparing of the lung bases -started on BiPAP, Solu-Medrol, ceftriaxone, azithromycin, neb treatments and Incruse Ellipta - weaned to 5 L O2  - continue to wean oxygen as able - She needs further work up for her chronic infiltrates - CT scan reviewed - appreciate pulmonary eval> pulmonary does not feel it is safe to bronch her at this time and plans for an outpt bronch- unfortunately, she has never followed up as outpt in the past - pulm also starting Bactrim (need to follow QTc on this new med) - we will continue to manage in hospital until stable for discharge- she is not ambulating yet but will need to start today  Active Problems:   Chronic pain syndrome -Home meds include Cymbalta, Baclofen, oxycodone 15 mg 3 times daily, Narcan as needed, Neurontin, Flexeril, Naproxen - have resumed home meds per home doses  - she is on disability for chronic pain    Anxiety/depression, Migraines -Home meds include Alprazolam, Lamotrigine, Topiramate PRN & Depakote  IBS? Vs constipation secondary to chronic narcotics - constipated- declines to resume Linzess- start Senna and follow    HTN (hypertension) -HCTZ and lisinopril resumed  Nicotine abuse - have counseled her - cont Nicotine patch   Morbid obesity Body mass index is 35.37 kg/m.   Time spent in minutes: 35 DVT prophylaxis: enoxaparin (LOVENOX) injection 40 mg Start: 06/03/21 1000 Code Status: Full  code Family Communication:  Level of Care: Level of care: Progressive Disposition Plan:  Status is: Inpatient  Remains inpatient appropriate because:IV treatments appropriate due to intensity of illness or inability to take PO and Inpatient level of care appropriate due to severity of illness  Dispo: The patient is from: Home              Anticipated d/c is to: Home              Patient currently is not medically stable to d/c.    Difficult to place patient No  Consultants:  pulmonary Procedures:  none Antimicrobials:  Anti-infectives (From admission, onward)    Start     Dose/Rate Route Frequency Ordered Stop   06/05/21 1045  sulfamethoxazole-trimethoprim (BACTRIM DS) 800-160 MG per tablet 1 tablet        1 tablet Oral Daily 06/05/21 0946     06/03/21 0645  cefTRIAXone (ROCEPHIN) 2 g in sodium chloride 0.9 % 100 mL IVPB        2 g 200 mL/hr over 30 Minutes Intravenous Daily 06/03/21 0640     06/03/21 0645  azithromycin (ZITHROMAX) 500 mg in sodium chloride 0.9 % 250 mL IVPB        500 mg 250 mL/hr over 60 Minutes Intravenous Daily 06/03/21 0640          Objective: Vitals:   06/05/21 0010 06/05/21 0400 06/05/21 0815 06/05/21 0837  BP: 117/77 111/67 130/72   Pulse: 81 70 68   Resp: 18 (!) 21 18   Temp: 97.7 F (36.5 C) 97.7 F (36.5 C) 98.6 F (37 C)   TempSrc: Axillary Axillary Oral   SpO2: 92% 95% 95% 97%  Weight:      Height:        Intake/Output Summary (Last 24 hours) at 06/05/2021 1035 Last data filed at 06/04/2021 2036 Gross per 24 hour  Intake 720 ml  Output --  Net 720 ml    Filed Weights   06/03/21 0139 06/03/21 2343  Weight: 99.8 kg 99.4 kg    Examination: General exam: Appears comfortable  HEENT: PERRLA, oral mucosa moist, no sclera icterus or thrush Respiratory system: crackles at bases- on 4 L o2 - pulse ox in 90s Cardiovascular system: S1 & S2 heard, regular rate and rhythm Gastrointestinal system: Abdomen soft, non-tender, nondistended. Normal bowel sounds   Central nervous system: Alert and oriented. No focal neurological deficits. Extremities: No cyanosis, clubbing or edema Skin: No rashes or ulcers Psychiatry:  depressed    Data Reviewed: I have personally reviewed following labs and imaging studies  CBC: Recent Labs  Lab 06/03/21 0136 06/03/21 0159 06/03/21 0341 06/04/21 0023  WBC 13.5*  --  14.3* 20.0*  NEUTROABS 11.0*  --   --   --   HGB 12.4 12.9  13.5 13.3  HCT 38.2 38.0 41.2 41.3  MCV 85.8  --  85.5 87.9  PLT 256  --  252 026    Basic Metabolic Panel: Recent Labs  Lab 06/03/21 0136 06/03/21 0159 06/03/21 0341 06/04/21 0018  NA 137 139 137 136  K 3.2* 3.3* 3.4* 4.1  CL 103  --  103 103  CO2 24  --  21* 26  GLUCOSE 136*  --  204* 112*  BUN 9  --  9 11  CREATININE 0.66  --  0.69 0.66  CALCIUM 9.0  --  9.1 8.9    GFR: Estimated Creatinine Clearance: 96.7 mL/min (by C-G formula based  on SCr of 0.66 mg/dL). Liver Function Tests: Recent Labs  Lab 06/03/21 0136  AST 51*  ALT 18  ALKPHOS 54  BILITOT 0.8  PROT 6.0*  ALBUMIN 3.0*    No results for input(s): LIPASE, AMYLASE in the last 168 hours. No results for input(s): AMMONIA in the last 168 hours. Coagulation Profile: No results for input(s): INR, PROTIME in the last 168 hours. Cardiac Enzymes: No results for input(s): CKTOTAL, CKMB, CKMBINDEX, TROPONINI in the last 168 hours. BNP (last 3 results) No results for input(s): PROBNP in the last 8760 hours. HbA1C: No results for input(s): HGBA1C in the last 72 hours. CBG: No results for input(s): GLUCAP in the last 168 hours. Lipid Profile: No results for input(s): CHOL, HDL, LDLCALC, TRIG, CHOLHDL, LDLDIRECT in the last 72 hours. Thyroid Function Tests: No results for input(s): TSH, T4TOTAL, FREET4, T3FREE, THYROIDAB in the last 72 hours. Anemia Panel: No results for input(s): VITAMINB12, FOLATE, FERRITIN, TIBC, IRON, RETICCTPCT in the last 72 hours. Urine analysis:    Component Value Date/Time   COLORURINE YELLOW 06/05/2016 0051   APPEARANCEUR CLOUDY (A) 06/05/2016 0051   LABSPEC 1.016 06/05/2016 0051   PHURINE 6.0 06/05/2016 0051   GLUCOSEU NEGATIVE 06/05/2016 0051   HGBUR NEGATIVE 06/05/2016 0051   BILIRUBINUR NEGATIVE 06/05/2016 0051   KETONESUR NEGATIVE 06/05/2016 0051   PROTEINUR NEGATIVE 06/05/2016 0051   UROBILINOGEN 1.0 07/31/2015 2028   NITRITE NEGATIVE 06/05/2016 0051   LEUKOCYTESUR TRACE  (A) 06/05/2016 0051   Sepsis Labs: _0 (procalcitonin:4,lacticidven:4) ) Recent Results (from the past 240 hour(s))  Resp Panel by RT-PCR (Flu A&B, Covid) Nasopharyngeal Swab     Status: None   Collection Time: 06/03/21  1:34 AM   Specimen: Nasopharyngeal Swab; Nasopharyngeal(NP) swabs in vial transport medium  Result Value Ref Range Status   SARS Coronavirus 2 by RT PCR NEGATIVE NEGATIVE Final    Comment: (NOTE) SARS-CoV-2 target nucleic acids are NOT DETECTED.  The SARS-CoV-2 RNA is generally detectable in upper respiratory specimens during the acute phase of infection. The lowest concentration of SARS-CoV-2 viral copies this assay can detect is 138 copies/mL. A negative result does not preclude SARS-Cov-2 infection and should not be used as the sole basis for treatment or other patient management decisions. A negative result may occur with  improper specimen collection/handling, submission of specimen other than nasopharyngeal swab, presence of viral mutation(s) within the areas targeted by this assay, and inadequate number of viral copies(<138 copies/mL). A negative result must be combined with clinical observations, patient history, and epidemiological information. The expected result is Negative.  Fact Sheet for Patients:  EntrepreneurPulse.com.au  Fact Sheet for Healthcare Providers:  IncredibleEmployment.be  This test is no t yet approved or cleared by the [place] FDA and  has been authorized for detection and/or diagnosis of SARS-CoV-2 by FDA under an Emergency Use Authorization (EUA). This EUA will remain  in effect (meaning this test can be used) for the duration of the COVID-19 declaration under Section 564(b)(1) of the Act, 21 U.S.C.section 360bbb-3(b)(1), unless the authorization is terminated  or revoked sooner.       Influenza A by PCR NEGATIVE NEGATIVE Final   Influenza B by PCR NEGATIVE NEGATIVE Final     Comment: (NOTE) The Xpert Xpress SARS-CoV-2/FLU/RSV plus assay is intended as an aid in the diagnosis of influenza from Nasopharyngeal swab specimens and should not be used as a sole basis for treatment. Nasal washings and aspirates are unacceptable for Xpert Xpress SARS-CoV-2/FLU/RSV testing.  Fact Sheet  for Patients: EntrepreneurPulse.com.au  Fact Sheet for Healthcare Providers: IncredibleEmployment.be  This test is not yet approved or cleared by the [place] FDA and has been authorized for detection and/or diagnosis of SARS-CoV-2 by FDA under an Emergency Use Authorization (EUA). This EUA will remain in effect (meaning this test can be used) for the duration of the COVID-19 declaration under Section 564(b)(1) of the Act, 21 U.S.C. section 360bbb-3(b)(1), unless the authorization is terminated or revoked.  Performed at [facility], [place] [address] [place], [place] [address]          Radiology Studies: CT CHEST WO CONTRAST  Result Date: 06/05/2021 CLINICAL DATA:  55 year old female with history of unresolved pneumonia. Suspected DIP in active chronic smoker. EXAM: CT CHEST WITHOUT CONTRAST TECHNIQUE: Multidetector CT imaging of the chest was performed following the standard protocol without IV contrast. COMPARISON:  Chest CTA 03/16/2020. FINDINGS: Cardiovascular: Heart size is normal. There is no significant pericardial fluid, thickening or pericardial calcification. Aortic atherosclerosis. No definite coronary artery calcifications. Mediastinum/Nodes: Multiple prominent borderline enlarged and mildly enlarged mediastinal and bilateral hilar lymph nodes are noted, measuring up to 1.5 cm in short axis in the low right and left paratracheal nodal stations, similar to prior examinations, presumably chronic and reactive. Esophagus is unremarkable in appearance. No axillary lymphadenopathy. Lungs/Pleura: High-resolution imaging was not  performed. With these limitations in mind, there are again widespread areas of ground-glass attenuation scattered throughout the lungs bilaterally, most severe throughout the mid to upper lungs with relative sparing in some portions of the lung bases. Scattered areas of mild interlobular septal thickening are noted. No significant subpleural reticulation, traction bronchiectasis or honeycombing. Overall, findings appear less severe (less confluent consolidation and more ground-glass attenuation) than prior studies from 2021, and are even slightly less severe when compared to more remote prior examination from 2019, although the overall distribution and general spectrum of findings is very similar. Upper Abdomen: Unremarkable. Musculoskeletal: There are no aggressive appearing lytic or blastic lesions noted in the visualized portions of the skeleton. IMPRESSION: 1. The appearance of the lungs is unusual, but is suggestive of interstitial lung disease, as detailed above. DIP warrants consideration, however, the distribution would be unusual for DIP given the areas of relative basal sparing. Other differential considerations are broad, and include potential eosinophilic pneumonia or lymphoid interstitial pneumonia (LIP). Given the chronicity of these findings, consideration for open lung biopsy to establish a tissue diagnosis is recommended. Outpatient referral to Pulmonology for further clinical evaluation, and follow-up nonemergent high-resolution chest CT is also recommended. Electronically Signed   By: Vinnie Langton M.D.   On: 06/05/2021 08:31      Scheduled Meds:  baclofen  10 mg Oral QID   divalproex  500 mg Oral Daily   DULoxetine  60 mg Oral BID   enoxaparin (LOVENOX) injection  40 mg Subcutaneous Q24H   gabapentin  600 mg Oral QID   hydrochlorothiazide  50 mg Oral Daily   lamoTRIgine  25 mg Oral Daily   lisinopril  20 mg Oral Daily   methylPREDNISolone (SOLU-MEDROL) injection  80 mg Intravenous  Q24H   mometasone-formoterol  2 puff Inhalation BID   naproxen  500 mg Oral Daily   oxyCODONE  15 mg Oral QID   pantoprazole  40 mg Oral BID AC   [START ON 06/06/2021] predniSONE  30 mg Oral Q breakfast   sulfamethoxazole-trimethoprim  1 tablet Oral Daily   umeclidinium bromide  1 puff Inhalation Daily   Continuous Infusions:  azithromycin 500 mg (06/05/21 0545)   cefTRIAXone (ROCEPHIN)  IV 2 g (06/05/21 0508)     LOS: 2 days      Debbe Odea, MD [facility] Pager: [email or web address] 06/05/2021, 10:35 AM

## 2021-06-05 NOTE — Progress Notes (Signed)
Patient irritable this morning. She is upset that her oxycodone is only scheduled for every 12 hours. She states that "it is supposed to be every 6 hours." She exclaims loudly that "I am supposed to get my oxycodone with my gabapentin."  RN attempted to educate patient on medication administration ,but patient refused.

## 2021-06-05 NOTE — TOC Initial Note (Signed)
Transition of Care [facility]) - Initial/Assessment Note    Patient Details  Name: Joan [name] MRN: [id number] Date of Birth: [birth year]  Transition [facility]) CM/SW Contact:    Carles Collet, RN Phone Number: 06/05/2021, 1:45 PM  Clinical Narrative:               Damaris Schooner w patient at bedside with spouse. She was frustrated at being in the hospital. She wants to go home as soon as possible and has history of poor outpatient compliance and leaving AMA. Patient cites that she has a court date on Friday. CM offered to contact courts re court date, she declined and said that she would be leaving no matter what by then. She also states that she has an initial PCP appointment on 9/29 with Dr Jeanie Cooks that she will not reschedule, and will go to in person. She states that she wants her oxygen set up now. I feel that she has a very high risk for leaving AMA again, and spoke to Dr Wynelle Cleveland requesting ambulatory sats and workup for home oxygen so that it can be ordered and set up as soon as possible so this patient with chronic respiratory issues and poor compliance can be sent home with it wether she leaves AMA or stays. Dr Wynelle Cleveland in agreement, ambulatory sats ordered, and bedside nurse made aware   Expected Discharge Plan: [place] Barriers to Discharge: Continued Medical Work up   Patient Goals and CMS Choice Patient states their goals for this hospitalization and ongoing recovery are:: to go home as soon as possible      Expected Discharge Plan and Services Expected Discharge Plan: [place]   Discharge Planning Services: CM Consult   Living arrangements for the past 2 months: Apartment                                      Prior Living Arrangements/Services Living arrangements for the past 2 months: Apartment Lives with:: Spouse                   Activities of Daily Living Home Assistive Devices/Equipment: Environmental consultant (specify type), Cane  (specify quad or straight) ADL Screening (condition at time of admission) Patient's cognitive ability adequate to safely complete daily activities?: Yes Is the patient deaf or have difficulty hearing?: No Does the patient have difficulty seeing, even when wearing glasses/contacts?: No Does the patient have difficulty concentrating, remembering, or making decisions?: No Patient able to express need for assistance with ADLs?: Yes Does the patient have difficulty dressing or bathing?: No Independently performs ADLs?: Yes (appropriate for developmental age) Does the patient have difficulty walking or climbing stairs?: No Weakness of Legs: None Weakness of Arms/Hands: None  Permission Sought/Granted                  Emotional Assessment              Admission diagnosis:  Acute respiratory failure with hypoxia ([place]) [J96.01] Patient Active Problem List   Diagnosis Date Noted   Opiate dependence ([place]) 06/03/2021   ILD (interstitial lung disease) ([place]) 06/03/2021   Prolonged QT interval 03/16/2020   Asthma, chronic, unspecified asthma severity, with acute exacerbation 05/03/2018   Chest pain 05/03/2018   Tobacco abuse 05/03/2018   Pulmonary infiltrate 05/03/2018   Hypokalemia 03/05/2018   Depression 03/05/2018   Anxiety 03/05/2018  HTN (hypertension) 03/05/2018   SIRS (systemic inflammatory response syndrome) (HCC)    SOB (shortness of breath)    Chronic pain syndrome    CAP (community acquired pneumonia) 06/05/2016   Acute respiratory failure with hypoxia ([place]) 06/05/2016   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) 06/27/2014   Spinal stenosis in cervical region 06/27/2014   Back pain 09/07/2013   Hematuria 09/07/2013   Endometrium, polyp 08/17/2013   Anemia 08/13/2013   PCP:  Larina Earthly, MD Pharmacy:   [facility] [place], [place]  [address] Phone: [phone] Fax: [phone]     Social Determinants of Health (SDOH) Interventions    Readmission Risk Interventions No flowsheet data found.

## 2021-06-05 NOTE — Progress Notes (Signed)
SATURATION QUALIFICATIONS: (This note is used to comply with regulatory documentation for home oxygen)  Patient Saturations on Room Air at Rest = 88%  Patient Saturations on Room Air while Ambulating = 86%  Patient Saturations on 3 Liters of oxygen while Ambulating = 93%  Please briefly explain why patient needs home oxygen:Pt with with shortness of breath whiling ambulating and requiring oxygen due to saturation 86% on room air.

## 2021-06-05 NOTE — Progress Notes (Signed)
NAME:  Joan [name], MRN:  [id number], DOB:  1965/10/30, LOS: 2 ADMISSION DATE:  06/03/2021, CONSULTATION DATE:  06/03/21 REFERRING MD:  Alcario Drought, CHIEF COMPLAINT:  shortness of breath    History of Present Illness:  Ms. Luis is a 55 year old woman with hx of gerd, HTN, chronic back pain, here with recurrent shortness of breath.  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  Apparently has been hospitalized multiple times with acute resp insufficiency, treated with steroids and ABTX.  Work up has never been completed.  Several days of shortness of breath.   EMS called due to worsening tonight.   Initially sat 70% improved with CPAP, advanced to BIPAP on arrival to ED.  Also received duoneb, 2gm Mg, Solumedrol 125, dulera, albuterol  Also experiencing chest pain.   Interstitial infiltrates initially noted in 2017  Last seen by pulmonary team 03/2020 while experiencing similar acute respiratory failure. They rec Bronch and ebus.   Pertinent  Medical History  Gerd HTN Chronic pain Depression anxiety  Anemia Asthma Smoker Migraines   Meds: albuterol, baclofen, oxycodone, symbicort, alprazolam, flexeril, duloxetine, gabapentin, hydrochlorothiazide, naprosyn, lamictal, lisinopril, combivent, topamax, protonix, phenergan   Significant Hospital Events: Including procedures, antibiotic start and stop dates in addition to other pertinent events     Interim History / Subjective:  Shortness of breath is improved on oxygen  Objective   Blood pressure 130/72, pulse 68, temperature 98.6 F (37 C), temperature source Oral, resp. rate 18, height 5\' 6"  (1.676 m), weight 99.4 kg, SpO2 97 %.        Intake/Output Summary (Last 24 hours) at 06/05/2021 0947 Last data filed at 06/04/2021 2036 Gross per 24 hour  Intake 720 ml  Output --  Net 720 ml   Filed Weights   06/03/21 0139 06/03/21 2343  Weight: 99.8 kg 99.4 kg     Examination: General: comfortable on nasal cannula, no acute distress.  HENT: ncat  Lungs: Good air movement, bronchial breath sounds on expiration with Cardiovascular: rrr no mgr  Abdomen: nt, nd, nbs  Extremities: no edema no rash no LAD  Neuro: alert and oriented    Echo: 02/19/21  1. Left ventricular ejection fraction, by estimation, is 60 to 65%. The  left ventricle has normal function. The left ventricle has no regional  wall motion abnormalities. Left ventricular diastolic parameters were  normal.   2. Right ventricular systolic function is normal. The right ventricular  size is normal.   3. Left atrial size was mildly dilated.   4. In image 82, there is significant color flow near the intraatrial  septum, accentuated with respiration. This is likely venous inflow into  the RA, but as septum position not well seen, cannot exclude intra-atrial  shunting.   CT chest 03/16/20 IMPRESSION: 1. No evidence of central or segmental pulmonary emboli. 2. Widespread bilateral airspace disease. Differential diagnosis would include multifocal infection, pneumonitis, vasculitis, or less likely edema. Alveolar sarcoid could give this pattern, especially given the concomitant mediastinal lymphadenopathy. Overall, the pattern is similar to numerous prior CTs dating to 2019.  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  Active smoker.  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  Does not appear infectious to my eye especially given chronicity.  On the differential  sarcoidosis but is a bit of atypical pattern.  Eosinophilic pneumonia is possible but given the chronicity of imaging findings, would have to be chronic eosinophilic pneumonia which would expect circulating peripheral  eosinophilia which is not present.  Not consistent with UIP, NSIP.  No cystic lung disease to suggest cystic ILD.  Vasculitis possible but again unlikely given pattern of infiltrate.  Stressed the importance of smoking cessation given high suspicion for smoking-related ILD.    I addressed her questions and counseled on management of ILD including oxygen, bronchodilators, rehab and steroids/antibiotics for flares. Bronchoscopy +/- surgical tissue sampling may be needed for definitive diagnosis.  CT Chest 9/19 reviewed. GGO with upper to middle lobe predominance bilateral with some basilar sparking. No reticulation, traction bronchiectasis or honeycombing. Unable to obtain high resolution due to motion artifact  --Please provide nicotine patch during admission as well as on discharge --Counseled on smoking cessation including nicotine patch, lozenges and gum. --Solu-Medrol 80 mg IV to continue through tomorrow 9/20, this is 3 days, then transition to prednisone 30 mg daily --Recommend prednisone 30 mg daily for 1 month with repeat CT scan, if infiltrates resolve then work-up is complete, if infiltrates persist can pursue bronchoscopy at that time --Start PCP prophylaxis in setting of prolonged steroid use. Monitor QTC for prolongation. QTC 9/18 <573ms --Will arrange follow-up with Dr. Silas Flood  Discussed management with primary team with plan above. I have emphasized medical adherence and keeping follow-up with patient. Son at bedside also present. Encouraged patient/family to communicate with team any concerns/barriers so we can best assist patient for ongoing care.  Pulmonary will continue to follow  Labs   CBC: Recent Labs  Lab 06/03/21 0136 06/03/21 0159 06/03/21 0341 06/04/21 0023  WBC 13.5*  --  14.3* 20.0*  NEUTROABS 11.0*  --   --   --   HGB 12.4 12.9 13.5 13.3  HCT 38.2 38.0 41.2 41.3  MCV 85.8  --  85.5 87.9  PLT 256  --  252 315    Basic Metabolic Panel: Recent Labs  Lab  06/03/21 0136 06/03/21 0159 06/03/21 0341 06/04/21 0018  NA 137 139 137 136  K 3.2* 3.3* 3.4* 4.1  CL 103  --  103 103  CO2 24  --  21* 26  GLUCOSE 136*  --  204* 112*  BUN 9  --  9 11  CREATININE 0.66  --  0.69 0.66  CALCIUM 9.0  --  9.1 8.9   GFR: Estimated Creatinine Clearance: 96.7 mL/min (by C-G formula based on SCr of 0.66 mg/dL). Recent Labs  Lab 06/03/21 0136 06/03/21 0341 06/04/21 0023  PROCALCITON  --  0.88  --   WBC 13.5* 14.3* 20.0*    Liver Function Tests: Recent Labs  Lab 06/03/21 0136  AST 51*  ALT 18  ALKPHOS 54  BILITOT 0.8  PROT 6.0*  ALBUMIN 3.0*   No results for input(s): LIPASE, AMYLASE in the last 168 hours. No results for input(s): AMMONIA in the last 168 hours.  ABG    Component Value Date/Time   PHART 7.380 02/18/2021 0114   PCO2ART 47.9 02/18/2021 0114   PO2ART 98 02/18/2021 0114   HCO3 26.3 06/03/2021 0159   TCO2 27 06/03/2021 0159   ACIDBASEDEF 0.2 05/03/2018 0015   O2SAT 99.0 06/03/2021 0159     Coagulation Profile: No results for input(s): INR, PROTIME in the last 168 hours.  Cardiac Enzymes: No results for input(s): CKTOTAL, CKMB, CKMBINDEX, TROPONINI in the last 168 hours.  HbA1C: No results found for: HGBA1C  CBG: No results for input(s): GLUCAP in the last 168 hours.  Review of Systems:   Chest pain, shortness of breath, otherwise negative.   Past Medical History:  She,  has a past medical history of Anemia, Anxiety, Asthma, Chronic back pain, Chronic neck pain, Complex regional pain syndrome, COPD (chronic obstructive pulmonary disease) ([place]), GERD (gastroesophageal reflux disease), Hematuria (09/07/2013), Hypertension, IBS (irritable bowel syndrome), Migraine, and Paresthesia of left arm.   Surgical History:   Past Surgical History:  Procedure Laterality Date   DILITATION & CURRETTAGE/HYSTROSCOPY WITH THERMACHOICE ABLATION N/A 08/17/2013   Procedure: DILATATION & CURETTAGE/HYSTEROSCOPY WITH THERMACHOICE  ABLATION;  Surgeon: Jonnie Kind, MD;  Location: [facility];  Service: Gynecology;  Laterality: N/A;  18 ml D5W in & out; total therapy time= 9 minutes 33 seconds; temp= 87 degrees celcius   HERNIA REPAIR     ventral hernia   POLYPECTOMY N/A 08/17/2013   Procedure: REMOVAL ENDOMETRIAL POLYP;  Surgeon: Jonnie Kind, MD;  Location: [facility];  Service: Gynecology;  Laterality: N/A;   TUBAL LIGATION     ULNAR NERVE REPAIR Left    [facility] 2013     Social History:   reports that she has been smoking cigarettes. She has a 26.00 pack-year smoking history. She has never used smokeless tobacco. She reports that she does not drink alcohol and does not use drugs.   Family History:  Her family history includes Cancer in her maternal aunt, maternal grandmother, and sister; Diabetes in her brother and sister; Heart disease in her brother and sister; Hypertension in her sister; Other in her mother.   Allergies Allergies  Allergen Reactions   Gabapentin Other (See Comments) and Hypertension    Elevated BP requiring doubling of current meds   Other Other (See Comments)    Unnamed IV meds for migraine, patient stated it made her feel like "something was crawling all over" her body     Home Medications  Prior to Admission medications   Medication Sig Start Date End Date Taking? Authorizing Provider  albuterol (PROVENTIL) (2.5 MG/3ML) 0.083% nebulizer solution Take 3 mLs (2.5 mg total) by nebulization every 6 (six) hours as needed for wheezing or shortness of breath. 03/18/20 06/03/21 Yes Antonieta Pert, MD  albuterol (VENTOLIN HFA) 108 (90 Base) MCG/ACT inhaler Inhale 2 puffs into the lungs every 4 (four) hours as needed for wheezing or shortness of breath.   Yes [provider]  baclofen (LIORESAL) 10 MG tablet Take 10 mg by mouth in the morning, at noon, and at bedtime. Morning, afternon, and   Yes [provider]  oxyCODONE (ROXICODONE) 15 MG immediate release tablet Take 15 mg by mouth 3  (three) times daily.   Yes [provider]  SYMBICORT 160-4.5 MCG/ACT inhaler Inhale 2 puffs into the lungs daily. 02/20/21 02/20/22 Yes Pokhrel, Corrie Mckusick, MD  ALPRAZolam Duanne Moron) 1 MG tablet Take 1 mg by mouth 3 (three) times daily as needed for anxiety.    [provider]  cyclobenzaprine (FLEXERIL) 5 MG tablet Take 5 mg by mouth 3 (three) times daily as needed for muscle spasms. Patient not taking: No sig reported 01/07/20   [provider]  DULoxetine (CYMBALTA) 60 MG capsule Take 1 capsule (60 mg total) by mouth daily. Patient taking differently: Take 60 mg by mouth in the morning and at bedtime. 07/29/14   Kirsteins, Luanna Salk, MD  gabapentin (NEURONTIN) 300 MG capsule Take 600 mg by mouth 3 (three)  times daily. 05/14/16   [provider]  hydrochlorothiazide (HYDRODIURIL) 50 MG tablet Take 50 mg by mouth daily. 01/18/20   [provider]  Ipratropium-Albuterol (COMBIVENT RESPIMAT) 20-100 MCG/ACT AERS respimat Inhale 1 puff into the lungs every 6 (six) hours as needed for wheezing or shortness of breath. Patient not taking: Reported on 06/03/2021 03/18/20   Antonieta Pert, MD  lamoTRIgine (LAMICTAL) 25 MG tablet Take 25 mg by mouth daily.    [provider]  lisinopril (ZESTRIL) 20 MG tablet Take 20 mg by mouth daily. 01/07/20   [provider]  naloxone [facility]) nasal spray 4 mg/0.1 mL Place 1 spray into the nose as needed (as directed, for overdose).    [provider]  naproxen (NAPROSYN) 500 MG tablet Take 500 mg by mouth 2 (two) times daily with a meal. 02/28/18   [provider]  nicotine (NICODERM CQ - DOSED IN MG/24 HOURS) 14 mg/24hr patch Place 1 patch (14 mg total) onto the skin daily. 02/21/21   Pokhrel, Corrie Mckusick, MD  nicotine polacrilex (NICORETTE) 2 MG gum Take 1 each (2 mg total) by mouth as needed for smoking cessation. Patient not taking: Reported on 06/03/2021 02/20/21   Flora Lipps, MD  pantoprazole (PROTONIX) 40 MG  tablet Take 1 tablet (40 mg total) by mouth 2 (two) times daily before a meal. Patient not taking: No sig reported 05/05/18   Cristal Ford, DO  predniSONE (DELTASONE) 10 MG tablet Take PO 4 tabs daily x 3 days,3 tabs daily x 3 days,2 tabs daily x 3 days,1 tab daily x 2 days then stop. 02/20/21   Pokhrel, Corrie Mckusick, MD  promethazine (PHENERGAN) 25 MG tablet Take 25 mg by mouth every 6 (six) hours as needed for nausea or vomiting.    [provider]  topiramate (TOPAMAX) 25 MG tablet Take 25 mg by mouth daily as needed (for more than 2 seizures a day). 11/11/19   [provider]     Critical care time:     I have spent a total time of 34-minutes on the day of the appointment reviewing prior documentation, coordinating care and discussing medical diagnosis and plan with the patient/family. Past medical history, allergies, medications were reviewed. Pertinent imaging, labs and tests included in this note have been reviewed and interpreted independently by me.  Rodman Pickle, M.D. [facility] 06/05/2021 9:47 AM   See Amion for personal pager For hours between 7 PM to 7 AM, please call Elink for urgent questions

## 2021-06-06 ENCOUNTER — Other Ambulatory Visit: Payer: Self-pay

## 2021-06-06 ENCOUNTER — Telehealth: Payer: Self-pay

## 2021-06-06 DIAGNOSIS — J849 Interstitial pulmonary disease, unspecified: Secondary | ICD-10-CM

## 2021-06-06 DIAGNOSIS — J9601 Acute respiratory failure with hypoxia: Secondary | ICD-10-CM | POA: Diagnosis not present

## 2021-06-06 LAB — CBC
HCT: 36.2 % (ref 36.0–46.0)
Hemoglobin: 12.1 g/dL (ref 12.0–15.0)
MCH: 27.8 pg (ref 26.0–34.0)
MCHC: 33.4 g/dL (ref 30.0–36.0)
MCV: 83 fL (ref 80.0–100.0)
Platelets: 251 10*3/uL (ref 150–400)
RBC: 4.36 MIL/uL (ref 3.87–5.11)
RDW: 13.8 % (ref 11.5–15.5)
WBC: 11.6 10*3/uL — ABNORMAL HIGH (ref 4.0–10.5)
nRBC: 0 % (ref 0.0–0.2)

## 2021-06-06 LAB — RHEUMATOID FACTOR: Rheumatoid fact SerPl-aCnc: 14.2 IU/mL — ABNORMAL HIGH (ref <14.0)

## 2021-06-06 LAB — SJOGRENS SYNDROME-B EXTRACTABLE NUCLEAR ANTIBODY: SSB (La) (ENA) Antibody, IgG: <0.2 AI (ref 0.0–0.9)

## 2021-06-06 LAB — SJOGRENS SYNDROME-A EXTRACTABLE NUCLEAR ANTIBODY: SSA (Ro) (ENA) Antibody, IgG: <0.2 AI (ref 0.0–0.9)

## 2021-06-06 LAB — ANA W/REFLEX IF POSITIVE: Anti Nuclear Antibody (ANA): NEGATIVE

## 2021-06-06 NOTE — Care Management Important Message (Signed)
Important Message  Patient Details  Name: Joan [name] MRN: [id number] Date of Birth: 1966-05-09   Medicare Important Message Given:  Yes     Leon Montoya [name] 06/06/2021, 12:23 PM

## 2021-06-06 NOTE — Telephone Encounter (Signed)
-----   Message from L[place], MD sent at 06/06/2021  1:28 PM EDT ----- Regarding: Order CT Please order CT Chest without contrast during the week before her office visit on 07/05/21 with Dr. Silas Flood.

## 2021-06-06 NOTE — Progress Notes (Signed)
PROGRESS NOTE    Joan [name]   FTD:[id number]  DOB: 1965/11/14  DOA: 06/03/2021 PCP: Larina Earthly, MD   Brief Narrative:  Joan [name] 55 year old female with hypertension, chronic back pain, GERD, depression and anxiety, IBS who presented to the hospital for shortness of breath.  The patient was recently admitted from 6/4 through 6/7 with similar complaints and treated for community-acquired pneumonia.  On prior admission pulse ox was 70 to 80% initially, she required CPAP on the way to the hospital followed by BiPAP in the ED.  She was given steroids and antibiotics, improved and was discharged in stable condition.  This time she complains of several days of shortness of breath and was noted to have a pulse ox of 70% when EMS arrived.  She was again placed on a CPAP by EMS and then a BiPAP in the ED.  Given Solu-Medrol, duo nebs and albuterol. Of note the patient has had bilateral interstitial infiltrates since 2017.  When evaluated by pulmonary in 7/21 a bronchoscopy and EBUS were recommended. She has had a rheumatological work-up in 2019 noted to be negative.   Subjective: Has court on Friday per patient   Assessment & Plan:      Acute on chronic respiratory failure with hypoxia (HCC)  Ongoing Tobacco abuse - has had multiple similar admissions and pulmonary evaluations (spanning over years) and has been recommend to have a bronchoscopy/ EBUS, BAL and biopsy- she previously did not want to wait in the hospital to have the procedure but also has not followed up as outpt - last CT performed in 2021 revealed widespread b/l airspace disease with slight sparing of the lung bases -started on BiPAP, Solu-Medrol, ceftriaxone, azithromycin, neb treatments and Incruse Ellipta - weaned to 3 L O2  - continue to wean oxygen as able - She needs further work up for her chronic infiltrates - appreciate pulmonary eval> pulmonary does not feel it is safe to bronch her at this time and plans  for an outpt bronch- unfortunately, she has never followed up as outpt in the past - pulm also starting Bactrim -Qtc normal this AM -will arrange home O2 -will d/c on nicotine patch    Chronic pain syndrome -Home meds include Cymbalta, Baclofen, oxycodone 15 mg 3 times daily, Narcan as needed, Neurontin, Flexeril, Naproxen - have resumed home meds per home doses  - she is on disability for chronic pain    Anxiety/depression, Migraines -Home meds include Alprazolam, Lamotrigine, Topiramate PRN & Depakote  IBS? Vs constipation secondary to chronic narcotics - constipated- declines to resume Linzess- start Senna and follow    HTN (hypertension) -HCTZ and lisinopril resumed  Nicotine abuse - have counseled her - cont Nicotine patch   Morbid obesity Body mass index is 35.37 kg/m.   Time spent in minutes: 35 DVT prophylaxis: enoxaparin (LOVENOX) injection 40 mg Start: 06/03/21 1000 Code Status: Full code Family Communication: son on phone  Disposition Plan:  Status is: Inpatient  Remains inpatient appropriate because:IV treatments appropriate due to intensity of illness or inability to take PO and Inpatient level of care appropriate due to severity of illness  Dispo: The patient is from: Home              Anticipated d/c is to: Home              Patient currently is not medically stable to d/c.   Difficult to place patient No  Consultants:  pulmonary  Antimicrobials:  Anti-infectives (  From admission, onward)    Start     Dose/Rate Route Frequency Ordered Stop   06/05/21 1045  sulfamethoxazole-trimethoprim (BACTRIM DS) 800-160 MG per tablet 1 tablet        1 tablet Oral Daily 06/05/21 0946     06/03/21 0645  cefTRIAXone (ROCEPHIN) 2 g in sodium chloride 0.9 % 100 mL IVPB        2 g 200 mL/hr over 30 Minutes Intravenous Daily 06/03/21 0640     06/03/21 0645  azithromycin (ZITHROMAX) 500 mg in sodium chloride 0.9 % 250 mL IVPB        500 mg 250 mL/hr over 60 Minutes  Intravenous Daily 06/03/21 0640          Objective: Vitals:   06/05/21 2316 06/06/21 0400 06/06/21 0803 06/06/21 1138  BP: 125/62 118/73 127/75 (!) 139/99  Pulse: 64 60 (!) 58 63  Resp: _0 Temp: 98.7 F (37.1 C) 98.5 F (36.9 C) 98.5 F (36.9 C) 99.1 F (37.3 C)  TempSrc: Oral Oral Oral Oral  SpO2: 91% 91% 94% 94%  Weight:      Height:        Intake/Output Summary (Last 24 hours) at 06/06/2021 1217 Last data filed at 06/06/2021 0845 Gross per 24 hour  Intake 240 ml  Output --  Net 240 ml   Filed Weights   06/03/21 0139 06/03/21 2343  Weight: 99.8 kg 99.4 kg    Examination:  General: Appearance:    Obese female in no acute distress     Lungs:      respirations unlabored, diminished  Heart:    Normal heart rate. Normal rhythm. No murmurs, rubs, or gallops.    MS:   All extremities are intact.    Neurologic:   Awake, alert, oriented x 3. No apparent focal neurological           defect.        Data Reviewed: I have personally reviewed following labs and imaging studies  CBC: Recent Labs  Lab 06/03/21 0136 06/03/21 0159 06/03/21 0341 06/04/21 0023 06/06/21 0821  WBC 13.5*  --  14.3* 20.0* 11.6*  NEUTROABS 11.0*  --   --   --   --   HGB 12.4 12.9 13.5 13.3 12.1  HCT 38.2 38.0 41.2 41.3 36.2  MCV 85.8  --  85.5 87.9 83.0  PLT 256  --  252 273 397   Basic Metabolic Panel: Recent Labs  Lab 06/03/21 0136 06/03/21 0159 06/03/21 0341 06/04/21 0018  NA 137 139 137 136  K 3.2* 3.3* 3.4* 4.1  CL 103  --  103 103  CO2 24  --  21* 26  GLUCOSE 136*  --  204* 112*  BUN 9  --  9 11  CREATININE 0.66  --  0.69 0.66  CALCIUM 9.0  --  9.1 8.9   GFR: Estimated Creatinine Clearance: 96.7 mL/min (by C-G formula based on SCr of 0.66 mg/dL). Liver Function Tests: Recent Labs  Lab 06/03/21 0136  AST 51*  ALT 18  ALKPHOS 54  BILITOT 0.8  PROT 6.0*  ALBUMIN 3.0*   No results for input(s): LIPASE, AMYLASE in the last 168 hours. No results for  input(s): AMMONIA in the last 168 hours. Coagulation Profile: No results for input(s): INR, PROTIME in the last 168 hours. Cardiac Enzymes: No results for input(s): CKTOTAL, CKMB, CKMBINDEX, TROPONINI in the last 168 hours. BNP (last 3 results) No results for input(s):  PROBNP in the last 8760 hours. HbA1C: No results for input(s): HGBA1C in the last 72 hours. CBG: No results for input(s): GLUCAP in the last 168 hours. Lipid Profile: No results for input(s): CHOL, HDL, LDLCALC, TRIG, CHOLHDL, LDLDIRECT in the last 72 hours. Thyroid Function Tests: No results for input(s): TSH, T4TOTAL, FREET4, T3FREE, THYROIDAB in the last 72 hours. Anemia Panel: No results for input(s): VITAMINB12, FOLATE, FERRITIN, TIBC, IRON, RETICCTPCT in the last 72 hours. Urine analysis:    Component Value Date/Time   COLORURINE YELLOW 06/05/2016 0051   APPEARANCEUR CLOUDY (A) 06/05/2016 0051   LABSPEC 1.016 06/05/2016 0051   PHURINE 6.0 06/05/2016 0051   GLUCOSEU NEGATIVE 06/05/2016 0051   HGBUR NEGATIVE 06/05/2016 0051   BILIRUBINUR NEGATIVE 06/05/2016 0051   KETONESUR NEGATIVE 06/05/2016 0051   PROTEINUR NEGATIVE 06/05/2016 0051   UROBILINOGEN 1.0 07/31/2015 2028   NITRITE NEGATIVE 06/05/2016 0051   LEUKOCYTESUR TRACE (A) 06/05/2016 0051    Recent Results (from the past 240 hour(s))  Resp Panel by RT-PCR (Flu A&B, Covid) Nasopharyngeal Swab     Status: None   Collection Time: 06/03/21  1:34 AM   Specimen: Nasopharyngeal Swab; Nasopharyngeal(NP) swabs in vial transport medium  Result Value Ref Range Status   SARS Coronavirus 2 by RT PCR NEGATIVE NEGATIVE Final    Comment: (NOTE) SARS-CoV-2 target nucleic acids are NOT DETECTED.  The SARS-CoV-2 RNA is generally detectable in upper respiratory specimens during the acute phase of infection. The lowest concentration of SARS-CoV-2 viral copies this assay can detect is 138 copies/mL. A negative result does not preclude SARS-Cov-2 infection and should  not be used as the sole basis for treatment or other patient management decisions. A negative result may occur with  improper specimen collection/handling, submission of specimen other than nasopharyngeal swab, presence of viral mutation(s) within the areas targeted by this assay, and inadequate number of viral copies(<138 copies/mL). A negative result must be combined with clinical observations, patient history, and epidemiological information. The expected result is Negative.  Fact Sheet for Patients:  EntrepreneurPulse.com.au  Fact Sheet for Healthcare Providers:  IncredibleEmployment.be  This test is no t yet approved or cleared by the [place] FDA and  has been authorized for detection and/or diagnosis of SARS-CoV-2 by FDA under an Emergency Use Authorization (EUA). This EUA will remain  in effect (meaning this test can be used) for the duration of the COVID-19 declaration under Section 564(b)(1) of the Act, 21 U.S.C.section 360bbb-3(b)(1), unless the authorization is terminated  or revoked sooner.       Influenza A by PCR NEGATIVE NEGATIVE Final   Influenza B by PCR NEGATIVE NEGATIVE Final    Comment: (NOTE) The Xpert Xpress SARS-CoV-2/FLU/RSV plus assay is intended as an aid in the diagnosis of influenza from Nasopharyngeal swab specimens and should not be used as a sole basis for treatment. Nasal washings and aspirates are unacceptable for Xpert Xpress SARS-CoV-2/FLU/RSV testing.  Fact Sheet for Patients: EntrepreneurPulse.com.au  Fact Sheet for Healthcare Providers: IncredibleEmployment.be  This test is not yet approved or cleared by the [place] FDA and has been authorized for detection and/or diagnosis of SARS-CoV-2 by FDA under an Emergency Use Authorization (EUA). This EUA will remain in effect (meaning this test can be used) for the duration of the COVID-19 declaration under  Section 564(b)(1) of the Act, 21 U.S.C. section 360bbb-3(b)(1), unless the authorization is terminated or revoked.  Performed at [facility], [place] [address], [place], [place] [address]  Radiology Studies: CT CHEST WO CONTRAST  Result Date: 06/05/2021 CLINICAL DATA:  55 year old female with history of unresolved pneumonia. Suspected DIP in active chronic smoker. EXAM: CT CHEST WITHOUT CONTRAST TECHNIQUE: Multidetector CT imaging of the chest was performed following the standard protocol without IV contrast. COMPARISON:  Chest CTA 03/16/2020. FINDINGS: Cardiovascular: Heart size is normal. There is no significant pericardial fluid, thickening or pericardial calcification. Aortic atherosclerosis. No definite coronary artery calcifications. Mediastinum/Nodes: Multiple prominent borderline enlarged and mildly enlarged mediastinal and bilateral hilar lymph nodes are noted, measuring up to 1.5 cm in short axis in the low right and left paratracheal nodal stations, similar to prior examinations, presumably chronic and reactive. Esophagus is unremarkable in appearance. No axillary lymphadenopathy. Lungs/Pleura: High-resolution imaging was not performed. With these limitations in mind, there are again widespread areas of ground-glass attenuation scattered throughout the lungs bilaterally, most severe throughout the mid to upper lungs with relative sparing in some portions of the lung bases. Scattered areas of mild interlobular septal thickening are noted. No significant subpleural reticulation, traction bronchiectasis or honeycombing. Overall, findings appear less severe (less confluent consolidation and more ground-glass attenuation) than prior studies from 2021, and are even slightly less severe when compared to more remote prior examination from 2019, although the overall distribution and general spectrum of findings is very similar. Upper Abdomen: Unremarkable. Musculoskeletal: There are no  aggressive appearing lytic or blastic lesions noted in the visualized portions of the skeleton. IMPRESSION: 1. The appearance of the lungs is unusual, but is suggestive of interstitial lung disease, as detailed above. DIP warrants consideration, however, the distribution would be unusual for DIP given the areas of relative basal sparing. Other differential considerations are broad, and include potential eosinophilic pneumonia or lymphoid interstitial pneumonia (LIP). Given the chronicity of these findings, consideration for open lung biopsy to establish a tissue diagnosis is recommended. Outpatient referral to Pulmonology for further clinical evaluation, and follow-up nonemergent high-resolution chest CT is also recommended. Electronically Signed   By: Vinnie Langton M.D.   On: 06/05/2021 08:31      Scheduled Meds:  baclofen  10 mg Oral QID   divalproex  500 mg Oral Daily   DULoxetine  60 mg Oral BID   enoxaparin (LOVENOX) injection  40 mg Subcutaneous Q24H   gabapentin  600 mg Oral QID   hydrochlorothiazide  50 mg Oral Daily   lamoTRIgine  25 mg Oral Daily   lisinopril  20 mg Oral Daily   mometasone-formoterol  2 puff Inhalation BID   naproxen  500 mg Oral Daily   oxyCODONE  15 mg Oral QID   pantoprazole  40 mg Oral BID AC   predniSONE  30 mg Oral Q breakfast   sulfamethoxazole-trimethoprim  1 tablet Oral Daily   umeclidinium bromide  1 puff Inhalation Daily   Continuous Infusions:  azithromycin 500 mg (06/06/21 0532)   cefTRIAXone (ROCEPHIN)  IV 2 g (06/06/21 0433)     LOS: 3 days      Geradine Girt, DO [facility] [email or web address] 06/06/2021, 12:17 PM

## 2021-06-06 NOTE — Evaluation (Signed)
Physical Therapy Evaluation Patient Details Name: Joan [name] MRN: [id number] DOB: 1966/06/30 Today's Date: 06/06/2021  History of Present Illness  Pt is 55 yo female admitted with acute resp failure on 06/03/21.  She has hx including HTN, chronic back pain, GERD< depression, anxiety, and IBS, pt reports asthma.  Clinical Impression  Pt admitted with above diagnosis.  At baseline, she is independent and lives with family.  At evaluation, pt performed transfers and ambulation safely and steadily.  She ambulated 300' and pushed O2 tank herself.  She was on 3 L O2 with sats down to 85% with walking but quick recovery and asymptomatic.  Does not required skilled therapy.  Recommend ambulation with nursing staff and OOB frequently.        Recommendations for follow up therapy are one component of a multi-disciplinary discharge planning process, led by the attending physician.  Recommendations may be updated based on patient status, additional functional criteria and insurance authorization.  Follow Up Recommendations No PT follow up    Equipment Recommendations  Other (comment) (may need home O2)    Recommendations for Other Services       Precautions / Restrictions Precautions Precautions: None      Mobility  Bed Mobility Overal bed mobility: Independent                  Transfers Overall transfer level: Needs assistance Equipment used: None Transfers: Sit to/from Stand Sit to Stand: Supervision         General transfer comment: performed safely  Ambulation/Gait Ambulation/Gait assistance: Supervision Gait Distance (Feet): 300 Feet Assistive device: None Gait Pattern/deviations: WFL(Within Functional Limits) Gait velocity: normal   General Gait Details: Pt ambulated with normal gait pattern and managed O2 tank herself.  Steady gait  Stairs            Wheelchair Mobility    Modified Rankin (Stroke Patients Only)       Balance Overall balance  assessment: Independent   Sitting balance-Leahy Scale: Normal       Standing balance-Leahy Scale: Normal                               Pertinent Vitals/Pain Pain Assessment: No/denies pain    Home Living Family/patient expects to be discharged to:: Private residence Living Arrangements: Spouse/significant other;Children (husband and 2 sons) Available Help at Discharge: Family;Available 24 hours/day Type of Home: Apartment Home Access: Stairs to enter Entrance Stairs-Rails: Right;Left;Can reach both Entrance Stairs-Number of Steps: flight Home Layout: One level Home Equipment: None      Prior Function Level of Independence: Independent         Comments: completely independent with ADLs, IADLs, community ambulation, and driving     Hand Dominance        Extremity/Trunk Assessment   Upper Extremity Assessment Upper Extremity Assessment: Overall WFL for tasks assessed    Lower Extremity Assessment Lower Extremity Assessment: Overall WFL for tasks assessed    Cervical / Trunk Assessment Cervical / Trunk Assessment: Normal  Communication   Communication: No difficulties  Cognition Arousal/Alertness: Awake/alert Behavior During Therapy: WFL for tasks assessed/performed Overall Cognitive Status: Within Functional Limits for tasks assessed                                        General Comments General comments (skin integrity,  edema, etc.): Pt on 3 L O2 at rest with sats 92%.  Ambulated on 3 L dropped to 85% but pt asymptomatic, recovered quickly < 30 sec to 90% with rest.    Exercises     Assessment/Plan    PT Assessment Patent does not need any further PT services  PT Problem List         PT Treatment Interventions      PT Goals (Current goals can be found in the Care Plan section)  Acute Rehab PT Goals Patient Stated Goal: return home PT Goal Formulation: All assessment and education complete, DC therapy     Frequency     Barriers to discharge        Co-evaluation               AM-PAC PT "6 Clicks" Mobility  Outcome Measure Help needed turning from your back to your side while in a flat bed without using bedrails?: None Help needed moving from lying on your back to sitting on the side of a flat bed without using bedrails?: None Help needed moving to and from a bed to a chair (including a wheelchair)?: None Help needed standing up from a chair using your arms (e.g., wheelchair or bedside chair)?: None Help needed to walk in hospital room?: None Help needed climbing 3-5 steps with a railing? : A Little 6 Click Score: 23    End of Session Equipment Utilized During Treatment: Oxygen Activity Tolerance: Patient tolerated treatment well Patient left: with call bell/phone within reach;in bed;with bed alarm set Nurse Communication: Mobility status      Time: 8022-3361 PT Time Calculation (min) (ACUTE ONLY): 17 min   Charges:   PT Evaluation $PT Eval Low Complexity: 1 Low          Kaladin Noseworthy, PT Acute Rehab Services Pager [phone] [facility] [phone]   Karlton Lemon 06/06/2021, 10:56 AM

## 2021-06-06 NOTE — Telephone Encounter (Signed)
Order has been placed for CT chest without contrast per Dr. Loanne Drilling for the week before her office visit on 07/05/21 with Dr. Silas Flood. Nothing further needed at this time.

## 2021-06-06 NOTE — Progress Notes (Signed)
NAME:  Joan [name], MRN:  [id number], DOB:  12-22-1965, LOS: 3 ADMISSION DATE:  06/03/2021, CONSULTATION DATE:  06/03/21 REFERRING MD:  Alcario Drought, CHIEF COMPLAINT:  shortness of breath    History of Present Illness:  Joan [name] is a 55 year old woman with hx of gerd, HTN, chronic back pain, here with recurrent shortness of breath.  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  Apparently has been hospitalized multiple times with acute resp insufficiency, treated with steroids and ABTX.  Work up has never been completed.  Several days of shortness of breath.   EMS called due to worsening tonight.   Initially sat 70% improved with CPAP, advanced to BIPAP on arrival to ED.  Also received duoneb, 2gm Mg, Solumedrol 125, dulera, albuterol  Also experiencing chest pain.   Interstitial infiltrates initially noted in 2017  Last seen by pulmonary team 03/2020 while experiencing similar acute respiratory failure. They rec Bronch and ebus.   Pertinent  Medical History  Gerd HTN Chronic pain Depression anxiety  Anemia Asthma Smoker Migraines   Meds: albuterol, baclofen, oxycodone, symbicort, alprazolam, flexeril, duloxetine, gabapentin, hydrochlorothiazide, naprosyn, lamictal, lisinopril, combivent, topamax, protonix, phenergan   Significant Hospital Events: Including procedures, antibiotic start and stop dates in addition to other pertinent events     Interim History / Subjective:  Weaned to 3L O2. Improved dyspnea.   Objective   Blood pressure (!) 139/99, pulse 63, temperature 99.1 F (37.3 C), temperature source Oral, resp. rate 17, height 5\' 6"  (1.676 m), weight 99.4 kg, SpO2 94 %.        Intake/Output Summary (Last 24 hours) at 06/06/2021 1317 Last data filed at 06/06/2021 0845 Gross per 24 hour  Intake 240 ml  Output --  Net 240 ml   Filed Weights   06/03/21 0139 06/03/21 2343  Weight: 99.8 kg 99.4 kg   General:  Well-appearing, no acute distress HENT: [place], AT Eyes: EOMI, no scleral icterus Respiratory: Diminished breath sounds bilaterally. No wheezing Cardiovascular: RRR, -M/R/G, no JVD Extremities:-Edema,-tenderness Neuro: AAO x4, CNII-XII grossly intact Psych: Normal mood, normal affect   Echo: 02/19/21  1. Left ventricular ejection fraction, by estimation, is 60 to 65%. The  left ventricle has normal function. The left ventricle has no regional  wall motion abnormalities. Left ventricular diastolic parameters were  normal.   2. Right ventricular systolic function is normal. The right ventricular  size is normal.   3. Left atrial size was mildly dilated.   4. In image 82, there is significant color flow near the intraatrial  septum, accentuated with respiration. This is likely venous inflow into  the RA, but as septum position not well seen, cannot exclude intra-atrial  shunting.   CT chest 03/16/20 IMPRESSION: 1. No evidence of central or segmental pulmonary emboli. 2. Widespread bilateral airspace disease. Differential diagnosis would include multifocal infection, pneumonitis, vasculitis, or less likely edema. Alveolar sarcoid could give this pattern, especially given the concomitant mediastinal lymphadenopathy. Overall, the pattern is similar to numerous prior CTs dating to 2019.  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  Active smoker.  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  Does not appear infectious to my eye especially given chronicity.  On the differential sarcoidosis but is a bit of atypical pattern.  Eosinophilic pneumonia is possible but given the chronicity of imaging findings, would have to be chronic eosinophilic pneumonia which would expect circulating  peripheral eosinophilia which is not present.  Not consistent with UIP, NSIP.  No cystic lung disease to suggest cystic ILD.  Vasculitis possible but again unlikely given pattern of infiltrate.  Stressed the importance of smoking cessation given high suspicion for smoking-related ILD.    I addressed her questions and counseled on management of ILD including oxygen, bronchodilators, rehab and steroids/antibiotics for flares. Bronchoscopy +/- surgical tissue sampling may be needed for definitive diagnosis.  CT Chest 9/19 reviewed. GGO with upper to middle lobe predominance bilateral with some basilar sparking. No reticulation, traction bronchiectasis or honeycombing. Unable to obtain high resolution due to motion artifact  --Continue supplemental oxygen for goal SpO2 88-94% --Please provide nicotine patch during admission as well as on discharge --Counseled on smoking cessation including nicotine patch, lozenges and gum. --S/p solumedrol x 3 days. Started on prednisone 30 mg daily --Plan to continue prednisone 30 mg daily for 1 month followed by CT chest without contrast (Pulm will order). If infiltrates resolve then work-up is complete, if infiltrates persist can pursue bronchoscopy at that time --Continue PCP prophylaxis in setting of prolonged steroid use. Monitor QTC for prolongation. QTC 9/18 <580ms. Please arrange follow-up with PCP for BMET in one week to monitor electrolytes and EKG --Will arrange follow-up with Dr. Silas Flood  Discussed management with primary team with plan above. I have emphasized medical adherence and keeping follow-up with patient. Son at bedside also present. Encouraged patient/family to communicate with team any concerns/barriers so we can best assist patient for ongoing care.  Pulmonary available as needed  Labs   CBC: Recent Labs  Lab 06/03/21 0136 06/03/21 0159 06/03/21 0341 06/04/21 0023 06/06/21 0821  WBC 13.5*  --  14.3* 20.0* 11.6*  NEUTROABS 11.0*  --    --   --   --   HGB 12.4 12.9 13.5 13.3 12.1  HCT 38.2 38.0 41.2 41.3 36.2  MCV 85.8  --  85.5 87.9 83.0  PLT 256  --  252 273 622    Basic Metabolic Panel: Recent Labs  Lab 06/03/21 0136 06/03/21 0159 06/03/21 0341 06/04/21 0018  NA 137 139 137 136  K 3.2* 3.3* 3.4* 4.1  CL 103  --  103 103  CO2 24  --  21* 26  GLUCOSE 136*  --  204* 112*  BUN 9  --  9 11  CREATININE 0.66  --  0.69 0.66  CALCIUM 9.0  --  9.1 8.9   GFR: Estimated Creatinine Clearance: 96.7 mL/min (by C-G formula based on SCr of 0.66 mg/dL). Recent Labs  Lab 06/03/21 0136 06/03/21 0341 06/04/21 0023 06/06/21 0821  PROCALCITON  --  0.88  --   --   WBC 13.5* 14.3* 20.0* 11.6*    Liver Function Tests: Recent Labs  Lab 06/03/21 0136  AST 51*  ALT 18  ALKPHOS 54  BILITOT 0.8  PROT 6.0*  ALBUMIN 3.0*   No results for input(s): LIPASE, AMYLASE in the last 168 hours. No results for input(s): AMMONIA in the last 168 hours.  ABG    Component Value Date/Time   PHART 7.380 02/18/2021 0114   PCO2ART 47.9 02/18/2021 0114   PO2ART 98 02/18/2021 0114   HCO3 26.3 06/03/2021 0159   TCO2 27 06/03/2021 0159   ACIDBASEDEF 0.2 05/03/2018 0015   O2SAT 99.0 06/03/2021 0159     Coagulation Profile: No  results for input(s): INR, PROTIME in the last 168 hours.  Cardiac Enzymes: No results for input(s): CKTOTAL, CKMB, CKMBINDEX, TROPONINI in the last 168 hours.  HbA1C: No results found for: HGBA1C  CBG: No results for input(s): GLUCAP in the last 168 hours.  Critical care time:     Independent Care Time: 36 minutes  Rodman Pickle, M.D. [facility] Pulmonary/Critical Care Medicine 06/06/2021 1:18 PM   See Amion for personal pager For hours between 7 PM to 7 AM, please call Elink for urgent questions

## 2021-06-06 NOTE — TOC Progression Note (Addendum)
Transition of Care [facility]) - Progression Note    Patient Details  Name: Joan [name] MRN: [id number] Date of Birth: [birth year]  [facility] CM/SW Contact  Carles Collet, RN Phone Number: 06/06/2021, 11:54 AM  Clinical Narrative:    Home oxygen ordered through [employer]. Requested it to be delivered to room today.   MD has letter for court under letters. Followed back up with patient this afternoon and she states that her court date has been moved to Monday, and she declined my offer to send the letter to her lawyer.     Expected Discharge Plan: [place] Barriers to Discharge: Continued Medical Work up  Expected Discharge Plan and Services Expected Discharge Plan: [place]   Discharge Planning Services: CM Consult   Living arrangements for the past 2 months: Apartment                 DME Arranged: Oxygen DME Agency: [employer] Date DME Agency Contacted: 06/06/21 Time DME Agency Contacted: [phone] Representative spoke with at DME Agency: [place] ([place]) Interventions    Readmission Risk Interventions No flowsheet data found.

## 2021-06-07 LAB — CYCLIC CITRUL PEPTIDE ANTIBODY, IGG/IGA: CCP Antibodies IgG/IgA: <1 units (ref 0–19)

## 2021-06-07 MED ORDER — LISINOPRIL 20 MG PO TABS: 20.0000 mg | ORAL_TABLET | Freq: Every day | ORAL | 0 refills | Status: DC

## 2021-06-07 MED ORDER — HYDROCHLOROTHIAZIDE 50 MG PO TABS: 50.0000 mg | ORAL_TABLET | Freq: Every day | ORAL | 0 refills | Status: AC

## 2021-06-07 MED ORDER — LAMOTRIGINE 25 MG PO TABS: 25.0000 mg | ORAL_TABLET | Freq: Every day | ORAL | 0 refills | Status: DC

## 2021-06-07 MED ORDER — NICOTINE 14 MG/24HR TD PT24
14.0000 mg | MEDICATED_PATCH | Freq: Every day | TRANSDERMAL | 0 refills | Status: DC
Start: 2021-06-07 — End: 2021-10-31

## 2021-06-07 MED ORDER — DULOXETINE HCL 60 MG PO CPEP: 60.0000 mg | ORAL_CAPSULE | Freq: Two times a day (BID) | ORAL | Status: AC

## 2021-06-07 MED ORDER — SULFAMETHOXAZOLE-TRIMETHOPRIM 800-160 MG PO TABS: 1.0000 | ORAL_TABLET | Freq: Every day | ORAL | 0 refills | Status: DC

## 2021-06-07 MED ORDER — DIVALPROEX SODIUM 500 MG PO DR TAB: 500.0000 mg | DELAYED_RELEASE_TABLET | Freq: Every day | ORAL | 0 refills | Status: AC

## 2021-06-07 MED ORDER — PREDNISONE 10 MG PO TABS: 30.0000 mg | ORAL_TABLET | Freq: Every day | ORAL | 0 refills | Status: DC

## 2021-06-07 NOTE — Progress Notes (Signed)
Pt given discharge instructions, prescriptions, and care notes. Pt verbalized understanding AEB no further questions or concerns at this time. IV was discontinued, no redness, pain, or swelling noted at this time. Telemetry discontinued and Centralized Telemetry was notified. O2 for home taken w/ Pt. Money locked in security returned to Pt. Pt left the floor via wheelchair with staff in stable condition.

## 2021-06-07 NOTE — Care Management Important Message (Signed)
Important Message  Patient Details  Name: Joan [name] MRN: [id number] Date of Birth: 01-04-1966   Medicare Important Message Given:  Yes     [name] [name] 06/07/2021, 8:22 AM

## 2021-06-07 NOTE — Discharge Summary (Signed)
Physician Discharge Summary  Joan [name] IOM:[id number] DOB: 26-Nov-1965 DOA: 06/03/2021  PCP: Nolene Ebbs, MD  Admit date: 06/03/2021 Discharge date: 06/07/2021  Admitted From: home Discharge disposition: home   Recommendations for Outpatient Follow-Up:   Weekly BMP and EKG Home on O2-3L --Plan to continue prednisone 30 mg daily for 1 month followed by CT chest without contrast (Pulm will order). If infiltrates resolve then work-up is complete, if infiltrates persist can pursue bronchoscopy at that time --Continue PCP prophylaxis in setting of prolonged steroid use.    Discharge Diagnosis:   Principal Problem:   Acute respiratory failure with hypoxia (HCC) Active Problems:   Chronic pain syndrome   Anxiety   HTN (hypertension)   Pulmonary infiltrate   Opiate dependence ([place])   ILD (interstitial lung disease) ([place])    Discharge Condition: Improved.  Diet recommendation: Low sodium, heart healthy.  Wound care: None.  Code status: Full.   History of Present Illness:   Joan [name] is a 55 y.o. 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  The strong suspicion is that patient has undiagnosed (and incompletely treated) pulmonary sarcoidosis, put pt keeps leaving hospitals after feeling better with initial ABx + Steroids during stays before pulmonology can get a bronchoscopy to make formal diagnosis.  (See DC summary on 03/18/2020 as example).  Additionally pt doesn't follow up with pulm as outpt / pulm unable to get in touch with pt after hospital stays (see telephone note 03/27/20).   Pt presents to ED today with respiratory distress.  Pt with SOB over past several days.  Significantly worse today.  EMS called.  Pt satting in 46s on RA, increased WOB with shallow respirations.  Pt  started on CPAP, given duoneb, 2g mag, 114m solumedrol.  Resp status improved en-route.   Hospital Course by Problem:       Acute on chronic respiratory failure with hypoxia (HCC)  Ongoing Tobacco abuse - has had multiple similar admissions and pulmonary evaluations (spanning over years) and has been recommend to have a bronchoscopy/ EBUS, BAL and biopsy- she previously did not want to wait in the hospital to have the procedure but also has not followed up as outpt - last CT performed in 2021 revealed widespread b/l airspace disease with slight sparing of the lung bases -plan per pulm:  --Continue supplemental oxygen for goal SpO2 88-94% --Please provide nicotine patch during admission as well as on discharge --Counseled on smoking cessation including nicotine patch, lozenges and gum. --S/p solumedrol x 3 days. Started on prednisone 30 mg daily --Plan to continue prednisone 30 mg daily for 1 month followed by CT chest without contrast (Pulm will order). If infiltrates resolve then work-up is complete, if infiltrates persist can pursue bronchoscopy at that time --Continue PCP prophylaxis in setting of prolonged steroid use. Monitor QTC for prolongation. QTC 9/18 <5058m Please arrange follow-up with PCP for BMET in one week to monitor electrolytes and EKG --Will arrange follow-up with Dr. HuSilas Flood     Chronic pain syndrome -Home meds include Cymbalta, Baclofen, oxycodone 15 mg 3 times daily, Narcan as needed, Neurontin, Flexeril, Naproxen - have resumed home meds per home doses  - she is on disability for chronic pain     Anxiety/depression, Migraines -Home meds include Alprazolam, Lamotrigine, Topiramate PRN & Depakote  IBS? Vs constipation secondary to chronic narcotics - constipated- declines to resume Linzess- start Senna and follow     HTN (hypertension) -HCTZ and lisinopril resumed   Nicotine abuse - have counseled her - cont Nicotine patch    Morbid obesity Body mass  index is 35.37 kg/m.    Medical Consultants:    pulm  Discharge Exam:   Vitals:   06/07/21 0822 06/07/21 0913  BP: (!) 149/94 (!) 149/54  Pulse: 62 63  Resp: 16 15  Temp: 98.8 F (37.1 C)   SpO2:  94%   Vitals:   06/07/21 0341 06/07/21 0817 06/07/21 0822 06/07/21 0913  BP: 135/84  (!) 149/94 (!) 149/54  Pulse: 62  62 63  Resp: _0 Temp: 98 F (36.7 C) 98.8 F (37.1 C) 98.8 F (37.1 C)   TempSrc: Oral Oral Oral   SpO2: 95%   94%  Weight:      Height:        General exam: Appears calm and comfortable.   The results of significant diagnostics from this hospitalization (including imaging, microbiology, ancillary and laboratory) are listed below for reference.     Procedures and Diagnostic Studies:   CT CHEST WO CONTRAST  Result Date: 06/05/2021 CLINICAL DATA:  55 year old female with history of unresolved pneumonia. Suspected DIP in active chronic smoker. EXAM: CT CHEST WITHOUT CONTRAST TECHNIQUE: Multidetector CT imaging of the chest was performed following the standard protocol without IV contrast. COMPARISON:  Chest CTA 03/16/2020. FINDINGS: Cardiovascular: Heart size is normal. There is no significant pericardial fluid, thickening or pericardial calcification. Aortic atherosclerosis. No definite coronary artery calcifications. Mediastinum/Nodes: Multiple prominent borderline enlarged and mildly enlarged mediastinal and bilateral hilar lymph nodes are noted, measuring up to 1.5 cm in short axis in the low right and left paratracheal nodal stations, similar to prior examinations, presumably chronic and reactive. Esophagus is unremarkable in appearance. No axillary lymphadenopathy. Lungs/Pleura: High-resolution imaging was not performed. With these limitations in mind, there are again widespread areas of ground-glass attenuation scattered throughout the lungs bilaterally, most severe throughout the mid to upper lungs with relative sparing in some portions of the  lung bases. Scattered areas of mild interlobular septal thickening are noted. No significant subpleural reticulation, traction bronchiectasis or honeycombing. Overall, findings appear less severe (less confluent consolidation and more ground-glass attenuation) than prior studies from 2021, and are even slightly less severe when compared to more remote prior examination from 2019, although the overall distribution and general spectrum of findings is very similar. Upper Abdomen: Unremarkable. Musculoskeletal: There are no aggressive appearing lytic or blastic lesions noted in the visualized portions of the skeleton. IMPRESSION: 1. The appearance of the lungs is unusual, but is suggestive of interstitial lung disease, as detailed above. DIP warrants consideration, however, the distribution would be unusual for DIP given the areas of relative basal sparing. Other differential considerations are broad, and include potential eosinophilic pneumonia or lymphoid interstitial pneumonia (LIP). Given the chronicity of these findings, consideration for open lung biopsy to establish a tissue diagnosis is recommended. Outpatient referral to Pulmonology for further clinical evaluation, and follow-up nonemergent high-resolution chest CT is also recommended. Electronically Signed   By: Vinnie Langton M.D.   On: 06/05/2021 08:31   DG Chest Port 1 View  Result Date: 06/03/2021 CLINICAL DATA:  Chest pain and shortness of breath EXAM: PORTABLE CHEST 1 VIEW COMPARISON:  02/17/2021 FINDINGS: Normal heart size. Diffuse bilateral airspace disease throughout both lungs, more prominent on the  left. No pleural effusions. No pneumothorax. Mediastinal contours appear intact. IMPRESSION: Bilateral pulmonary parenchymal infiltrates, similar to prior study. Electronically Signed   By: Lucienne Capers M.D.   On: 06/03/2021 01:50     Labs:   Basic Metabolic Panel: Recent Labs  Lab 06/03/21 0136 06/03/21 0159 06/03/21 0341  06/04/21 0018  NA 137 139 137 136  K 3.2* 3.3* 3.4* 4.1  CL 103  --  103 103  CO2 24  --  21* 26  GLUCOSE 136*  --  204* 112*  BUN 9  --  9 11  CREATININE 0.66  --  0.69 0.66  CALCIUM 9.0  --  9.1 8.9   GFR Estimated Creatinine Clearance: 96.7 mL/min (by C-G formula based on SCr of 0.66 mg/dL). Liver Function Tests: Recent Labs  Lab 06/03/21 0136  AST 51*  ALT 18  ALKPHOS 54  BILITOT 0.8  PROT 6.0*  ALBUMIN 3.0*   No results for input(s): LIPASE, AMYLASE in the last 168 hours. No results for input(s): AMMONIA in the last 168 hours. Coagulation profile No results for input(s): INR, PROTIME in the last 168 hours.  CBC: Recent Labs  Lab 06/03/21 0136 06/03/21 0159 06/03/21 0341 06/04/21 0023 06/06/21 0821  WBC 13.5*  --  14.3* 20.0* 11.6*  NEUTROABS 11.0*  --   --   --   --   HGB 12.4 12.9 13.5 13.3 12.1  HCT 38.2 38.0 41.2 41.3 36.2  MCV 85.8  --  85.5 87.9 83.0  PLT 256  --  252 273 251   Cardiac Enzymes: No results for input(s): CKTOTAL, CKMB, CKMBINDEX, TROPONINI in the last 168 hours. BNP: Invalid input(s): POCBNP CBG: No results for input(s): GLUCAP in the last 168 hours. D-Dimer No results for input(s): DDIMER in the last 72 hours. Hgb A1c No results for input(s): HGBA1C in the last 72 hours. Lipid Profile No results for input(s): CHOL, HDL, LDLCALC, TRIG, CHOLHDL, LDLDIRECT in the last 72 hours. Thyroid function studies No results for input(s): TSH, T4TOTAL, T3FREE, THYROIDAB in the last 72 hours.  Invalid input(s): FREET3 Anemia work up No results for input(s): VITAMINB12, FOLATE, FERRITIN, TIBC, IRON, RETICCTPCT in the last 72 hours. Microbiology Recent Results (from the past 240 hour(s))  Resp Panel by RT-PCR (Flu A&B, Covid) Nasopharyngeal Swab     Status: None   Collection Time: 06/03/21  1:34 AM   Specimen: Nasopharyngeal Swab; Nasopharyngeal(NP) swabs in vial transport medium  Result Value Ref Range Status   SARS Coronavirus 2 by RT PCR  NEGATIVE NEGATIVE Final    Comment: (NOTE) SARS-CoV-2 target nucleic acids are NOT DETECTED.  The SARS-CoV-2 RNA is generally detectable in upper respiratory specimens during the acute phase of infection. The lowest concentration of SARS-CoV-2 viral copies this assay can detect is 138 copies/mL. A negative result does not preclude SARS-Cov-2 infection and should not be used as the sole basis for treatment or other patient management decisions. A negative result may occur with  improper specimen collection/handling, submission of specimen other than nasopharyngeal swab, presence of viral mutation(s) within the areas targeted by this assay, and inadequate number of viral copies(<138 copies/mL). A negative result must be combined with clinical observations, patient history, and epidemiological information. The expected result is Negative.  Fact Sheet for Patients:  EntrepreneurPulse.com.au  Fact Sheet for Healthcare Providers:  IncredibleEmployment.be  This test is no t yet approved or cleared by the [place] FDA and  has been authorized for detection and/or diagnosis of  SARS-CoV-2 by FDA under an Emergency Use Authorization (EUA). This EUA will remain  in effect (meaning this test can be used) for the duration of the COVID-19 declaration under Section 564(b)(1) of the Act, 21 U.S.C.section 360bbb-3(b)(1), unless the authorization is terminated  or revoked sooner.       Influenza A by PCR NEGATIVE NEGATIVE Final   Influenza B by PCR NEGATIVE NEGATIVE Final    Comment: (NOTE) The Xpert Xpress SARS-CoV-2/FLU/RSV plus assay is intended as an aid in the diagnosis of influenza from Nasopharyngeal swab specimens and should not be used as a sole basis for treatment. Nasal washings and aspirates are unacceptable for Xpert Xpress SARS-CoV-2/FLU/RSV testing.  Fact Sheet for Patients: EntrepreneurPulse.com.au  Fact Sheet for  Healthcare Providers: IncredibleEmployment.be  This test is not yet approved or cleared by the [place] FDA and has been authorized for detection and/or diagnosis of SARS-CoV-2 by FDA under an Emergency Use Authorization (EUA). This EUA will remain in effect (meaning this test can be used) for the duration of the COVID-19 declaration under Section 564(b)(1) of the Act, 21 U.S.C. section 360bbb-3(b)(1), unless the authorization is terminated or revoked.  Performed at [facility], [place] [address], [place], [place] [address]      Discharge Instructions:   Discharge Instructions     Diet - low sodium heart healthy   Complete by: As directed    Increase activity slowly   Complete by: As directed       Allergies as of 06/07/2021       Reactions   Gabapentin Other (See Comments), Hypertension   Elevated BP requiring doubling of current meds   Other Other (See Comments)   Unnamed IV meds for migraine, patient stated it made her feel like "something was crawling all over" her body        Medication List     STOP taking these medications    Combivent Respimat 20-100 MCG/ACT Aers respimat Generic drug: Ipratropium-Albuterol   naproxen sodium 220 MG tablet Commonly known as: ALEVE   nicotine polacrilex 2 MG gum Commonly known as: NICORETTE       TAKE these medications    albuterol 108 (90 Base) MCG/ACT inhaler Commonly known as: VENTOLIN HFA Inhale 2 puffs into the lungs every 4 (four) hours as needed for wheezing or shortness of breath.   albuterol (2.5 MG/3ML) 0.083% nebulizer solution Commonly known as: PROVENTIL Take 3 mLs (2.5 mg total) by nebulization every 6 (six) hours as needed for wheezing or shortness of breath.   ALPRAZolam 1 MG tablet Commonly known as: XANAX Take 1 mg by mouth 3 (three) times daily.   baclofen 10 MG tablet Commonly known as: LIORESAL Take 10 mg by mouth in the morning, at noon, and at bedtime.  Morning, afternon, and   cyclobenzaprine 5 MG tablet Commonly known as: FLEXERIL Take 5 mg by mouth 3 (three) times daily as needed for muscle spasms.   divalproex 500 MG DR tablet Commonly known as: DEPAKOTE Take 1 tablet (500 mg total) by mouth daily.   DULoxetine 60 MG capsule Commonly known as: CYMBALTA Take 1 capsule (60 mg total) by mouth in the morning and at bedtime.   gabapentin 300 MG capsule Commonly known as: NEURONTIN Take 600 mg by mouth 4 (four) times daily.   hydrochlorothiazide 50 MG tablet Commonly known as: HYDRODIURIL Take 1 tablet (50 mg total) by mouth daily.   lamoTRIgine 25 MG tablet Commonly known as: LAMICTAL Take 1 tablet (  25 mg total) by mouth daily.   lisinopril 20 MG tablet Commonly known as: ZESTRIL Take 1 tablet (20 mg total) by mouth daily.   naloxone 4 MG/0.1ML Liqd nasal spray kit Commonly known as: NARCAN Place 1 spray into the nose as needed (as directed, for overdose).   naproxen 500 MG tablet Commonly known as: NAPROSYN Take 500 mg by mouth 2 (two) times daily with a meal.   nicotine 14 mg/24hr patch Commonly known as: NICODERM CQ - dosed in mg/24 hours Place 1 patch (14 mg total) onto the skin daily.   oxyCODONE 15 MG immediate release tablet Commonly known as: ROXICODONE Take 15 mg by mouth 4 (four) times daily.   pantoprazole 40 MG tablet Commonly known as: PROTONIX Take 1 tablet (40 mg total) by mouth 2 (two) times daily before a meal.   predniSONE 10 MG tablet Commonly known as: DELTASONE Take 3 tablets (30 mg total) by mouth daily with breakfast. Start taking on: June 08, 2021 What changed:  how much to take how to take this when to take this additional instructions   promethazine 25 MG tablet Commonly known as: PHENERGAN Take 25 mg by mouth every 6 (six) hours as needed for nausea or vomiting.   QC Vitamin D3 125 MCG (5000 UT) Tabs Generic drug: Cholecalciferol Take 10,000 Units by mouth daily.    sulfamethoxazole-trimethoprim 800-160 MG tablet Commonly known as: BACTRIM DS Take 1 tablet by mouth daily. Start taking on: June 08, 2021   Symbicort 160-4.5 MCG/ACT inhaler Generic drug: budesonide-formoterol Inhale 2 puffs into the lungs daily.   topiramate 25 MG tablet Commonly known as: TOPAMAX Take 25 mg by mouth daily as needed (for more than 2 seizures a day).               Durable Medical Equipment  (From admission, onward)           Start     Ordered   06/06/21 1152  For home use only DME oxygen  Once       Question Answer Comment  Length of Need Lifetime   Mode or (Route) Nasal cannula   Liters per Minute 3   Frequency Continuous (stationary and portable oxygen unit needed)   Oxygen delivery system Gas      06/06/21 1151            Follow-up Information     Nolene Ebbs, MD Follow up.   Specialty: Internal Medicine Why: weekly BMP, weekly EKG Contact information: [address] [place] [address] [phone]                  Time coordinating discharge: 35 min  Signed:  Geradine Girt DO  [facility] 06/07/2021, 9:57 AM

## 2021-06-08 LAB — HYPERSENSITIVITY PNEUMONITIS
A. Pullulans Abs: NEGATIVE
A.Fumigatus #1 Abs: NEGATIVE
Micropolyspora faeni, IgG: NEGATIVE
Pigeon Serum Abs: NEGATIVE
Thermoact. Saccharii: NEGATIVE
Thermoactinomyces vulgaris, IgG: NEGATIVE

## 2021-06-14 ENCOUNTER — Other Ambulatory Visit: Payer: Self-pay | Admitting: Internal Medicine

## 2021-06-15 LAB — BASIC METABOLIC PANEL WITH GFR
BUN: 8 mg/dL (ref 7–25)
CO2: 27 mmol/L (ref 20–32)
Calcium: 10.1 mg/dL (ref 8.6–10.4)
Chloride: 104 mmol/L (ref 98–110)
Creat: 0.85 mg/dL (ref 0.50–1.03)
Glucose, Bld: 80 mg/dL (ref 65–99)
Potassium: 4.8 mmol/L (ref 3.5–5.3)
Sodium: 143 mmol/L (ref 135–146)
eGFR: 82 mL/min/{1.73_m2} (ref >=60)

## 2021-06-15 LAB — LIPID PANEL
Cholesterol: 209 mg/dL — ABNORMAL HIGH (ref <200)
HDL: 53 mg/dL (ref >=50)
LDL Cholesterol (Calc): 136 mg/dL (calc) — ABNORMAL HIGH
Non-HDL Cholesterol (Calc): 156 mg/dL (calc) — ABNORMAL HIGH (ref <130)
Total CHOL/HDL Ratio: 3.9 (calc) (ref <5.0)
Triglycerides: 101 mg/dL (ref <150)

## 2021-06-15 LAB — VITAMIN D 25 HYDROXY (VIT D DEFICIENCY, FRACTURES): Vit D, 25-Hydroxy: 12 ng/mL — ABNORMAL LOW (ref 30–100)

## 2021-06-15 LAB — EXTRA LAV TOP TUBE

## 2021-06-15 LAB — TSH: TSH: 0.61 mIU/L

## 2021-06-29 ENCOUNTER — Inpatient Hospital Stay: Admission: RE | Admit: 2021-06-29 | Payer: Medicare Other | Source: Ambulatory Visit

## 2021-07-05 ENCOUNTER — Institutional Professional Consult (permissible substitution): Payer: Medicare Other | Admitting: Pulmonary Disease

## 2021-10-27 ENCOUNTER — Inpatient Hospital Stay (HOSPITAL_COMMUNITY)
Admission: EM | Admit: 2021-10-27 | Discharge: 2021-10-31 | DRG: 167 | Disposition: A | Discharge status: Home / Self Care | Payer: Medicare Other | Attending: Internal Medicine | Admitting: Internal Medicine

## 2021-10-27 ENCOUNTER — Other Ambulatory Visit: Payer: Self-pay

## 2021-10-27 ENCOUNTER — Emergency Department (HOSPITAL_COMMUNITY): Payer: Medicare Other

## 2021-10-27 DIAGNOSIS — J440 Chronic obstructive pulmonary disease with acute lower respiratory infection: Secondary | ICD-10-CM | POA: Diagnosis present

## 2021-10-27 DIAGNOSIS — T380X5A Adverse effect of glucocorticoids and synthetic analogues, initial encounter: Secondary | ICD-10-CM | POA: Diagnosis not present

## 2021-10-27 DIAGNOSIS — F32A Depression, unspecified: Secondary | ICD-10-CM | POA: Diagnosis present

## 2021-10-27 DIAGNOSIS — Z7951 Long term (current) use of inhaled steroids: Secondary | ICD-10-CM

## 2021-10-27 DIAGNOSIS — F419 Anxiety disorder, unspecified: Secondary | ICD-10-CM | POA: Diagnosis present

## 2021-10-27 DIAGNOSIS — Z9889 Other specified postprocedural states: Secondary | ICD-10-CM

## 2021-10-27 DIAGNOSIS — D649 Anemia, unspecified: Secondary | ICD-10-CM | POA: Diagnosis present

## 2021-10-27 DIAGNOSIS — G894 Chronic pain syndrome: Secondary | ICD-10-CM | POA: Diagnosis present

## 2021-10-27 DIAGNOSIS — Z8249 Family history of ischemic heart disease and other diseases of the circulatory system: Secondary | ICD-10-CM | POA: Diagnosis not present

## 2021-10-27 DIAGNOSIS — N179 Acute kidney failure, unspecified: Secondary | ICD-10-CM | POA: Diagnosis present

## 2021-10-27 DIAGNOSIS — K219 Gastro-esophageal reflux disease without esophagitis: Secondary | ICD-10-CM | POA: Diagnosis present

## 2021-10-27 DIAGNOSIS — R0902 Hypoxemia: Secondary | ICD-10-CM

## 2021-10-27 DIAGNOSIS — R202 Paresthesia of skin: Secondary | ICD-10-CM | POA: Diagnosis present

## 2021-10-27 DIAGNOSIS — J811 Chronic pulmonary edema: Secondary | ICD-10-CM | POA: Diagnosis present

## 2021-10-27 DIAGNOSIS — Z6833 Body mass index (BMI) 33.0-33.9, adult: Secondary | ICD-10-CM | POA: Diagnosis not present

## 2021-10-27 DIAGNOSIS — F1120 Opioid dependence, uncomplicated: Secondary | ICD-10-CM | POA: Diagnosis present

## 2021-10-27 DIAGNOSIS — J45901 Unspecified asthma with (acute) exacerbation: Secondary | ICD-10-CM | POA: Diagnosis present

## 2021-10-27 DIAGNOSIS — E669 Obesity, unspecified: Secondary | ICD-10-CM | POA: Diagnosis present

## 2021-10-27 DIAGNOSIS — R7989 Other specified abnormal findings of blood chemistry: Secondary | ICD-10-CM

## 2021-10-27 DIAGNOSIS — D72829 Elevated white blood cell count, unspecified: Secondary | ICD-10-CM | POA: Diagnosis not present

## 2021-10-27 DIAGNOSIS — Z9981 Dependence on supplemental oxygen: Secondary | ICD-10-CM

## 2021-10-27 DIAGNOSIS — Z79899 Other long term (current) drug therapy: Secondary | ICD-10-CM

## 2021-10-27 DIAGNOSIS — J441 Chronic obstructive pulmonary disease with (acute) exacerbation: Principal | ICD-10-CM

## 2021-10-27 DIAGNOSIS — J849 Interstitial pulmonary disease, unspecified: Secondary | ICD-10-CM | POA: Diagnosis present

## 2021-10-27 DIAGNOSIS — Z833 Family history of diabetes mellitus: Secondary | ICD-10-CM

## 2021-10-27 DIAGNOSIS — Z720 Tobacco use: Secondary | ICD-10-CM | POA: Diagnosis present

## 2021-10-27 DIAGNOSIS — R778 Other specified abnormalities of plasma proteins: Secondary | ICD-10-CM

## 2021-10-27 DIAGNOSIS — J449 Chronic obstructive pulmonary disease, unspecified: Secondary | ICD-10-CM

## 2021-10-27 DIAGNOSIS — Z888 Allergy status to other drugs, medicaments and biological substances status: Secondary | ICD-10-CM

## 2021-10-27 DIAGNOSIS — I119 Hypertensive heart disease without heart failure: Secondary | ICD-10-CM | POA: Diagnosis present

## 2021-10-27 DIAGNOSIS — R0602 Shortness of breath: Secondary | ICD-10-CM

## 2021-10-27 DIAGNOSIS — Z9851 Tubal ligation status: Secondary | ICD-10-CM

## 2021-10-27 DIAGNOSIS — G43909 Migraine, unspecified, not intractable, without status migrainosus: Secondary | ICD-10-CM | POA: Diagnosis present

## 2021-10-27 DIAGNOSIS — R9389 Abnormal findings on diagnostic imaging of other specified body structures: Secondary | ICD-10-CM | POA: Diagnosis not present

## 2021-10-27 DIAGNOSIS — Z20822 Contact with and (suspected) exposure to covid-19: Secondary | ICD-10-CM | POA: Diagnosis present

## 2021-10-27 DIAGNOSIS — K589 Irritable bowel syndrome without diarrhea: Secondary | ICD-10-CM | POA: Diagnosis present

## 2021-10-27 DIAGNOSIS — R079 Chest pain, unspecified: Secondary | ICD-10-CM

## 2021-10-27 DIAGNOSIS — J9621 Acute and chronic respiratory failure with hypoxia: Principal | ICD-10-CM

## 2021-10-27 DIAGNOSIS — I10 Essential (primary) hypertension: Secondary | ICD-10-CM | POA: Diagnosis present

## 2021-10-27 DIAGNOSIS — F17210 Nicotine dependence, cigarettes, uncomplicated: Secondary | ICD-10-CM | POA: Diagnosis present

## 2021-10-27 DIAGNOSIS — R590 Localized enlarged lymph nodes: Secondary | ICD-10-CM | POA: Diagnosis present

## 2021-10-27 DIAGNOSIS — J45909 Unspecified asthma, uncomplicated: Secondary | ICD-10-CM | POA: Diagnosis present

## 2021-10-27 DIAGNOSIS — Q211 Atrial septal defect, unspecified: Secondary | ICD-10-CM | POA: Diagnosis not present

## 2021-10-27 LAB — CBC WITH DIFFERENTIAL/PLATELET
Abs Immature Granulocytes: 0.08 10*3/uL — ABNORMAL HIGH (ref 0.00–0.07)
Basophils Absolute: 0.1 10*3/uL (ref 0.0–0.1)
Basophils Relative: 1 %
Eosinophils Absolute: 0.4 10*3/uL (ref 0.0–0.5)
Eosinophils Relative: 4 %
HCT: 40.1 % (ref 36.0–46.0)
Hemoglobin: 12.6 g/dL (ref 12.0–15.0)
Immature Granulocytes: 1 %
Lymphocytes Relative: 22 %
Lymphs Abs: 2.6 10*3/uL (ref 0.7–4.0)
MCH: 26.9 pg (ref 26.0–34.0)
MCHC: 31.4 g/dL (ref 30.0–36.0)
MCV: 85.7 fL (ref 80.0–100.0)
Monocytes Absolute: 0.5 10*3/uL (ref 0.1–1.0)
Monocytes Relative: 4 %
Neutro Abs: 7.9 10*3/uL — ABNORMAL HIGH (ref 1.7–7.7)
Neutrophils Relative %: 68 %
Platelets: 277 10*3/uL (ref 150–400)
RBC: 4.68 MIL/uL (ref 3.87–5.11)
RDW: 15 % (ref 11.5–15.5)
WBC: 11.5 10*3/uL — ABNORMAL HIGH (ref 4.0–10.5)
nRBC: 0 % (ref 0.0–0.2)

## 2021-10-27 LAB — RESP PANEL BY RT-PCR (FLU A&B, COVID) ARPGX2
Influenza A by PCR: NEGATIVE
Influenza B by PCR: NEGATIVE
SARS Coronavirus 2 by RT PCR: NEGATIVE

## 2021-10-27 LAB — BASIC METABOLIC PANEL
Anion gap: 11 (ref 5–15)
BUN: 16 mg/dL (ref 6–20)
CO2: 24 mmol/L (ref 22–32)
Calcium: 9.1 mg/dL (ref 8.9–10.3)
Chloride: 102 mmol/L (ref 98–111)
Creatinine, Ser: 1.51 mg/dL — ABNORMAL HIGH (ref 0.44–1.00)
GFR, Estimated: 41 mL/min — ABNORMAL LOW (ref >60)
Glucose, Bld: 109 mg/dL — ABNORMAL HIGH (ref 70–99)
Potassium: 3.4 mmol/L — ABNORMAL LOW (ref 3.5–5.1)
Sodium: 137 mmol/L (ref 135–145)

## 2021-10-27 LAB — I-STAT ARTERIAL BLOOD GAS, ED
Acid-Base Excess: 3 mmol/L — ABNORMAL HIGH (ref 0.0–2.0)
Bicarbonate: 28.9 mmol/L — ABNORMAL HIGH (ref 20.0–28.0)
Calcium, Ion: 1.24 mmol/L (ref 1.15–1.40)
HCT: 38 % (ref 36.0–46.0)
Hemoglobin: 12.9 g/dL (ref 12.0–15.0)
O2 Saturation: 93 %
Patient temperature: 98.6
Potassium: 3.2 mmol/L — ABNORMAL LOW (ref 3.5–5.1)
Sodium: 138 mmol/L (ref 135–145)
TCO2: 30 mmol/L (ref 22–32)
pCO2 arterial: 51.1 mmHg — ABNORMAL HIGH (ref 32.0–48.0)
pH, Arterial: 7.36 (ref 7.350–7.450)
pO2, Arterial: 71 mmHg — ABNORMAL LOW (ref 83.0–108.0)

## 2021-10-27 LAB — TROPONIN I (HIGH SENSITIVITY)
Troponin I (High Sensitivity): 75 ng/L — ABNORMAL HIGH (ref <18)
Troponin I (High Sensitivity): 67 ng/L — ABNORMAL HIGH (ref <18)

## 2021-10-27 LAB — I-STAT VENOUS BLOOD GAS, ED
Acid-Base Excess: 1 mmol/L (ref 0.0–2.0)
Bicarbonate: 26.2 mmol/L (ref 20.0–28.0)
Calcium, Ion: 1.11 mmol/L — ABNORMAL LOW (ref 1.15–1.40)
HCT: 39 % (ref 36.0–46.0)
Hemoglobin: 13.3 g/dL (ref 12.0–15.0)
O2 Saturation: 90 %
Potassium: 3.8 mmol/L (ref 3.5–5.1)
Sodium: 139 mmol/L (ref 135–145)
TCO2: 28 mmol/L (ref 22–32)
pCO2, Ven: 44.1 mmHg (ref 44.0–60.0)
pH, Ven: 7.383 (ref 7.250–7.430)
pO2, Ven: 60 mmHg — ABNORMAL HIGH (ref 32.0–45.0)

## 2021-10-27 MED ORDER — POTASSIUM CHLORIDE CRYS ER 20 MEQ PO TBCR
40.0000 meq | EXTENDED_RELEASE_TABLET | Freq: Once | ORAL | Status: DC
  Filled 2021-10-27: qty 2

## 2021-10-27 MED ORDER — DOXYCYCLINE HYCLATE 100 MG PO TABS
100.0000 mg | ORAL_TABLET | Freq: Two times a day (BID) | ORAL | Status: DC
  Administered 2021-10-27 – 2021-10-31 (×8): 100 mg via ORAL
  Filled 2021-10-27 (×8): qty 1

## 2021-10-27 MED ORDER — IPRATROPIUM-ALBUTEROL 0.5-2.5 (3) MG/3ML IN SOLN: 3.0000 mL | Freq: Once | RESPIRATORY_TRACT | Status: DC

## 2021-10-27 MED ORDER — IOHEXOL 350 MG/ML SOLN
70.0000 mL | Freq: Once | INTRAVENOUS | Status: AC | PRN
  Administered 2021-10-27: 70 mL via INTRAVENOUS

## 2021-10-27 MED ORDER — BUDESONIDE 0.5 MG/2ML IN SUSP
2.0000 mg | Freq: Two times a day (BID) | RESPIRATORY_TRACT | Status: DC
  Administered 2021-10-27: 2 mg via RESPIRATORY_TRACT
  Administered 2021-10-28: 0.5 mg via RESPIRATORY_TRACT
  Filled 2021-10-27 (×3): qty 8

## 2021-10-27 MED ORDER — DIVALPROEX SODIUM 250 MG PO DR TAB
500.0000 mg | DELAYED_RELEASE_TABLET | Freq: Every day | ORAL | Status: DC
  Administered 2021-10-28 – 2021-10-31 (×4): 500 mg via ORAL
  Filled 2021-10-27 (×5): qty 2

## 2021-10-27 MED ORDER — METHYLPREDNISOLONE SODIUM SUCC 125 MG IJ SOLR
125.0000 mg | Freq: Once | INTRAMUSCULAR | Status: AC
  Administered 2021-10-27: 125 mg via INTRAVENOUS
  Filled 2021-10-27: qty 2

## 2021-10-27 MED ORDER — ACETAMINOPHEN 325 MG PO TABS
650.0000 mg | ORAL_TABLET | Freq: Four times a day (QID) | ORAL | Status: DC | PRN
  Administered 2021-10-29: 650 mg via ORAL
  Filled 2021-10-27: qty 2

## 2021-10-27 MED ORDER — LAMOTRIGINE 25 MG PO TABS
25.0000 mg | ORAL_TABLET | Freq: Every day | ORAL | Status: DC
  Administered 2021-10-29 – 2021-10-31 (×3): 25 mg via ORAL
  Filled 2021-10-27 (×3): qty 1

## 2021-10-27 MED ORDER — ALBUTEROL SULFATE (2.5 MG/3ML) 0.083% IN NEBU: 2.5000 mg | INHALATION_SOLUTION | Freq: Four times a day (QID) | RESPIRATORY_TRACT | Status: DC | PRN

## 2021-10-27 MED ORDER — PANTOPRAZOLE SODIUM 40 MG PO TBEC
40.0000 mg | DELAYED_RELEASE_TABLET | Freq: Two times a day (BID) | ORAL | Status: DC
  Administered 2021-10-27 – 2021-10-31 (×8): 40 mg via ORAL
  Filled 2021-10-27 (×8): qty 1

## 2021-10-27 MED ORDER — ENOXAPARIN SODIUM 40 MG/0.4ML IJ SOSY
40.0000 mg | PREFILLED_SYRINGE | INTRAMUSCULAR | Status: DC
  Administered 2021-10-27 – 2021-10-30 (×4): 40 mg via SUBCUTANEOUS
  Filled 2021-10-27 (×4): qty 0.4

## 2021-10-27 MED ORDER — IPRATROPIUM-ALBUTEROL 0.5-2.5 (3) MG/3ML IN SOLN
3.0000 mL | Freq: Four times a day (QID) | RESPIRATORY_TRACT | Status: DC
  Administered 2021-10-27 – 2021-10-28 (×4): 3 mL via RESPIRATORY_TRACT
  Filled 2021-10-27 (×5): qty 3

## 2021-10-27 MED ORDER — METHYLPREDNISOLONE SODIUM SUCC 40 MG IJ SOLR: 40.0000 mg | Freq: Three times a day (TID) | INTRAMUSCULAR | Status: DC

## 2021-10-27 MED ORDER — CYCLOBENZAPRINE HCL 10 MG PO TABS: 5.0000 mg | ORAL_TABLET | Freq: Three times a day (TID) | ORAL | Status: DC | PRN

## 2021-10-27 MED ORDER — BACLOFEN 10 MG PO TABS
10.0000 mg | ORAL_TABLET | Freq: Two times a day (BID) | ORAL | Status: DC
  Administered 2021-10-27 – 2021-10-29 (×4): 10 mg via ORAL
  Filled 2021-10-27 (×5): qty 1

## 2021-10-27 MED ORDER — SODIUM CHLORIDE 0.9 % IV BOLUS
1000.0000 mL | Freq: Once | INTRAVENOUS | Status: AC
  Administered 2021-10-27: 1000 mL via INTRAVENOUS

## 2021-10-27 MED ORDER — ACETAMINOPHEN 650 MG RE SUPP: 650.0000 mg | Freq: Four times a day (QID) | RECTAL | Status: DC | PRN

## 2021-10-27 MED ORDER — ALBUTEROL SULFATE (2.5 MG/3ML) 0.083% IN NEBU: 3.0000 mL | INHALATION_SOLUTION | RESPIRATORY_TRACT | Status: DC | PRN

## 2021-10-27 MED ORDER — GUAIFENESIN ER 600 MG PO TB12
1200.0000 mg | ORAL_TABLET | Freq: Two times a day (BID) | ORAL | Status: DC
  Administered 2021-10-27 – 2021-10-30 (×3): 1200 mg via ORAL
  Filled 2021-10-27 (×6): qty 2

## 2021-10-27 MED ORDER — GABAPENTIN 300 MG PO CAPS
600.0000 mg | ORAL_CAPSULE | Freq: Four times a day (QID) | ORAL | Status: DC
  Administered 2021-10-27 – 2021-10-31 (×14): 600 mg via ORAL
  Filled 2021-10-27 (×15): qty 2

## 2021-10-27 MED ORDER — OXYCODONE HCL 5 MG PO TABS
5.0000 mg | ORAL_TABLET | Freq: Four times a day (QID) | ORAL | Status: DC | PRN
  Administered 2021-10-27 – 2021-10-28 (×2): 5 mg via ORAL
  Filled 2021-10-27 (×2): qty 1

## 2021-10-27 MED ORDER — MOMETASONE FURO-FORMOTEROL FUM 200-5 MCG/ACT IN AERO
2.0000 | INHALATION_SPRAY | Freq: Two times a day (BID) | RESPIRATORY_TRACT | Status: DC
  Filled 2021-10-27: qty 8.8

## 2021-10-27 MED ORDER — PREDNISONE 20 MG PO TABS: 40.0000 mg | ORAL_TABLET | Freq: Every day | ORAL | Status: DC

## 2021-10-27 MED ORDER — ALPRAZOLAM 0.25 MG PO TABS
0.2500 mg | ORAL_TABLET | Freq: Three times a day (TID) | ORAL | Status: DC | PRN
  Administered 2021-10-27 – 2021-10-30 (×4): 0.25 mg via ORAL
  Filled 2021-10-27 (×4): qty 1

## 2021-10-27 MED ORDER — NICOTINE 14 MG/24HR TD PT24
14.0000 mg | MEDICATED_PATCH | Freq: Every day | TRANSDERMAL | Status: DC
  Filled 2021-10-27 (×4): qty 1

## 2021-10-27 MED ORDER — IPRATROPIUM-ALBUTEROL 0.5-2.5 (3) MG/3ML IN SOLN
3.0000 mL | Freq: Once | RESPIRATORY_TRACT | Status: AC
  Administered 2021-10-27: 3 mL via RESPIRATORY_TRACT
  Filled 2021-10-27: qty 3

## 2021-10-27 MED ORDER — DULOXETINE HCL 60 MG PO CPEP
60.0000 mg | ORAL_CAPSULE | Freq: Every day | ORAL | Status: DC
  Administered 2021-10-28 – 2021-10-29 (×2): 60 mg via ORAL
  Filled 2021-10-27 (×2): qty 1

## 2021-10-27 MED ORDER — HYDROCHLOROTHIAZIDE 25 MG PO TABS: 50.0000 mg | ORAL_TABLET | Freq: Every day | ORAL | Status: DC

## 2021-10-27 MED ORDER — HYDROCHLOROTHIAZIDE 25 MG PO TABS
50.0000 mg | ORAL_TABLET | Freq: Every day | ORAL | Status: DC
Start: 2021-10-28 — End: 2021-10-27

## 2021-10-27 MED ORDER — LISINOPRIL 20 MG PO TABS: 20.0000 mg | ORAL_TABLET | Freq: Every day | ORAL | Status: DC

## 2021-10-27 MED ORDER — HYDRALAZINE HCL 25 MG PO TABS: 25.0000 mg | ORAL_TABLET | Freq: Four times a day (QID) | ORAL | Status: DC | PRN

## 2021-10-27 MED ORDER — METHYLPREDNISOLONE SODIUM SUCC 40 MG IJ SOLR
40.0000 mg | Freq: Three times a day (TID) | INTRAMUSCULAR | Status: AC
  Administered 2021-10-27 – 2021-10-28 (×2): 40 mg via INTRAVENOUS
  Filled 2021-10-27 (×2): qty 1

## 2021-10-27 MED ORDER — MAGNESIUM SULFATE 2 GM/50ML IV SOLN
2.0000 g | Freq: Once | INTRAVENOUS | Status: AC
  Administered 2021-10-27: 2 g via INTRAVENOUS
  Filled 2021-10-27: qty 50

## 2021-10-27 NOTE — ED Notes (Signed)
This RN went in to give meds to pt - pt is very lethargic at this time - unable to answer many questions without falling asleep - holding PO meds at this time - MD paged

## 2021-10-27 NOTE — ED Triage Notes (Addendum)
Pt from home via EMS for eval of intermittent sob and cp x several days with associated cough. Hx COPD and has recently quit smoking cold [place]. SpO2 99% room air per EMS. Using home nebulizer tx without much improvement.

## 2021-10-27 NOTE — Progress Notes (Addendum)
Cross-coverage note:   Patient seen for increased lethargy. She was placed back on BiPAP and blood gas is being repeated. She wakes to loud voice or tactile stimuli, makes brief eye contact and answers one simple question before closing eyes again. Plan to increased level of care to progressive unit, continue BiPAP, follow-up on blood gas results.

## 2021-10-27 NOTE — ED Provider Notes (Signed)
[facility] EMERGENCY DEPARTMENT Provider Note   CSN: [id number] Arrival date & time: 10/27/21  1404     History  Chief Complaint  Patient presents with   Chest Pain   Shortness of Breath    Joan [name] is a 56 y.o. female.   Chest Pain Associated symptoms: shortness of breath   Shortness of Breath Associated symptoms: chest pain      Patient has history of chronic breathing issues.  She has a history of COPD as well as possible interstitial lung disease.  Patient states she has cut back on her smoking and only smoked a few cigarettes in the last few days.  Patient is chronically on home oxygen but she has been feeling more short of breath.  Patient has been using her nebulizer machines without much relief.  Also feels like she has to use her oxygen more than it is prescribed.  Patient denies any fevers or chills.  Patient states she has also been having intermittent chest discomfort. Home Medications Prior to Admission medications   Medication Sig Start Date End Date Taking? Authorizing Provider  albuterol (PROVENTIL) (2.5 MG/3ML) 0.083% nebulizer solution Take 3 mLs (2.5 mg total) by nebulization every 6 (six) hours as needed for wheezing or shortness of breath. 03/18/20 06/03/21  Antonieta Pert, MD  albuterol (VENTOLIN HFA) 108 (90 Base) MCG/ACT inhaler Inhale 2 puffs into the lungs every 4 (four) hours as needed for wheezing or shortness of breath.    [provider]  ALPRAZolam Duanne Moron) 1 MG tablet Take 1 mg by mouth 3 (three) times daily.    [provider]  baclofen (LIORESAL) 10 MG tablet Take 10 mg by mouth in the morning, at noon, and at bedtime. Morning, afternon, and    [provider]  Cholecalciferol (QC VITAMIN D3) 125 MCG (5000 UT) TABS Take 10,000 Units by mouth daily.    [provider]  cyclobenzaprine (FLEXERIL) 5 MG tablet Take 5 mg by mouth 3 (three) times daily as needed for muscle spasms. 01/07/20   [provider]  divalproex (DEPAKOTE) 500 MG DR tablet Take 1 tablet (500 mg total) by mouth daily. 06/07/21   Geradine Girt, DO  DULoxetine (CYMBALTA) 60 MG capsule Take 1 capsule (60 mg total) by mouth in the morning and at bedtime. 06/07/21   Geradine Girt, DO  gabapentin (NEURONTIN) 300 MG capsule Take 600 mg by mouth 4 (four) times daily. 05/14/16   [provider]  hydrochlorothiazide (HYDRODIURIL) 50 MG tablet Take 1 tablet (50 mg total) by mouth daily. 06/07/21   Geradine Girt, DO  lamoTRIgine (LAMICTAL) 25 MG tablet Take 1 tablet (25 mg total) by mouth daily. 06/07/21   Geradine Girt, DO  lisinopril (ZESTRIL) 20 MG tablet Take 1 tablet (20 mg total) by mouth daily. 06/07/21   Geradine Girt, DO  naloxone [facility]) nasal spray 4 mg/0.1 mL Place 1 spray into the nose as needed (as directed, for overdose).    [provider]  naproxen (NAPROSYN) 500 MG tablet Take 500 mg by mouth 2 (two) times daily with a meal. 02/28/18   [provider]  nicotine (NICODERM CQ - DOSED IN MG/24 HOURS) 14 mg/24hr patch Place 1 patch (14 mg total) onto the skin daily. 06/07/21   Geradine Girt, DO  oxyCODONE (ROXICODONE) 15 MG immediate release tablet Take 15 mg by mouth 4 (four) times daily.    [provider]  pantoprazole (Seltzer)  40 MG tablet Take 1 tablet (40 mg total) by mouth 2 (two) times daily before a meal. Patient not taking: Reported on 06/04/2021 05/05/18   Cristal Ford, DO  predniSONE (DELTASONE) 10 MG tablet Take 3 tablets (30 mg total) by mouth daily with breakfast. 06/08/21   Geradine Girt, DO  promethazine (PHENERGAN) 25 MG tablet Take 25 mg by mouth every 6 (six) hours as needed for nausea or vomiting.    [provider]  sulfamethoxazole-trimethoprim (BACTRIM DS) 800-160 MG tablet Take 1 tablet by mouth daily. 06/08/21   Geradine Girt, DO  SYMBICORT 160-4.5 MCG/ACT inhaler Inhale 2 puffs into the lungs daily. 02/20/21 02/20/22  Pokhrel, Corrie Mckusick,  MD  topiramate (TOPAMAX) 25 MG tablet Take 25 mg by mouth daily as needed (for more than 2 seizures a day). 11/11/19   [provider]      Allergies    Gabapentin and Other    Review of Systems   Review of Systems  Respiratory:  Positive for shortness of breath.   Cardiovascular:  Positive for chest pain.   Physical Exam Updated Vital Signs BP (!) 102/47    Pulse 77    Temp 99 F (37.2 C) (Oral)    Resp 13    Ht 1.676 m (5\' 6" )    Wt 94.8 kg    SpO2 92%    BMI 33.73 kg/m  Physical Exam Vitals and nursing note reviewed.  Constitutional:      Appearance: She is well-developed. She is not diaphoretic.  HENT:     Head: Normocephalic and atraumatic.     Right Ear: External ear normal.     Left Ear: External ear normal.  Eyes:     General: No scleral icterus.       Right eye: No discharge.        Left eye: No discharge.     Conjunctiva/sclera: Conjunctivae normal.  Neck:     Trachea: No tracheal deviation.  Cardiovascular:     Rate and Rhythm: Normal rate and regular rhythm.  Pulmonary:     Effort: Pulmonary effort is normal. No respiratory distress.     Breath sounds: No stridor. Wheezing present. No rales.  Abdominal:     General: Bowel sounds are normal. There is no distension.     Palpations: Abdomen is soft.     Tenderness: There is no abdominal tenderness. There is no guarding or rebound.  Musculoskeletal:        General: No tenderness or deformity.     Cervical back: Neck supple.  Skin:    General: Skin is warm and dry.     Findings: No rash.  Neurological:     General: No focal deficit present.     Mental Status: She is alert.     Cranial Nerves: No cranial nerve deficit (no facial droop, extraocular movements intact, no slurred speech).     Sensory: No sensory deficit.     Motor: No abnormal muscle tone or seizure activity.     Coordination: Coordination normal.  Psychiatric:        Mood and Affect: Mood normal.    ED Results / Procedures /  Treatments   Labs (all labs ordered are listed, but only abnormal results are displayed) Labs Reviewed  CBC WITH DIFFERENTIAL/PLATELET - Abnormal; Notable for the following components:      Result Value   WBC 11.5 (*)    Neutro Abs 7.9 (*)    Abs Immature Granulocytes  0.08 (*)    All other components within normal limits  BASIC METABOLIC PANEL - Abnormal; Notable for the following components:   Potassium 3.4 (*)    Glucose, Bld 109 (*)    Creatinine, Ser 1.51 (*)    GFR, Estimated 41 (*)    All other components within normal limits  I-STAT ARTERIAL BLOOD GAS, ED - Abnormal; Notable for the following components:   pCO2 arterial 51.1 (*)    pO2, Arterial 71 (*)    Bicarbonate 28.9 (*)    Acid-Base Excess 3.0 (*)    Potassium 3.2 (*)    All other components within normal limits  TROPONIN I (HIGH SENSITIVITY) - Abnormal; Notable for the following components:   Troponin I (High Sensitivity) 75 (*)    All other components within normal limits  RESP PANEL BY RT-PCR (FLU A&B, COVID) ARPGX2  TROPONIN I (HIGH SENSITIVITY)    EKG EKG Interpretation  Date/Time:  Saturday October 27 2021 14:19:08 EST Ventricular Rate:  85 PR Interval:  123 QRS Duration: 104 QT Interval:  361 QTC Calculation: 430 R Axis:   70 Text Interpretation: Sinus rhythm Borderline T abnormalities, diffuse leads Confirmed by Dorie Rank [phone]) on 10/27/2021 3:21:50 PM  Radiology DG Chest Portable 1 View  Result Date: 10/27/2021 CLINICAL DATA:  Pt from home via EMS for eval of intermittent sob and cp x several days with associated coughHx of asthma, COPD, GERD, HTNSmoker EXAM: PORTABLE CHEST 1 VIEW COMPARISON:  06/03/2021 and older studies. FINDINGS: Cardiac silhouette is normal in size. No mediastinal or hilar masses. There are prominent bronchovascular markings most evident in the bases, greater on the right. No lung consolidation. No evidence of edema. No pleural effusion or pneumothorax. Skeletal structures are  grossly intact. IMPRESSION: 1. Prominent bronchovascular markings in the lower lungs, right greater than left. This may all be chronic. A component of acute bronchial inflammation/infection should be considered in the proper clinical setting. Electronically Signed   By: Lajean Manes M.D.   On: 10/27/2021 15:21    Procedures Procedures    Medications Ordered in ED Medications  ipratropium-albuterol (DUONEB) 0.5-2.5 (3) MG/3ML nebulizer solution 3 mL (3 mLs Nebulization Given 10/27/21 1447)  methylPREDNISolone sodium succinate (SOLU-MEDROL) 125 mg/2 mL injection 125 mg (125 mg Intravenous Given 10/27/21 1446)    ED Course/ Medical Decision Making/ A&P Clinical Course as of 10/27/21 1607  Sat Oct 27, 2021  1428 Patient noted to have low oxygen saturation.  Bumped up to 5 L nasal cannula [JK]  1604 I-Stat arterial blood gas, ED  ([facility], [facility])(!) Decreased oxygen and elevated PCO2 [JK]  1604 CBC WITH DIFFERENTIAL(!) Normal hemoglobin [JK]  8119 Basic metabolic panel(!) Creatinine slightly increased at 1.51 [JK]    Clinical Course User Index [JK] Dorie Rank, MD                           Medical Decision Making Amount and/or Complexity of Data Reviewed Labs: ordered. Radiology: ordered.  Risk Prescription drug management.   Shortness of breath. Previous records reviewed from the patient's prior hospitalization.  Patient has history of some type of interstitial lung process.  She also has history of COPD.  Patient has significant wheezing on exam right now.  She does have increasing oxygen requirements.  I suspect this is related to COPD based on her wheezing.  At this time low suspicion for pulmonary embolism but will need to continue to monitor.  Chest pain  Patient does have slight increase in her troponin.  Concerning for possible acute coronary syndrome but with her hypoxia we will also proceed with CT angio to rule out PE  Care turned over to Dr Sabra Heck         Final Clinical  Impression(s) / ED Diagnoses Final diagnoses:  COPD exacerbation ([place])  Chest pain, unspecified type    Rx / DC Orders ED Discharge Orders     None         Dorie Rank, MD 10/27/21 1607

## 2021-10-27 NOTE — ED Notes (Signed)
MD Opyd at bedside. 

## 2021-10-27 NOTE — ED Notes (Signed)
Patient transported to CT 

## 2021-10-27 NOTE — Progress Notes (Addendum)
Patient appears to improving, wheezing less and breathing effort also improved. Patient reported that she has been developing wheezing, productive cough for 3 days and she has pick up smoking again since yesterday. Will order nicotine patch.  Less likely will need BIPAP, will downgrade patient to Tele. Repeat VBG at 2000.

## 2021-10-27 NOTE — ED Notes (Signed)
RT placed pt on bipap at this time

## 2021-10-27 NOTE — H&P (Signed)
History and Physical    Joan [name] HUD:[id number] DOB: 10-07-1965 DOA: 10/27/2021  PCP: Nolene Ebbs, MD (Confirm with patient/family/NH records and if not entered, this has to be entered at [facility] point of entry) Patient coming from: Home  I have personally briefly reviewed patient's old medical records in [place]  Chief Complaint: Wheezing, SOB  HPI: Joan [name] is a 56 y.o. female with medical history significant of Asthma, ILD with question of undiagnosed sarcoidosis in the past, chronic hypoxic respite failure on 2 L as needed at home, HTN, GERD, chronic pain syndrome, chronic narcotic dependence, anxiety/depression, cigarette smoking, presented with increasing shortness of breath and wheezing.  Patient quit smoking recently and started to develop wheezing and productive cough for about 1 week gradually getting worse.  She started to use home oxygen around-the-clock breathing treatment with little help, could not sleep at all last night because of shortness of breath and wheezing.  Denies any chest pain no fever chills.  ED Course: ED found patient O2 sats in the 70s, stabilized on 6 L.  CT angiogram negative for PE, continue to demonstrate chronic changes suspicious for ILD/sarcoidosis.  Patient was given breathing treatment, IV Solu-Medrol 125 mg x 1.  Review of Systems: As per HPI otherwise 14 point review of systems negative.    Past Medical History:  Diagnosis Date   Anemia    Anxiety    Asthma    Chronic back pain    Chronic neck pain    Complex regional pain syndrome    COPD (chronic obstructive pulmonary disease) (HCC)    GERD (gastroesophageal reflux disease)    Hematuria 09/07/2013   Hypertension    IBS (irritable bowel syndrome)    Migraine    Paresthesia of left arm     Past Surgical History:  Procedure Laterality Date   DILITATION & CURRETTAGE/HYSTROSCOPY WITH THERMACHOICE ABLATION N/A 08/17/2013   Procedure: DILATATION &  CURETTAGE/HYSTEROSCOPY WITH THERMACHOICE ABLATION;  Surgeon: Jonnie Kind, MD;  Location: [facility];  Service: Gynecology;  Laterality: N/A;  18 ml D5W in & out; total therapy time= 9 minutes 33 seconds; temp= 87 degrees celcius   HERNIA REPAIR     ventral hernia   POLYPECTOMY N/A 08/17/2013   Procedure: REMOVAL ENDOMETRIAL POLYP;  Surgeon: Jonnie Kind, MD;  Location: [facility];  Service: Gynecology;  Laterality: N/A;   TUBAL LIGATION     ULNAR NERVE REPAIR Left    [facility] 2013     reports that she has been smoking cigarettes. She has a 26.00 pack-year smoking history. She has never used smokeless tobacco. She reports that she does not drink alcohol and does not use drugs.  Allergies  Allergen Reactions   Gabapentin Other (See Comments) and Hypertension    Elevated BP requiring doubling of current meds   Other Other (See Comments)    Unnamed IV meds for migraine, patient stated it made her feel like "something was crawling all over" her body    Family History  Problem Relation Age of Onset   Diabetes Sister    Hypertension Sister    Heart disease Sister    Cancer Sister        brain tumor   Heart disease Brother    Diabetes Brother    Cancer Maternal Aunt        uterine   Cancer Maternal Grandmother        uterine   Other Mother  car accident     Prior to Admission medications   Medication Sig Start Date End Date Taking? Authorizing Provider  albuterol (PROVENTIL) (2.5 MG/3ML) 0.083% nebulizer solution Take 3 mLs (2.5 mg total) by nebulization every 6 (six) hours as needed for wheezing or shortness of breath. 03/18/20 06/03/21  Antonieta Pert, MD  albuterol (VENTOLIN HFA) 108 (90 Base) MCG/ACT inhaler Inhale 2 puffs into the lungs every 4 (four) hours as needed for wheezing or shortness of breath.    [provider]  ALPRAZolam Duanne Moron) 1 MG tablet Take 1 mg by mouth 3 (three) times daily.    [provider]  baclofen (LIORESAL) 10 MG tablet Take 10 mg by mouth  in the morning, at noon, and at bedtime. Morning, afternon, and    [provider]  Cholecalciferol (QC VITAMIN D3) 125 MCG (5000 UT) TABS Take 10,000 Units by mouth daily.    [provider]  cyclobenzaprine (FLEXERIL) 5 MG tablet Take 5 mg by mouth 3 (three) times daily as needed for muscle spasms. 01/07/20   [provider]  divalproex (DEPAKOTE) 500 MG DR tablet Take 1 tablet (500 mg total) by mouth daily. 06/07/21   Geradine Girt, DO  DULoxetine (CYMBALTA) 60 MG capsule Take 1 capsule (60 mg total) by mouth in the morning and at bedtime. 06/07/21   Geradine Girt, DO  gabapentin (NEURONTIN) 300 MG capsule Take 600 mg by mouth 4 (four) times daily. 05/14/16   [provider]  hydrochlorothiazide (HYDRODIURIL) 50 MG tablet Take 1 tablet (50 mg total) by mouth daily. 06/07/21   Geradine Girt, DO  lamoTRIgine (LAMICTAL) 25 MG tablet Take 1 tablet (25 mg total) by mouth daily. 06/07/21   Geradine Girt, DO  lisinopril (ZESTRIL) 20 MG tablet Take 1 tablet (20 mg total) by mouth daily. 06/07/21   Geradine Girt, DO  naloxone [facility]) nasal spray 4 mg/0.1 mL Place 1 spray into the nose as needed (as directed, for overdose).    [provider]  naproxen (NAPROSYN) 500 MG tablet Take 500 mg by mouth 2 (two) times daily with a meal. 02/28/18   [provider]  nicotine (NICODERM CQ - DOSED IN MG/24 HOURS) 14 mg/24hr patch Place 1 patch (14 mg total) onto the skin daily. 06/07/21   Geradine Girt, DO  oxyCODONE (ROXICODONE) 15 MG immediate release tablet Take 15 mg by mouth 4 (four) times daily.    [provider]  pantoprazole (PROTONIX) 40 MG tablet Take 1 tablet (40 mg total) by mouth 2 (two) times daily before a meal. Patient not taking: Reported on 06/04/2021 05/05/18   Cristal Ford, DO  predniSONE (DELTASONE) 10 MG tablet Take 3 tablets (30 mg total) by mouth daily with breakfast. 06/08/21   Geradine Girt, DO  promethazine (PHENERGAN) 25  MG tablet Take 25 mg by mouth every 6 (six) hours as needed for nausea or vomiting.    [provider]  sulfamethoxazole-trimethoprim (BACTRIM DS) 800-160 MG tablet Take 1 tablet by mouth daily. 06/08/21   Geradine Girt, DO  SYMBICORT 160-4.5 MCG/ACT inhaler Inhale 2 puffs into the lungs daily. 02/20/21 02/20/22  Pokhrel, Corrie Mckusick, MD  topiramate (TOPAMAX) 25 MG tablet Take 25 mg by mouth daily as needed (for more than 2 seizures a day). 11/11/19   [provider]    Physical Exam: Vitals:   10/27/21 1530 10/27/21 1647 10/27/21 1700 10/27/21 1715  BP: (!) 102/47 133/79 128/86 119/83  Pulse:  77 77 81 79  Resp: 13 19 12 17   Temp:      TempSrc:      SpO2: 92% 94% 94% 94%  Weight:      Height:        Constitutional: NAD, calm, comfortable Vitals:   10/27/21 1530 10/27/21 1647 10/27/21 1700 10/27/21 1715  BP: (!) 102/47 133/79 128/86 119/83  Pulse: 77 77 81 79  Resp: 13 19 12 17   Temp:      TempSrc:      SpO2: 92% 94% 94% 94%  Weight:      Height:       Eyes: PERRL, lids and conjunctivae normal ENMT: Mucous membranes are moist. Posterior pharynx clear of any exudate or lesions.Normal dentition.  Neck: normal, supple, no masses, no thyromegaly Respiratory: Diminished breathing sound bilaterally, diffuse wheezing, scattered crackles.  Increasing respiratory effort.  Positive signs of accessory muscle use.  Cardiovascular: Regular rate and rhythm, no murmurs / rubs / gallops. No extremity edema. 2+ pedal pulses. No carotid bruits.  Abdomen: no tenderness, no masses palpated. No hepatosplenomegaly. Bowel sounds positive.  Musculoskeletal: no clubbing / cyanosis. No joint deformity upper and lower extremities. Good ROM, no contractures. Normal muscle tone.  Skin: no rashes, lesions, ulcers. No induration Neurologic: CN 2-12 grossly intact. Sensation intact, DTR normal. Strength 5/5 in all 4.  Psychiatric: Lethargic, following simple commands   Labs on Admission: I have  personally reviewed following labs and imaging studies  CBC: Recent Labs  Lab 10/27/21 1449 10/27/21 1510  WBC 11.5*  --   NEUTROABS 7.9*  --   HGB 12.6 12.9  HCT 40.1 38.0  MCV 85.7  --   PLT 277  --    Basic Metabolic Panel: Recent Labs  Lab 10/27/21 1449 10/27/21 1510  NA 137 138  K 3.4* 3.2*  CL 102  --   CO2 24  --   GLUCOSE 109*  --   BUN 16  --   CREATININE 1.51*  --   CALCIUM 9.1  --    GFR: Estimated Creatinine Clearance: 50 mL/min (A) (by C-G formula based on SCr of 1.51 mg/dL (H)). Liver Function Tests: No results for input(s): AST, ALT, ALKPHOS, BILITOT, PROT, ALBUMIN in the last 168 hours. No results for input(s): LIPASE, AMYLASE in the last 168 hours. No results for input(s): AMMONIA in the last 168 hours. Coagulation Profile: No results for input(s): INR, PROTIME in the last 168 hours. Cardiac Enzymes: No results for input(s): CKTOTAL, CKMB, CKMBINDEX, TROPONINI in the last 168 hours. BNP (last 3 results) No results for input(s): PROBNP in the last 8760 hours. HbA1C: No results for input(s): HGBA1C in the last 72 hours. CBG: No results for input(s): GLUCAP in the last 168 hours. Lipid Profile: No results for input(s): CHOL, HDL, LDLCALC, TRIG, CHOLHDL, LDLDIRECT in the last 72 hours. Thyroid Function Tests: No results for input(s): TSH, T4TOTAL, FREET4, T3FREE, THYROIDAB in the last 72 hours. Anemia Panel: No results for input(s): VITAMINB12, FOLATE, FERRITIN, TIBC, IRON, RETICCTPCT in the last 72 hours. Urine analysis:    Component Value Date/Time   COLORURINE YELLOW 06/05/2016 0051   APPEARANCEUR CLOUDY (A) 06/05/2016 0051   LABSPEC 1.016 06/05/2016 0051   PHURINE 6.0 06/05/2016 0051   GLUCOSEU NEGATIVE 06/05/2016 0051   HGBUR NEGATIVE 06/05/2016 0051   BILIRUBINUR NEGATIVE 06/05/2016 0051   KETONESUR NEGATIVE 06/05/2016 0051   PROTEINUR NEGATIVE 06/05/2016 0051   UROBILINOGEN 1.0 07/31/2015 2028   NITRITE NEGATIVE 06/05/2016 0051  LEUKOCYTESUR TRACE (A) 06/05/2016 0051    Radiological Exams on Admission: CT Angio Chest PE W and/or Wo Contrast  Result Date: 10/27/2021 CLINICAL DATA:  Pulmonary embolism (PE) suspected, high prob. eval of intermittent sob and cp x several days with associated cough. Hx COPD and has recently quit smoking cold [place] EXAM: CT ANGIOGRAPHY CHEST WITH CONTRAST TECHNIQUE: Multidetector CT imaging of the chest was performed using the standard protocol during bolus administration of intravenous contrast. Multiplanar CT image reconstructions and MIPs were obtained to evaluate the vascular anatomy. RADIATION DOSE REDUCTION: This exam was performed according to the departmental dose-optimization program which includes automated exposure control, adjustment of the mA and/or kV according to patient size and/or use of iterative reconstruction technique. CONTRAST:  51mL OMNIPAQUE IOHEXOL 350 MG/ML SOLN COMPARISON:  CT chest 06/04/2021, CT angiography chest 05/03/2018, CT angiography chest 03/05/2018 FINDINGS: Cardiovascular: Satisfactory opacification of the pulmonary arteries to the segmental level. No evidence of pulmonary embolism. Normal heart size. No significant pericardial effusion. The thoracic aorta is normal in caliber. Mild atherosclerotic plaque of the thoracic aorta. No coronary artery calcifications. Mediastinum/Nodes: Persistent enlarged bilateral hilar, right greater than left, as well as mediastinal lymphadenopathy. As an example a 1.6 cm right hilar lymph node (7:173) as an example a right paratracheal lymph node measuring 1.6 cm (7:132). No enlarged axillary lymph nodes. Thyroid gland, trachea, and esophagus demonstrate no significant findings. Small hiatal hernia. Lungs/Pleura: Diffuse peribronchovascular ground-glass airspace opacities with interlobular septal wall thickening. Limited evaluation for pulmonary nodule. No pulmonary mass. No pleural effusion. No pneumothorax. Upper Abdomen: No acute  abnormality. Musculoskeletal: No chest wall abnormality. No suspicious lytic or blastic osseous lesions. No acute displaced fracture. Review of the MIP images confirms the above findings. IMPRESSION: 1. No pulmonary embolus. 2. Chronic diffuse peribronchovascular ground-glass airspace opacities with interlobular septal wall thickening. Differential diagnosis includes pulmonary edema, infection (bacterial or fungal), pneumonitis, sarcoidosis, pulmonary alveolar proteinosis. 3. Persistent likely reactive hilar and mediastinal lymphadenopathy. Recommend attention on follow-up. 4. Small hiatal hernia. 5.  Aortic Atherosclerosis (ICD10-I70.0). Electronically Signed   By: Iven Finn M.D.   On: 10/27/2021 17:14   DG Chest Portable 1 View  Result Date: 10/27/2021 CLINICAL DATA:  Pt from home via EMS for eval of intermittent sob and cp x several days with associated coughHx of asthma, COPD, GERD, HTNSmoker EXAM: PORTABLE CHEST 1 VIEW COMPARISON:  06/03/2021 and older studies. FINDINGS: Cardiac silhouette is normal in size. No mediastinal or hilar masses. There are prominent bronchovascular markings most evident in the bases, greater on the right. No lung consolidation. No evidence of edema. No pleural effusion or pneumothorax. Skeletal structures are grossly intact. IMPRESSION: 1. Prominent bronchovascular markings in the lower lungs, right greater than left. This may all be chronic. A component of acute bronchial inflammation/infection should be considered in the proper clinical setting. Electronically Signed   By: Lajean Manes M.D.   On: 10/27/2021 15:21    EKG: Independently reviewed.  Sinus rhythm, no acute ST changes  Assessment/Plan Principal Problem:   COPD (chronic obstructive pulmonary disease) (HCC) Active Problems:   Asthma, chronic, unspecified asthma severity, with acute exacerbation  (please populate well all problems here in Problem List. (For example, if patient is on BP meds at home and  you resume or decide to hold them, it is a problem that needs to be her. Same for CAD, COPD, HLD and so on)  Acute on chronic hypoxic respiratory failure -Clinically suspect Status asthmaticus given the signs of using  accessory muscles, diffuse wheezing even after IV Solu-Medrol treatment. Comparison of her past ABG, his ABG showed new onset of CO2 retention is more worrisome.  Start BiPAP 12/5.  Repeat VBG in 2 hours to make sure CO2 retention improved.  Otherwise we will consider consulting pulmonary and intubation. -Magnesium sulfate 2 g x 1. -Continue IV Solu-Medrol every 6 hours, breathing treatment -ABX -Peak flow -Flutter valve  AKI -BP on the low side.  Received 1 L IV bolus, clinically appears to be euvolemic, hold off maintenance IV fluids. -Hold off HCTZ and lisinopril, as needed hydralazine.  HTN -Hold home BP meds, as needed hydralazine for now  Chronic pain syndrome/narcotic dependence -Given the present status will change her scheduled narcotics to as needed.  Anxiety/depression -We will cut down Xanax dosage -Continue Lamictal and Depakote  DVT prophylaxis: Lovenox Code Status: Full code Family Communication: None at bedside Disposition Plan: Expect more than 2 midnight hospital stay, as patient on BiPAP and has severe respiratory issue. Consults called: NOne Admission status: PCU   Lequita Halt MD [facility] Pager [phone]  10/27/2021, 5:49 PM

## 2021-10-27 NOTE — ED Notes (Signed)
6E called and asked to initiate purple man

## 2021-10-27 NOTE — ED Provider Notes (Signed)
This patient is received at change of shift from Dr. Tomi Bamberger, here with shortness of breath requiring breathing treatments, has elevated troponin to 75 but EKG with diffuse T wave flattening.  I discussed the case with cardiology who does recommend a delta troponin and agreeable with CT angiogram but suggest that unless there is a large rise in the delta troponin the patient can be admitted to the hospital service.  This patient is required multiple breathing treatments, multiple different interventions, multiple consultants including cardiology and hospitalist.  CT scan of the chest does not show any signs of pulmonary embolism but has diffuse pulmonary disease that could be any number of different causes.  Troponin being elevated and patient being hypoxic, she will need to be admitted for acute hypoxic respiratory failure.  Critical care provided  .Critical Care Performed by: Noemi Chapel, MD Authorized by: Noemi Chapel, MD   Critical care provider statement:    Critical care time (minutes):  35   Critical care time was exclusive of:  Separately billable procedures and treating other patients and teaching time   Critical care was necessary to treat or prevent imminent or life-threatening deterioration of the following conditions:  Respiratory failure   Critical care was time spent personally by me on the following activities:  Development of treatment plan with patient or surrogate, discussions with consultants, evaluation of patient's response to treatment, examination of patient, ordering and review of laboratory studies, ordering and review of radiographic studies, ordering and performing treatments and interventions, pulse oximetry, re-evaluation of patient's condition, review of old charts and obtaining history from patient or surrogate   I assumed direction of critical care for this patient from another provider in my specialty: yes     Care discussed with: admitting provider   Comments:           EKG Interpretation  Date/Time:  Saturday October 27 2021 14:19:08 EST Ventricular Rate:  85 PR Interval:  123 QRS Duration: 104 QT Interval:  361 QTC Calculation: 430 R Axis:   70 Text Interpretation: Sinus rhythm Borderline T abnormalities, diffuse leads Confirmed by Dorie Rank [phone]) on 10/27/2021 3:21:50 PM          Noemi Chapel, MD 10/27/21 1732

## 2021-10-28 ENCOUNTER — Encounter (HOSPITAL_COMMUNITY): Payer: Self-pay | Admitting: Internal Medicine

## 2021-10-28 DIAGNOSIS — R9389 Abnormal findings on diagnostic imaging of other specified body structures: Secondary | ICD-10-CM | POA: Diagnosis not present

## 2021-10-28 DIAGNOSIS — J9621 Acute and chronic respiratory failure with hypoxia: Secondary | ICD-10-CM | POA: Diagnosis present

## 2021-10-28 DIAGNOSIS — F1120 Opioid dependence, uncomplicated: Secondary | ICD-10-CM

## 2021-10-28 DIAGNOSIS — N179 Acute kidney failure, unspecified: Secondary | ICD-10-CM

## 2021-10-28 DIAGNOSIS — F32A Depression, unspecified: Secondary | ICD-10-CM

## 2021-10-28 DIAGNOSIS — F419 Anxiety disorder, unspecified: Secondary | ICD-10-CM

## 2021-10-28 DIAGNOSIS — J441 Chronic obstructive pulmonary disease with (acute) exacerbation: Secondary | ICD-10-CM

## 2021-10-28 DIAGNOSIS — E669 Obesity, unspecified: Secondary | ICD-10-CM

## 2021-10-28 DIAGNOSIS — Z720 Tobacco use: Secondary | ICD-10-CM

## 2021-10-28 DIAGNOSIS — J45901 Unspecified asthma with (acute) exacerbation: Secondary | ICD-10-CM

## 2021-10-28 LAB — BLOOD GAS, VENOUS
Acid-Base Excess: 1.7 mmol/L (ref 0.0–2.0)
Bicarbonate: 27 mmol/L (ref 20.0–28.0)
Drawn by: 6385
O2 Saturation: 82 %
Patient temperature: 36.9
pCO2, Ven: 52.1 mmHg (ref 44.0–60.0)
pH, Ven: 7.334 (ref 7.250–7.430)
pO2, Ven: 50.6 mmHg — ABNORMAL HIGH (ref 32.0–45.0)

## 2021-10-28 LAB — BASIC METABOLIC PANEL
Anion gap: 11 (ref 5–15)
BUN: 14 mg/dL (ref 6–20)
CO2: 25 mmol/L (ref 22–32)
Calcium: 9 mg/dL (ref 8.9–10.3)
Chloride: 104 mmol/L (ref 98–111)
Creatinine, Ser: 0.78 mg/dL (ref 0.44–1.00)
GFR, Estimated: >60 mL/min (ref >60)
Glucose, Bld: 154 mg/dL — ABNORMAL HIGH (ref 70–99)
Potassium: 3.8 mmol/L (ref 3.5–5.1)
Sodium: 140 mmol/L (ref 135–145)

## 2021-10-28 LAB — CBC
HCT: 37.4 % (ref 36.0–46.0)
Hemoglobin: 12.2 g/dL (ref 12.0–15.0)
MCH: 27.2 pg (ref 26.0–34.0)
MCHC: 32.6 g/dL (ref 30.0–36.0)
MCV: 83.3 fL (ref 80.0–100.0)
Platelets: 291 10*3/uL (ref 150–400)
RBC: 4.49 MIL/uL (ref 3.87–5.11)
RDW: 14.8 % (ref 11.5–15.5)
WBC: 10 10*3/uL (ref 4.0–10.5)
nRBC: 0.2 % (ref 0.0–0.2)

## 2021-10-28 LAB — BLOOD GAS, ARTERIAL
Acid-Base Excess: 4.5 mmol/L — ABNORMAL HIGH (ref 0.0–2.0)
Bicarbonate: 28.8 mmol/L — ABNORMAL HIGH (ref 20.0–28.0)
FIO2: 80
O2 Saturation: 93.5 %
Patient temperature: 36.4
pCO2 arterial: 43.5 mmHg (ref 32.0–48.0)
pH, Arterial: 7.433 (ref 7.350–7.450)
pO2, Arterial: 65.4 mmHg — ABNORMAL LOW (ref 83.0–108.0)

## 2021-10-28 MED ORDER — FUROSEMIDE 10 MG/ML IJ SOLN
60.0000 mg | Freq: Once | INTRAMUSCULAR | Status: AC
Start: 2021-10-28 — End: 2021-10-28
  Administered 2021-10-28: 60 mg via INTRAVENOUS
  Filled 2021-10-28: qty 6

## 2021-10-28 MED ORDER — OXYCODONE HCL 5 MG PO TABS
10.0000 mg | ORAL_TABLET | Freq: Once | ORAL | Status: AC
  Administered 2021-10-28: 10 mg via ORAL
  Filled 2021-10-28: qty 2

## 2021-10-28 MED ORDER — IPRATROPIUM-ALBUTEROL 0.5-2.5 (3) MG/3ML IN SOLN
3.0000 mL | Freq: Three times a day (TID) | RESPIRATORY_TRACT | Status: DC
  Administered 2021-10-29 – 2021-10-30 (×3): 3 mL via RESPIRATORY_TRACT
  Filled 2021-10-28 (×8): qty 3

## 2021-10-28 MED ORDER — OXYCODONE HCL 5 MG PO TABS
15.0000 mg | ORAL_TABLET | Freq: Four times a day (QID) | ORAL | Status: DC | PRN
  Administered 2021-10-28 – 2021-10-31 (×10): 15 mg via ORAL
  Filled 2021-10-28 (×10): qty 3

## 2021-10-28 MED ORDER — METHYLPREDNISOLONE SODIUM SUCC 125 MG IJ SOLR
120.0000 mg | Freq: Every day | INTRAMUSCULAR | Status: DC
  Administered 2021-10-28 – 2021-10-31 (×4): 120 mg via INTRAVENOUS
  Filled 2021-10-28 (×4): qty 2

## 2021-10-28 MED ORDER — FUROSEMIDE 10 MG/ML IJ SOLN
60.0000 mg | Freq: Once | INTRAMUSCULAR | Status: AC
  Administered 2021-10-28: 60 mg via INTRAVENOUS
  Filled 2021-10-28: qty 6

## 2021-10-28 MED ORDER — BUDESONIDE 0.5 MG/2ML IN SUSP
0.5000 mg | Freq: Two times a day (BID) | RESPIRATORY_TRACT | Status: DC
  Administered 2021-10-29 – 2021-10-30 (×2): 0.5 mg via RESPIRATORY_TRACT
  Filled 2021-10-28 (×5): qty 2

## 2021-10-28 NOTE — Progress Notes (Signed)
Patient refusing breathing treatments this morning stating they are not helping her. Patient has oxygen on at this time. Speaking in full sentences. No SOB or increased WOB. Vitals are stable. RT will continue to monitor patient.

## 2021-10-28 NOTE — Assessment & Plan Note (Addendum)
Continue to encourage tobacco cessation.  Continue albuterol neb/MDI as needed.  Not on long-term controller inhalers outpatient.  Outpatient follow-up with pulmonology.

## 2021-10-28 NOTE — Assessment & Plan Note (Addendum)
Patient presenting to the ED with progressive shortness of breath, oxygen dependent at baseline with 2 L nasal cannula.  On presentation the ED was noted to have significant hypoxia with SPO2 70s on 6 L nasal cannula.  CT angiogram chest negative for pulmonary embolism but with signs concerning for ILD versus sarcoidosis.  Patient was started on neb treatments, IV Solu-Medrol.  ANA, ANCA, CCP, anti-DNA antibody, anti-Jo antibody, antiscleroderma all negative.  Rheumatoid factor slightly elevated 18.8.  Pulmonology was consulted and patient underwent bronchoscopy/EBUS with biopsy.  Patient's oxygen was slowly weaned back to her typical baseline.  We will continue doxycycline 100 mg p.o. twice daily to complete 7-day course.  We will continue prednisone 30 mg p.o. daily until outpatient follow-up with pulmonology in 1-2 weeks.  Continue to encourage tobacco cessation.

## 2021-10-28 NOTE — Progress Notes (Signed)
Progress Note   Patient: ADWOA AXE OVF:[id number] DOB: November 26, 1965 DOA: 10/27/2021     1 DOS: the patient was seen and examined on 10/28/2021   Brief hospital course: Patient is a 56 year old obese African-American female with a past medical history significant for but not limited to asthma questionable history of ILD with possible diagnose sarcoidosis, history of chronic respiratory failure on 2 L, hypertension, GERD, chronic pain syndrome, chronic narcotic dependence, anxiety and depression, tobacco abuse with recent negative smoking who presents with increased worsening shortness of breath for the last 5 days.  She started developing wheezing and a productive cough about 1 week gradually got worse.  She uses home oxygen around-the-clock and states that she started using breathing treatments without little help.  She came to the ED and was found to be saturating in the 70s on 6 L.  CTA of the chest was negative for PE but did demonstrate chronic changes suspicious for ILD versus sarcoid versus an other etiologies.  She was given IV Solu-Medrol and admitted.  Because she continued to worsen oxygen requirement went up and she has placed on BiPAP briefly but now weaned off.  She is now on 15 L high flow nasal cannula and pulmonary has been consulted for further evaluation recommendations.  Assessment and Plan: * Acute on chronic respiratory failure with hypoxia (HCC) Initially suspected that she had status asthmaticus given that she had use of accessory muscles and had diffuse wheezing even after IV Solu-Medrol treatment.  She continues to have significant wheezing so we will increase her steroids back to 120 mg daily -ABG done and she appears compensated.  She was started on BiPAP but now this has been weaned off and she is now on 15 L -Given magnesium -Continue with breathing treatments and flutter valve, incentive sponsor and guaifenesin -She is on p.o. antibiotics with doxycycline -Pulmonary  has been consulted and we have initiated diuresis -Continue to monitor respiratory status carefully and will need to repeat a chest x-ray in a.m. -SpO2: 96 % O2 Flow Rate (L/min): 15 L/min -Continuous pulse oximetry maintain O2 saturation greater than 90% -Continue supplemental oxygen via nasal cannula to home O2 -Patient had bilateral alveolar infiltrates superimposed on her asthma and COPD and pulmonary has been consulted and there is a very broad differential trolled diagnosis including tobacco related pneumonitis versus ILD, DIP.  They felt that cardiogenic pulmonary edema could be superimposed so they are recommending obtaining echocardiogram and continue diuresis -Pulmonary is recommending likely bronchoscopy and EBUS with transbronchial biopsies, nodal biopsies and BAL for culture data, cell count and CD4/CD8 ratio -Pulmonary sent for sputum culture for bacterial, AFB and eosinophil counts -They are ordering autoimmune labs including RF, anti-Jo1 antibody, anti-DNA antibody, CCP, SSA/B, ANCA, ANA, ACE level, HSP panel -They are recommending smoking cessation -They stopped the patient's Dulera with the DuoNeb and Pulmicort and they feel that she may benefit to transition to a LABA, LAMA, ICS at the time of discharge -Pulm is recommending outpatient pulmonary function tests   AKI (acute kidney injury) ([name]) - Patient's BUN/creatinine went from 16/1.51 and is now 14/0.7 -Received IV fluids however appeared volume overloaded and had significant wheezing so we will start diuresis -Avoid nephrotoxic medications, contrast dyes, hypotension and renally dose medications -Repeat CMP in a.m.  Narcotic dependence ([place]) - Continue with home pain regimen of oxycodone IR 15 mg 4 times daily  Obesity (BMI 84-16.6) -Complicates overall prognosis and care -Estimated body mass index is 33.59 kg/m as calculated  from the following:   Height as of this encounter: _0  (1.676 m).   Weight as of this  encounter: 94.4 kg.  -Weight Loss and Dietary Counseling given   COPD (chronic obstructive pulmonary disease) ([place])- (present on admission) - As above  Tobacco abuse- (present on admission) - Smoking cessation counseling given   Subjective: Seen and examined at bedside and she was extremely short of breath and still wheezing.  Did not feel as well.  Oxygen requirements subsequently went to 15 L given the she had a hard time maintaining her saturations.  Pulmonary was consulted.  She is complaining about her pain as well and she takes 50 mg of oxycodone IR 4 times daily.  She denies any chest pain currently.  Has some leg swelling but this is mild.  No other concerns complaints this time.  Physical Exam: Vitals:   10/28/21 0354 10/28/21 0836 10/28/21 1343 10/28/21 1600  BP: (!) 143/76 (!) 146/83  (!) 154/80  Pulse: 86 74 73 71  Resp:  _1 Temp: 98.7 F (37.1 C) (!) 96.5 F (35.8 C) (!) 97.4 F (36.3 C) (!) 97.5 F (36.4 C)  TempSrc: Oral Axillary Axillary Axillary  SpO2: 90% 94% 96% 96%  Weight:      Height:       Examination: Physical Exam:  Constitutional: WN/WD morbidly obese African-American female currently in NAD and appears calm and comfortable Neck: Appears normal but difficult to assess JVD Respiratory: Managed to auscultation bilaterally with extremely coarse breath sounds and she has significant wheezing and rhonchi.  She has increased respiratory effort and is associated with some slight crackles are diffuse.  Wearing supplemental oxygen via high flow nasal cannula Cardiovascular: Slightly tachycardic rate but regular rhythm.,  S1.  Some edema Abdomen: Soft, non-tender, distended secondary to body habitus. GU: Deferred.  Data Reviewed:  I have independently reviewed and interpreted the patient's laboratory data including her BMP and CBC as well as reviewed her CT scan  Her AKI is improved and her leukocytosis has resolved.  Family Communication: No family  currently at bedside  Disposition: Status is: Inpatient Remains inpatient appropriate because: She remains dyspneic and hypoxic  Planned Discharge Destination: Home with Home Health  DVT Prophylaxis: Enoxaparin 40 mg sq q24h  Author: Raiford Noble, DO [facility]  10/28/2021 7:51 PM  For on call review [email or web address].

## 2021-10-28 NOTE — Assessment & Plan Note (Addendum)
Continue home pain regimen of oxycodone IR 15 mg 4 times daily, gabapentin, baclofen, cyclobenzaprine.  Outpatient follow-up with PCP.

## 2021-10-28 NOTE — Assessment & Plan Note (Addendum)
Body mass index is 33.59 kg/m. Discussed with patient needs for aggressive lifestyle changes/weight loss as this complicates all facets of care.  Outpatient follow-up with PCP.

## 2021-10-28 NOTE — Progress Notes (Signed)
RT to pt's room due to BIPAP alarming. Pt leaning towards side of bed, pulling BIPAP mask off, and refusing to wear. Pt placed on 3L [place] with SpO2 in the 80's. Pt switched to 6L [place], SpO2 increased to upper 80's. RT will continue to monitor.

## 2021-10-28 NOTE — Assessment & Plan Note (Addendum)
Continue home Duloxetine 60 g p.o. every 12 hours, Depakote 500 mg p.o. daily, and Xanax 1 mg p.o. 3 times daily as needed.

## 2021-10-28 NOTE — Assessment & Plan Note (Addendum)
Continue to encourage tobacco cessation. 

## 2021-10-28 NOTE — Consult Note (Addendum)
NAME:  Joan [name], MRN:  [id number], DOB:  1966/06/08, LOS: 1 ADMISSION DATE:  10/27/2021, CONSULTATION DATE: 10/28/2021 REFERRING MD: Dr. Alfredia Ferguson, CHIEF COMPLAINT: Abnormal CT chest  History of Present Illness:  56 year old patient with a history of tobacco use (35 pack years), childhood asthma with asthma/COPD into adulthood, GERD, IBS, migraines.  She is admitted with acute hypoxic respiratory failure in the setting of bilateral alveolar pulmonary infiltrates.  Review of her imaging reveals that she is and migratory bilateral groundglass infiltrates dating back to at least 2019, associated with bilateral mediastinal and hilar lymphadenopathy.  No significant honeycomb change or traction bronchiectasis.  No bronchoscopy or biopsy has been performed.  Rheumatoid factor elevated 3 years ago and 4 months ago.  ACE level 06/04/21 > 16.  No known history of any autoimmune disease.  On this admission she states that she was well until about 5 days ago when she began to experience progressive exertional shortness of breath.  She notes that she also was having increased abdominal girth, hand edema.  She stopped taking her diuretics and some of her blood pressure medications several months ago.  She has had cough with some clear sputum, occasionally darker.  No hemoptysis.  Her albuterol use has increased over the last week.  CT-PA was performed on admission 2/11 as below.  She has been treated with furosemide, bronchodilators, empiric Solu-Medrol 120 daily, doxycycline.  She has had escalating oxygen needs, currently on 15 L/min.  She was on BiPAP 2/11, presumably for hypoxemia.  No evidence of hypercapnia or respiratory acidosis on ABG either 2/11 or 2/12.   Pertinent  Medical History   Past Medical History:  Diagnosis Date   Anemia    Anxiety    Asthma    Chronic back pain    Chronic neck pain    Complex regional pain syndrome    COPD (chronic obstructive pulmonary disease) (HCC)    GERD  (gastroesophageal reflux disease)    Hematuria 09/07/2013   Hypertension    IBS (irritable bowel syndrome)    Migraine    Paresthesia of left arm     Significant Hospital Events: Including procedures, antibiotic start and stop dates in addition to other pertinent events   CT-PA chest 2/11 >> bilateral scattered groundglass attenuation all lobes, most severe in the upper zones with some basilar sparing.  There is no traction bronchiectasis, honeycomb or subpleural reticulation.  Slightly different distribution compared with prior CTs going back to 2019.  No apparent cyst formation.  There is prominent enlarged hilar mediastinal lymphadenopathy. Rheumatoid factor, elevated 06/05/2021, also 05/05/2018 TTE 02/19/2021 >> LVEF 60-65% with normal diastolic function.  Mildly enlarged left atrium, intra-atrial color-flow suggestive of possible shunt, mildly dilated RA  Interim History / Subjective:  Persistent dyspnea.  Still feels like she is edematous.  She was on BiPAP 2/11, does not believe that it really helped her in any way.  No increased work of breathing.  Objective   Blood pressure (!) 146/83, pulse 73, temperature (!) 97.4 F (36.3 C), temperature source Axillary, resp. rate 18, height _0  (1.676 m), weight 94.4 kg, SpO2 96 %.        Intake/Output Summary (Last 24 hours) at 10/28/2021 1501 Last data filed at 10/28/2021 1200 Gross per 24 hour  Intake --  Output 550 ml  Net -550 ml   Filed Weights   10/27/21 1421 10/27/21 2156  Weight: 94.8 kg 94.4 kg    Examination: General: Pleasant obese woman, laying  in bed in no distress HENT: Oropharynx clear.  No stridor. Lungs: Bilateral inspiratory crackles and rhonchi.  No wheezing Cardiovascular: Distant, regular, no murmur Abdomen: Obese, nondistended with positive bowel sounds Extremities: Trace ankle edema Neuro: Awake, alert, interacting appropriately, answers all questions.  Moves extremities GU: Deferred  Resolved Hospital  Problem list     Assessment & Plan:  Acute hypoxemic respiratory failure and in the setting of bilateral alveolar infiltrates, superimposed on her asthma/COPD.  Review of imaging shows that she has had similar bilateral infiltrates with associated lymphadenopathy for several years.  Differential diagnosis here includes tobacco related pneumonitis such as RB-ILD, DIP.  Other possibilities include sarcoidosis, autoimmune induced interstitial lung disease, eosinophilic pneumonia (history of asthma), hypersensitivity pneumonitis or other exposure such as occupational or illicit substances.  RB-ILD and/or DIP would seem to be the most likely explanation.  Need to consider superimposed cardiogenic pulmonary edema, prior echocardiogram with a possible ASD but intact left and right heart function.  Recommendations: -Repeat ABG reveals adequate ventilation.  No clear indication for BiPAP at this time. -Ultimately patient needs a bronchoscopy and EBUS with transbronchial biopsies, nodal biopsies and BAL for culture data, cell count, CD4/CD8 ratio.  Unfortunately probably a poor candidate for bronchoscopy currently given her current oxygen needs.  We will need to push for this once acute respiratory status improves. -Agree with empiric diuresis as she can tolerate -Given her degree of respiratory failure agree with empiric corticosteroids, although improvement in her infiltrates may actually impact utility of diagnostics (bronchoscopy etc.) -Sputum culture now for bacterial, AFB organisms, eosinophil count -Autoimmune labs: RF, anti-Jo1 antibody, anti-DNA antibody, CCP, SSA/B, ANCA, ANA, ACE level, HSP panel -Repeat echocardiogram with a bubble study to better evaluate current LV function, RV function, possible shunt -Definitive treatment for DIP or RB-ILD is smoking cessation.  Will be critical for her to stop. -Stop Dulera as is redundant with DuoNeb/Pulmicort nebs. -Patient may benefit from a transition to  LABA/LAMA/ICS at the time of discharge.   -She needs outpatient follow-up and outpatient pulmonary function testing   Labs   CBC: Recent Labs  Lab 10/27/21 1449 10/27/21 1510 10/27/21 2054 10/28/21 0601  WBC 11.5*  --   --  10.0  NEUTROABS 7.9*  --   --   --   HGB 12.6 12.9 13.3 12.2  HCT 40.1 38.0 39.0 37.4  MCV 85.7  --   --  83.3  PLT 277  --   --  295    Basic Metabolic Panel: Recent Labs  Lab 10/27/21 1449 10/27/21 1510 10/27/21 2054 10/28/21 0601  NA 137 138 139 140  K 3.4* 3.2* 3.8 3.8  CL 102  --   --  104  CO2 24  --   --  25  GLUCOSE 109*  --   --  154*  BUN 16  --   --  14  CREATININE 1.51*  --   --  0.78  CALCIUM 9.1  --   --  9.0   GFR: Estimated Creatinine Clearance: 94.1 mL/min (by C-G formula based on SCr of 0.78 mg/dL). Recent Labs  Lab 10/27/21 1449 10/28/21 0601  WBC 11.5* 10.0    Liver Function Tests: No results for input(s): AST, ALT, ALKPHOS, BILITOT, PROT, ALBUMIN in the last 168 hours. No results for input(s): LIPASE, AMYLASE in the last 168 hours. No results for input(s): AMMONIA in the last 168 hours.  ABG    Component Value Date/Time   PHART 7.433 10/28/2021 1342  PCO2ART 43.5 10/28/2021 1342   PO2ART 65.4 (L) 10/28/2021 1342   HCO3 28.8 (H) 10/28/2021 1342   TCO2 28 10/27/2021 2054   ACIDBASEDEF 0.2 05/03/2018 0015   O2SAT 93.5 10/28/2021 1342     Coagulation Profile: No results for input(s): INR, PROTIME in the last 168 hours.  Cardiac Enzymes: No results for input(s): CKTOTAL, CKMB, CKMBINDEX, TROPONINI in the last 168 hours.  HbA1C: No results found for: HGBA1C  CBG: No results for input(s): GLUCAP in the last 168 hours.  Review of Systems:   As per HPI  Past Medical History:  She,  has a past medical history of Anemia, Anxiety, Asthma, Chronic back pain, Chronic neck pain, Complex regional pain syndrome, COPD (chronic obstructive pulmonary disease) ([place]), GERD (gastroesophageal reflux disease), Hematuria  (09/07/2013), Hypertension, IBS (irritable bowel syndrome), Migraine, and Paresthesia of left arm.   Surgical History:   Past Surgical History:  Procedure Laterality Date   DILITATION & CURRETTAGE/HYSTROSCOPY WITH THERMACHOICE ABLATION N/A 08/17/2013   Procedure: DILATATION & CURETTAGE/HYSTEROSCOPY WITH THERMACHOICE ABLATION;  Surgeon: Jonnie Kind, MD;  Location: [facility];  Service: Gynecology;  Laterality: N/A;  18 ml D5W in & out; total therapy time= 9 minutes 33 seconds; temp= 87 degrees celcius   HERNIA REPAIR     ventral hernia   POLYPECTOMY N/A 08/17/2013   Procedure: REMOVAL ENDOMETRIAL POLYP;  Surgeon: Jonnie Kind, MD;  Location: [facility];  Service: Gynecology;  Laterality: N/A;   TUBAL LIGATION     ULNAR NERVE REPAIR Left    [facility] 2013     Social History:  Patient is from [place], who live there and also [place].  Lived in an older home in the past but no known mold exposure Was in the [employer], worked in supply.  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  Tobacco use approximate 35 pack years.  No other illicit substances or inhaled exposures  Family History:  Her family history includes Cancer in her maternal aunt, maternal grandmother, and sister; Diabetes in her brother and sister; Heart disease in her brother and sister; Hypertension in her sister; Other in her mother.  No known history of autoimmune disease, sarcoidosis etc.  Allergies Allergies  Allergen Reactions   Gabapentin Other (See Comments) and Hypertension    Elevated BP requiring doubling of current meds   Other Other (See Comments)    Unnamed IV meds for migraine, patient stated it made her feel like "something was crawling all over" her body     Home Medications  Prior to Admission medications   Medication Sig Start Date End Date Taking? Authorizing Provider  albuterol (PROVENTIL) (2.5 MG/3ML) 0.083% nebulizer solution Take 3 mLs (2.5 mg total) by  nebulization every 6 (six) hours as needed for wheezing or shortness of breath. 03/18/20 10/28/21 Yes Antonieta Pert, MD  albuterol (VENTOLIN HFA) 108 (90 Base) MCG/ACT inhaler Inhale 2 puffs into the lungs every 4 (four) hours as needed for wheezing or shortness of breath.   Yes [provider]  ALPRAZolam Duanne Moron) 1 MG tablet Take 1 mg by mouth 3 (three) times daily.   Yes [provider]  baclofen (LIORESAL) 10 MG tablet Take 10 mg by mouth in the morning, at noon, and at bedtime. Morning, afternon, and   Yes [provider]  divalproex (DEPAKOTE) 500 MG DR tablet Take 1 tablet (500 mg total) by mouth daily. 06/07/21  Yes Vann, Jessica U, DO  DULoxetine (CYMBALTA) 60  MG capsule Take 1 capsule (60 mg total) by mouth in the morning and at bedtime. 06/07/21  Yes Vann, Jessica U, DO  gabapentin (NEURONTIN) 300 MG capsule Take 600 mg by mouth 4 (four) times daily. 05/14/16  Yes [provider]  hydrochlorothiazide (HYDRODIURIL) 50 MG tablet Take 1 tablet (50 mg total) by mouth daily. 06/07/21  Yes Vann, Jessica U, DO  lisinopril (ZESTRIL) 20 MG tablet Take 1 tablet (20 mg total) by mouth daily. 06/07/21  Yes Vann, Jessica U, DO  oxyCODONE (ROXICODONE) 15 MG immediate release tablet Take 15 mg by mouth 4 (four) times daily.   Yes [provider]  topiramate (TOPAMAX) 25 MG tablet Take 25 mg by mouth daily as needed (for more than 2 seizures a day). 11/11/19  Yes [provider]  Cholecalciferol 125 MCG (5000 UT) TABS Take 10,000 Units by mouth daily. Patient not taking: Reported on 10/28/2021    [provider]  cyclobenzaprine (FLEXERIL) 5 MG tablet Take 5 mg by mouth 3 (three) times daily as needed for muscle spasms. Patient not taking: Reported on 10/28/2021 01/07/20   [provider]  lamoTRIgine (LAMICTAL) 25 MG tablet Take 1 tablet (25 mg total) by mouth daily. Patient not taking: Reported on 10/28/2021 06/07/21   Geradine Girt, DO  naloxone  [facility]) nasal spray 4 mg/0.1 mL Place 1 spray into the nose as needed (as directed, for overdose). Patient not taking: Reported on 10/28/2021    [provider]  nicotine (NICODERM CQ - DOSED IN MG/24 HOURS) 14 mg/24hr patch Place 1 patch (14 mg total) onto the skin daily. Patient not taking: Reported on 10/28/2021 06/07/21   Geradine Girt, DO  pantoprazole (PROTONIX) 40 MG tablet Take 1 tablet (40 mg total) by mouth 2 (two) times daily before a meal. Patient not taking: Reported on 06/04/2021 05/05/18   Cristal Ford, DO  predniSONE (DELTASONE) 10 MG tablet Take 3 tablets (30 mg total) by mouth daily with breakfast. Patient not taking: Reported on 10/28/2021 06/08/21   Geradine Girt, DO  sulfamethoxazole-trimethoprim (BACTRIM DS) 800-160 MG tablet Take 1 tablet by mouth daily. Patient not taking: Reported on 10/28/2021 06/08/21   Geradine Girt, DO  SYMBICORT 160-4.5 MCG/ACT inhaler Inhale 2 puffs into the lungs daily. Patient not taking: Reported on 10/28/2021 02/20/21 02/20/22  Flora Lipps, MD     Critical care time: NA     Baltazar Apo, MD, PhD 10/28/2021, 3:25 PM  Pulmonary and Critical Care [phone] or if no answer before 7:00PM call [phone] For any issues after 7:00PM please call eLink [phone]

## 2021-10-28 NOTE — Progress Notes (Addendum)
Pt Oxygen sats dropped and sustaining at 85. O2 requirement increased from 6L to 10 L on the [place]ter. O2 sats went up to 88, with occasional fluctuations between 84 and 88. O2 sats dropped again to 87%. RN increased O2 from 10L to 15L. Pt asymptomatic and states "I feel fine"  MD notified and communicated throughout. Respiratory called to put pt back on Bipap and to obtain ABG.  RN continues to monitor.  O2 went up to 95%. Pt refused Bipap and opted for a breathing treatment

## 2021-10-28 NOTE — Assessment & Plan Note (Addendum)
Creatinine elevated 1.5 on admission, received IV diuresis with improvement of creatinine to 0.64 at time of discharge.  Recommend repeat BMP 1 week.

## 2021-10-28 NOTE — Hospital Course (Addendum)
Joan [name] is a 56 year old female with past medical history significant for asthma, chronic respiratory failure on 2 L nasal cannula, essential hypertension, GERD, chronic pain syndrome with narcotic dependence, anxiety/depression, tobacco abuse, who presented to [facility] ED on 2/11 with progressive shortness of breath.  Onset 5 days prior with associated wheezing, productive cough.  Patient reports home breathing treatments/nebs did not help symptoms.  Denies any recent sick contacts, no home medication changes.  In the ED, patient was noted to be saturating in the 70s on 6 L nasal cannula.  CT angiogram chest negative for pulmonary embolism but did demonstrate chronic changes concerning for ILD versus sarcoid.  Patient was started on neb treatments, IV steroids.  Hospitalist service consulted for further evaluation and management of acute on chronic hypoxic respiratory failure concerning for ILD versus sarcoidosis.

## 2021-10-29 ENCOUNTER — Inpatient Hospital Stay (HOSPITAL_COMMUNITY): Payer: Medicare Other

## 2021-10-29 DIAGNOSIS — Q211 Atrial septal defect, unspecified: Secondary | ICD-10-CM

## 2021-10-29 DIAGNOSIS — J9621 Acute and chronic respiratory failure with hypoxia: Principal | ICD-10-CM

## 2021-10-29 DIAGNOSIS — D72829 Elevated white blood cell count, unspecified: Secondary | ICD-10-CM

## 2021-10-29 LAB — CBC WITH DIFFERENTIAL/PLATELET
Abs Immature Granulocytes: 0.13 10*3/uL — ABNORMAL HIGH (ref 0.00–0.07)
Basophils Absolute: 0 10*3/uL (ref 0.0–0.1)
Basophils Relative: 0 %
Eosinophils Absolute: 0 10*3/uL (ref 0.0–0.5)
Eosinophils Relative: 0 %
HCT: 39.2 % (ref 36.0–46.0)
Hemoglobin: 12.8 g/dL (ref 12.0–15.0)
Immature Granulocytes: 1 %
Lymphocytes Relative: 16 %
Lymphs Abs: 2.4 10*3/uL (ref 0.7–4.0)
MCH: 27.4 pg (ref 26.0–34.0)
MCHC: 32.7 g/dL (ref 30.0–36.0)
MCV: 83.9 fL (ref 80.0–100.0)
Monocytes Absolute: 0.7 10*3/uL (ref 0.1–1.0)
Monocytes Relative: 5 %
Neutro Abs: 11.5 10*3/uL — ABNORMAL HIGH (ref 1.7–7.7)
Neutrophils Relative %: 78 %
Platelets: 344 10*3/uL (ref 150–400)
RBC: 4.67 MIL/uL (ref 3.87–5.11)
RDW: 15 % (ref 11.5–15.5)
WBC: 14.8 10*3/uL — ABNORMAL HIGH (ref 4.0–10.5)
nRBC: 0.2 % (ref 0.0–0.2)

## 2021-10-29 LAB — ANTI-DNA ANTIBODY, DOUBLE-STRANDED: ds DNA Ab: <1 IU/mL (ref 0–9)

## 2021-10-29 LAB — COMPREHENSIVE METABOLIC PANEL
ALT: 19 U/L (ref 0–44)
AST: 25 U/L (ref 15–41)
Albumin: 2.9 g/dL — ABNORMAL LOW (ref 3.5–5.0)
Alkaline Phosphatase: 66 U/L (ref 38–126)
Anion gap: 11 (ref 5–15)
BUN: 20 mg/dL (ref 6–20)
CO2: 29 mmol/L (ref 22–32)
Calcium: 8.9 mg/dL (ref 8.9–10.3)
Chloride: 98 mmol/L (ref 98–111)
Creatinine, Ser: 0.82 mg/dL (ref 0.44–1.00)
GFR, Estimated: >60 mL/min (ref >60)
Glucose, Bld: 129 mg/dL — ABNORMAL HIGH (ref 70–99)
Potassium: 3.5 mmol/L (ref 3.5–5.1)
Sodium: 138 mmol/L (ref 135–145)
Total Bilirubin: 0.4 mg/dL (ref 0.3–1.2)
Total Protein: 6.8 g/dL (ref 6.5–8.1)

## 2021-10-29 LAB — ECHOCARDIOGRAM COMPLETE BUBBLE STUDY
AR max vel: 3.56 cm2
AV Peak grad: 5.1 mmHg
Ao pk vel: 1.13 m/s
Area-P 1/2: 3.4 cm2
MV M vel: 4.8 m/s
MV Peak grad: 92.2 mmHg
S' Lateral: 2.5 cm

## 2021-10-29 LAB — SJOGRENS SYNDROME-B EXTRACTABLE NUCLEAR ANTIBODY: SSB (La) (ENA) Antibody, IgG: <0.2 AI (ref 0.0–0.9)

## 2021-10-29 LAB — SJOGRENS SYNDROME-A EXTRACTABLE NUCLEAR ANTIBODY: SSA (Ro) (ENA) Antibody, IgG: <0.2 AI (ref 0.0–0.9)

## 2021-10-29 LAB — MAGNESIUM: Magnesium: 2.1 mg/dL (ref 1.7–2.4)

## 2021-10-29 LAB — RHEUMATOID FACTOR: Rheumatoid fact SerPl-aCnc: 18.8 IU/mL — ABNORMAL HIGH (ref <14.0)

## 2021-10-29 LAB — ANTI-JO 1 ANTIBODY, IGG: Anti JO-1: <0.2 AI (ref 0.0–0.9)

## 2021-10-29 LAB — CYCLIC CITRUL PEPTIDE ANTIBODY, IGG/IGA: CCP Antibodies IgG/IgA: 3 units (ref 0–19)

## 2021-10-29 LAB — ANTI-SCLERODERMA ANTIBODY: Scleroderma (Scl-70) (ENA) Antibody, IgG: <0.2 AI (ref 0.0–0.9)

## 2021-10-29 LAB — PHOSPHORUS: Phosphorus: 2.6 mg/dL (ref 2.5–4.6)

## 2021-10-29 LAB — ANGIOTENSIN CONVERTING ENZYME: Angiotensin-Converting Enzyme: 19 U/L (ref 14–82)

## 2021-10-29 MED ORDER — DULOXETINE HCL 60 MG PO CPEP
60.0000 mg | ORAL_CAPSULE | Freq: Two times a day (BID) | ORAL | Status: DC
  Administered 2021-10-29 – 2021-10-31 (×4): 60 mg via ORAL
  Filled 2021-10-29 (×5): qty 1

## 2021-10-29 MED ORDER — BACLOFEN 10 MG PO TABS
10.0000 mg | ORAL_TABLET | Freq: Three times a day (TID) | ORAL | Status: DC
  Administered 2021-10-29 – 2021-10-31 (×5): 10 mg via ORAL
  Filled 2021-10-29 (×5): qty 1

## 2021-10-29 MED ORDER — FUROSEMIDE 10 MG/ML IJ SOLN
60.0000 mg | Freq: Once | INTRAMUSCULAR | Status: AC
  Administered 2021-10-29: 60 mg via INTRAVENOUS
  Filled 2021-10-29: qty 6

## 2021-10-29 NOTE — Progress Notes (Addendum)
Progress Note   Patient: Joan [name] VOZ:[id number] DOB: 26-Sep-1965 DOA: 10/27/2021     2 DOS: the patient was seen and examined on 10/29/2021   Brief hospital course: Patient is a 56 year old obese African-American female with a past medical history significant for but not limited to asthma questionable history of ILD with possible diagnose sarcoidosis, history of chronic respiratory failure on 2 L, hypertension, GERD, chronic pain syndrome, chronic narcotic dependence, anxiety and depression, tobacco abuse with recent negative smoking who presents with increased worsening shortness of breath for the last 5 days.  She started developing wheezing and a productive cough about 1 week gradually got worse.  She uses home oxygen around-the-clock and states that she started using breathing treatments without little help.  She came to the ED and was found to be saturating in the 70s on 6 L.  CTA of the chest was negative for PE but did demonstrate chronic changes suspicious for ILD versus sarcoid versus an other etiologies.  She was given IV Solu-Medrol and admitted.  Because she continued to worsen oxygen requirement went up and she has placed on BiPAP briefly but now weaned off.  She is now on 15 L high flow nasal cannula and pulmonary has been consulted for further evaluation recommendations.  He was given some diuresis yesterday and will give another dose today.  She underwent a echocardiogram with bubble study today to evaluate for shunt.  Pulmonary recommending continuing Solu-Medrol as well as following up on serology labs as well as sputum culture.  They also recommended continue Pulmicort and DuoNebs and try and wean her off of O2.  They are discussing the possibility of bronchoscopy with her and they are recommending continuing doxycycline for now.  Assessment and Plan: * Acute on chronic respiratory failure with hypoxia (HCC) Initially suspected that she had status asthmaticus given that she had  use of accessory muscles and had diffuse wheezing even after IV Solu-Medrol treatment.  She continues to have significant wheezing so we will increase her steroids back to 120 mg daily -ABG done and she appears compensated.  She was started on BiPAP but now this has been weaned off and she is now on 15 L -Given magnesium -Continue with breathing treatments and flutter valve, incentive sponsor and guaifenesin -She is on p.o. antibiotics with doxycycline -Pulmonary has been consulted and we have initiated diuresis and given IV 60 mg yesterday and again today -Pulmonary recommending a echocardiogram with bubble study EF was 60 to 65% with no regional wall motion abnormalities with mild left ventricular hypertrophy and right ventricular diastolic parameters are normal.  There is no evidence of an intra-atrial shunt -Continue to monitor respiratory status carefully and will need to repeat a chest x-ray in a.m. -SpO2: 96 % O2 Flow Rate (L/min): 15 L/min -Continuous pulse oximetry maintain O2 saturation greater than 90%  -Continue supplemental oxygen via nasal cannula to home O2 -Patient had bilateral alveolar infiltrates superimposed on her asthma and COPD and pulmonary has been consulted and there is a very broad differential trolled diagnosis including tobacco related pneumonitis versus ILD, DIP.  They felt that cardiogenic pulmonary edema could be superimposed so they are recommending obtaining echocardiogram and continue diuresis -Pulmonary is recommending likely bronchoscopy and EBUS with transbronchial biopsies, nodal biopsies and BAL for culture data, cell count and CD4/CD8 ratio -Pulmonary sent for sputum culture for bacterial, AFB and eosinophil counts -They are ordering autoimmune labs including RF, anti-Jo1 antibody, anti-DNA antibody, CCP, SSA/B, ANCA, ANA, ACE  level, HSP panel will need to follow-up on serologies -They are recommending smoking cessation -They stopped the patient's Dulera with  the DuoNeb and Pulmicort and they feel that she may benefit to transition to a LABA, LAMA, ICS at the time of discharge -Pulm is recommending outpatient pulmonary function tests -Appreciate pulmonary evaluation recommendations   Leukocytosis -WBC went from 10.0 -> 14.8 -In the setting of steroid demargination -Continue monitor and trend and repeat CMP in a.m. and continue with doxycycline.  Anxiety and depression -Continue with duloxetine 100 g p.o. every 12 and lamotrigine 25 mg p.o. daily  AKI (acute kidney injury) ([place]) -Patient's BUN/creatinine went from 16/1.51 and is now 14/0.78 -> 20/0.82 -Received IV fluids however appeared volume overloaded and had significant wheezing so we will start diuresis and received IV Lasix 60 mg x 1 yesterday and again this afternoon -Avoid nephrotoxic medications, contrast dyes, hypotension and renally dose medications -Repeat CMP in a.m.  Narcotic dependence ([place]) -Continue with home pain regimen of oxycodone IR 15 mg 4 times daily -Continue with gabapentin, baclofen, cyclobenzaprine for her chronic pain syndrome and have put her back on her gabapentin 600 mg p.o. 4 times daily as well as baclofen 10 mg 3 times daily and cyclobenzaprine as needed  Obesity (BMI 90-30.0) -Complicates overall prognosis and care -Estimated body mass index is 33.59 kg/m as calculated from the following:   Height as of this encounter: _0  (1.676 m).   Weight as of this encounter: 94.4 kg.  -Weight Loss and Dietary Counseling given   COPD (chronic obstructive pulmonary disease) ([place])- (present on admission) -As above  Tobacco abuse- (present on admission) -Smoking cessation counseling given -Continue with nicotine patch 14 mg transdermally every 24   Subjective: Seen and examined at bedside and she was frustrated with her care and did not understand why she be getting all these tests.  She continues to go back to her prior hospitalization and states that she is  "never been treated with these regimens this when she comes in".   Physical Exam: Vitals:   10/29/21 1700 10/29/21 1950 10/29/21 2016 10/29/21 2054  BP: (!) 153/79     Pulse: 66     Resp:      Temp:  98.4 F (36.9 C)    TempSrc:  Oral    SpO2: 97%  100% 93%  Weight:      Height:       Examination: Physical Exam:  Constitutional: WN/WD obese AAF who appears agitated and frustrated Respiratory: Diminished to auscultation bilaterally with coarse breath sounds and significant expiratory wheezing and rhonchi.no accessory muscle due to and she is slightly tachypneic.  Wearing 9 L of high flow nasal cannula Cardiovascular: Mildly tachycardic, no murmurs / rubs / gallops. S1 and S2 auscultated.  1+ lower extremity edema Abdomen: Soft, non-tender, distended secondary body habitus.  Bowel sounds positive.  GU: Deferred.  Data Reviewed:  Personally reviewed and interpreted patient's laboratory data including her CBC and CMP  Patient's WBC trended upwards to 14.8 and blood sugar is elevated at 129  Family Communication: No family present at bedside   Disposition: Status is: Inpatient Remains inpatient appropriate because: Continues to be Hypoxic  Planned Discharge Destination: Home with Home Health  DVT Prophylaxis: Enoxaparin 40 mg sq q24h  Author: Raiford Noble, DO [facility] 10/29/2021 9:17 PM  For on call review [email or web address].

## 2021-10-29 NOTE — Progress Notes (Signed)
°   10/29/21 1415  Clinical Encounter Type  Visited With Patient;Health care provider  Visit Type Initial  Referral From Nurse  Consult/Referral To Chaplain   Chaplain Melvenia Beam met with patient at bed side regarding the completion of Advance Directive. Chaplain explained that A.D. is only used when patient is unable to make medical decisions regarding medical care for herself providing instruction to the doctors and her agents of how patient wishes to be cared for.  Chaplain explained the importance of having conversation with those she wants to name as H.C.P.O.A. prior to naming them as her agents. So they are aware of her wishes.   Chaplain provided patient a blank copy and provided patient instruction for the completion of Parts A and B of the A.D. Chaplain instructed patient to have the nurse contact [place] [facility] when she has completed Parts A and B, so that we can arrange for Notary and witnesses to assist with completion of Part C.   Chaplain Morene Crocker may be contacted at [phone].

## 2021-10-29 NOTE — Progress Notes (Signed)
Echocardiogram 2D Echocardiogram has been performed.  Joan [name] 10/29/2021, 9:04 AM

## 2021-10-29 NOTE — Assessment & Plan Note (Addendum)
Etiology likely secondary to steroid effect/de-marginalization.

## 2021-10-29 NOTE — Progress Notes (Signed)
NAME:  Joan [name], MRN:  [id number], DOB:  April 30, 1966, LOS: 2 ADMISSION DATE:  10/27/2021, CONSULTATION DATE:  2/12 REFERRING MD:  Alfredia Ferguson, CHIEF COMPLAINT:  Abnormal CT chest   History of Present Illness:  56 y/o lady admitted on 2/11 in the setting of dyspnea, found to have acute respiratory failure with hypoxemia in the setting of bilateral pulmonary opacities and mediastinal adenopathy. PCCM consulted for further evaluation.   Pertinent  Medical History  Anemia Anxiety Chronic hypoxemic respiratory failure COPD GERD Hypertension IBS Migraine Cigarette smoker  Significant Hospital Events: Including procedures, antibiotic start and stop dates in addition to other pertinent events   CT-PA chest 2/11 >> bilateral scattered groundglass attenuation all lobes, most severe in the upper zones with some basilar sparing.  There is no traction bronchiectasis, honeycomb or subpleural reticulation.  Slightly different distribution compared with prior CTs going back to 2019.  No apparent cyst formation.  There is prominent enlarged hilar mediastinal lymphadenopathy. Rheumatoid factor, elevated 06/05/2021, also 05/05/2018 TTE 02/19/2021 >> LVEF 60-65% with normal diastolic function.  Mildly enlarged left atrium, intra-atrial color-flow suggestive of possible shunt, mildly dilated RA  Interim History / Subjective:   Feels about the same but breathing has improved since admission  Objective   Blood pressure (!) 156/93, pulse 63, temperature 98.2 F (36.8 C), temperature source Oral, resp. rate 16, height 5\' 6"  (1.676 m), weight 94.4 kg, SpO2 97 %.        Intake/Output Summary (Last 24 hours) at 10/29/2021 1218 Last data filed at 10/28/2021 0240 Gross per 24 hour  Intake --  Output 750 ml  Net -750 ml   Filed Weights   10/27/21 1421 10/27/21 2156  Weight: 94.8 kg 94.4 kg    Examination:  General:  Resting comfortably in bed HENT: NCAT OP clear PULM: Wheezing bilaterally B, normal  effort CV: RRR, no mgr GI: BS+, soft, nontender MSK: normal bulk and tone Neuro: awake, alert, no distress, MAEW  Resolved Hospital Problem list     Assessment & Plan:  Acute respiratory failure with hypoxemia in the setting of baseline mediastinal lymphadenopathy and cigarette smoking.  Broad ddx including inflammatory causes like sarcoidosis with flare, DIP/RB-ILD with flare, hypersensitivity pneumonitis, or eosinophilic pneumonia.   Infectious causes also possible though seem less likely based on presentation.  She would benefit from a bronchoscopy, but is too sick to tolerate that at this point.   Asthma at baseline, no PFT on record  Plan: -follow up serology labs sent on 2/12, sputum culture -continue solumedrol as you are doing -cigarette cessation counseling -Continue pulmicort and duoneb -wean off O2 for O2 saturation > 88% -will discuss the possibility of bronchoscopy wit her again tomorrow. -agree with doxycycline for now  Best Practice (right click and "Reselect all SmartList Selections" daily)   Per [facility]  Labs   CBC: Recent Labs  Lab 10/27/21 1449 10/27/21 1510 10/27/21 2054 10/28/21 0601 10/29/21 0358  WBC 11.5*  --   --  10.0 14.8*  NEUTROABS 7.9*  --   --   --  11.5*  HGB 12.6 12.9 13.3 12.2 12.8  HCT 40.1 38.0 39.0 37.4 39.2  MCV 85.7  --   --  83.3 83.9  PLT 277  --   --  291 973    Basic Metabolic Panel: Recent Labs  Lab 10/27/21 1449 10/27/21 1510 10/27/21 2054 10/28/21 0601 10/29/21 0358  NA 137 138 139 140 138  K 3.4* 3.2* 3.8 3.8 3.5  CL  102  --   --  104 98  CO2 24  --   --  25 29  GLUCOSE 109*  --   --  154* 129*  BUN 16  --   --  14 20  CREATININE 1.51*  --   --  0.78 0.82  CALCIUM 9.1  --   --  9.0 8.9  MG  --   --   --   --  2.1  PHOS  --   --   --   --  2.6   GFR: Estimated Creatinine Clearance: 91.8 mL/min (by C-G formula based on SCr of 0.82 mg/dL). Recent Labs  Lab 10/27/21 1449 10/28/21 0601 10/29/21 0358  WBC 11.5*  10.0 14.8*    Liver Function Tests: Recent Labs  Lab 10/29/21 0358  AST 25  ALT 19  ALKPHOS 66  BILITOT 0.4  PROT 6.8  ALBUMIN 2.9*   No results for input(s): LIPASE, AMYLASE in the last 168 hours. No results for input(s): AMMONIA in the last 168 hours.  ABG    Component Value Date/Time   PHART 7.433 10/28/2021 1342   PCO2ART 43.5 10/28/2021 1342   PO2ART 65.4 (L) 10/28/2021 1342   HCO3 28.8 (H) 10/28/2021 1342   TCO2 28 10/27/2021 2054   ACIDBASEDEF 0.2 05/03/2018 0015   O2SAT 93.5 10/28/2021 1342     Coagulation Profile: No results for input(s): INR, PROTIME in the last 168 hours.  Cardiac Enzymes: No results for input(s): CKTOTAL, CKMB, CKMBINDEX, TROPONINI in the last 168 hours.  HbA1C: No results found for: HGBA1C  CBG: No results for input(s): GLUCAP in the last 168 hours.  Critical care time: n/a     Roselie Awkward, MD [place] [facility] Pager: [phone] Cell: [phone] After 7:00 pm call Elink  [phone]

## 2021-10-29 NOTE — Progress Notes (Signed)
°  Transition of Care [facility]) Screening Note   Patient Details  Name: Joan [name] Date of Birth: 11-04-1965   Transition of Care [facility]) CM/SW Contact:    Milas Gain, [place] Phone Number: 10/29/2021, 4:40 PM    Transition of Care Department [facility]) has reviewed patient and no TOC needs have been identified at this time. We will continue to monitor patient advancement through interdisciplinary progression rounds. If new patient transition needs arise, please place a TOC consult.

## 2021-10-30 ENCOUNTER — Inpatient Hospital Stay (HOSPITAL_COMMUNITY): Payer: Medicare Other

## 2021-10-30 DIAGNOSIS — J9621 Acute and chronic respiratory failure with hypoxia: Secondary | ICD-10-CM | POA: Diagnosis not present

## 2021-10-30 LAB — CBC WITH DIFFERENTIAL/PLATELET
Abs Immature Granulocytes: 0.14 10*3/uL — ABNORMAL HIGH (ref 0.00–0.07)
Basophils Absolute: 0 10*3/uL (ref 0.0–0.1)
Basophils Relative: 0 %
Eosinophils Absolute: 0 10*3/uL (ref 0.0–0.5)
Eosinophils Relative: 0 %
HCT: 40.1 % (ref 36.0–46.0)
Hemoglobin: 12.5 g/dL (ref 12.0–15.0)
Immature Granulocytes: 1 %
Lymphocytes Relative: 28 %
Lymphs Abs: 3.4 10*3/uL (ref 0.7–4.0)
MCH: 26.8 pg (ref 26.0–34.0)
MCHC: 31.2 g/dL (ref 30.0–36.0)
MCV: 86.1 fL (ref 80.0–100.0)
Monocytes Absolute: 0.5 10*3/uL (ref 0.1–1.0)
Monocytes Relative: 5 %
Neutro Abs: 7.9 10*3/uL — ABNORMAL HIGH (ref 1.7–7.7)
Neutrophils Relative %: 66 %
Platelets: 333 10*3/uL (ref 150–400)
RBC: 4.66 MIL/uL (ref 3.87–5.11)
RDW: 14.9 % (ref 11.5–15.5)
WBC: 12 10*3/uL — ABNORMAL HIGH (ref 4.0–10.5)
nRBC: 0 % (ref 0.0–0.2)

## 2021-10-30 LAB — COMPREHENSIVE METABOLIC PANEL
ALT: 20 U/L (ref 0–44)
AST: 20 U/L (ref 15–41)
Albumin: 2.8 g/dL — ABNORMAL LOW (ref 3.5–5.0)
Alkaline Phosphatase: 61 U/L (ref 38–126)
Anion gap: 11 (ref 5–15)
BUN: 22 mg/dL — ABNORMAL HIGH (ref 6–20)
CO2: 33 mmol/L — ABNORMAL HIGH (ref 22–32)
Calcium: 9 mg/dL (ref 8.9–10.3)
Chloride: 95 mmol/L — ABNORMAL LOW (ref 98–111)
Creatinine, Ser: 0.81 mg/dL (ref 0.44–1.00)
GFR, Estimated: >60 mL/min (ref >60)
Glucose, Bld: 130 mg/dL — ABNORMAL HIGH (ref 70–99)
Potassium: 3.6 mmol/L (ref 3.5–5.1)
Sodium: 139 mmol/L (ref 135–145)
Total Bilirubin: 0.4 mg/dL (ref 0.3–1.2)
Total Protein: 6.6 g/dL (ref 6.5–8.1)

## 2021-10-30 LAB — ANCA PROFILE
Anti-MPO Antibodies: <0.2 units (ref 0.0–0.9)
Anti-PR3 Antibodies: <0.2 units (ref 0.0–0.9)
Atypical P-ANCA titer: <1:20 {titer}
C-ANCA: <1:20 {titer}
P-ANCA: <1:20 {titer}

## 2021-10-30 LAB — PREGNANCY, URINE: Preg Test, Ur: NEGATIVE

## 2021-10-30 LAB — MAGNESIUM: Magnesium: 2.1 mg/dL (ref 1.7–2.4)

## 2021-10-30 LAB — PHOSPHORUS: Phosphorus: 3.4 mg/dL (ref 2.5–4.6)

## 2021-10-30 LAB — ANA W/REFLEX IF POSITIVE: Anti Nuclear Antibody (ANA): NEGATIVE

## 2021-10-30 NOTE — Progress Notes (Signed)
NAME:  Joan [name], MRN:  [id number], DOB:  1966/02/13, LOS: 3 ADMISSION DATE:  10/27/2021, CONSULTATION DATE:  2/12 REFERRING MD:  Alfredia Ferguson, CHIEF COMPLAINT:  Abnormal CT chest   History of Present Illness:  56 y/o lady admitted on 2/11 in the setting of dyspnea, found to have acute respiratory failure with hypoxemia in the setting of bilateral pulmonary opacities and mediastinal adenopathy. PCCM consulted for further evaluation.   Pertinent  Medical History  Anemia Anxiety Chronic hypoxemic respiratory failure COPD GERD Hypertension IBS Migraine Cigarette smoker  Significant Hospital Events: Including procedures, antibiotic start and stop dates in addition to other pertinent events   CT-PA chest 2/11 >> bilateral scattered groundglass attenuation all lobes, most severe in the upper zones with some basilar sparing.  There is no traction bronchiectasis, honeycomb or subpleural reticulation.  Slightly different distribution compared with prior CTs going back to 2019.  No apparent cyst formation.  There is prominent enlarged hilar mediastinal lymphadenopathy. Rheumatoid factor, elevated 06/05/2021, also 05/05/2018 TTE 02/19/2021 >> LVEF 60-65% with normal diastolic function.  Mildly enlarged left atrium, intra-atrial color-flow suggestive of possible shunt, mildly dilated RA  Interim History / Subjective:   Feels OK today Denies dyspnea Oxygen improved Not wheezing  Objective   Blood pressure (!) 166/96, pulse 64, temperature 98.4 F (36.9 C), temperature source Oral, resp. rate 17, height _0  (1.676 m), weight 94.4 kg, SpO2 99 %.        Intake/Output Summary (Last 24 hours) at 10/30/2021 0813 Last data filed at 10/29/2021 1900 Gross per 24 hour  Intake 200 ml  Output 300 ml  Net -100 ml   Filed Weights   10/27/21 1421 10/27/21 2156  Weight: 94.8 kg 94.4 kg    Examination:  General:  Resting comfortably in bed HENT: NCAT OP clear PULM: CTA B, normal effort CV: RRR,  no mgr GI: BS+, soft, nontender MSK: normal bulk and tone Neuro: awake, alert, no distress, MAEW Psyche: awake but keeps eyes closed throughout most of the interview, slow to respond to questions but oriented, answers questions appropriately    Resolved Hospital Problem list     Assessment & Plan:  Acute respiratory failure with hypoxemia with pulmonary infiltrates and chronic mediastinal adenopathy> see prior notes for discussion of DDX.  Improved with smoking cessation, hospitalization and systemic steroids but still doesn't have a diagnosis  Plan: -NPO at midnight -EBUS, bronchoscopy for BAL/TBBx planned for 2/15 -continue pulmicort, duoneb -wean off O2 for O2 saturation > 88% -continue solumedrol, doxycycline  Best Practice (right click and "Reselect all SmartList Selections" daily)   Per [facility]  Labs   CBC: Recent Labs  Lab 10/27/21 1449 10/27/21 1510 10/27/21 2054 10/28/21 0601 10/29/21 0358 10/30/21 0339  WBC 11.5*  --   --  10.0 14.8* 12.0*  NEUTROABS 7.9*  --   --   --  11.5* 7.9*  HGB 12.6 12.9 13.3 12.2 12.8 12.5  HCT 40.1 38.0 39.0 37.4 39.2 40.1  MCV 85.7  --   --  83.3 83.9 86.1  PLT 277  --   --  291 344 409    Basic Metabolic Panel: Recent Labs  Lab 10/27/21 1449 10/27/21 1510 10/27/21 2054 10/28/21 0601 10/29/21 0358 10/30/21 0339  NA 137 138 139 140 138 139  K 3.4* 3.2* 3.8 3.8 3.5 3.6  CL 102  --   --  104 98 95*  CO2 24  --   --  25 29 33*  GLUCOSE 109*  --   --  154* 129* 130*  BUN 16  --   --  14 20 22*  CREATININE 1.51*  --   --  0.78 0.82 0.81  CALCIUM 9.1  --   --  9.0 8.9 9.0  MG  --   --   --   --  2.1 2.1  PHOS  --   --   --   --  2.6 3.4   GFR: Estimated Creatinine Clearance: 92.9 mL/min (by C-G formula based on SCr of 0.81 mg/dL). Recent Labs  Lab 10/27/21 1449 10/28/21 0601 10/29/21 0358 10/30/21 0339  WBC 11.5* 10.0 14.8* 12.0*    Liver Function Tests: Recent Labs  Lab 10/29/21 0358 10/30/21 0339  AST 25 20   ALT 19 20  ALKPHOS 66 61  BILITOT 0.4 0.4  PROT 6.8 6.6  ALBUMIN 2.9* 2.8*   No results for input(s): LIPASE, AMYLASE in the last 168 hours. No results for input(s): AMMONIA in the last 168 hours.  ABG    Component Value Date/Time   PHART 7.433 10/28/2021 1342   PCO2ART 43.5 10/28/2021 1342   PO2ART 65.4 (L) 10/28/2021 1342   HCO3 28.8 (H) 10/28/2021 1342   TCO2 28 10/27/2021 2054   ACIDBASEDEF 0.2 05/03/2018 0015   O2SAT 93.5 10/28/2021 1342     Coagulation Profile: No results for input(s): INR, PROTIME in the last 168 hours.  Cardiac Enzymes: No results for input(s): CKTOTAL, CKMB, CKMBINDEX, TROPONINI in the last 168 hours.  HbA1C: No results found for: HGBA1C  CBG: No results for input(s): GLUCAP in the last 168 hours.  Critical care time: n/a     Roselie Awkward, MD [place] [facility] Pager: [phone] Cell: [phone] After 7:00 pm call Elink  [phone]

## 2021-10-30 NOTE — Progress Notes (Signed)
Progress Note   Patient: Joan [name] YHC:[id number] DOB: 15-Sep-1966 DOA: 10/27/2021     3 DOS: the patient was seen and examined on 10/30/2021   Brief hospital course: Patient is a 56 year old obese African-American female with a past medical history significant for but not limited to asthma questionable history of ILD with possible diagnose sarcoidosis, history of chronic respiratory failure on 2 L, hypertension, GERD, chronic pain syndrome, chronic narcotic dependence, anxiety and depression, tobacco abuse with recent negative smoking who presents with increased worsening shortness of breath for the last 5 days.  She started developing wheezing and a productive cough about 1 week gradually got worse.  She uses home oxygen around-the-clock and states that she started using breathing treatments without little help.  She came to the ED and was found to be saturating in the 70s on 6 L.  CTA of the chest was negative for PE but did demonstrate chronic changes suspicious for ILD versus sarcoid versus an other etiologies.  She was given IV Solu-Medrol and admitted.  Because she continued to worsen oxygen requirement went up and she has placed on BiPAP briefly but now weaned off.  She is now on 15 L high flow nasal cannula and pulmonary has been consulted for further evaluation recommendations.  He was given some diuresis yesterday and will give another dose today.  She underwent a echocardiogram with bubble study today to evaluate for shunt.  Pulmonary recommending continuing Solu-Medrol as well as following up on serology labs as well as sputum culture.  They also recommended continue Pulmicort and DuoNebs and try and wean her off of O2.  They are discussing the possibility of bronchoscopy with her and they are recommending continuing doxycycline for now.  Assessment and Plan: * Acute on chronic respiratory failure with hypoxia (HCC) Initially suspected that she had status asthmaticus given that she had  use of accessory muscles and had diffuse wheezing even after IV Solu-Medrol treatment.  She continues to have significant wheezing so we will increase her steroids back to 120 mg daily -ABG done and she appears compensated.  She was started on BiPAP but now this has been weaned off and she is now on 15 L -Given magnesium -Continue with breathing treatments and flutter valve, incentive sponsor and guaifenesin -She is on p.o. antibiotics with doxycycline -Pulmonary has been consulted and we have initiated diuresis and given IV 60 mg yesterday and again today -Pulmonary recommending a echocardiogram with bubble study EF was 60 to 65% with no regional wall motion abnormalities with mild left ventricular hypertrophy and right ventricular diastolic parameters are normal.  There is no evidence of an intra-atrial shunt -Continue to monitor respiratory status carefully and will need to repeat a chest x-ray in a.m. -SpO2: 96 % O2 Flow Rate (L/min): 15 L/min -Continuous pulse oximetry maintain O2 saturation greater than 90%  -Continue supplemental oxygen via nasal cannula to home O2 -Patient had bilateral alveolar infiltrates superimposed on her asthma and COPD and pulmonary has been consulted and there is a very broad differential trolled diagnosis including tobacco related pneumonitis versus ILD, DIP.  They felt that cardiogenic pulmonary edema could be superimposed so they are recommending obtaining echocardiogram and continue diuresis -Pulmonary is recommending likely bronchoscopy and EBUS with transbronchial biopsies, nodal biopsies and BAL for culture data, cell count and CD4/CD8 ratio -Pulmonary sent for sputum culture for bacterial, AFB and eosinophil counts -They are ordering autoimmune labs including RF, anti-Jo1 antibody, anti-DNA antibody, CCP, SSA/B, ANCA, ANA, ACE  level, HSP panel will need to follow-up on serologies -They are recommending smoking cessation -They stopped the patient's Dulera with  the DuoNeb and Pulmicort and they feel that she may benefit to transition to a LABA, LAMA, ICS at the time of discharge -Pulm is recommending outpatient pulmonary function tests -Appreciate pulmonary evaluation recommendations   Leukocytosis -WBC went from 10.0 -> 14.8 -In the setting of steroid demargination -Continue monitor and trend and repeat CMP in a.m. and continue with doxycycline.  Anxiety and depression -Continue with duloxetine 100 g p.o. every 12 and lamotrigine 25 mg p.o. daily  AKI (acute kidney injury) ([place]) -Patient's BUN/creatinine went from 16/1.51 and is now 14/0.78 -> 20/0.82 -Received IV fluids however appeared volume overloaded and had significant wheezing so we will start diuresis and received IV Lasix 60 mg x 1 yesterday and again this afternoon -Avoid nephrotoxic medications, contrast dyes, hypotension and renally dose medications -Repeat CMP in a.m.  Narcotic dependence ([place]) -Continue with home pain regimen of oxycodone IR 15 mg 4 times daily -Continue with gabapentin, baclofen, cyclobenzaprine for her chronic pain syndrome and have put her back on her gabapentin 600 mg p.o. 4 times daily as well as baclofen 10 mg 3 times daily and cyclobenzaprine as needed  Obesity (BMI 00-86.7) -Complicates overall prognosis and care -Estimated body mass index is 33.59 kg/m as calculated from the following:   Height as of this encounter: _0  (1.676 m).   Weight as of this encounter: 94.4 kg.  -Weight Loss and Dietary Counseling given   COPD (chronic obstructive pulmonary disease) ([place])- (present on admission) -As above  Tobacco abuse- (present on admission) -Smoking cessation counseling given -Continue with nicotine patch 14 mg transdermally every 24  Subjective: Seen and examined at bedside and was little somnolent.  She is agreeable to the bronchoscopy and EBUS tomorrow.  She was not as agitated today.  Thinks her breathing is doing better.  No lightheadedness  or dizziness and she has been weaned to 4 L.  Physical Exam: Vitals:   10/30/21 0805 10/30/21 0950 10/30/21 1403 10/30/21 1407  BP:  (!) 156/79 (!) 148/79   Pulse:  63 80   Resp:  18    Temp:  98.1 F (36.7 C) 98.7 F (37.1 C)   TempSrc:  Oral Oral   SpO2: 99% 97% 93% 95%  Weight:      Height:       Examination: Physical Exam:  Constitutional: WN/WD obese African-American female currently in NAD and appears calm and comfortable Respiratory: Diminished to auscultation bilaterally with coarse breath sounds and some slight expiratory wheezing and mild rhonchi.  She has normal respiratory effort and she is wearing 4 L of supplemental oxygen via nasal cannula Cardiovascular: RRR, no murmurs / rubs / gallops. S1 and S2 auscultated.  Mild lower extremity edema Abdomen: Soft, non-tender, distended secondary to habitus. No masses palpated. No appreciable hepatosplenomegaly. Bowel sounds positive.  GU: Deferred.  Data Reviewed:  I have independently reviewed and interpreted the patient's CBC and CMP  Patient's blood glucose was 130 and patient's BUN was elevated at 22.  WBC is trending down to 12.0  Family Communication: No family present at Bedside   Disposition: Status is: Inpatient Remains inpatient appropriate because: She needs further improvement in her respiratory status and will be getting a bronchoscopy in the morning  Planned Discharge Destination: Home with Home Health  DVT Prophylaxis: Enoxaparin 40 mg sq q24h  Author: Raiford Noble, DO [facility]  10/30/2021 5:49  PM  For on call review [email or web address].

## 2021-10-31 ENCOUNTER — Encounter (HOSPITAL_COMMUNITY): Payer: Self-pay | Admitting: Internal Medicine

## 2021-10-31 ENCOUNTER — Encounter (HOSPITAL_COMMUNITY)
Admission: EM | Disposition: A | Discharge status: Home / Self Care | Payer: Self-pay | Source: Home / Self Care | Attending: Internal Medicine

## 2021-10-31 ENCOUNTER — Inpatient Hospital Stay (HOSPITAL_COMMUNITY): Payer: Medicare Other | Admitting: Anesthesiology

## 2021-10-31 ENCOUNTER — Inpatient Hospital Stay (HOSPITAL_COMMUNITY): Payer: Medicare Other

## 2021-10-31 ENCOUNTER — Telehealth: Payer: Self-pay | Admitting: Pulmonary Disease

## 2021-10-31 DIAGNOSIS — J849 Interstitial pulmonary disease, unspecified: Secondary | ICD-10-CM

## 2021-10-31 DIAGNOSIS — I10 Essential (primary) hypertension: Secondary | ICD-10-CM

## 2021-10-31 DIAGNOSIS — J449 Chronic obstructive pulmonary disease, unspecified: Secondary | ICD-10-CM

## 2021-10-31 DIAGNOSIS — J9621 Acute and chronic respiratory failure with hypoxia: Secondary | ICD-10-CM | POA: Diagnosis not present

## 2021-10-31 DIAGNOSIS — K219 Gastro-esophageal reflux disease without esophagitis: Secondary | ICD-10-CM

## 2021-10-31 HISTORY — PX: BIOPSY: SHX5522

## 2021-10-31 HISTORY — PX: BRONCHIAL WASHINGS: SHX5105

## 2021-10-31 HISTORY — PX: VIDEO BRONCHOSCOPY WITH ENDOBRONCHIAL ULTRASOUND: SHX6177

## 2021-10-31 HISTORY — PX: BRONCHIAL NEEDLE ASPIRATION BIOPSY: SHX5106

## 2021-10-31 LAB — COMPREHENSIVE METABOLIC PANEL
ALT: 56 U/L — ABNORMAL HIGH (ref 0–44)
AST: 48 U/L — ABNORMAL HIGH (ref 15–41)
Albumin: 2.7 g/dL — ABNORMAL LOW (ref 3.5–5.0)
Alkaline Phosphatase: 55 U/L (ref 38–126)
Anion gap: 8 (ref 5–15)
BUN: 20 mg/dL (ref 6–20)
CO2: 33 mmol/L — ABNORMAL HIGH (ref 22–32)
Calcium: 9.1 mg/dL (ref 8.9–10.3)
Chloride: 98 mmol/L (ref 98–111)
Creatinine, Ser: 0.64 mg/dL (ref 0.44–1.00)
GFR, Estimated: >60 mL/min (ref >60)
Glucose, Bld: 139 mg/dL — ABNORMAL HIGH (ref 70–99)
Potassium: 3.7 mmol/L (ref 3.5–5.1)
Sodium: 139 mmol/L (ref 135–145)
Total Bilirubin: 0.4 mg/dL (ref 0.3–1.2)
Total Protein: 6.1 g/dL — ABNORMAL LOW (ref 6.5–8.1)

## 2021-10-31 LAB — CBC WITH DIFFERENTIAL/PLATELET
Abs Immature Granulocytes: 0.13 10*3/uL — ABNORMAL HIGH (ref 0.00–0.07)
Basophils Absolute: 0 10*3/uL (ref 0.0–0.1)
Basophils Relative: 0 %
Eosinophils Absolute: 0 10*3/uL (ref 0.0–0.5)
Eosinophils Relative: 0 %
HCT: 41.1 % (ref 36.0–46.0)
Hemoglobin: 13.3 g/dL (ref 12.0–15.0)
Immature Granulocytes: 1 %
Lymphocytes Relative: 31 %
Lymphs Abs: 3.1 10*3/uL (ref 0.7–4.0)
MCH: 27.1 pg (ref 26.0–34.0)
MCHC: 32.4 g/dL (ref 30.0–36.0)
MCV: 83.7 fL (ref 80.0–100.0)
Monocytes Absolute: 0.5 10*3/uL (ref 0.1–1.0)
Monocytes Relative: 5 %
Neutro Abs: 6.2 10*3/uL (ref 1.7–7.7)
Neutrophils Relative %: 63 %
Platelets: 339 10*3/uL (ref 150–400)
RBC: 4.91 MIL/uL (ref 3.87–5.11)
RDW: 14.5 % (ref 11.5–15.5)
WBC: 10 10*3/uL (ref 4.0–10.5)
nRBC: 0 % (ref 0.0–0.2)

## 2021-10-31 LAB — HYPERSENSITIVITY PNEUMONITIS
A. Pullulans Abs: NEGATIVE
A.Fumigatus #1 Abs: NEGATIVE
Micropolyspora faeni, IgG: NEGATIVE
Pigeon Serum Abs: NEGATIVE
Thermoact. Saccharii: NEGATIVE
Thermoactinomyces vulgaris, IgG: NEGATIVE

## 2021-10-31 LAB — PHOSPHORUS: Phosphorus: 3.6 mg/dL (ref 2.5–4.6)

## 2021-10-31 LAB — MAGNESIUM: Magnesium: 2.2 mg/dL (ref 1.7–2.4)

## 2021-10-31 SURGERY — BRONCHOSCOPY, WITH EBUS
Anesthesia: General

## 2021-10-31 MED ORDER — AMISULPRIDE (ANTIEMETIC) 5 MG/2ML IV SOLN: 10.0000 mg | Freq: Once | INTRAVENOUS | Status: DC | PRN

## 2021-10-31 MED ORDER — PREDNISONE 10 MG PO TABS: 30.0000 mg | ORAL_TABLET | Freq: Every day | ORAL | 0 refills | Status: AC

## 2021-10-31 MED ORDER — FENTANYL CITRATE (PF) 100 MCG/2ML IJ SOLN
INTRAMUSCULAR | Status: AC
  Filled 2021-10-31: qty 2

## 2021-10-31 MED ORDER — ONDANSETRON HCL 4 MG/2ML IJ SOLN
INTRAMUSCULAR | Status: DC | PRN
  Administered 2021-10-31: 4 mg via INTRAVENOUS

## 2021-10-31 MED ORDER — OXYCODONE HCL 5 MG PO TABS: 5.0000 mg | ORAL_TABLET | Freq: Once | ORAL | Status: DC | PRN

## 2021-10-31 MED ORDER — PROMETHAZINE HCL 25 MG/ML IJ SOLN: 6.2500 mg | INTRAMUSCULAR | Status: DC | PRN

## 2021-10-31 MED ORDER — PANTOPRAZOLE SODIUM 40 MG PO TBEC: 40.0000 mg | DELAYED_RELEASE_TABLET | Freq: Every day | ORAL | 2 refills | Status: DC

## 2021-10-31 MED ORDER — FENTANYL CITRATE (PF) 100 MCG/2ML IJ SOLN: 25.0000 ug | INTRAMUSCULAR | Status: DC | PRN

## 2021-10-31 MED ORDER — DOXYCYCLINE HYCLATE 100 MG PO TABS
100.0000 mg | ORAL_TABLET | Freq: Two times a day (BID) | ORAL | 0 refills | Status: AC
Start: 2021-10-31 — End: 2021-11-02

## 2021-10-31 MED ORDER — OXYCODONE HCL 5 MG/5ML PO SOLN: 5.0000 mg | Freq: Once | ORAL | Status: DC | PRN

## 2021-10-31 MED ORDER — LIDOCAINE 2% (20 MG/ML) 5 ML SYRINGE
INTRAMUSCULAR | Status: DC | PRN
  Administered 2021-10-31: 100 mg via INTRAVENOUS

## 2021-10-31 MED ORDER — CHLORHEXIDINE GLUCONATE 0.12 % MT SOLN
OROMUCOSAL | Status: AC
  Filled 2021-10-31: qty 15

## 2021-10-31 MED ORDER — MIDAZOLAM HCL 2 MG/2ML IJ SOLN
INTRAMUSCULAR | Status: DC | PRN
  Administered 2021-10-31: 2 mg via INTRAVENOUS

## 2021-10-31 MED ORDER — IPRATROPIUM-ALBUTEROL 0.5-2.5 (3) MG/3ML IN SOLN: 3.0000 mL | Freq: Two times a day (BID) | RESPIRATORY_TRACT | Status: DC

## 2021-10-31 MED ORDER — MIDAZOLAM HCL 2 MG/2ML IJ SOLN
INTRAMUSCULAR | Status: AC
  Filled 2021-10-31: qty 2

## 2021-10-31 MED ORDER — PROPOFOL 10 MG/ML IV BOLUS
INTRAVENOUS | Status: DC | PRN
Start: 2021-10-31 — End: 2021-10-31
  Administered 2021-10-31: 140 mg via INTRAVENOUS

## 2021-10-31 MED ORDER — SUGAMMADEX SODIUM 200 MG/2ML IV SOLN
INTRAVENOUS | Status: DC | PRN
  Administered 2021-10-31: 200 mg via INTRAVENOUS

## 2021-10-31 MED ORDER — DEXAMETHASONE SODIUM PHOSPHATE 10 MG/ML IJ SOLN
INTRAMUSCULAR | Status: DC | PRN
  Administered 2021-10-31: 10 mg via INTRAVENOUS

## 2021-10-31 MED ORDER — ROCURONIUM BROMIDE 10 MG/ML (PF) SYRINGE
PREFILLED_SYRINGE | INTRAVENOUS | Status: DC | PRN
  Administered 2021-10-31: 50 mg via INTRAVENOUS
  Administered 2021-10-31: 30 mg via INTRAVENOUS

## 2021-10-31 MED ORDER — CHLORHEXIDINE GLUCONATE 0.12 % MT SOLN: 15.0000 mL | Freq: Once | OROMUCOSAL | Status: DC

## 2021-10-31 MED ORDER — ALBUTEROL SULFATE HFA 108 (90 BASE) MCG/ACT IN AERS
INHALATION_SPRAY | RESPIRATORY_TRACT | Status: DC | PRN
  Administered 2021-10-31 (×2): 3 via RESPIRATORY_TRACT

## 2021-10-31 MED ORDER — LACTATED RINGERS IV SOLN: INTRAVENOUS | Status: DC

## 2021-10-31 MED ORDER — FENTANYL CITRATE (PF) 100 MCG/2ML IJ SOLN
INTRAMUSCULAR | Status: DC | PRN
  Administered 2021-10-31: 100 ug via INTRAVENOUS

## 2021-10-31 NOTE — Op Note (Signed)
[name] Cardiopulmonary Patient Name: Joan [name] Date: 10/31/2021 MRN: [id number] Attending MD: Juanito Doom , MD Date of Birth: 11-Jun-1966 CSN: Finalized Age: 56 Admit Type: Inpatient Gender: Female Procedure:             Bronchoscopy Indications:           Interstitial lung disease, Mediastinal adenopathy Providers:             Nathaneil Canary B. Lake Bells, MD, Kary Kos RN, RN, Luan Moore, Technician, Mora Appl, CRNA Referring MD:           Medicines:             General Anesthesia Complications:         No immediate complications Estimated Blood Loss:  Estimated blood loss was minimal. Procedure:             Pre-Anesthesia Assessment:                        - A History and Physical has been performed. Patient                         meds and allergies have been reviewed. The risks and                         benefits of the procedure and the sedation options and                         risks were discussed with the patient. All questions                         were answered and informed consent was obtained.                         Patient identification and proposed procedure were                         verified prior to the procedure by the physician and                         the nurse in the pre-procedure area. Mental Status                         Examination: normal. Airway Examination: normal                         oropharyngeal airway. Respiratory Examination: clear                         to auscultation. CV Examination: normal. ASA Grade                         Assessment: II - A patient with mild systemic disease.                         After reviewing the risks and benefits, the patient  was deemed in satisfactory condition to undergo the                         procedure. The anesthesia plan was to use no sedation                         or anesthesia. Immediately prior to  administration of                         medications, the patient was re-assessed for adequacy                         to receive sedatives. The heart rate, respiratory                         rate, oxygen saturations, blood pressure, adequacy of                         pulmonary ventilation, and response to care were                         monitored throughout the procedure. The physical                         status of the patient was re-assessed after the                         procedure.                        After obtaining informed consent, the bronchoscope was                         passed under direct vision. Throughout the procedure,                         the patient's blood pressure, pulse, and oxygen                         saturations were monitored continuously. the BF-[id number]                         ([id number]) Olympus broncoscope was introduced through                         the mouth, via the endotracheal tube and advanced to                         the tracheobronchial tree of both lungs. the BF-[id number]                         ([id number]) Olympus Ebus scope was introduced through                         the and advanced to the. The procedure was                         accomplished without difficulty. The patient tolerated  the procedure well. The total duration of the                         procedure was 44 minutes. Scope In: Scope Out: Findings:      The endotracheal tube is in good position. The visualized portion of the       trachea is of normal caliber. The carina is sharp. The tracheobronchial       tree was examined to at least the first subsegmental level. Bronchial       mucosa and anatomy are normal; there are no endobronchial lesions, and       no secretions.      Bronchoalveolar lavage was performed in the right middle lobe of the       lung and sent for cell count, bacterial culture, viral smears & culture,       and fungal & AFB  analysis and bacterial, AFB and fungal analysis. 60 mL       of fluid were instilled. 17 mL were returned. The return was cloudy.       There were no mucoid plugs in the return fluid.      Transbronchial biopsies of an area of infiltration were performed in the       anterior basal segment of the right lower lobe and in the lateral basal       segment of the right lower lobe using forceps and sent for       histopathology examination. The procedure was guided by fluoroscopy.       Transbronchial biopsy technique was selected because the sampling site       was not visible endoscopically. Five biopsy passes were performed. Five       biopsy samples were obtained.      An endobronchial ultrasound endoscope was utilized in order to assist       with fine needle aspiration in the left and right paratracheal and       subcarinal areas. Enlarged lymph nodes were visible at station 7 and 4R,       with station 7 being the most easily accessible via EBUS.      Transbronchial needle aspirations of a station 7 lymph node were       performed in the subcarinal area using a [employer] 22       gauge needle and sent for routine cytology. The procedure was guided by       EBUS. Needle aspiration of lung tissue was obtained. Five samples were       obtained. Impression:            - Interstitial lung disease                        - Mediastinal adenopathy                        - The airway examination was normal.                        - Bronchoalveolar lavage was performed.                        - Transbronchial lung biopsies were performed.                        -  A transbronchial needle aspiration was performed.                        - Endobronchial ultrasound was performed. Moderate Sedation:      General Anesthesia Recommendation:        - Await BAL, biopsy and cytology results. Procedure Code(s):     --- Professional ---                        [phone], Bronchoscopy, rigid or  flexible, including                         fluoroscopic guidance, when performed; with                         transbronchial needle aspiration biopsy(s), trachea,                         main stem and/or lobar bronchus(i)                        31628, Bronchoscopy, rigid or flexible, including                         fluoroscopic guidance, when performed; with                         transbronchial lung biopsy(s), single lobe                        89211, Bronchoscopy, rigid or flexible, including                         fluoroscopic guidance, when performed; with bronchial                         alveolar lavage                        31654, Bronchoscopy, rigid or flexible, including                         fluoroscopic guidance, when performed; with                         transendoscopic endobronchial ultrasound (EBUS) during                         bronchoscopic diagnostic or therapeutic                         intervention(s) for peripheral lesion(s) (List                         separately in addition to code for primary                         procedure[s]) Diagnosis Code(s):     --- Professional ---                        R59.0, Localized enlarged lymph nodes  J84.9, Interstitial pulmonary disease, unspecified CPT copyright 2019 American Medical Association. All rights reserved. The codes documented in this report are preliminary and upon coder review may  be revised to meet current compliance requirements. Norlene Campbell, MD Juanito Doom, MD 10/31/2021 11:59:35 AM This report has been signed electronically. Number of Addenda: 0

## 2021-10-31 NOTE — Anesthesia Preprocedure Evaluation (Addendum)
Anesthesia Evaluation  Patient identified by MRN, date of birth, ID band Patient awake    Reviewed: Allergy & Precautions, NPO status , Patient's Chart, lab work & pertinent test results  Airway Mallampati: III  TM Distance: >3 FB Neck ROM: Full    Dental  (+) Dental Advisory Given, Chipped, Poor Dentition   Pulmonary asthma , COPD (2LPM [place] around the clock),  COPD inhaler and oxygen dependent, Current Smoker and Patient abstained from smoking.,  Mediastinal adenopathy Current smoker, 26 pack year history  On 4LPM  all yesterday from 15L HFNC the day prior- now being trialed off O2, 93-94% on RA questionable history of ILD with possible diagnose sarcoidosis, history of chronic respiratory failure and was prescribed oxygen but never received it  Presented with increasing SOB over past 5d  94% on RA in preop   + decreased breath sounds      Cardiovascular hypertension, Pt. on medications Normal cardiovascular exam Rhythm:Regular Rate:Normal  Echo 2022: 1. Left ventricular ejection fraction, by estimation, is 60 to 65%. The  left ventricle has normal function. The left ventricle has no regional  wall motion abnormalities. Left ventricular diastolic parameters were  normal.  2. Right ventricular systolic function is normal. The right ventricular  size is normal.  3. Left atrial size was mildly dilated.  4. In image 82, there is significant color flow near the intraatrial  septum, accentuated with respiration. This is likely venous inflow into  the RA, but as septum position not well seen, cannot exclude intra-atrial  shunting.  5. Right atrial size was mildly dilated.  6. The mitral valve is normal in structure. Trivial mitral valve  regurgitation. No evidence of mitral stenosis.  7. The aortic valve is grossly normal. There is mild calcification of the  aortic valve. There is mild thickening of the aortic valve. Aortic  valve  regurgitation is not visualized. Mild aortic valve sclerosis is present,  with no evidence of aortic valve  stenosis.  8. The inferior vena cava is normal in size with greater than 50%  respiratory variability, suggesting right atrial pressure of 3 mmHg.    Neuro/Psych  Headaches, PSYCHIATRIC DISORDERS Anxiety Depression  Neuromuscular disease (LUE ulnar neuropathy)    GI/Hepatic Neg liver ROS, GERD  Medicated and Controlled,  Endo/Other  BMI 34  Renal/GU   negative genitourinary   Musculoskeletal CRPS Chronic LBP   Abdominal (+) + obese,   Peds negative pediatric ROS (+)  Hematology negative hematology ROS (+) hct 41.1   Anesthesia Other Findings   Reproductive/Obstetrics negative OB ROS                           Anesthesia Physical Anesthesia Plan  ASA: 3  Anesthesia Plan: General   Post-op Pain Management:    Induction: Intravenous  PONV Risk Score and Plan: 2 and Ondansetron, Dexamethasone, Treatment may vary due to age or medical condition and Midazolam  Airway Management Planned: Oral ETT  Additional Equipment: None  Intra-op Plan:   Post-operative Plan: Extubation in OR  Informed Consent: I have reviewed the patients History and Physical, chart, labs and discussed the procedure including the risks, benefits and alternatives for the proposed anesthesia with the patient or authorized representative who has indicated his/her understanding and acceptance.     Dental advisory given  Plan Discussed with: CRNA  Anesthesia Plan Comments: (ETT placed at [facility] 2013: 08/28/12; 0734; Placed in [place]; Blade type: Miller; Blade size:  3; Laryngoscopic view grade: 1; Bougie used? Yes; Airway size (mm): 7.0 OT tube; Cuffed; Initial leak: Yes; Insertion attempts: 1; Cricoid pressure: Yes; Placement verification: Bilateral Breath Sounds, ETCO2, Sounds absent over stomach, Chest Rise; Secured at 21 cm; Measured from Lips; Complex? No; Mask  airway: Easy)       Anesthesia Quick Evaluation

## 2021-10-31 NOTE — Transfer of Care (Signed)
Immediate Anesthesia Transfer of Care Note  Patient: Joan [name]  Procedure(s) Performed: VIDEO BRONCHOSCOPY WITH ENDOBRONCHIAL ULTRASOUND BRONCHIAL WASHINGS BIOPSY BRONCHIAL NEEDLE ASPIRATION BIOPSIES  Patient Location: PACU  Anesthesia Type:General  Level of Consciousness: drowsy, patient cooperative and responds to stimulation  Airway & Oxygen Therapy: Patient Spontanous Breathing  Post-op Assessment: Report given to RN and Post -op Vital signs reviewed and stable  Post vital signs: Reviewed and stable  Last Vitals:  Vitals Value Taken Time  BP 153/92 10/31/21 1202  Temp    Pulse 107 10/31/21 1203  Resp 15 10/31/21 1203  SpO2 94 % 10/31/21 1203  Vitals shown include unvalidated device data.  Last Pain:  Vitals:   10/31/21 0715  TempSrc:   PainSc: Asleep      Patients Stated Pain Goal: 0 (03/52/48 1859)  Complications: No notable events documented.

## 2021-10-31 NOTE — Progress Notes (Signed)
[facility]  Video Bronchoscopy performed: transbronchial biopsy, BAL, and FNA of station 7 lymphadenopathy  No preliminary pathologic findings on lymph tissue extracted by FNA.  From pulmonary standpoint OK to d/c home after CXR  To follow up in pulmonary clinic, have contacted office to arrange.  Would d/c home on 52m prednisone daily until seen in clinic.  BRoselie Awkward MD [place] [facility] Pager: [phone]: [phone] 7:00 pm call Elink  [phone]

## 2021-10-31 NOTE — Assessment & Plan Note (Signed)
Continue home albuterol nebs as needed.

## 2021-10-31 NOTE — H&P (Signed)
[facility]   CC: dyspena HPI 56 y/o smoker admitted with severe acute respiratory failure with hypoxemia of uncertain etiology.  Infitrates have improved over several days of hospitalization with antibiotics and steroids.  She has mediastinal adenopathy noted on chest imaging, this has been seen previously as well.  Planning for a diagnostic bronchoscopy and EBUS today.  She confirms she is NPO  Past Medical History:  Diagnosis Date   Anemia    Anxiety    Asthma    Chronic back pain    Chronic neck pain    Complex regional pain syndrome    COPD (chronic obstructive pulmonary disease) (HCC)    GERD (gastroesophageal reflux disease)    Hematuria 09/07/2013   Hypertension    IBS (irritable bowel syndrome)    Migraine    Paresthesia of left arm      Family History  Problem Relation Age of Onset   Diabetes Sister    Hypertension Sister    Heart disease Sister    Cancer Sister        brain tumor   Heart disease Brother    Diabetes Brother    Cancer Maternal Aunt        uterine   Cancer Maternal Grandmother        uterine   Other Mother        car accident     Social History   Socioeconomic History   Marital status: Married    Spouse name: Mitzi Hansen   Number of children: 4   Years of education: College   Highest education level: Not on file  Occupational History    Employer: [employer]    Comment: n/a  Tobacco Use   Smoking status: Every Day    Packs/day: 1.00    Years: 26.00    Pack years: 26.00    Types: Cigarettes   Smokeless tobacco: Never   Tobacco comments:    04/23/16 1/2- 3/4 PPD  Substance and Sexual Activity   Alcohol use: No   Drug use: No   Sexual activity: Yes    Birth control/protection: None, Surgical    Comment: not at this time  Other Topics Concern   Not on file  Social History Narrative   Patient lives at home with family.   Caffeine Use: 6-7 cups daily   Social Determinants of Health   Financial Resource Strain: Not on file   Food Insecurity: Not on file  Transportation Needs: Not on file  Physical Activity: Not on file  Stress: Not on file  Social Connections: Not on file  Intimate Partner Violence: Not on file     Allergies  Allergen Reactions   Gabapentin Other (See Comments) and Hypertension    Elevated BP requiring doubling of current meds   Other Other (See Comments)    Unnamed IV meds for migraine, patient stated it made her feel like "something was crawling all over" her body     _0 @ Vitals:   10/30/21 1407 10/30/21 2020 10/31/21 0535 10/31/21 0726  BP:  (!) 158/35 (!) 154/77   Pulse:  85 65 78  Resp:  17  16  Temp:  98.7 F (37.1 C) 97.8 F (36.6 C)   TempSrc:  Oral Oral   SpO2: 95% 96%  94%  Weight:      Height:       General:  Resting comfortably in bed HENT: NCAT OP clear PULM: CTA B, normal effort CV: RRR, no mgr GI:  BS+, soft, nontender MSK: normal bulk and tone Neuro: awake, alert, no distress, MAEW   CBC    Component Value Date/Time   WBC 10.0 10/31/2021 0326   RBC 4.91 10/31/2021 0326   HGB 13.3 10/31/2021 0326   HCT 41.1 10/31/2021 0326   PLT 339 10/31/2021 0326   MCV 83.7 10/31/2021 0326   MCH 27.1 10/31/2021 0326   MCHC 32.4 10/31/2021 0326   RDW 14.5 10/31/2021 0326   LYMPHSABS 3.1 10/31/2021 0326   MONOABS 0.5 10/31/2021 0326   EOSABS 0.0 10/31/2021 0326   BASOSABS 0.0 10/31/2021 0326   Not on blood thinners  CT chest> bilateral pulmonary infiltrates, mediastinal adenopathy  Impression Acute hypoxemic respiratory failure Mediastinal adenopathy Acute inflammatory pneumonitis Interstitial lung disease Cigarette smoker  Plan: EBUS Bronchsocopy with BAL/TBBX  Roselie Awkward, MD [place] [facility] Pager: [phone] Cell: [phone] After 7:00 pm call Elink  [phone]

## 2021-10-31 NOTE — Telephone Encounter (Signed)
Please arrange hospital follow up with any provider in 1-2 weeks to follow up bronchoscopy and EBUS.  Concern for sarcoidosis.

## 2021-10-31 NOTE — Progress Notes (Signed)
Pt demanding that she wants to leave and wants to take a shower. Spoke with Dr. [place] and he is working on discharge paperwork. Pt has taken her heart monitor off and will not wear it.

## 2021-10-31 NOTE — Discharge Summary (Signed)
Physician Discharge Summary   Patient: Joan [name] MRN: [id number] DOB: Feb 20, 1966  Admit date:     10/27/2021  Discharge date: 10/31/21  Discharge Physician: Jonhatan Hearty J [place]   PCP: Nolene Ebbs, MD   Recommendations at discharge:   Follow-up with PCP in 7-10 days following hospitalization Follow-up with pulmonology in 1-2 weeks for results of bronchoscopy, EBUS with biopsy Continue prednisone 30 mg p.o. daily until follows up with pulmonology for further recommendations Continue to encourage tobacco cessation Follow-up lung biopsy results that were pending at time of discharge  Discharge Diagnoses: Principal Problem:   Acute on chronic respiratory failure with hypoxia ([place]) Active Problems:   AKI (acute kidney injury) ([place])   Leukocytosis   Asthma, chronic, unspecified asthma severity, with acute exacerbation   COPD (chronic obstructive pulmonary disease) ([place])   Anxiety and depression   Narcotic dependence ([place])   Tobacco abuse   Obesity (BMI 30-39.9)   Benign essential HTN  Resolved Problems:   * No resolved hospital problems. [facility] Course: Joan [name] is a 56 year old [name] with past medical history significant for asthma, chronic respiratory failure on 2 L nasal cannula, essential hypertension, GERD, chronic pain syndrome with narcotic dependence, anxiety/depression, tobacco abuse, who presented to [facility] ED on 2/11 with progressive shortness of breath.  Onset 5 days prior with associated wheezing, productive cough.  Patient reports home breathing treatments/nebs did not help symptoms.  Denies any recent sick contacts, no home medication changes.  In the ED, patient was noted to be saturating in the 70s on 6 L nasal cannula.  CT angiogram chest negative for pulmonary embolism but did demonstrate chronic changes concerning for ILD versus sarcoid.  Patient was started on neb treatments, IV steroids.  Hospitalist service consulted for further evaluation and  management of acute on chronic hypoxic respiratory failure concerning for ILD versus sarcoidosis.  Assessment and Plan: * Acute on chronic respiratory failure with hypoxia ([place])- (present on admission) Patient presenting to the ED with progressive shortness of breath, oxygen dependent at baseline with 2 L nasal cannula.  On presentation the ED was noted to have significant hypoxia with SPO2 70s on 6 L nasal cannula.  CT angiogram chest negative for pulmonary embolism but with signs concerning for ILD versus sarcoidosis.  Patient was started on neb treatments, IV Solu-Medrol.  ANA, ANCA, CCP, anti-DNA antibody, anti-Jo antibody, antiscleroderma all negative.  Rheumatoid factor slightly elevated 18.8.  Pulmonology was consulted and patient underwent bronchoscopy/EBUS with biopsy.  Patient's oxygen was slowly weaned back to her typical baseline.  We will continue doxycycline 100 mg p.o. twice daily to complete 7-day course.  We will continue prednisone 30 mg p.o. daily until outpatient follow-up with pulmonology in 1-2 weeks.  Continue to encourage tobacco cessation.   AKI (acute kidney injury) ([place])- (present on admission) Creatinine elevated 1.5 on admission, received IV diuresis with improvement of creatinine to 0.64 at time of discharge.  Recommend repeat BMP 1 week.  Leukocytosis Etiology likely secondary to steroid effect/de-marginalization.   Asthma, chronic, unspecified asthma severity, with acute exacerbation- (present on admission) Continue home albuterol nebs as needed.  COPD (chronic obstructive pulmonary disease) ([place])- (present on admission) Continue to encourage tobacco cessation.  Continue albuterol neb/MDI as needed.  Not on long-term controller inhalers outpatient.  Outpatient follow-up with pulmonology.  Anxiety and depression- (present on admission) Continue home Duloxetine 60 g p.o. every 12 hours, Depakote 500 mg p.o. daily, and Xanax 1 mg p.o. 3 times daily as  needed.  Narcotic dependence ([place])- (present on admission) Continue home pain regimen of oxycodone IR 15 mg 4 times daily, gabapentin, baclofen, cyclobenzaprine.  Outpatient follow-up with PCP.  Tobacco abuse- (present on admission) Continue to encourage tobacco cessation.  Obesity (BMI 30-39.9) Body mass index is 33.59 kg/m. Discussed with patient needs for aggressive lifestyle changes/weight loss as this complicates all facets of care.  Outpatient follow-up with PCP.   Benign essential HTN- (present on admission) Continue home HCTZ 50 mg p.o. daily, lisinopril 20 mg p.o. daily           Consultants: Pulmonology Procedures performed: Bronchoscopy/EBUS with biopsy Disposition: Home Diet recommendation:  Discharge Diet Orders (From admission, onward)     Start     Ordered   10/31/21 0000  Diet - low sodium heart healthy        10/31/21 1302           Cardiac and Carb modified diet  DISCHARGE MEDICATION: Allergies as of 10/31/2021       Reactions   Gabapentin Other (See Comments), Hypertension   Elevated BP requiring doubling of current meds   Other Other (See Comments)   Unnamed IV meds for migraine, patient stated it made her feel like "something was crawling all over" her body        Medication List     STOP taking these medications    Cholecalciferol 125 MCG (5000 UT) Tabs   cyclobenzaprine 5 MG tablet Commonly known as: FLEXERIL   lamoTRIgine 25 MG tablet Commonly known as: LAMICTAL   naloxone 4 MG/0.1ML Liqd nasal spray kit Commonly known as: NARCAN   nicotine 14 mg/24hr patch Commonly known as: NICODERM CQ - dosed in mg/24 hours   sulfamethoxazole-trimethoprim 800-160 MG tablet Commonly known as: BACTRIM DS   Symbicort 160-4.5 MCG/ACT inhaler Generic drug: budesonide-formoterol       TAKE these medications    albuterol 108 (90 Base) MCG/ACT inhaler Commonly known as: VENTOLIN HFA Inhale 2 puffs into the lungs every 4 (four)  hours as needed for wheezing or shortness of breath.   albuterol (2.5 MG/3ML) 0.083% nebulizer solution Commonly known as: PROVENTIL Take 3 mLs (2.5 mg total) by nebulization every 6 (six) hours as needed for wheezing or shortness of breath.   ALPRAZolam 1 MG tablet Commonly known as: XANAX Take 1 mg by mouth 3 (three) times daily.   baclofen 10 MG tablet Commonly known as: LIORESAL Take 10 mg by mouth in the morning, at noon, and at bedtime. Morning, afternon, and   divalproex 500 MG DR tablet Commonly known as: DEPAKOTE Take 1 tablet (500 mg total) by mouth daily.   doxycycline 100 MG tablet Commonly known as: VIBRA-TABS Take 1 tablet (100 mg total) by mouth every 12 (twelve) hours for 2 days.   DULoxetine 60 MG capsule Commonly known as: CYMBALTA Take 1 capsule (60 mg total) by mouth in the morning and at bedtime.   gabapentin 300 MG capsule Commonly known as: NEURONTIN Take 600 mg by mouth 4 (four) times daily.   hydrochlorothiazide 50 MG tablet Commonly known as: HYDRODIURIL Take 1 tablet (50 mg total) by mouth daily.   lisinopril 20 MG tablet Commonly known as: ZESTRIL Take 1 tablet (20 mg total) by mouth daily.   oxyCODONE 15 MG immediate release tablet Commonly known as: ROXICODONE Take 15 mg by mouth 4 (four) times daily.   pantoprazole 40 MG tablet Commonly known as: PROTONIX Take 1 tablet (40 mg total) by mouth daily.  What changed: when to take this   predniSONE 10 MG tablet Commonly known as: DELTASONE Take 3 tablets (30 mg total) by mouth daily. What changed: when to take this   topiramate 25 MG tablet Commonly known as: TOPAMAX Take 25 mg by mouth daily as needed (for more than 2 seizures a day).        Follow-up Information     Nolene Ebbs, MD. Schedule an appointment as soon as possible for a visit in 1 week(s).   Specialty: Internal Medicine Contact information: [address] [place] [address] [phone]          [facility]. Schedule an appointment as soon as possible for a visit in 1 week(s).   Specialty: Pulmonology Contact information: [place] [address] [phone]               Subjective: Patient seen examined bedside, resting comfortably.  Completed bronchoscopy/EBUS with biopsy.  Ready for discharge home.  Okay per pulmonology for discharge home on prednisone 30 mg p.o. daily with outpatient follow-up planned in 1-2 weeks.  Patient with no other complaints or concerns at this time.  Denies headache, no fever/chills/night sweats, no nausea/vomiting/diarrhea, no chest pain, no shortness of breath greater than her typical baseline, no palpitations, no abdominal pain, no weakness, no fatigue, no paresthesias.  No acute events overnight per nurse staff.   Discharge Exam: Filed Weights   10/27/21 1421 10/27/21 2156  Weight: 94.8 kg 94.4 kg   Physical Exam GEN: 56 yo [name] in NAD, alert and oriented x 3, wd/wn HEENT: NCAT, PERRL, EOMI, sclera clear, MMM PULM: CTAB w/o wheezes/crackles, normal respiratory effort, on 2 L nasal cannula which is her baseline CV: RRR w/o M/G/R GI: abd soft, NTND, NABS, no R/G/M MSK: no peripheral edema, muscle strength globally intact 5/5 bilateral upper/lower extremities NEURO: CN II-XII intact, no focal deficits, sensation to light touch intact PSYCH: normal mood/affect Integumentary: dry/intact, no rashes or wounds   Condition at discharge: stable  The results of significant diagnostics from this hospitalization (including imaging, microbiology, ancillary and laboratory) are listed below for reference.   Imaging Studies: DG Chest 2 View  Result Date: 10/30/2021 CLINICAL DATA:  Hypoxemia. EXAM: CHEST - 2 VIEW COMPARISON:  Chest radiograph and CTA 10/27/2021 FINDINGS: The cardiomediastinal silhouette is unchanged with normal heart size. Bilateral interstitial densities are stable to slightly  improved compared to the prior radiograph and better characterized on the interval CT. No pleural effusion or pneumothorax is identified. No acute osseous abnormality is seen. IMPRESSION: Stable to slightly improved bilateral interstitial densities. Electronically Signed   By: Logan Bores M.D.   On: 10/30/2021 07:46   CT Angio Chest PE W and/or Wo Contrast  Result Date: 10/27/2021 CLINICAL DATA:  Pulmonary embolism (PE) suspected, high prob. eval of intermittent sob and cp x several days with associated cough. Hx COPD and has recently quit smoking cold [place] EXAM: CT ANGIOGRAPHY CHEST WITH CONTRAST TECHNIQUE: Multidetector CT imaging of the chest was performed using the standard protocol during bolus administration of intravenous contrast. Multiplanar CT image reconstructions and MIPs were obtained to evaluate the vascular anatomy. RADIATION DOSE REDUCTION: This exam was performed according to the departmental dose-optimization program which includes automated exposure control, adjustment of the mA and/or kV according to patient size and/or use of iterative reconstruction technique. CONTRAST:  32m OMNIPAQUE IOHEXOL 350 MG/ML SOLN COMPARISON:  CT chest 06/04/2021, CT angiography chest 05/03/2018, CT angiography chest 03/05/2018  FINDINGS: Cardiovascular: Satisfactory opacification of the pulmonary arteries to the segmental level. No evidence of pulmonary embolism. Normal heart size. No significant pericardial effusion. The thoracic aorta is normal in caliber. Mild atherosclerotic plaque of the thoracic aorta. No coronary artery calcifications. Mediastinum/Nodes: Persistent enlarged bilateral hilar, right greater than left, as well as mediastinal lymphadenopathy. As an example a 1.6 cm right hilar lymph node (7:173) as an example a right paratracheal lymph node measuring 1.6 cm (7:132). No enlarged axillary lymph nodes. Thyroid gland, trachea, and esophagus demonstrate no significant findings. Small hiatal  hernia. Lungs/Pleura: Diffuse peribronchovascular ground-glass airspace opacities with interlobular septal wall thickening. Limited evaluation for pulmonary nodule. No pulmonary mass. No pleural effusion. No pneumothorax. Upper Abdomen: No acute abnormality. Musculoskeletal: No chest wall abnormality. No suspicious lytic or blastic osseous lesions. No acute displaced fracture. Review of the MIP images confirms the above findings. IMPRESSION: 1. No pulmonary embolus. 2. Chronic diffuse peribronchovascular ground-glass airspace opacities with interlobular septal wall thickening. Differential diagnosis includes pulmonary edema, infection (bacterial or fungal), pneumonitis, sarcoidosis, pulmonary alveolar proteinosis. 3. Persistent likely reactive hilar and mediastinal lymphadenopathy. Recommend attention on follow-up. 4. Small hiatal hernia. 5.  Aortic Atherosclerosis (ICD10-I70.0). Electronically Signed   By: Iven Finn M.D.   On: 10/27/2021 17:14   DG CHEST PORT 1 VIEW  Result Date: 10/31/2021 CLINICAL DATA:  Post RIGHT bronchoscopy EXAM: PORTABLE CHEST 1 VIEW COMPARISON:  Portable exam at 1200 hrs compared to 0543 hrs FINDINGS: Normal heart size, mediastinal contours, and pulmonary vascularity. Bibasilar opacities compatible with mild infiltrate or edema. No pleural effusion or pneumothorax. IMPRESSION: No pneumothorax post bronchoscopy. Electronically Signed   By: Lavonia Dana M.D.   On: 10/31/2021 12:45   DG CHEST PORT 1 VIEW  Result Date: 10/31/2021 CLINICAL DATA:  Shortness of breath, chest pain EXAM: PORTABLE CHEST 1 VIEW COMPARISON:  10/30/2021 FINDINGS: The heart size and mediastinal contours are within normal limits. Continued improvement in biateral heterogeneous airspace opacity. The visualized skeletal structures are unremarkable. IMPRESSION: Continued improvement in biateral heterogeneous airspace opacity. No new airspace opacity. Electronically Signed   By: Delanna Ahmadi M.D.   On: 10/31/2021  08:16   DG Chest Portable 1 View  Result Date: 10/27/2021 CLINICAL DATA:  Pt from home via EMS for eval of intermittent sob and cp x several days with associated coughHx of asthma, COPD, GERD, HTNSmoker EXAM: PORTABLE CHEST 1 VIEW COMPARISON:  06/03/2021 and older studies. FINDINGS: Cardiac silhouette is normal in size. No mediastinal or hilar masses. There are prominent bronchovascular markings most evident in the bases, greater on the right. No lung consolidation. No evidence of edema. No pleural effusion or pneumothorax. Skeletal structures are grossly intact. IMPRESSION: 1. Prominent bronchovascular markings in the lower lungs, right greater than left. This may all be chronic. A component of acute bronchial inflammation/infection should be considered in the proper clinical setting. Electronically Signed   By: Lajean Manes M.D.   On: 10/27/2021 15:21   ECHOCARDIOGRAM COMPLETE BUBBLE STUDY  Result Date: 10/29/2021    ECHOCARDIOGRAM REPORT   Patient Name:   CLARITY CISZEK Date of Exam: 10/29/2021 Medical Rec #:  [id number]       Height:       66.0 in Accession #:    [id number]      Weight:       208.1 lb Date of Birth:  [birth year]       BSA:          2.034 m Patient Age:  55 years        BP:           156/93 mmHg Patient Gender: F               HR:           60 bpm. Exam Location:  Inpatient Procedure: 2D Echo Indications:    ASD  History:        Patient has prior history of Echocardiogram examinations, most                 recent 02/19/2021. COPD.  Sonographer:    Jefferey Pica Referring Phys: [place]  1. Left ventricular ejection fraction, by estimation, is 60 to 65%. The left ventricle has normal function. The left ventricle has no regional wall motion abnormalities. There is mild left ventricular hypertrophy. Left ventricular diastolic parameters were normal.  2. Right ventricular systolic function is normal. The right ventricular size is normal. Tricuspid regurgitation signal  is inadequate for assessing PA pressure.  3. The mitral valve is normal in structure. Trivial mitral valve regurgitation. No evidence of mitral stenosis.  4. The aortic valve was not well visualized. Aortic valve regurgitation is not visualized. No aortic stenosis is present.  5. The inferior vena cava is dilated in size with >50% respiratory variability, suggesting right atrial pressure of 8 mmHg.  6. Agitated saline contrast bubble study was negative, with no evidence of any interatrial shunt. FINDINGS  Left Ventricle: Left ventricular ejection fraction, by estimation, is 60 to 65%. The left ventricle has normal function. The left ventricle has no regional wall motion abnormalities. The left ventricular internal cavity size was normal in size. There is  mild left ventricular hypertrophy. Left ventricular diastolic parameters were normal. Right Ventricle: The right ventricular size is normal. No increase in right ventricular wall thickness. Right ventricular systolic function is normal. Tricuspid regurgitation signal is inadequate for assessing PA pressure. Left Atrium: Left atrial size was normal in size. Right Atrium: Right atrial size was normal in size. Pericardium: Trivial pericardial effusion is present. Mitral Valve: The mitral valve is normal in structure. Trivial mitral valve regurgitation. No evidence of mitral valve stenosis. Tricuspid Valve: The tricuspid valve is normal in structure. Tricuspid valve regurgitation is trivial. Aortic Valve: The aortic valve was not well visualized. Aortic valve regurgitation is not visualized. No aortic stenosis is present. Aortic valve peak gradient measures 5.1 mmHg. Pulmonic Valve: The pulmonic valve was not well visualized. Pulmonic valve regurgitation is not visualized. Aorta: The aortic root and ascending aorta are structurally normal, with no evidence of dilitation. Venous: The inferior vena cava is dilated in size with greater than 50% respiratory variability,  suggesting right atrial pressure of 8 mmHg. IAS/Shunts: The interatrial septum was not well visualized. Agitated saline contrast was given intravenously to evaluate for intracardiac shunting. Agitated saline contrast bubble study was negative, with no evidence of any interatrial shunt.  LEFT VENTRICLE PLAX 2D LVIDd:         5.10 cm   Diastology LVIDs:         2.50 cm   LV e' medial:    6.42 cm/s LV PW:         1.00 cm   LV E/e' medial:  15.9 LV IVS:        1.10 cm   LV e' lateral:   10.70 cm/s LVOT diam:     2.10 cm   LV E/e' lateral: 9.5 LV SV:  92 LV SV Index:   45 LVOT Area:     3.46 cm  RIGHT VENTRICLE             IVC RV S prime:     13.50 cm/s  IVC diam: 2.10 cm TAPSE (M-mode): 2.0 cm LEFT ATRIUM             Index        RIGHT ATRIUM           Index LA diam:        3.40 cm 1.67 cm/m   RA Area:     16.50 cm LA Vol (A2C):   54.4 ml 26.75 ml/m  RA Volume:   45.00 ml  22.13 ml/m LA Vol (A4C):   50.3 ml 24.73 ml/m LA Biplane Vol: 52.9 ml 26.01 ml/m  AORTIC VALVE                 PULMONIC VALVE AV Area (Vmax): 3.56 cm     PV Vmax:       0.68 m/s AV Vmax:        113.00 cm/s  PV Peak grad:  1.8 mmHg AV Peak Grad:   5.1 mmHg LVOT Vmax:      116.00 cm/s LVOT Vmean:     69.500 cm/s LVOT VTI:       0.267 m  AORTA Ao Root diam: 3.30 cm Ao Asc diam:  3.30 cm MITRAL VALVE MV Area (PHT): 3.40 cm     SHUNTS MV Decel Time: 223 msec     Systemic VTI:  0.27 m MR Peak grad: 92.2 mmHg     Systemic Diam: 2.10 cm MR Vmax:      480.00 cm/s MV E velocity: 102.00 cm/s MV A velocity: 94.10 cm/s MV E/A ratio:  1.08 Oswaldo Milian MD Electronically signed by Oswaldo Milian MD Signature Date/Time: 10/29/2021/11:15:44 AM    Final    DG C-ARM BRONCHOSCOPY  Result Date: 10/31/2021 C-ARM BRONCHOSCOPY: Fluoroscopy was utilized by the requesting physician.  No radiographic interpretation.    Microbiology: Results for orders placed or performed during the hospital encounter of 10/27/21  Resp Panel by RT-PCR (Flu  A&B, Covid) Nasopharyngeal Swab     Status: None   Collection Time: 10/27/21  2:49 PM   Specimen: Nasopharyngeal Swab; Nasopharyngeal(NP) swabs in vial transport medium  Result Value Ref Range Status   SARS Coronavirus 2 by RT PCR NEGATIVE NEGATIVE Final    Comment: (NOTE) SARS-CoV-2 target nucleic acids are NOT DETECTED.  The SARS-CoV-2 RNA is generally detectable in upper respiratory specimens during the acute phase of infection. The lowest concentration of SARS-CoV-2 viral copies this assay can detect is 138 copies/mL. A negative result does not preclude SARS-Cov-2 infection and should not be used as the sole basis for treatment or other patient management decisions. A negative result may occur with  improper specimen collection/handling, submission of specimen other than nasopharyngeal swab, presence of viral mutation(s) within the areas targeted by this assay, and inadequate number of viral copies(<138 copies/mL). A negative result must be combined with clinical observations, patient history, and epidemiological information. The expected result is Negative.  Fact Sheet for Patients:  EntrepreneurPulse.com.au  Fact Sheet for Healthcare Providers:  IncredibleEmployment.be  This test is no t yet approved or cleared by the [place] FDA and  has been authorized for detection and/or diagnosis of SARS-CoV-2 by FDA under an Emergency Use Authorization (EUA). This EUA will remain  in effect (meaning this test can  be used) for the duration of the COVID-19 declaration under Section 564(b)(1) of the Act, 21 U.S.C.section 360bbb-3(b)(1), unless the authorization is terminated  or revoked sooner.       Influenza A by PCR NEGATIVE NEGATIVE Final   Influenza B by PCR NEGATIVE NEGATIVE Final    Comment: (NOTE) The Xpert Xpress SARS-CoV-2/FLU/RSV plus assay is intended as an aid in the diagnosis of influenza from Nasopharyngeal swab specimens  and should not be used as a sole basis for treatment. Nasal washings and aspirates are unacceptable for Xpert Xpress SARS-CoV-2/FLU/RSV testing.  Fact Sheet for Patients: EntrepreneurPulse.com.au  Fact Sheet for Healthcare Providers: IncredibleEmployment.be  This test is not yet approved or cleared by the [place] FDA and has been authorized for detection and/or diagnosis of SARS-CoV-2 by FDA under an Emergency Use Authorization (EUA). This EUA will remain in effect (meaning this test can be used) for the duration of the COVID-19 declaration under Section 564(b)(1) of the Act, 21 U.S.C. section 360bbb-3(b)(1), unless the authorization is terminated or revoked.  Performed at [facility], [place] [address], [place],  [address]     Labs: CBC: Recent Labs  Lab 10/27/21 1449 10/27/21 1510 10/27/21 2054 10/28/21 0601 10/29/21 0358 10/30/21 0339 10/31/21 0326  WBC 11.5*  --   --  10.0 14.8* 12.0* 10.0  NEUTROABS 7.9*  --   --   --  11.5* 7.9* 6.2  HGB 12.6   < > 13.3 12.2 12.8 12.5 13.3  HCT 40.1   < > 39.0 37.4 39.2 40.1 41.1  MCV 85.7  --   --  83.3 83.9 86.1 83.7  PLT 277  --   --  291 344 333 339   < > = values in this interval not displayed.   Basic Metabolic Panel: Recent Labs  Lab 10/27/21 1449 10/27/21 1510 10/27/21 2054 10/28/21 0601 10/29/21 0358 10/30/21 0339 10/31/21 0326  NA 137   < > 139 140 138 139 139  K 3.4*   < > 3.8 3.8 3.5 3.6 3.7  CL 102  --   --  104 98 95* 98  CO2 24  --   --  25 29 33* 33*  GLUCOSE 109*  --   --  154* 129* 130* 139*  BUN 16  --   --  14 20 22* 20  CREATININE 1.51*  --   --  0.78 0.82 0.81 0.64  CALCIUM 9.1  --   --  9.0 8.9 9.0 9.1  MG  --   --   --   --  2.1 2.1 2.2  PHOS  --   --   --   --  2.6 3.4 3.6   < > = values in this interval not displayed.   Liver Function Tests: Recent Labs  Lab 10/29/21 0358 10/30/21 0339 10/31/21 0326  AST 25 20 48*  ALT 19 20 56*   ALKPHOS 66 61 55  BILITOT 0.4 0.4 0.4  PROT 6.8 6.6 6.1*  ALBUMIN 2.9* 2.8* 2.7*   CBG: No results for input(s): GLUCAP in the last 168 hours.  Discharge time spent: greater than 30 minutes.  Signed: Eusebio Blazejewski J [place] DO [facility] 10/31/2021

## 2021-10-31 NOTE — Assessment & Plan Note (Signed)
Continue home HCTZ 50 mg p.o. daily, lisinopril 20 mg p.o. daily

## 2021-10-31 NOTE — Anesthesia Procedure Notes (Signed)
Procedure Name: Intubation Date/Time: 10/31/2021 10:45 AM Performed by: Janace Litten, CRNA Pre-anesthesia Checklist: Patient identified, Emergency Drugs available, Suction available and Patient being monitored Patient Re-evaluated:Patient Re-evaluated prior to induction Oxygen Delivery Method: Circle System Utilized Preoxygenation: Pre-oxygenation with 100% oxygen Induction Type: IV induction Ventilation: Mask ventilation without difficulty Laryngoscope Size: Mac and 3 Grade View: Grade I Tube type: Oral Tube size: 8.5 mm Number of attempts: 1 Airway Equipment and Method: Stylet and Oral airway Placement Confirmation: ETT inserted through vocal cords under direct vision, positive ETCO2 and breath sounds checked- equal and bilateral Secured at: 22 cm Tube secured with: Tape Dental Injury: Teeth and Oropharynx as per pre-operative assessment

## 2021-11-01 ENCOUNTER — Encounter (HOSPITAL_COMMUNITY): Payer: Self-pay | Admitting: Pulmonary Disease

## 2021-11-01 LAB — ACID FAST SMEAR (AFB, MYCOBACTERIA): Acid Fast Smear: NEGATIVE

## 2021-11-01 LAB — CYTOLOGY - NON PAP

## 2021-11-01 LAB — SURGICAL PATHOLOGY

## 2021-11-01 NOTE — Anesthesia Postprocedure Evaluation (Signed)
Anesthesia Post Note  Patient: Joan [name]  Procedure(s) Performed: VIDEO BRONCHOSCOPY WITH ENDOBRONCHIAL ULTRASOUND BRONCHIAL WASHINGS BIOPSY BRONCHIAL NEEDLE ASPIRATION BIOPSIES     Patient location during evaluation: PACU Anesthesia Type: General Level of consciousness: sedated and patient cooperative Pain management: pain level controlled Vital Signs Assessment: post-procedure vital signs reviewed and stable Respiratory status: spontaneous breathing Cardiovascular status: stable Anesthetic complications: no   No notable events documented.  Last Vitals:  Vitals:   10/31/21 1215 10/31/21 1230  BP: 119/84 (!) 162/95  Pulse: 99 87  Resp: 17 15  Temp:    SpO2: 96% 95%    Last Pain:  Vitals:   10/31/21 1200  TempSrc:   PainSc: 0-No pain                 Nolon Nations

## 2021-11-02 ENCOUNTER — Telehealth: Payer: Self-pay | Admitting: Pulmonary Disease

## 2021-11-02 LAB — CULTURE, RESPIRATORY W GRAM STAIN
Culture: NORMAL
Gram Stain: NONE SEEN

## 2021-11-02 NOTE — Telephone Encounter (Signed)
I called her to let her know that her pathology did not show cancer or granulomas.  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  I advised she keep her appointment on 3/1 with our office.

## 2021-11-02 NOTE — Telephone Encounter (Signed)
Patient has an appt with Dr. Verlee Monte on 11/14/21. Will close encounter.

## 2021-11-02 NOTE — Telephone Encounter (Signed)
Called and left voicemail for patient to call office back to schedule a hospital follow up with any provider in 1-2 weeks per Dr Pennie Banter.

## 2021-11-12 NOTE — Progress Notes (Deleted)
? ?Synopsis: Referred for ILD by Harvie Junior, MD ? ?Subjective:  ? ?PATIENT ID: Joan [name] GENDER: female DOB: Nov 12, 1965, MRN: [id number] ? ?No chief complaint on file. ? ?56yF with history of asthma, COPD, GERD, smoking, chronic hypoxemic respiratory failaure with recent CT chest concerning for ILD.  ? ?Underwent EBUS/BAL/TBLB 2/15, unrevealing for etiology of ILD. 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 She has clinically responded to steroid course. Currently on prednisone 30 mg daily. ? ?Otherwise pertinent review of systems is negative. ? ?Past Medical History:  ?Diagnosis Date  ? Anemia   ? Anxiety   ? Asthma   ? Chronic back pain   ? Chronic neck pain   ? Complex regional pain syndrome   ? COPD (chronic obstructive pulmonary disease) ([place])   ? GERD (gastroesophageal reflux disease)   ? Hematuria 09/07/2013  ? Hypertension   ? IBS (irritable bowel syndrome)   ? Migraine   ? Paresthesia of left arm   ?  ? ?Family History  ?Problem Relation Age of Onset  ? Diabetes Sister   ? Hypertension Sister   ? Heart disease Sister   ? Cancer Sister   ?     brain tumor  ? Heart disease Brother   ? Diabetes Brother   ? Cancer Maternal Aunt   ?     uterine  ? Cancer Maternal Grandmother   ?     uterine  ? Other Mother   ?     car accident  ?  ? ?Past Surgical History:  ?Procedure Laterality Date  ? BIOPSY  10/31/2021  ? Procedure: BIOPSY;  Surgeon: Juanito Doom, MD;  Location: [place];  Service: Cardiopulmonary;;  ? BRONCHIAL NEEDLE ASPIRATION BIOPSY  10/31/2021  ? Procedure: BRONCHIAL NEEDLE ASPIRATION BIOPSIES;  Surgeon: Juanito Doom, MD;  Location: [place];  Service: Cardiopulmonary;;  ? BRONCHIAL WASHINGS  10/31/2021  ? Procedure: BRONCHIAL WASHINGS;  Surgeon: Juanito Doom, MD;  Location: [facility];  Service: Cardiopulmonary;;  ? DILITATION & CURRETTAGE/HYSTROSCOPY WITH THERMACHOICE ABLATION N/A 08/17/2013  ? Procedure: DILATATION &  CURETTAGE/HYSTEROSCOPY WITH THERMACHOICE ABLATION;  Surgeon: Jonnie Kind, MD;  Location: [facility];  Service: Gynecology;  Laterality: N/A;  18 ml D5W in & out; total therapy time= 9 minutes 33 seconds; temp= 87 degrees celcius  ? HERNIA REPAIR    ? ventral hernia  ? POLYPECTOMY N/A 08/17/2013  ? Procedure: REMOVAL ENDOMETRIAL POLYP;  Surgeon: Jonnie Kind, MD;  Location: [facility];  Service: Gynecology;  Laterality: N/A;  ? TUBAL LIGATION    ? ULNAR NERVE REPAIR Left   ? [facility] 2013  ? VIDEO BRONCHOSCOPY WITH ENDOBRONCHIAL ULTRASOUND N/A 10/31/2021  ? Procedure: VIDEO BRONCHOSCOPY WITH ENDOBRONCHIAL ULTRASOUND;  Surgeon: Juanito Doom, MD;  Location: [place];  Service: Cardiopulmonary;  Laterality: N/A;  ? ? ?Social History  ? ?Socioeconomic History  ? Marital status: Married  ?  Spouse name: Mitzi Hansen  ? Number of children: 4  ? Years of education: College  ? Highest education level: Not on file  ?Occupational History  ?  Employer: [employer]  ?  Comment: n/a  ?Tobacco Use  ? Smoking status: Every Day  ?  Packs/day: 1.00  ?  Years: 26.00  ?  Pack years: 26.00  ?  Types: Cigarettes  ? Smokeless tobacco: Never  ? Tobacco comments:  ?  04/23/16 1/2- 3/4 PPD  ?Substance and Sexual Activity  ?  Alcohol use: No  ? Drug use: No  ? Sexual activity: Yes  ?  Birth control/protection: None, Surgical  ?  Comment: not at this time  ?Other Topics Concern  ? Not on file  ?Social History Narrative  ? Patient lives at home with family.  ? Caffeine Use: 6-7 cups daily  ? ?Social Determinants of Health  ? ?Financial Resource Strain: Not on file  ?Food Insecurity: Not on file  ?Transportation Needs: Not on file  ?Physical Activity: Not on file  ?Stress: Not on file  ?Social Connections: Not on file  ?Intimate Partner Violence: Not on file  ?  ? ?Allergies  ?Allergen Reactions  ? Gabapentin Other (See Comments) and Hypertension  ?  Elevated BP requiring doubling of current meds  ? Other Other (See Comments)  ?  Unnamed IV  meds for migraine, patient stated it made her feel like "something was crawling all over" her body  ?  ? ?Outpatient Medications Prior to Visit  ?Medication Sig Dispense Refill  ? albuterol (PROVENTIL) (2.5 MG/3ML) 0.083% nebulizer solution Take 3 mLs (2.5 mg total) by nebulization every 6 (six) hours as needed for wheezing or shortness of breath. 75 mL 0  ? albuterol (VENTOLIN HFA) 108 (90 Base) MCG/ACT inhaler Inhale 2 puffs into the lungs every 4 (four) hours as needed for wheezing or shortness of breath.    ? ALPRAZolam (XANAX) 1 MG tablet Take 1 mg by mouth 3 (three) times daily.    ? baclofen (LIORESAL) 10 MG tablet Take 10 mg by mouth in the morning, at noon, and at bedtime. Morning, afternon, and    ? divalproex (DEPAKOTE) 500 MG DR tablet Take 1 tablet (500 mg total) by mouth daily. 30 tablet 0  ? DULoxetine (CYMBALTA) 60 MG capsule Take 1 capsule (60 mg total) by mouth in the morning and at bedtime.    ? gabapentin (NEURONTIN) 300 MG capsule Take 600 mg by mouth 4 (four) times daily.    ? hydrochlorothiazide (HYDRODIURIL) 50 MG tablet Take 1 tablet (50 mg total) by mouth daily. 30 tablet 0  ? lisinopril (ZESTRIL) 20 MG tablet Take 1 tablet (20 mg total) by mouth daily. 30 tablet 0  ? oxyCODONE (ROXICODONE) 15 MG immediate release tablet Take 15 mg by mouth 4 (four) times daily.    ? pantoprazole (PROTONIX) 40 MG tablet Take 1 tablet (40 mg total) by mouth daily. 30 tablet 2  ? predniSONE (DELTASONE) 10 MG tablet Take 3 tablets (30 mg total) by mouth daily. 90 tablet 0  ? topiramate (TOPAMAX) 25 MG tablet Take 25 mg by mouth daily as needed (for more than 2 seizures a day).    ? ?No facility-administered medications prior to visit.  ? ? ? ? ? ?Objective:  ? ?Physical Exam: ? ?General appearance: 56 y.o., female, NAD, conversant  ?Eyes: anicteric sclerae; PERRL, tracking appropriately ?HENT: NCAT; MMM ?Neck: Trachea midline; no lymphadenopathy, no JVD ?Lungs: CTAB, no crackles, no wheeze, with normal  respiratory effort ?CV: RRR, no murmur  ?Abdomen: Soft, non-tender; non-distended, BS present  ?Extremities: No peripheral edema, warm ?Skin: Normal turgor and texture; no rash ?Psych: Appropriate affect ?Neuro: Alert and oriented to person and place, no focal deficit  ? ? ? ?There were no vitals filed for this visit. ?  on *** LPM *** RA ?BMI Readings from Last 3 Encounters:  ?10/27/21 33.59 kg/m?  ?06/03/21 35.37 kg/m?  ?02/17/21 35.51 kg/m?  ? ?Wt Readings from Last 3 Encounters:  ?  10/27/21 208 lb 1.8 oz (94.4 kg)  ?06/03/21 219 lb 2.2 oz (99.4 kg)  ?02/17/21 220 lb 0.3 oz (99.8 kg)  ? ? ? ?CBC ?   ?Component Value Date/Time  ? WBC 10.0 10/31/2021 0326  ? RBC 4.91 10/31/2021 0326  ? HGB 13.3 10/31/2021 0326  ? HCT 41.1 10/31/2021 0326  ? PLT 339 10/31/2021 0326  ? MCV 83.7 10/31/2021 0326  ? MCH 27.1 10/31/2021 0326  ? MCHC 32.4 10/31/2021 0326  ? RDW 14.5 10/31/2021 0326  ? LYMPHSABS 3.1 10/31/2021 0326  ? MONOABS 0.5 10/31/2021 0326  ? EOSABS 0.0 10/31/2021 0326  ? BASOSABS 0.0 10/31/2021 0326  ? ? ?RF mildly elevated ? ?Chest Imaging: ?CTA Chest 10/27/21 reviewed by me with widespread ggo, mediastinal LAD ? ?Pulmonary Functions Testing Results: ?No flowsheet data found. ? ? ? ?Echocardiogram:  ? ?TTE 02/19/21: ?TTE 02/19/2021 >> LVEF 60-65% with normal diastolic function.  Mildly enlarged left atrium, intra-atrial color-flow suggestive of possible shunt, mildly dilated RA ? ?Heart Catheterization: *** ?   ?Assessment & Plan:  ? ? ?Plan: ? ? ? ? ? ?Maryjane Hurter, MD ?[facility] ?11/12/2021 2:16 PM  ? ?

## 2021-11-14 ENCOUNTER — Institutional Professional Consult (permissible substitution): Payer: Medicare Other | Admitting: Student

## 2021-11-29 LAB — FUNGUS CULTURE WITH STAIN

## 2021-11-29 LAB — FUNGAL ORGANISM REFLEX

## 2021-11-29 LAB — FUNGUS CULTURE RESULT

## 2021-12-05 NOTE — Progress Notes (Deleted)
? ?Synopsis: Referred for ILD by Harvie Junior, MD ? ?Subjective:  ? ?PATIENT ID: Joan [name] GENDER: female DOB: 1966/08/16, MRN: [id number] ? ?No chief complaint on file. ? ?56yF with history of asthma, COPD, GERD, smoking, chronic hypoxemic respiratory failaure with recent CT chest concerning for ILD.  ? ?Underwent EBUS/BAL/TBLB 2/15, unrevealing for etiology of ILD. 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 She has clinically responded to steroid course. Currently on prednisone 30 mg daily. ? ?Otherwise pertinent review of systems is negative. ? ?Past Medical History:  ?Diagnosis Date  ? Anemia   ? Anxiety   ? Asthma   ? Chronic back pain   ? Chronic neck pain   ? Complex regional pain syndrome   ? COPD (chronic obstructive pulmonary disease) ([place])   ? GERD (gastroesophageal reflux disease)   ? Hematuria 09/07/2013  ? Hypertension   ? IBS (irritable bowel syndrome)   ? Migraine   ? Paresthesia of left arm   ?  ? ?Family History  ?Problem Relation Age of Onset  ? Diabetes Sister   ? Hypertension Sister   ? Heart disease Sister   ? Cancer Sister   ?     brain tumor  ? Heart disease Brother   ? Diabetes Brother   ? Cancer Maternal Aunt   ?     uterine  ? Cancer Maternal Grandmother   ?     uterine  ? Other Mother   ?     car accident  ?  ? ?Past Surgical History:  ?Procedure Laterality Date  ? BIOPSY  10/31/2021  ? Procedure: BIOPSY;  Surgeon: Juanito Doom, MD;  Location: [place];  Service: Cardiopulmonary;;  ? BRONCHIAL NEEDLE ASPIRATION BIOPSY  10/31/2021  ? Procedure: BRONCHIAL NEEDLE ASPIRATION BIOPSIES;  Surgeon: Juanito Doom, MD;  Location: [facility];  Service: Cardiopulmonary;;  ? BRONCHIAL WASHINGS  10/31/2021  ? Procedure: BRONCHIAL WASHINGS;  Surgeon: Juanito Doom, MD;  Location: [place];  Service: Cardiopulmonary;;  ? DILITATION & CURRETTAGE/HYSTROSCOPY WITH THERMACHOICE ABLATION N/A 08/17/2013  ? Procedure: DILATATION &  CURETTAGE/HYSTEROSCOPY WITH THERMACHOICE ABLATION;  Surgeon: Jonnie Kind, MD;  Location: [facility];  Service: Gynecology;  Laterality: N/A;  18 ml D5W in & out; total therapy time= 9 minutes 33 seconds; temp= 87 degrees celcius  ? HERNIA REPAIR    ? ventral hernia  ? POLYPECTOMY N/A 08/17/2013  ? Procedure: REMOVAL ENDOMETRIAL POLYP;  Surgeon: Jonnie Kind, MD;  Location: [facility];  Service: Gynecology;  Laterality: N/A;  ? TUBAL LIGATION    ? ULNAR NERVE REPAIR Left   ? [facility] 2013  ? VIDEO BRONCHOSCOPY WITH ENDOBRONCHIAL ULTRASOUND N/A 10/31/2021  ? Procedure: VIDEO BRONCHOSCOPY WITH ENDOBRONCHIAL ULTRASOUND;  Surgeon: Juanito Doom, MD;  Location: [place];  Service: Cardiopulmonary;  Laterality: N/A;  ? ? ?Social History  ? ?Socioeconomic History  ? Marital status: Married  ?  Spouse name: Mitzi Hansen  ? Number of children: 4  ? Years of education: College  ? Highest education level: Not on file  ?Occupational History  ?  Employer: [employer]  ?  Comment: n/a  ?Tobacco Use  ? Smoking status: Every Day  ?  Packs/day: 1.00  ?  Years: 26.00  ?  Pack years: 26.00  ?  Types: Cigarettes  ? Smokeless tobacco: Never  ? Tobacco comments:  ?  04/23/16 1/2- 3/4 PPD  ?Substance and Sexual Activity  ?  Alcohol use: No  ? Drug use: No  ? Sexual activity: Yes  ?  Birth control/protection: None, Surgical  ?  Comment: not at this time  ?Other Topics Concern  ? Not on file  ?Social History Narrative  ? Patient lives at home with family.  ? Caffeine Use: 6-7 cups daily  ? ?Social Determinants of Health  ? ?Financial Resource Strain: Not on file  ?Food Insecurity: Not on file  ?Transportation Needs: Not on file  ?Physical Activity: Not on file  ?Stress: Not on file  ?Social Connections: Not on file  ?Intimate Partner Violence: Not on file  ?  ? ?Allergies  ?Allergen Reactions  ? Gabapentin Other (See Comments) and Hypertension  ?  Elevated BP requiring doubling of current meds  ? Other Other (See Comments)  ?  Unnamed IV  meds for migraine, patient stated it made her feel like "something was crawling all over" her body  ?  ? ?Outpatient Medications Prior to Visit  ?Medication Sig Dispense Refill  ? albuterol (PROVENTIL) (2.5 MG/3ML) 0.083% nebulizer solution Take 3 mLs (2.5 mg total) by nebulization every 6 (six) hours as needed for wheezing or shortness of breath. 75 mL 0  ? albuterol (VENTOLIN HFA) 108 (90 Base) MCG/ACT inhaler Inhale 2 puffs into the lungs every 4 (four) hours as needed for wheezing or shortness of breath.    ? ALPRAZolam (XANAX) 1 MG tablet Take 1 mg by mouth 3 (three) times daily.    ? baclofen (LIORESAL) 10 MG tablet Take 10 mg by mouth in the morning, at noon, and at bedtime. Morning, afternon, and    ? divalproex (DEPAKOTE) 500 MG DR tablet Take 1 tablet (500 mg total) by mouth daily. 30 tablet 0  ? DULoxetine (CYMBALTA) 60 MG capsule Take 1 capsule (60 mg total) by mouth in the morning and at bedtime.    ? gabapentin (NEURONTIN) 300 MG capsule Take 600 mg by mouth 4 (four) times daily.    ? hydrochlorothiazide (HYDRODIURIL) 50 MG tablet Take 1 tablet (50 mg total) by mouth daily. 30 tablet 0  ? lisinopril (ZESTRIL) 20 MG tablet Take 1 tablet (20 mg total) by mouth daily. 30 tablet 0  ? oxyCODONE (ROXICODONE) 15 MG immediate release tablet Take 15 mg by mouth 4 (four) times daily.    ? pantoprazole (PROTONIX) 40 MG tablet Take 1 tablet (40 mg total) by mouth daily. 30 tablet 2  ? topiramate (TOPAMAX) 25 MG tablet Take 25 mg by mouth daily as needed (for more than 2 seizures a day).    ? ?No facility-administered medications prior to visit.  ? ? ? ? ? ?Objective:  ? ?Physical Exam: ? ?General appearance: 56 y.o., female, NAD, conversant  ?Eyes: anicteric sclerae; PERRL, tracking appropriately ?HENT: NCAT; MMM ?Neck: Trachea midline; no lymphadenopathy, no JVD ?Lungs: CTAB, no crackles, no wheeze, with normal respiratory effort ?CV: RRR, no murmur  ?Abdomen: Soft, non-tender; non-distended, BS present   ?Extremities: No peripheral edema, warm ?Skin: Normal turgor and texture; no rash ?Psych: Appropriate affect ?Neuro: Alert and oriented to person and place, no focal deficit  ? ? ? ?There were no vitals filed for this visit. ?  on *** LPM *** RA ?BMI Readings from Last 3 Encounters:  ?10/27/21 33.59 kg/m?  ?06/03/21 35.37 kg/m?  ?02/17/21 35.51 kg/m?  ? ?Wt Readings from Last 3 Encounters:  ?10/27/21 208 lb 1.8 oz (94.4 kg)  ?06/03/21 219 lb 2.2 oz (99.4 kg)  ?02/17/21 220 lb  0.3 oz (99.8 kg)  ? ? ? ?CBC ?   ?Component Value Date/Time  ? WBC 10.0 10/31/2021 0326  ? RBC 4.91 10/31/2021 0326  ? HGB 13.3 10/31/2021 0326  ? HCT 41.1 10/31/2021 0326  ? PLT 339 10/31/2021 0326  ? MCV 83.7 10/31/2021 0326  ? MCH 27.1 10/31/2021 0326  ? MCHC 32.4 10/31/2021 0326  ? RDW 14.5 10/31/2021 0326  ? LYMPHSABS 3.1 10/31/2021 0326  ? MONOABS 0.5 10/31/2021 0326  ? EOSABS 0.0 10/31/2021 0326  ? BASOSABS 0.0 10/31/2021 0326  ? ? ?RF mildly elevated ? ?Chest Imaging: ?CTA Chest 10/27/21 reviewed by me with widespread ggo, mediastinal LAD ? ?Pulmonary Functions Testing Results: ?   ? View : No data to display.  ?  ?  ?  ? ? ? ? ?Echocardiogram:  ? ?TTE 02/19/21: ?TTE 02/19/2021 >> LVEF 60-65% with normal diastolic function.  Mildly enlarged left atrium, intra-atrial color-flow suggestive of possible shunt, mildly dilated RA ? ?Heart Catheterization: *** ?   ?Assessment & Plan:  ? ? ?Plan: ? ? ? ? ? ?Maryjane Hurter, MD ?[facility] ?12/05/2021 7:00 PM  ? ?

## 2021-12-06 ENCOUNTER — Institutional Professional Consult (permissible substitution): Payer: Medicare Other | Admitting: Student

## 2021-12-16 LAB — ACID FAST CULTURE WITH REFLEXED SENSITIVITIES (MYCOBACTERIA): Acid Fast Culture: NEGATIVE

## 2022-01-07 ENCOUNTER — Ambulatory Visit (INDEPENDENT_AMBULATORY_CARE_PROVIDER_SITE_OTHER): Payer: Medicare Other | Admitting: Internal Medicine

## 2022-01-07 ENCOUNTER — Encounter: Payer: Self-pay | Admitting: Internal Medicine

## 2022-01-07 DIAGNOSIS — R918 Other nonspecific abnormal finding of lung field: Secondary | ICD-10-CM | POA: Diagnosis not present

## 2022-01-07 DIAGNOSIS — I10 Essential (primary) hypertension: Secondary | ICD-10-CM | POA: Diagnosis not present

## 2022-01-07 DIAGNOSIS — F17210 Nicotine dependence, cigarettes, uncomplicated: Secondary | ICD-10-CM

## 2022-01-07 DIAGNOSIS — R0602 Shortness of breath: Secondary | ICD-10-CM

## 2022-01-07 MED ORDER — OLMESARTAN MEDOXOMIL 20 MG PO TABS: 20.0000 mg | ORAL_TABLET | Freq: Every day | ORAL | 11 refills | Status: DC

## 2022-01-07 MED ORDER — FAMOTIDINE 20 MG PO TABS
ORAL_TABLET | ORAL | 11 refills | Status: DC
Start: 2022-01-07 — End: 2023-11-04

## 2022-01-07 NOTE — Patient Instructions (Addendum)
Pantoprazole (protonix) 40 mg   Take  30-60 min before first meal of the day and Pepcid (famotidine)  20 mg after supper until return to office - this is the best way to tell whether stomach acid is contributing to your problem.  ? ?GERD (REFLUX)  is an extremely common cause of respiratory symptoms just like yours , many times with no obvious heartburn at all.  ? ? It can be treated with medication, but also with lifestyle changes including elevation of the head of your bed (ideally with 6 -8inch blocks under the headboard of your bed),  Smoking cessation, avoidance of late meals, excessive alcohol, and avoid fatty foods, chocolate, peppermint, colas, red wine, and acidic juices such as orange juice.  ?NO MINT OR MENTHOL PRODUCTS SO NO COUGH DROPS  ?USE SUGARLESS CANDY INSTEAD (Jolley ranchers or Stover's or Life Savers) or even ice chips will also do - the key is to swallow to prevent all throat clearing. ?NO OIL BASED VITAMINS - use powdered substitutes.  Avoid fish oil when coughing.   ? ? ?Stop lisinopril and start olmesartan 20 mg one daily  in its place  ? ?The key is to stop smoking completely before smoking completely stops you! ? ?Only use your albuterol as a rescue medication to be used if you can't catch your breath by resting or doing a relaxed purse lip breathing pattern.  ?- The less you use it, the better it will work when you need it. ?- Ok to use up to 2 puffs  every 4 hours if you must but call for immediate appointment if use goes up over your usual need ?- Don't leave home without it !!  (think of it like the spare tire for your car)  ? ?Please schedule a follow up office visit in 6 weeks, call sooner if needed  ?Add: ?Needs pfts and hrct by return  ?  ?   ?

## 2022-01-07 NOTE — Progress Notes (Signed)
? ?Joan [name], female    DOB: 27-Oct-1965   MRN: [id number] ? ? ?Brief patient profile:  ?43 yobf actively smoking with dx of "asthma all her life "  referred to pulmonary clinic 01/07/2022 by triad p 2 admits ? ?Worse when arrived in [place] From [place]  ? ?Worse also L arm paralysis in 2014   ? ?[facility] eval 10/28/21 ?Assessment & Plan:  ?Acute hypoxemic respiratory failure and in the setting of bilateral alveolar infiltrates, superimposed on her asthma/COPD.  Review of imaging shows that she has had similar bilateral infiltrates with associated lymphadenopathy for several years.  Differential diagnosis here includes tobacco related pneumonitis such as RB-ILD, DIP.  Other possibilities include sarcoidosis, autoimmune induced interstitial lung disease, eosinophilic pneumonia (history of asthma), hypersensitivity pneumonitis or other exposure such as occupational or illicit substances.  RB-ILD and/or DIP would seem to be the most likely explanation.  Need to consider superimposed cardiogenic pulmonary edema, prior echocardiogram with a possible ASD but intact left and right heart function. ?  ?Recommendations: ?-Repeat ABG reveals adequate ventilation.  No clear indication for BiPAP at this time. ?-Ultimately patient needs a bronchoscopy and EBUS with transbronchial biopsies, nodal biopsies and BAL for culture data, cell count, CD4/CD8 ratio.  Unfortunately probably a poor candidate for bronchoscopy currently given her current oxygen needs.  We will need to push for this once acute respiratory status improves. ?-Agree with empiric diuresis as she can tolerate ?-Given her degree of respiratory failure agree with empiric corticosteroids, although improvement in her infiltrates may actually impact utility of diagnostics (bronchoscopy etc.) ?-Sputum culture now for bacterial, AFB organisms, eosinophil count ?-Autoimmune labs: RF, anti-Jo1 antibody, anti-DNA antibody, CCP, SSA/B, ANCA, ANA, ACE level, HSP panel> all WNL x for  RA 18  ?-Repeat echocardiogram with a bubble study to better evaluate current LV function, RV function, possible shunt>  WNL ?-Definitive treatment for DIP or RB-ILD is smoking cessation.  Will be critical for her to stop. ?-Stop Dulera as is redundant with DuoNeb/Pulmicort nebs. ? ? ? ?FOB mcQuaid 10/31/21 neg tbbx / neg ebus/ only pos = RA 18 (14 upper nl)  ? ? ?History of Present Illness  ?01/07/2022  Pulmonary/ 1st office eval/Aleigh Grunden / still smoking and on ACEi  ?Chief Complaint  ?Patient presents with  ? Pulmonary Consult  ?  Referred by Dr York Ram. Pt with hypoxia. Pt c/o SOB "when I catch a cold" or she is exposed to strong smells.   ?Dyspnea:  limited by back at work at [employer] / unless flaring not having limiting doe  ?Cough: none now ?Sleep: worse at hs  - feels like smothering immediately so sits up at least 45 degrees ?SABA use: one day prior  ?02  at 3lpm hs and prn  ? ?No obvious day to day or daytime variability or assoc excess/ purulent sputum or mucus plugs or hemoptysis or cp or chest tightness, subjective wheeze or overt sinus or hb symptoms.  ? ?  Also denies any obvious fluctuation of symptoms with weather or environmental changes or other aggravating or alleviating factors except as outlined above  ? ?No unusual exposure hx or h/o childhood pna/ asthma or knowledge of premature birth. ? ?Current Allergies, Complete Past Medical History, Past Surgical History, Family History, and Social History were reviewed in [employer] [facility] record. ? ?ROS  The following are not active complaints unless bolded ?Hoarseness, sore throat, dysphagia, dental problems, itching, sneezing,  nasal congestion or discharge of excess  mucus or purulent secretions, ear ache,   fever, chills, sweats, unintended wt loss or wt gain, classically pleuritic or exertional cp,  orthopnea pnd or arm/hand swelling  or leg swelling, presyncope, palpitations, abdominal pain, anorexia, nausea, vomiting, diarrhea   or change in bowel habits or change in bladder habits, change in stools or change in urine, dysuria, hematuria,  rash, arthralgias, visual complaints, headache, numbness, weakness or ataxia or problems with walking or coordination,  change in mood or  memory. ?      ?   ? ?Past Medical History:  ?Diagnosis Date  ? Anemia   ? Anxiety   ? Asthma   ? Chronic back pain   ? Chronic neck pain   ? Complex regional pain syndrome   ? COPD (chronic obstructive pulmonary disease) ([place])   ? GERD (gastroesophageal reflux disease)   ? Hematuria 09/07/2013  ? Hypertension   ? IBS (irritable bowel syndrome)   ? Migraine   ? Paresthesia of left arm   ? ? ?Outpatient Medications Prior to Visit  ?Medication Sig Dispense Refill  ? albuterol (PROVENTIL) (2.5 MG/3ML) 0.083% nebulizer solution Take 3 mLs (2.5 mg total) by nebulization every 6 (six) hours as needed for wheezing or shortness of breath. 75 mL 0  ? albuterol (VENTOLIN HFA) 108 (90 Base) MCG/ACT inhaler Inhale 2 puffs into the lungs every 4 (four) hours as needed for wheezing or shortness of breath.    ? ALPRAZolam (XANAX) 1 MG tablet Take 1 mg by mouth 3 (three) times daily.    ? baclofen (LIORESAL) 10 MG tablet Take 10 mg by mouth in the morning, at noon, and at bedtime. Morning, afternon, and    ? divalproex (DEPAKOTE) 500 MG DR tablet Take 1 tablet (500 mg total) by mouth daily. 30 tablet 0  ? DULoxetine (CYMBALTA) 60 MG capsule Take 1 capsule (60 mg total) by mouth in the morning and at bedtime.    ? gabapentin (NEURONTIN) 300 MG capsule Take 600 mg by mouth 4 (four) times daily.    ? hydrochlorothiazide (HYDRODIURIL) 50 MG tablet Take 1 tablet (50 mg total) by mouth daily. 30 tablet 0  ? lisinopril (ZESTRIL) 20 MG tablet Take 1 tablet (20 mg total) by mouth daily. 30 tablet 0  ? oxyCODONE (ROXICODONE) 15 MG immediate release tablet Take 15 mg by mouth 4 (four) times daily.    ? pantoprazole (PROTONIX) 40 MG tablet Take 1 tablet (40 mg total) by mouth daily. 30 tablet 2   ? topiramate (TOPAMAX) 25 MG tablet Take 25 mg by mouth daily as needed (for more than 2 seizures a day).    ? ?No facility-administered medications prior to visit.  ? ? ? ?Objective:  ?  ? ?BP 122/80 (BP Location: Left Arm, Cuff Size: Normal)   Pulse 81   Temp 98.2 ?F (36.8 ?C) (Oral)   Ht _0  (1.676 m)   Wt 208 lb 12.8 oz (94.7 kg)   SpO2 91% Comment: on RA  BMI 33.70 kg/m?  ? ?SpO2: 91 % (on RA) ? ?Obese amb bf nad ? ? HEENT : nl exam   ? ? ?NECK :  without JVD/Nodes/TM/ nl carotid upstrokes bilaterally ? ? ?LUNGS: no acc muscle use,  Nl contour chest which is clear to A and P bilaterally without cough on insp or exp maneuvers ? ? ?CV:  RRR  no s3 or murmur or increase in P2, and no edema  ? ?ABD:  soft and nontender  with nl inspiratory excursion in the supine position. No bruits or organomegaly appreciated, bowel sounds nl ? ?MS:  Nl gait/ ext warm without deformities, calf tenderness, cyanosis or clubbing ?No obvious joint restrictions  ? ?SKIN: warm and dry without lesions   ? ? ?NEURO:  alert, approp, nl sensorium with  no motor or cerebellar deficits apparent.  ? ? ? ?I personally reviewed images and agree with radiology impression as follows:  ? Chest CTa 10/27/21 ?1. No pulmonary embolus. ?2. Chronic diffuse peribronchovascular ground-glass airspace ?opacities with interlobular septal wall thickening. Differential ?diagnosis includes pulmonary edema, infection (bacterial or fungal), ?pneumonitis, sarcoidosis, pulmonary alveolar proteinosis. ?3. Persistent likely reactive hilar and mediastinal lymphadenopathy. ?Recommend attention on follow-up. ?   ?Assessment  ? 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 ?The discrepancy between her chronic findings  on CT and clinical hx which is extremely variable ? Steroid Resp  fit with sarcoid but the bx's are neg of both Tbbx (though note no alv on tbbx) and LN.   ? ?I agree with Dr Lamonte Sakai with ddx and importance

## 2022-01-08 ENCOUNTER — Encounter: Payer: Self-pay | Admitting: Internal Medicine

## 2022-01-08 NOTE — Assessment & Plan Note (Signed)
Try off acei 01/07/2022  ? ?In the best review of chronic cough to date ( NEJM 2016 375 [phone]) ,  ACEi are now felt to cause cough in up to  20% of pts which is a 4 fold increase from previous reports and does not include the variety of non-specific complaints we see in pulmonary clinic in pts on ACEi but previously attributed to another dx like  Copd/asthma and  include PNDS, throat and chest congestion, "bronchitis", unexplained dyspnea and noct "strangling" sensations, and hoarseness, but also  atypical /refractory GERD symptoms like dysphagia and "bad heartburn"  ? ?The only way I know  to prove this is not an "ACEi Case" is a trial off ACEi x a minimum of 6 weeks then regroup.  ? ?>>> try benicar 20 mg daily  ?

## 2022-01-08 NOTE — Assessment & Plan Note (Signed)
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 ?The discrepancy between her chronic findings on CT and clinical hx which is extremely variable ? Steroid Resp  fit with sarcoid but the bx's are neg of both Tbbx (though note no alv on tbbx) and LN.   ? ?I agree with Dr Lamonte Sakai with ddx and importance of stopping smoking since DIP EG and RBILD are on the list and will need additional w/u including repeat HRCT now and serial walking sats (though more limited by back) and f/u pfts as well  ?

## 2022-01-08 NOTE — Assessment & Plan Note (Signed)
Try off acei and on max gerd rx  01/07/2022 >>> ? ?DDX of  difficult airways management almost all start with A and  include Adherence, Ace Inhibitors, Acid Reflux, Active Sinus Disease, Alpha 1 Antitripsin deficiency, Anxiety masquerading as Airways dz,  ABPA,  Allergy(esp in young), Aspiration (esp in elderly), Adverse effects of meds,  Active smoking or vaping, A bunch of PE's (a small clot burden can't cause this syndrome unless there is already severe underlying pulm or vascular dz with poor reserve) plus two Bs  = Bronchiectasis and Beta blocker use..and one C= CHF  ? ?Adherence is always the initial "prime suspect" and is a multilayered concern that requires a "trust but verify" approach in every patient - starting with knowing how to use medications, especially inhalers, correctly, keeping up with refills and understanding the fundamental difference between maintenance and prns vs those medications only taken for a very short course and then stopped and not refilled.  ?- - The proper method of use, as well as anticipated side effects, of a metered-dose inhaler were discussed and demonstrated to the patient using teach back method  ?- return with all meds in hand using a trust but verify approach to confirm accurate Medication  Reconciliation The principal here is that until we are certain that the  patients are doing what we've asked, it makes no sense to ask them to do more.  ? ? ?ACEi adverse effects at the  top of the usual list of suspects and the only way to rule it out is a trial off > see sep  a/p   ? ?Active smoker - see sp a/p  ? ?? Acid (or non-acid) GERD > always difficult to exclude as up to 75% of pts in some series report no assoc GI/ Heartburn symptoms> rec max (24h)  acid suppression and diet restrictions/ reviewed and instructions given in writing.  ? ?? Allergy/ asthma > use saba prn  ?Re SABA :  I spent extra time with pt today reviewing appropriate use of albuterol for prn use on exertion  with the following points: ?1) saba is for relief of sob that does not improve by walking a slower pace or resting but rather if the pt does not improve after trying this first. ?2) If the pt is convinced, as many are, that saba helps recover from activity faster then it's easy to tell if this is the case by re-challenging : ie stop, take the inhaler, then p 5 minutes try the exact same activity (intensity of workload) that just caused the symptoms and see if they are substantially diminished or not after saba ?3) if there is an activity that reproducibly causes the symptoms, try the saba 15 min before the activity on alternate days  ? ?If in fact the saba really does help, then fine to continue to use it prn but advised may need to look closer at the maintenance regimen being used to achieve better control of airways disease with exertion.  For now leave off maint rx but if starts needing saba more than twice daily start symb 160 2bid  ? ?? A bunch of PEs > ruled out last admit ? ?? chf > also ruled out last admit  ? ?

## 2022-01-08 NOTE — Assessment & Plan Note (Signed)
Counseled re importance of smoking cessation but did not meet time criteria for separate billing   ? ? ?    ?  ? ?Each maintenance medication was reviewed in detail including emphasizing most importantly the difference between maintenance and prns and under what circumstances the prns are to be triggered using an action plan format where appropriate. ? ?Total time for H and P, chart review, counseling, reviewing hfa device(s) and generating customized AVS unique to this office visit / same day charting = 47 min pt new to me having not seen in > 3 y ?     ?

## 2022-01-10 ENCOUNTER — Telehealth: Payer: Self-pay | Admitting: Internal Medicine

## 2022-01-11 NOTE — Telephone Encounter (Signed)
Called patient to make sure she was ok with [place] releasing office notes. She did not answer. Left message for her to call back.  ?

## 2022-01-15 ENCOUNTER — Telehealth: Payer: Self-pay | Admitting: *Deleted

## 2022-01-15 DIAGNOSIS — R918 Other nonspecific abnormal finding of lung field: Secondary | ICD-10-CM

## 2022-01-15 NOTE — Telephone Encounter (Signed)
Spoke with the pt  ?She was agreeable to HRCT and PFT  ?Orders placed ?No availability here so hospital PFT ordered ?

## 2022-01-15 NOTE — Telephone Encounter (Signed)
-----   Message from Tanda Rockers, MD sent at 01/08/2022  4:44 AM EDT ----- ?Needs pfts and HRCT chest  pulmonary infiltrates dx  ? ?

## 2022-01-15 NOTE — Telephone Encounter (Signed)
Pt's OV notes have been faxed to Dr. Jimmye Norman' office at provided fax number. Nothing further needed. ?

## 2022-02-05 ENCOUNTER — Telehealth: Payer: Self-pay | Admitting: Internal Medicine

## 2022-02-05 MED ORDER — ALBUTEROL SULFATE HFA 108 (90 BASE) MCG/ACT IN AERS
2.0000 | INHALATION_SPRAY | RESPIRATORY_TRACT | 4 refills | Status: AC | PRN
Start: 2022-02-05 — End: ?

## 2022-02-05 NOTE — Telephone Encounter (Signed)
Called patient to verify that she wanted her Albuterol inhaler refilled. Patient verbalized that she needed the inhaler refilled. Went over pharmacy with patient and patient confirmed [facility] on [address] in [place]. Refills sent in. Nothing further needed

## 2022-02-15 ENCOUNTER — Other Ambulatory Visit: Payer: Medicare Other

## 2022-02-18 ENCOUNTER — Ambulatory Visit: Payer: Medicare Other | Admitting: Internal Medicine

## 2022-02-18 NOTE — Progress Notes (Deleted)
Joan [name], female    DOB: [birth year]   MRN: [id number]   Brief patient profile:  52 yobf actively smoking with dx of "asthma all her life "  referred to pulmonary clinic 01/07/2022 by [facility]ad p 2 admits  Worse when arrived in [place] From [place]   Worse also L arm paralysis in 2014    [facility] eval 10/28/21 Assessment & Plan:  Acute hypoxemic respiratory failure and in the setting of bilateral alveolar infiltrates, superimposed on her asthma/COPD.  Review of imaging shows that she has had similar bilateral infiltrates with associated lymphadenopathy for several years.  Differential diagnosis here includes tobacco related pneumonitis such as RB-ILD, DIP.  Other possibilities include sarcoidosis, autoimmune induced interstitial lung disease, eosinophilic pneumonia (history of asthma), hypersensitivity pneumonitis or other exposure such as occupational or illicit substances.  RB-ILD and/or DIP would seem to be the most likely explanation.  Need to consider superimposed cardiogenic pulmonary edema, prior echocardiogram with a possible ASD but intact left and right heart function.   Recommendations: -Repeat ABG reveals adequate ventilation.  No clear indication for BiPAP at this time. -Ultimately patient needs a bronchoscopy and EBUS with transbronchial biopsies, nodal biopsies and BAL for culture data, cell count, CD4/CD8 ratio.  Unfortunately probably a poor candidate for bronchoscopy currently given her current oxygen needs.  We will need to push for this once acute respiratory status improves. -Agree with empiric diuresis as she can tolerate -Given her degree of respiratory failure agree with empiric corticosteroids, although improvement in her infiltrates may actually impact utility of diagnostics (bronchoscopy etc.) -Sputum culture now for bacterial, AFB organisms, eosinophil count -Autoimmune labs: RF, anti-Jo1 antibody, anti-DNA antibody, CCP, SSA/B, ANCA, ANA, ACE level, HSP panel> all WNL x for  RA 18  -Repeat echocardiogram with a bubble study to better evaluate current LV function, RV function, possible shunt>  WNL -Definitive treatment for DIP or RB-ILD is smoking cessation.  Will be critical for her to stop. -Stop Dulera as is redundant with DuoNeb/Pulmicort nebs.    FOB mcQuaid 10/31/21 neg tbbx / neg ebus/ only pos = RA 18 (14 upper nl)    History of Present Illness  01/07/2022  Pulmonary/ 1st office eval/Joan [name] / still smoking and on ACEi  Chief Complaint  Patient presents with   Pulmonary Consult    Referred by Dr York Ram. Pt with hypoxia. Pt c/o SOB "when I catch a cold" or she is exposed to strong smells.   Dyspnea:  limited by back at work at [employer] / unless flaring not having limiting doe  Cough: none now Sleep: worse at hs  - feels like smothering immediately so sits up at least 45 degrees SABA use: one day prior  02  at 3lpm hs and prn  Rec Pantoprazole (protonix) 40 mg   Take  30-60 min before first meal of the day and Pepcid (famotidine)  20 mg after supper until return to office  Stop lisinopril and start olmesartan 20 mg one daily  in its place  The key is to stop smoking completely before smoking completely stops you! Only use your albuterol as a rescue medication    Add: Needs pfts and hrct by return    02/18/2022  f/u ov/Joan [name] re: infiltrates/ ACEi case?   maint on ***  No chief complaint on file.   Dyspnea:  *** Cough: *** Sleeping: *** SABA use: *** 02: *** Covid status:   ***   No obvious day to day or  daytime variability or assoc excess/ purulent sputum or mucus plugs or hemoptysis or cp or chest tightness, subjective wheeze or overt sinus or hb symptoms.   *** without nocturnal  or early am exacerbation  of respiratory  c/o's or need for noct saba. Also denies any obvious fluctuation of symptoms with weather or environmental changes or other aggravating or alleviating factors except as outlined above   No unusual exposure hx or h/o  childhood pna/ asthma or knowledge of premature birth.  Current Allergies, Complete Past Medical History, Past Surgical History, Family History, and Social History were reviewed in [employer] record.  ROS  The following are not active complaints unless bolded Hoarseness, sore throat, dysphagia, dental problems, itching, sneezing,  nasal congestion or discharge of excess mucus or purulent secretions, ear ache,   fever, chills, sweats, unintended wt loss or wt gain, classically pleuritic or exertional cp,  orthopnea pnd or arm/hand swelling  or leg swelling, presyncope, palpitations, abdominal pain, anorexia, nausea, vomiting, diarrhea  or change in bowel habits or change in bladder habits, change in stools or change in urine, dysuria, hematuria,  rash, arthralgias, visual complaints, headache, numbness, weakness or ataxia or problems with walking or coordination,  change in mood or  memory.        No outpatient medications have been marked as taking for the 02/18/22 encounter (Appointment) with Tanda Rockers, MD.                   Past Medical History:  Diagnosis Date   Anemia    Anxiety    Asthma    Chronic back pain    Chronic neck pain    Complex regional pain syndrome    COPD (chronic obstructive pulmonary disease) ([facility])    GERD (gastroesophageal reflux disease)    Hematuria 09/07/2013   Hypertension    IBS (irritable bowel syndrome)    Migraine    Paresthesia of left arm       Objective:    Wt Readings from Last 3 Encounters:  01/07/22 208 lb 12.8 oz (94.7 kg)  10/27/21 208 lb 1.8 oz (94.4 kg)  06/03/21 219 lb 2.2 oz (99.4 kg)      Vital signs reviewed  02/18/2022  - Note at rest 02 sats  ***% on ***   General appearance:    ***      I personally reviewed images and agree with radiology impression as follows:   Chest CTa 10/27/21 1. No pulmonary embolus. 2. Chronic diffuse peribronchovascular ground-glass airspace opacities with  interlobular septal wall thickening. Differential diagnosis includes pulmonary edema, infection (bacterial or fungal), pneumonitis, sarcoidosis, pulmonary alveolar proteinosis. 3. Persistent likely reactive hilar and mediastinal lymphadenopathy. Recommend attention on follow-up.    Assessment

## 2022-02-22 ENCOUNTER — Telehealth: Payer: Self-pay | Admitting: Internal Medicine

## 2022-02-22 NOTE — Telephone Encounter (Signed)
Received fax from [facility] stating pt with increased respiratory symptoms since switched from lisinopril to benicar. Spoke with the pt. She states since changing meds she has had increased cough with clear sputum and more SOB than usual. No fevers, aches, wheezing, CP or other new symptoms. She is using her rescue inhaler several times per day. She still has Lisinopril and I advised okay to start back on this and d/c benicar for now. Will forward to Dr Melvyn Novas for additional recs. Thanks!

## 2022-02-22 NOTE — Telephone Encounter (Signed)
ATC mailbox is full.

## 2022-02-22 NOTE — Telephone Encounter (Signed)
Looks like she missed last appt so needs f/u asap (NP OK) with all active meds in hand to regroup as ARB should not have made her cough worse than it was on ACEi

## 2022-02-25 NOTE — Telephone Encounter (Signed)
Tried calling the pt and there was no answer, line rings and then busy tone.

## 2022-03-12 ENCOUNTER — Inpatient Hospital Stay: Admission: RE | Admit: 2022-03-12 | Payer: Medicare Other | Source: Ambulatory Visit

## 2022-08-13 ENCOUNTER — Emergency Department (HOSPITAL_COMMUNITY)
Admission: EM | Admit: 2022-08-13 | Discharge: 2022-08-13 | Discharge status: Against Medical Advice | Payer: Medicare Other | Attending: Emergency Medicine | Admitting: Emergency Medicine

## 2022-08-13 DIAGNOSIS — R062 Wheezing: Secondary | ICD-10-CM | POA: Diagnosis present

## 2022-08-13 DIAGNOSIS — R55 Syncope and collapse: Secondary | ICD-10-CM | POA: Insufficient documentation

## 2022-08-13 DIAGNOSIS — Z5321 Procedure and treatment not carried out due to patient leaving prior to being seen by health care provider: Secondary | ICD-10-CM | POA: Diagnosis not present

## 2022-08-13 NOTE — ED Triage Notes (Signed)
BIB EMS from home. Family found pt unresponsive and not breathing. Fire department performed rescue breathing, pt refused transport to hospital during that time. 30 minutes later 911 was called again and transported to hospital. Per EMS pt has audibile wheezing, and episodes of syncope during conversations. AOx4 when awake.  At this time during triage patient is walking out. Unable to complete triage.   EMS vitals: 137/70, 70, 100% [place], 111CBG

## 2022-08-13 NOTE — ED Notes (Signed)
Pt arrived with EMS, upset that she was placed in a hallway bed. Pt demanding to leave and started ambulating down the hall to the lobby

## 2023-02-13 IMAGING — DX DG CHEST 1V PORT
1 series · 1 of 1 positions shown · non-contrast
Comparison: 02/17/2021

CLINICAL DATA: Chest pain and shortness of breath

EXAM:
PORTABLE CHEST 1 VIEW

[chest]
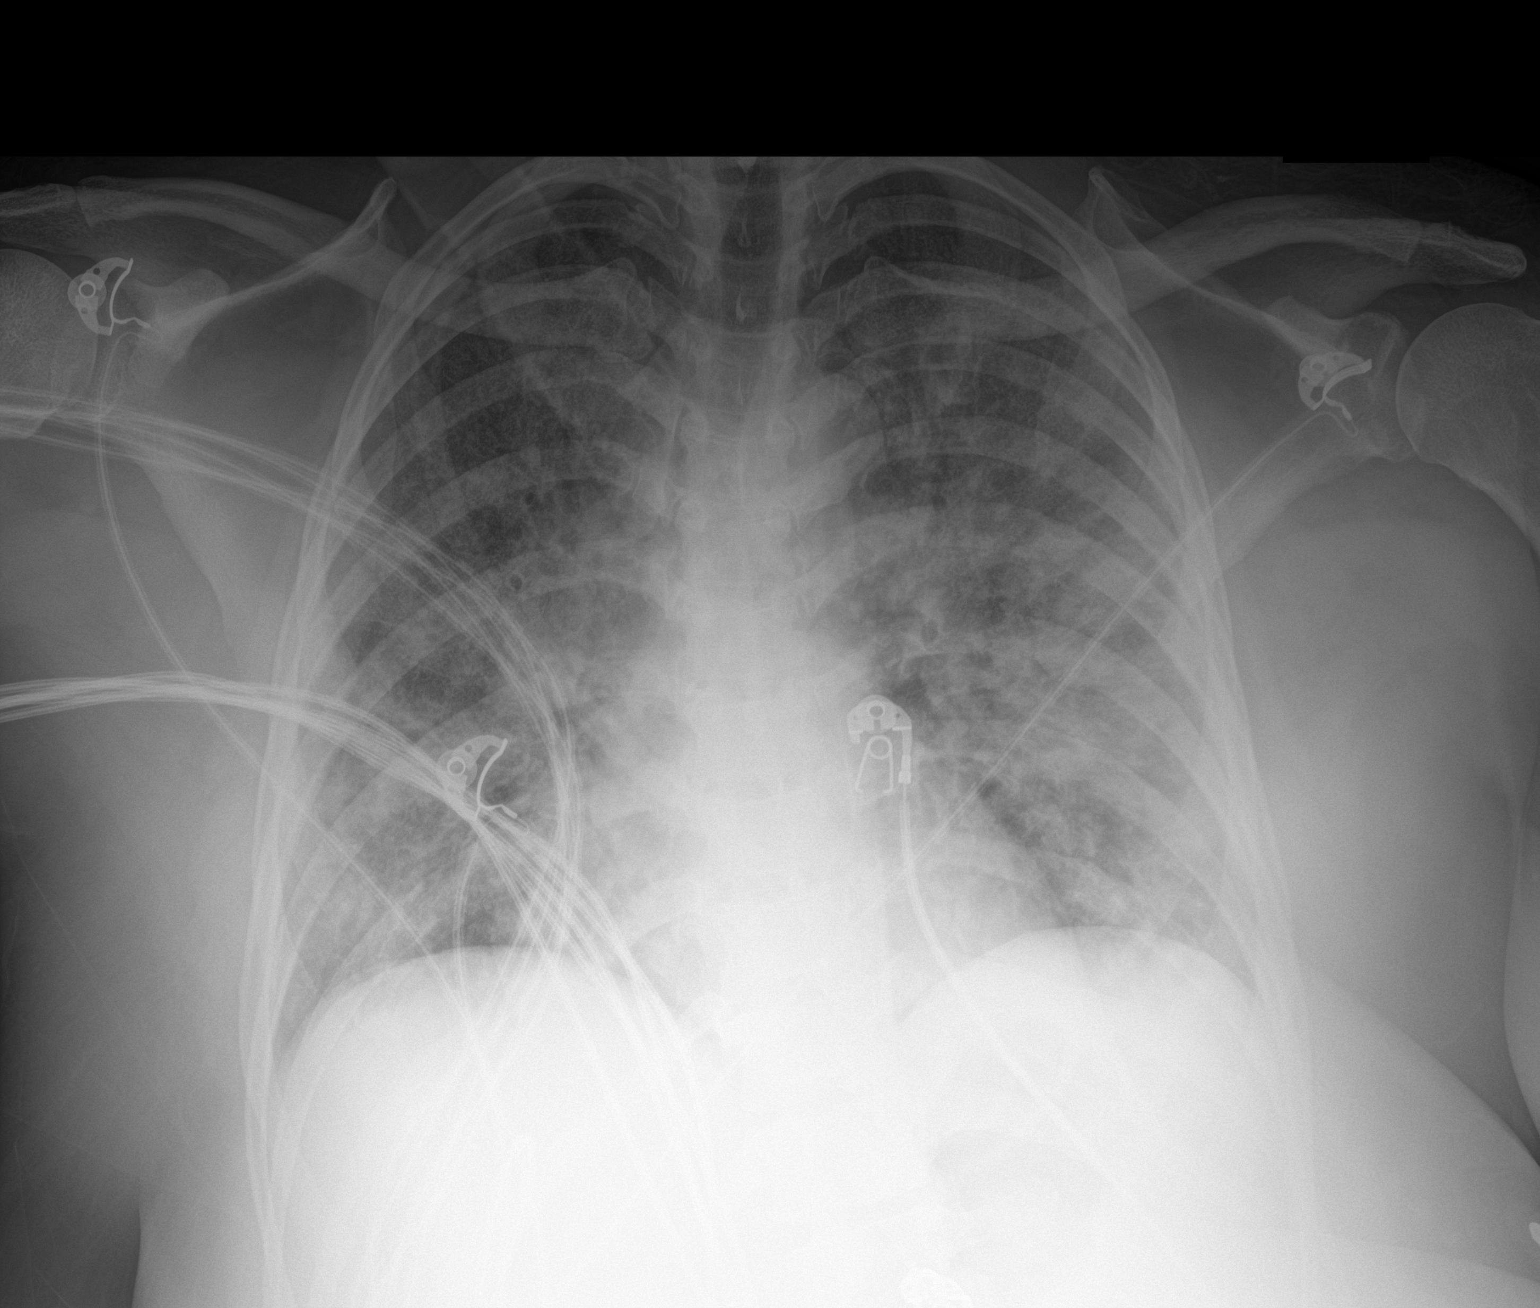

[1 of 1 positions shown; findings below may reference images not displayed]

FINDINGS: Normal heart size. Diffuse bilateral airspace disease throughout
both lungs, more prominent on the left. No pleural effusions. No
pneumothorax. Mediastinal contours appear intact.
IMPRESSION: Bilateral pulmonary parenchymal infiltrates, similar to prior study.

## 2023-02-14 IMAGING — CT CT CHEST W/O CM
2 of 3 series · 15 of 36 positions shown, 18 images · non-contrast
Comparison: Chest CTA 03/16/2020.

CLINICAL DATA: 55-year-old female with history of unresolved
pneumonia. Suspected DIP in active chronic smoker.

EXAM:
CT CHEST WITHOUT CONTRAST
TECHNIQUE: Multidetector CT imaging of the chest was performed following the
standard protocol without IV contrast.

[Series 3: chest w/o 2mm st · axial · non-contrast · 0.76mm/px · z∈[+1129,+1407]mm · 12 of 165 slices shown, 15 images]
[im 13/165  mediastinal]
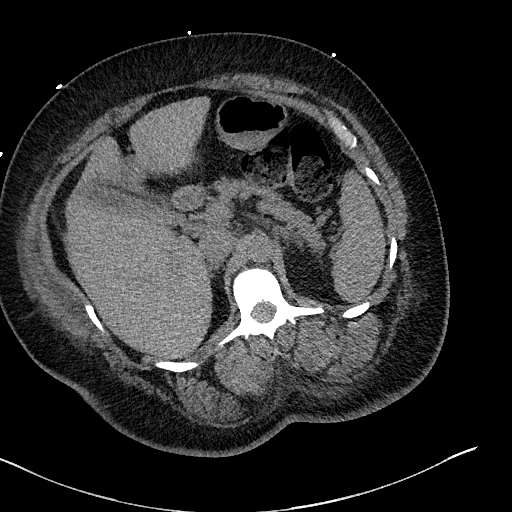
[im 13/165  lung]
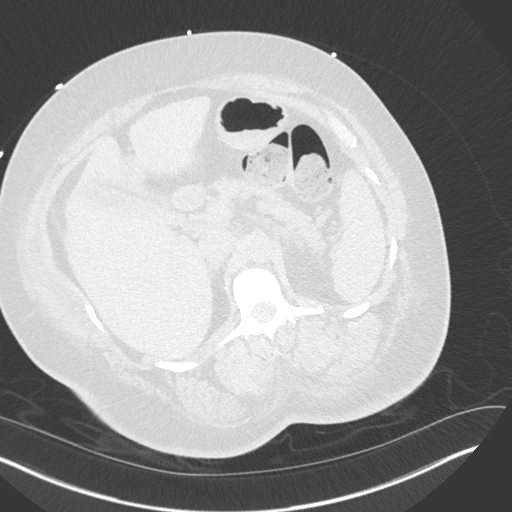
[im 25/165  lung]
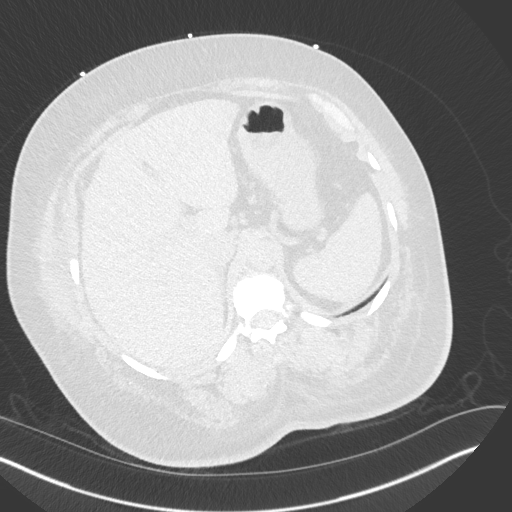
[im 37/165  lung]
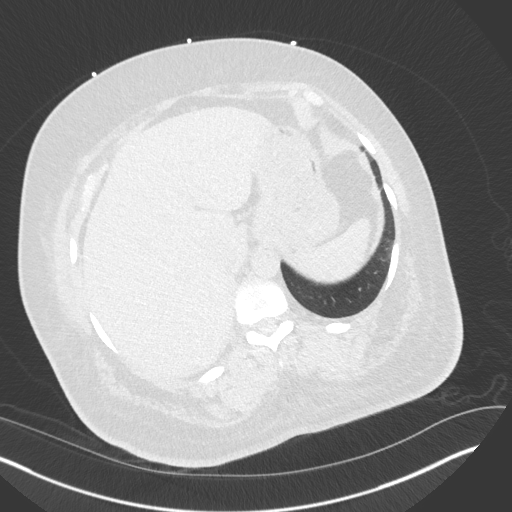
[im 49/165  lung]
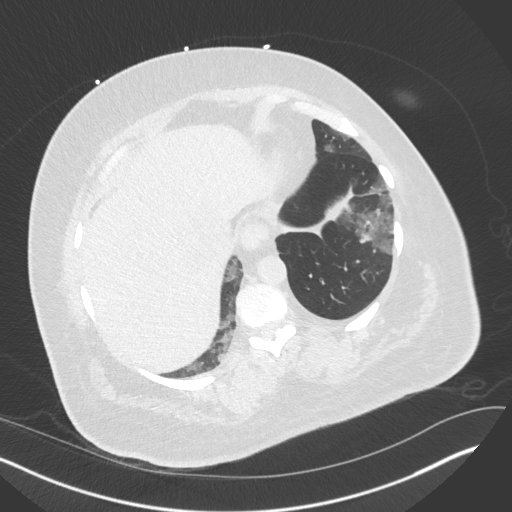
[im 61/165  mediastinal]
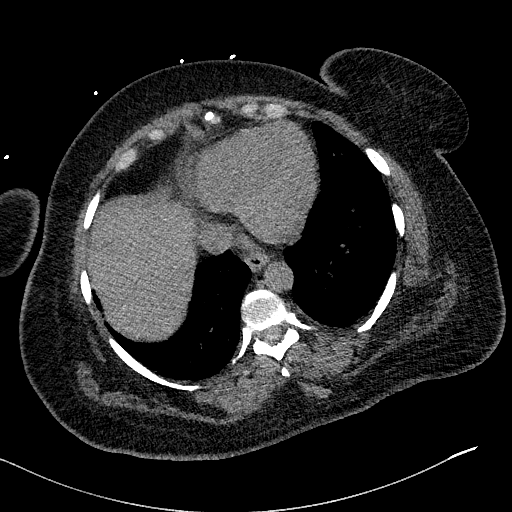
[im 61/165  lung]
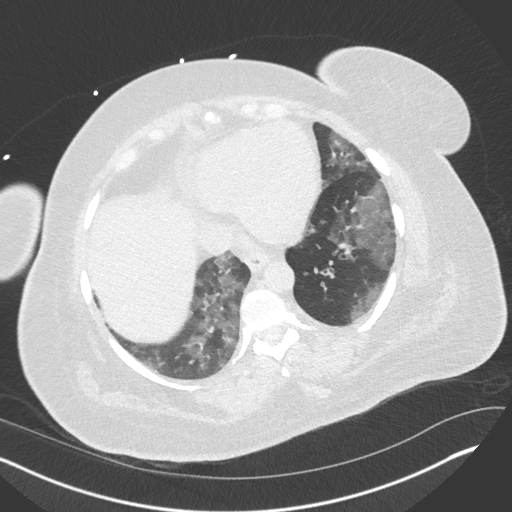
[im 73/165  lung]
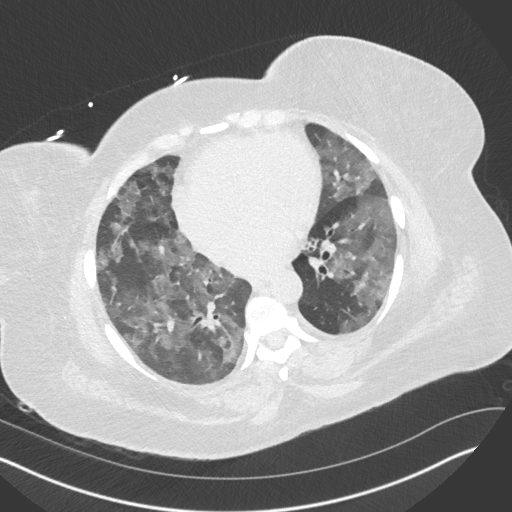
[im 92/165  lung]
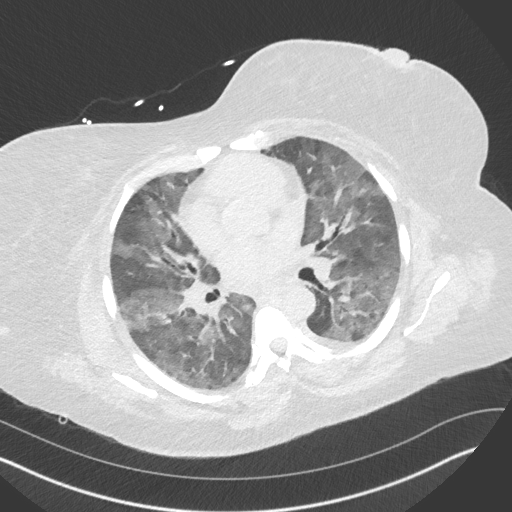
[im 104/165  lung]
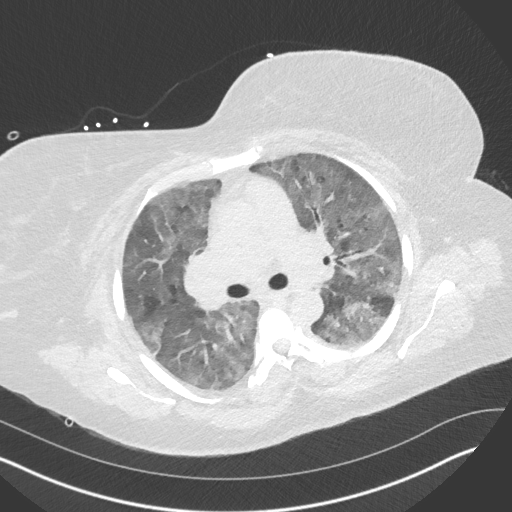
[im 116/165  mediastinal]
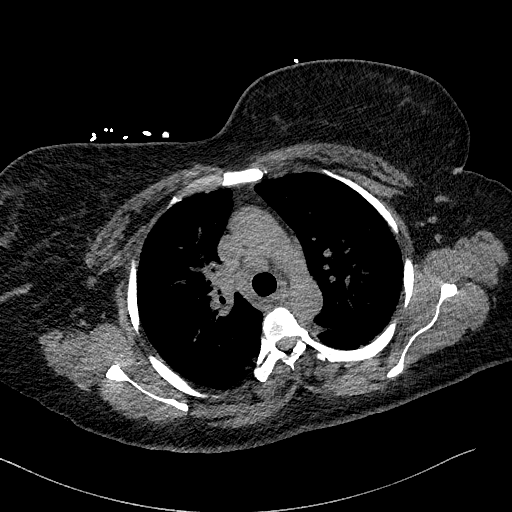
[im 116/165  lung]
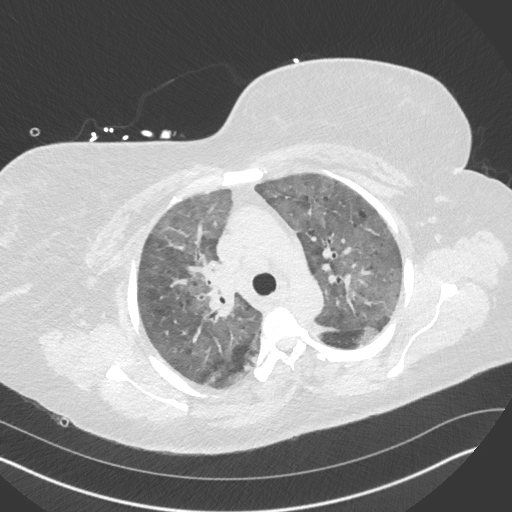
[im 128/165  lung]
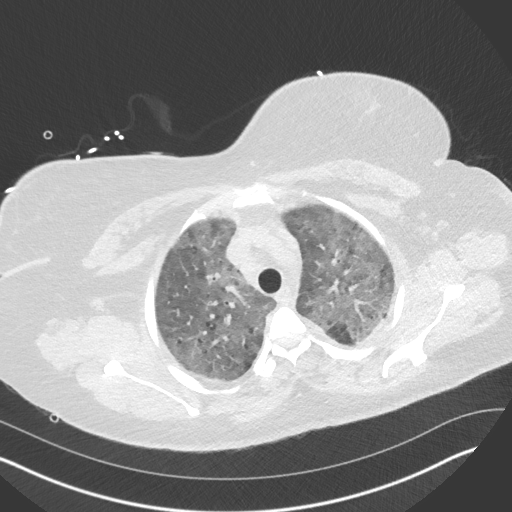
[im 140/165  lung]
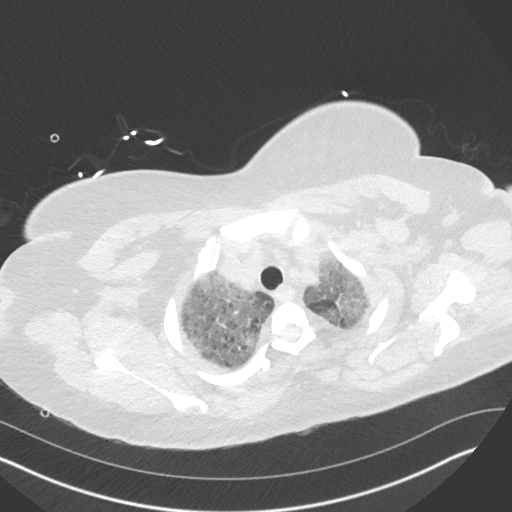
[im 152/165  lung]
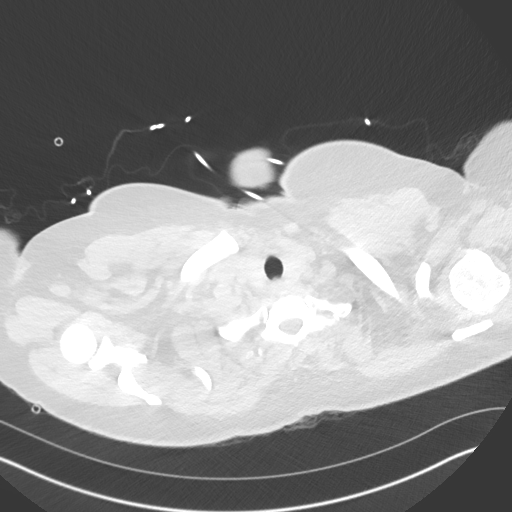

[Series 6: chest w/o 2mm st cor · coronal · non-contrast · 0.64mm/px · 3 of 151 slices shown]
[im 31/151  lung]
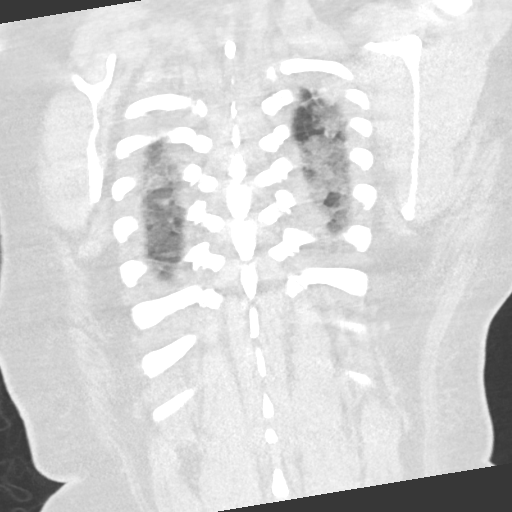
[im 61/151  lung]
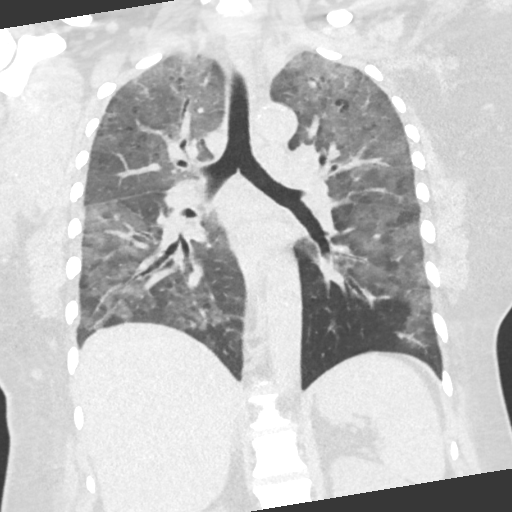
[im 91/151  lung]
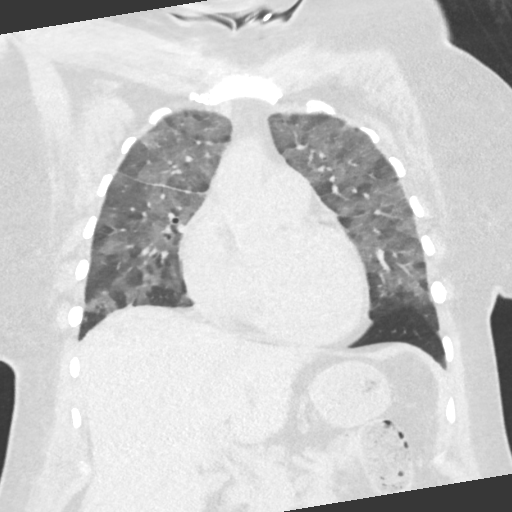

[15 of 36 positions shown; findings below may reference images not displayed]

FINDINGS: Cardiovascular: Heart size is normal. There is no significant
pericardial fluid, thickening or pericardial calcification. Aortic
atherosclerosis. No definite coronary artery calcifications.

Mediastinum/Nodes: Multiple prominent borderline enlarged and mildly
enlarged mediastinal and bilateral hilar lymph nodes are noted,
measuring up to 1.5 cm in short axis in the low right and left
paratracheal nodal stations, similar to prior examinations,
presumably chronic and reactive. Esophagus is unremarkable in
appearance. No axillary lymphadenopathy.

Lungs/Pleura: High-resolution imaging was not performed. With these
limitations in mind, there are again widespread areas of
ground-glass attenuation scattered throughout the lungs bilaterally,
most severe throughout the mid to upper lungs with relative sparing
in some portions of the lung bases. Scattered areas of mild
interlobular septal thickening are noted. No significant subpleural
reticulation, traction bronchiectasis or honeycombing. Overall,
findings appear less severe (less confluent consolidation and more
ground-glass attenuation) than prior studies from 6763, and are even
slightly less severe when compared to more remote prior examination
from 8494, although the overall distribution and general spectrum of
findings is very similar.

Upper Abdomen: Unremarkable.

Musculoskeletal: There are no aggressive appearing lytic or blastic
lesions noted in the visualized portions of the skeleton.
IMPRESSION: 1. The appearance of the lungs is unusual, but is suggestive of
interstitial lung disease, as detailed above. DIP warrants
consideration, however, the distribution would be unusual for DIP
given the areas of relative basal sparing. Other differential
considerations are broad, and include potential eosinophilic
pneumonia or lymphoid interstitial pneumonia (LIP). Given the
chronicity of these findings, consideration for open lung biopsy to
establish a tissue diagnosis is recommended. Outpatient referral to
Pulmonology for further clinical evaluation, and follow-up
nonemergent high-resolution chest CT is also recommended.

## 2023-11-02 ENCOUNTER — Emergency Department (HOSPITAL_COMMUNITY): Payer: 59

## 2023-11-02 ENCOUNTER — Encounter (HOSPITAL_COMMUNITY): Payer: Self-pay

## 2023-11-02 ENCOUNTER — Inpatient Hospital Stay (HOSPITAL_COMMUNITY)
Admission: EM | Admit: 2023-11-02 | Discharge: 2023-11-04 | DRG: 091 | Discharge status: Against Medical Advice | Payer: 59 | Attending: Pulmonary Disease | Admitting: Pulmonary Disease

## 2023-11-02 DIAGNOSIS — J449 Chronic obstructive pulmonary disease, unspecified: Secondary | ICD-10-CM

## 2023-11-02 DIAGNOSIS — G9340 Encephalopathy, unspecified: Secondary | ICD-10-CM | POA: Diagnosis not present

## 2023-11-02 DIAGNOSIS — E876 Hypokalemia: Secondary | ICD-10-CM | POA: Diagnosis present

## 2023-11-02 DIAGNOSIS — J9621 Acute and chronic respiratory failure with hypoxia: Secondary | ICD-10-CM | POA: Diagnosis present

## 2023-11-02 DIAGNOSIS — Z79899 Other long term (current) drug therapy: Secondary | ICD-10-CM

## 2023-11-02 DIAGNOSIS — F17210 Nicotine dependence, cigarettes, uncomplicated: Secondary | ICD-10-CM | POA: Diagnosis present

## 2023-11-02 DIAGNOSIS — T402X5A Adverse effect of other opioids, initial encounter: Secondary | ICD-10-CM | POA: Diagnosis present

## 2023-11-02 DIAGNOSIS — G40909 Epilepsy, unspecified, not intractable, without status epilepticus: Secondary | ICD-10-CM

## 2023-11-02 DIAGNOSIS — Z79891 Long term (current) use of opiate analgesic: Secondary | ICD-10-CM

## 2023-11-02 DIAGNOSIS — G928 Other toxic encephalopathy: Principal | ICD-10-CM | POA: Diagnosis present

## 2023-11-02 DIAGNOSIS — F419 Anxiety disorder, unspecified: Secondary | ICD-10-CM | POA: Diagnosis present

## 2023-11-02 DIAGNOSIS — Z8249 Family history of ischemic heart disease and other diseases of the circulatory system: Secondary | ICD-10-CM

## 2023-11-02 DIAGNOSIS — G894 Chronic pain syndrome: Secondary | ICD-10-CM | POA: Diagnosis present

## 2023-11-02 DIAGNOSIS — K219 Gastro-esophageal reflux disease without esophagitis: Secondary | ICD-10-CM | POA: Diagnosis present

## 2023-11-02 DIAGNOSIS — Z833 Family history of diabetes mellitus: Secondary | ICD-10-CM

## 2023-11-02 DIAGNOSIS — I10 Essential (primary) hypertension: Secondary | ICD-10-CM | POA: Diagnosis present

## 2023-11-02 DIAGNOSIS — R569 Unspecified convulsions: Secondary | ICD-10-CM | POA: Diagnosis not present

## 2023-11-02 DIAGNOSIS — J4489 Other specified chronic obstructive pulmonary disease: Secondary | ICD-10-CM | POA: Diagnosis present

## 2023-11-02 DIAGNOSIS — T402X4A Poisoning by other opioids, undetermined, initial encounter: Principal | ICD-10-CM

## 2023-11-02 DIAGNOSIS — F32A Depression, unspecified: Secondary | ICD-10-CM | POA: Diagnosis present

## 2023-11-02 LAB — CBC
HCT: 42.5 % (ref 36.0–46.0)
Hemoglobin: 13.7 g/dL (ref 12.0–15.0)
MCH: 27.5 pg (ref 26.0–34.0)
MCHC: 32.2 g/dL (ref 30.0–36.0)
MCV: 85.3 fL (ref 80.0–100.0)
Platelets: 230 10*3/uL (ref 150–400)
RBC: 4.98 MIL/uL (ref 3.87–5.11)
RDW: 14.3 % (ref 11.5–15.5)
WBC: 13 10*3/uL — ABNORMAL HIGH (ref 4.0–10.5)
nRBC: 0 % (ref 0.0–0.2)

## 2023-11-02 LAB — ETHANOL: Alcohol, Ethyl (B): <10 mg/dL (ref <10)

## 2023-11-02 LAB — COMPREHENSIVE METABOLIC PANEL
ALT: 14 U/L (ref 0–44)
AST: 33 U/L (ref 15–41)
Albumin: 3.2 g/dL — ABNORMAL LOW (ref 3.5–5.0)
Alkaline Phosphatase: 65 U/L (ref 38–126)
Anion gap: 11 (ref 5–15)
BUN: 11 mg/dL (ref 6–20)
CO2: 28 mmol/L (ref 22–32)
Calcium: 9.1 mg/dL (ref 8.9–10.3)
Chloride: 99 mmol/L (ref 98–111)
Creatinine, Ser: 1.07 mg/dL — ABNORMAL HIGH (ref 0.44–1.00)
GFR, Estimated: >60 mL/min (ref >60)
Glucose, Bld: 97 mg/dL (ref 70–99)
Potassium: 3.1 mmol/L — ABNORMAL LOW (ref 3.5–5.1)
Sodium: 138 mmol/L (ref 135–145)
Total Bilirubin: 0.8 mg/dL (ref 0.0–1.2)
Total Protein: 7.2 g/dL (ref 6.5–8.1)

## 2023-11-02 LAB — HCG, SERUM, QUALITATIVE: Preg, Serum: NEGATIVE

## 2023-11-02 LAB — TROPONIN I (HIGH SENSITIVITY): Troponin I (High Sensitivity): 5 ng/L (ref <18)

## 2023-11-02 MED ORDER — NALOXONE HCL 2 MG/2ML IJ SOSY
2.0000 mg | PREFILLED_SYRINGE | Freq: Once | INTRAMUSCULAR | Status: AC
  Administered 2023-11-02: 2 mg via INTRAVENOUS

## 2023-11-02 MED ORDER — ONDANSETRON HCL 4 MG/2ML IJ SOLN
4.0000 mg | Freq: Once | INTRAMUSCULAR | Status: AC
  Administered 2023-11-02: 4 mg via INTRAVENOUS

## 2023-11-02 MED ORDER — NALOXONE HCL 0.4 MG/ML IJ SOLN
0.4000 mg | Freq: Once | INTRAMUSCULAR | Status: AC
  Administered 2023-11-02: 0.4 mg via INTRAVENOUS
  Filled 2023-11-02: qty 1

## 2023-11-02 NOTE — ED Notes (Signed)
CBG 95 

## 2023-11-02 NOTE — Consult Note (Addendum)
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 Apparently was altered, repeating questions and not acting like her self. With EMS initially GCS 15 then abruptly less responsive; associated decr RR and hypoxia requiring BVM. Received narcan in ED with modest transient improvement. An hour later received a second dose of narcan but sounds like she was pretty quickly back to responding only to sternal rub.   PCCM consulted for admission in setting of possible narcan gtt need.   On my arrival she is returning from CT Head, these images are not yet visible.  She wakes up easily for me and per family and nursing what I am able to glean is the most alert/coherent she has been by a good margin. She is holding conversations, Oriented x3. Says she has missed some of her home sz medicines, was scared she would run out. Endorsed a seizure last night and again this morning. Didn't want to take her gabapentin because it makes her feel badly and she was supposed to start a new job.  Denies taking more opiates than what is Rx. Family feels like she is usually very careful/diligent with her meds.   Labs are grossly unremarkable -- slightly low K slightly hight Cr and WBC.    Pertinent  Medical History  Sz Chronic pain Opiate dependence  Asthma  Significant Hospital Events: Including procedures, antibiotic start and stop dates in addition to other pertinent events   2/16 PCCM consulted for possible narcan gtt after r2x doses of narcan. Markedly improved at time of our eval. CT H without acute abnormalities  Interim History / Subjective:   Returns from CT H    Objective   Blood pressure (!) 143/91, pulse 95, temperature (!) 97.5 F (36.4 C),  temperature source Temporal, resp. rate (!) 42, height 5\' 6"  (1.676 m), weight 95 kg, SpO2 100%.       No intake or output data in the 24 hours ending 11/02/23 2339 Filed Weights   11/02/23 2127  Weight: 95 kg    Examination: General: Chronically ill appearing WDWN adult F NAD HENT: NCAT. NRB and EtCO2 in place. Anicteric sclera Lungs: CTA.  Cardiovascular: rr s1s2  Abdomen: soft ndnt  Extremities: no acute joint deformity Neuro: Woke up easily. Following commands. Oriented x3.  GU: defer  Resolved Hospital Problem list     Assessment & Plan:   Acute encephalopathy, improving  Hx seizures, ? Breakthrough seizure and post-ictal state CRPS with opiate dependence, ? Accidental over-ingestion?  -CT H without acute abnormality -Does endorse not taking her AEDs as Rx however -- says she was scared she would run out but couldn't tell me which she has been sparing -- and also endorses 1 sz 2/15 night and 2/16 day, and not wanting to take seizure meds with these because they make her feel poorly  -denies taking more pain meds than Rx. Did receive narcan in ED x2. Some improvement w this but the timeline for her dramatic improvement doesn't align well w narcan administration, so not sure if this is really the culprit.  If she did have sz and confusion at home, suppose it is possible she could have taken add'l pain meds by mistake  P -stable for admission outside the ICU, to medicine -- unless her mentation changes -no need for narcan gtt right now.  -check VPA and topiramate levels -starting her home AEDs (some of which could be Rx for pain) are VPA (500mg  daily), topamax (25 mg daily PRN for sz -- ordered this as daily given her 2x sz in the last 12 hr, not PRN ) gabapentin (600mg  QID) and baclofen (10mg  TID)  - I have asked pharmacy to do a med rec and help clarify her home meds (specifically AEDs and pain meds) -- if some of these are not active/current, will need dc.  -consider neuro  consult tonight vs AM (defer to admitting provider, but think can probably be AM consult) -sz precautions -I have ordered a routine spot EEG -UDS  -I have not ordered home opiates  -PRN narcan   Hypokalemia -replace K + 2g mag    Best Practice (right click and "Reselect all SmartList Selections" daily)   Full Code Son Dre updated at bedside 2/17  NPO sips with meds SQ Heparin No lines No foley   Labs   CBC: Recent Labs  Lab 11/02/23 2133  WBC 13.0*  HGB 13.7  HCT 42.5  MCV 85.3  PLT 230    Basic Metabolic Panel: Recent Labs  Lab 11/02/23 2133  NA 138  K 3.1*  CL 99  CO2 28  GLUCOSE 97  BUN 11  CREATININE 1.07*  CALCIUM 9.1   GFR: Estimated Creatinine Clearance: 69 mL/min (A) (by C-G formula based on SCr of 1.07 mg/dL (H)). Recent Labs  Lab 11/02/23 2133  WBC 13.0*    Liver Function Tests: Recent Labs  Lab 11/02/23 2133  AST 33  ALT 14  ALKPHOS 65  BILITOT 0.8  PROT 7.2  ALBUMIN 3.2*   No results for input(s): "LIPASE", "AMYLASE" in the last 168 hours. No results for input(s): "AMMONIA" in the last 168 hours.  ABG    Component Value Date/Time   PHART 7.433 10/28/2021 1342   PCO2ART 43.5 10/28/2021 1342   PO2ART 65.4 (L) 10/28/2021 1342   HCO3 28.8 (H) 10/28/2021 1342   TCO2 28 10/27/2021 2054   ACIDBASEDEF 0.2 05/03/2018 0015   O2SAT 93.5 10/28/2021 1342     Coagulation Profile: No results for input(s): "INR", "PROTIME" in the last 168 hours.  Cardiac Enzymes: No results for input(s): "CKTOTAL", "CKMB", "CKMBINDEX", "TROPONINI" in the last 168 hours.  HbA1C: No results found for: "HGBA1C"  CBG: No results for input(s): "GLUCAP" in the last 168 hours.  Review of Systems:   Pain Confusion Tiredness  - SI - Depression  - substance use   Past Medical History:  She,  has a past medical history of Anemia, Anxiety, Asthma, Chronic back pain, Chronic neck pain, Complex regional pain syndrome, COPD (chronic obstructive  pulmonary disease) (HCC), GERD (gastroesophageal reflux disease), Hematuria (09/07/2013), Hypertension, IBS (irritable bowel syndrome), Migraine, and Paresthesia of left arm.   Surgical History:   Past Surgical History:  Procedure Laterality Date   BIOPSY  10/31/2021   Procedure: BIOPSY;  Surgeon: Lupita Leash, MD;  Location: [facility] ENDOSCOPY;  Service: Cardiopulmonary;;   BRONCHIAL NEEDLE ASPIRATION BIOPSY  10/31/2021   Procedure: BRONCHIAL NEEDLE ASPIRATION BIOPSIES;  Surgeon: Lupita Leash, MD;  Location: [facility] ENDOSCOPY;  Service: Cardiopulmonary;;   BRONCHIAL WASHINGS  10/31/2021   Procedure: BRONCHIAL WASHINGS;  Surgeon: Lupita Leash, MD;  Location: [facility] ENDOSCOPY;  Service: Cardiopulmonary;;   DILITATION & CURRETTAGE/HYSTROSCOPY WITH THERMACHOICE ABLATION  N/A 08/17/2013   Procedure: DILATATION & CURETTAGE/HYSTEROSCOPY WITH THERMACHOICE ABLATION;  Surgeon: Tilda Burrow, MD;  Location: [facility] ORS;  Service: Gynecology;  Laterality: N/A;  18 ml D5W in & out; total therapy time= 9 minutes 33 seconds; temp= 87 degrees celcius   HERNIA REPAIR     ventral hernia   POLYPECTOMY N/A 08/17/2013   Procedure: REMOVAL ENDOMETRIAL POLYP;  Surgeon: Tilda Burrow, MD;  Location: [facility] ORS;  Service: Gynecology;  Laterality: N/A;   TUBAL LIGATION     ULNAR NERVE REPAIR Left    [facility] 2013   VIDEO BRONCHOSCOPY WITH ENDOBRONCHIAL ULTRASOUND N/A 10/31/2021   Procedure: VIDEO BRONCHOSCOPY WITH ENDOBRONCHIAL ULTRASOUND;  Surgeon: Lupita Leash, MD;  Location: [facility] ENDOSCOPY;  Service: Cardiopulmonary;  Laterality: N/A;     Social History:   reports that she has been smoking cigarettes. She has a 26 pack-year smoking history. She has never used smokeless tobacco. She reports that she does not drink alcohol and does not use drugs.   Family History:  Her family history includes Cancer in her maternal aunt, maternal grandmother, and sister; Diabetes in her brother and sister; Heart disease in her brother  and sister; Hypertension in her sister; Other in her mother.   Allergies Allergies  Allergen Reactions   Gabapentin Other (See Comments) and Hypertension    Elevated BP requiring doubling of current meds   Other Other (See Comments)    Unnamed IV meds for migraine, patient stated it made her feel like "something was crawling all over" her body     Home Medications  Prior to Admission medications   Medication Sig Start Date End Date Taking? Authorizing Provider  albuterol (PROVENTIL) (2.5 MG/3ML) 0.083% nebulizer solution Take 3 mLs (2.5 mg total) by nebulization every 6 (six) hours as needed for wheezing or shortness of breath. 03/18/20 01/07/22  Lanae Boast, MD  albuterol (VENTOLIN HFA) 108 (90 Base) MCG/ACT inhaler Inhale 2 puffs into the lungs every 4 (four) hours as needed for wheezing or shortness of breath. 02/05/22   Nyoka Cowden, MD  ALPRAZolam Prudy Feeler) 1 MG tablet Take 1 mg by mouth 3 (three) times daily.    [provider]  baclofen (LIORESAL) 10 MG tablet Take 10 mg by mouth in the morning, at noon, and at bedtime. Morning, afternon, and    [provider]  divalproex (DEPAKOTE) 500 MG DR tablet Take 1 tablet (500 mg total) by mouth daily. 06/07/21   Joseph Art, DO  DULoxetine (CYMBALTA) 60 MG capsule Take 1 capsule (60 mg total) by mouth in the morning and at bedtime. 06/07/21   Joseph Art, DO  famotidine (PEPCID) 20 MG tablet One after supper 01/07/22   Nyoka Cowden, MD  gabapentin (NEURONTIN) 300 MG capsule Take 600 mg by mouth 4 (four) times daily. 05/14/16   [provider]  hydrochlorothiazide (HYDRODIURIL) 50 MG tablet Take 1 tablet (50 mg total) by mouth daily. 06/07/21   Joseph Art, DO  olmesartan (BENICAR) 20 MG tablet Take 1 tablet (20 mg total) by mouth daily. 01/07/22   Nyoka Cowden, MD  oxyCODONE (ROXICODONE) 15 MG immediate release tablet Take 15 mg by mouth 4 (four) times daily.    [provider]  pantoprazole  (PROTONIX) 40 MG tablet Take 1 tablet (40 mg total) by mouth daily. 10/31/21 01/29/22  [place], Alvira Philips, DO  topiramate (TOPAMAX) 25 MG tablet Take 25 mg by mouth daily as needed (for more than 2  seizures a day). 11/11/19   [provider]     Critical care time: na       High mdm   Tessie Fass MSN, AGACNP-BC [facility] Pulmonary/Critical Care Medicine Amion for pager  11/03/2023, 12:18 AM

## 2023-11-02 NOTE — ED Notes (Signed)
Pt taken off NRB and placed back 3L [place], pt holding a conversation with NP at bedside and family. Alert and following commands

## 2023-11-02 NOTE — ED Triage Notes (Signed)
Pt arrived from home, BIB [facility] for AMS, confused, repeat, questioning, since 20:45 tonight. 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 No meds in one week. EMS reported on arrival, pt GCS 15, but repeated questions, but then in route pt became unresponsive, apneic breathing, RR 4, sats 82% RA., EMS to assist breathing with BVM, and placed a NPA, but arrived on NRB, otherwise VSS, CBG 97, some reflex to painful stimuli.   EMS vitals  110/70 97 CBG 98% NRB

## 2023-11-02 NOTE — ED Notes (Signed)
Advised to pt and family that Trifan is aware of pt's concern and that he will be at bedside to speak with pt and family regarding her concern and pain, pt shouts "what you mean my concern, my concern is I need my pain medication". Explained to pt that I understood her frustration as she has chronic pain syndrome, pt st "I ain't playing, yall don't need to discuses anything, either I get my pain medication or I will leave and go home". Family is understanding and trying to calm pt while at bedside.

## 2023-11-02 NOTE — ED Notes (Signed)
Pt screaming, thrashing around in bed, "yall took my pain medication out, you need to put it back in me or I am leaving". Advised to pt and family, I will speak with provider, asked pt where her pain was, pt reports left side, pt screaming "I ain't playing with yall, I will leave, give me my pain medication, you took all of it out of me".  Dr. Renaye Rakers at notified.

## 2023-11-02 NOTE — ED Notes (Signed)
Pt placed back on a NRB from 3L [place], d/t decrease sats into the lower 80s, pt responds to sternal rub, request for additional narcan

## 2023-11-02 NOTE — Consult Note (Incomplete)
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 Apparently was altered, repeating questions and not acting like her self. With EMS initially GCS 15 then abruptly less responsive; associated decr RR and hypoxia requiring BVM. Received narcan in ED with modest transient improvement. An hour later received a second dose of narcan but sounds like she was pretty quickly back to responding only to sternal rub.   PCCM consulted for admission in setting of possible narcan gtt need.   On my arrival she is returning from CT Head, these images are not yet visible.  She wakes up easily for me and per family and nursing what I am able to glean is the most alert/coherent she has been by a good margin. She is holding conversations, Oriented x3. Says she has missed some of her home sz medicines, was scared she would run out. Endorsed a seizure last night and again this morning. Didn't want to take her gabapentin because it makes her feel badly and she was supposed to start a new job.  Denies taking more opiates than what is Rx. Family feels like she is usually very careful/diligent with her meds.   Labs are grossly unremarkable -- slightly low K slightly hight Cr and WBC.    Pertinent  Medical History  Sz Chronic pain Opiate dependence  Asthma  Significant Hospital Events: Including procedures, antibiotic start and stop dates in addition to other pertinent events   2/16 PCCM consulted for possible narcan gtt after r2x doses of narcan. Markedly improved at time of our eval. CT H without acute abnormalities  Interim History / Subjective:   Returns from CT H    Objective   Blood pressure (!) 143/91, pulse 95, temperature (!) 97.5 F (36.4 C),  temperature source Temporal, resp. rate (!) 42, height 5\' 6"  (1.676 m), weight 95 kg, SpO2 100%.       No intake or output data in the 24 hours ending 11/02/23 2339 Filed Weights   11/02/23 2127  Weight: 95 kg    Examination: General: Chronically ill appearing WDWN adult F NAD HENT: NCAT. NRB and EtCO2 in place. Anicteric sclera Lungs: CTA.  Cardiovascular: rr s1s2  Abdomen: soft ndnt  Extremities: no acute joint deformity Neuro: Woke up easily. Following commands. Oriented x3.  GU: defer  Resolved Hospital Problem list     Assessment & Plan:   Acute encephalopathy, improving  Hx seizures, ? Breakthrough seizure and post-ictal state CRPS with opiate dependence, ? Accidental over-ingestion?  -CT H without acute abnormality -denies taking more pain meds than Rx. Does endorse not taking her AEDs as Rx however -- says she was scared she would run out but couldn't tell me which she has been sparing-- and also endorses 1 sz 2/15 night and 2/16 day, and not wanting to take AEDs with these because they make her feel poorly -did receive narcan in ED x2. Some improvement w this but the timeline for her dramatic improvement doesn't align well w narcan administration, so not sure if this is really the culprit.  If she did have sz and confusion at home, suppose it is possible she could have taken add'l pain meds by mistake P -stable for admission  outside the ICU -no need for narcan gtt right now.  -check VPA and topiramate levels -her home AEDs (some of which could be Rx for pain) are VPA (500mg  daily), topamax (25 mg daily PRN for sz) gabapentin (600mg  QID) and baclofen (10mg  TID)  -will start her home topamax and baclofen,   Best Practice (right click and "Reselect all SmartList Selections" daily)   Diet/type: {diet type:25684} DVT prophylaxis {anticoagulation:25687} Pressure ulcer(s): {pressure ulcer(s):31683} GI prophylaxis: {MV:78469} Lines: {Central Venous Access:25771} Foley:   {Central Venous Access:25691} Code Status:  {Code Status:26939} Last date of multidisciplinary goals of care discussion [***]  Labs   CBC: Recent Labs  Lab 11/02/23 2133  WBC 13.0*  HGB 13.7  HCT 42.5  MCV 85.3  PLT 230    Basic Metabolic Panel: Recent Labs  Lab 11/02/23 2133  NA 138  K 3.1*  CL 99  CO2 28  GLUCOSE 97  BUN 11  CREATININE 1.07*  CALCIUM 9.1   GFR: Estimated Creatinine Clearance: 69 mL/min (A) (by C-G formula based on SCr of 1.07 mg/dL (H)). Recent Labs  Lab 11/02/23 2133  WBC 13.0*    Liver Function Tests: Recent Labs  Lab 11/02/23 2133  AST 33  ALT 14  ALKPHOS 65  BILITOT 0.8  PROT 7.2  ALBUMIN 3.2*   No results for input(s): "LIPASE", "AMYLASE" in the last 168 hours. No results for input(s): "AMMONIA" in the last 168 hours.  ABG    Component Value Date/Time   PHART 7.433 10/28/2021 1342   PCO2ART 43.5 10/28/2021 1342   PO2ART 65.4 (L) 10/28/2021 1342   HCO3 28.8 (H) 10/28/2021 1342   TCO2 28 10/27/2021 2054   ACIDBASEDEF 0.2 05/03/2018 0015   O2SAT 93.5 10/28/2021 1342     Coagulation Profile: No results for input(s): "INR", "PROTIME" in the last 168 hours.  Cardiac Enzymes: No results for input(s): "CKTOTAL", "CKMB", "CKMBINDEX", "TROPONINI" in the last 168 hours.  HbA1C: No results found for: "HGBA1C"  CBG: No results for input(s): "GLUCAP" in the last 168 hours.  Review of Systems:   ***  Past Medical History:  She,  has a past medical history of Anemia, Anxiety, Asthma, Chronic back pain, Chronic neck pain, Complex regional pain syndrome, COPD (chronic obstructive pulmonary disease) (HCC), GERD (gastroesophageal reflux disease), Hematuria (09/07/2013), Hypertension, IBS (irritable bowel syndrome), Migraine, and Paresthesia of left arm.   Surgical History:   Past Surgical History:  Procedure Laterality Date  . BIOPSY  10/31/2021   Procedure: BIOPSY;  Surgeon: Lupita Leash, MD;  Location: [facility] ENDOSCOPY;   Service: Cardiopulmonary;;  . BRONCHIAL NEEDLE ASPIRATION BIOPSY  10/31/2021   Procedure: BRONCHIAL NEEDLE ASPIRATION BIOPSIES;  Surgeon: Lupita Leash, MD;  Location: [facility] ENDOSCOPY;  Service: Cardiopulmonary;;  . BRONCHIAL WASHINGS  10/31/2021   Procedure: BRONCHIAL WASHINGS;  Surgeon: Lupita Leash, MD;  Location: [facility] ENDOSCOPY;  Service: Cardiopulmonary;;  . DILITATION & CURRETTAGE/HYSTROSCOPY WITH THERMACHOICE ABLATION N/A 08/17/2013   Procedure: DILATATION & CURETTAGE/HYSTEROSCOPY WITH THERMACHOICE ABLATION;  Surgeon: Tilda Burrow, MD;  Location: [facility] ORS;  Service: Gynecology;  Laterality: N/A;  18 ml D5W in & out; total therapy time= 9 minutes 33 seconds; temp= 87 degrees celcius  . HERNIA REPAIR     ventral hernia  . POLYPECTOMY N/A 08/17/2013   Procedure: REMOVAL ENDOMETRIAL POLYP;  Surgeon: Tilda Burrow, MD;  Location: [facility] ORS;  Service: Gynecology;  Laterality: N/A;  . TUBAL LIGATION    . ULNAR NERVE REPAIR Left  [facility] 2013  . VIDEO BRONCHOSCOPY WITH ENDOBRONCHIAL ULTRASOUND N/A 10/31/2021   Procedure: VIDEO BRONCHOSCOPY WITH ENDOBRONCHIAL ULTRASOUND;  Surgeon: Lupita Leash, MD;  Location: [facility] ENDOSCOPY;  Service: Cardiopulmonary;  Laterality: N/A;     Social History:   reports that she has been smoking cigarettes. She has a 26 pack-year smoking history. She has never used smokeless tobacco. She reports that she does not drink alcohol and does not use drugs.   Family History:  Her family history includes Cancer in her maternal aunt, maternal grandmother, and sister; Diabetes in her brother and sister; Heart disease in her brother and sister; Hypertension in her sister; Other in her mother.   Allergies Allergies  Allergen Reactions  . Gabapentin Other (See Comments) and Hypertension    Elevated BP requiring doubling of current meds  . Other Other (See Comments)    Unnamed IV meds for migraine, patient stated it made her feel like "something was crawling all over"  her body     Home Medications  Prior to Admission medications   Medication Sig Start Date End Date Taking? Authorizing Provider  albuterol (PROVENTIL) (2.5 MG/3ML) 0.083% nebulizer solution Take 3 mLs (2.5 mg total) by nebulization every 6 (six) hours as needed for wheezing or shortness of breath. 03/18/20 01/07/22  Lanae Boast, MD  albuterol (VENTOLIN HFA) 108 (90 Base) MCG/ACT inhaler Inhale 2 puffs into the lungs every 4 (four) hours as needed for wheezing or shortness of breath. 02/05/22   Nyoka Cowden, MD  ALPRAZolam Prudy Feeler) 1 MG tablet Take 1 mg by mouth 3 (three) times daily.    [provider]  baclofen (LIORESAL) 10 MG tablet Take 10 mg by mouth in the morning, at noon, and at bedtime. Morning, afternon, and    [provider]  divalproex (DEPAKOTE) 500 MG DR tablet Take 1 tablet (500 mg total) by mouth daily. 06/07/21   Joseph Art, DO  DULoxetine (CYMBALTA) 60 MG capsule Take 1 capsule (60 mg total) by mouth in the morning and at bedtime. 06/07/21   Joseph Art, DO  famotidine (PEPCID) 20 MG tablet One after supper 01/07/22   Nyoka Cowden, MD  gabapentin (NEURONTIN) 300 MG capsule Take 600 mg by mouth 4 (four) times daily. 05/14/16   [provider]  hydrochlorothiazide (HYDRODIURIL) 50 MG tablet Take 1 tablet (50 mg total) by mouth daily. 06/07/21   Joseph Art, DO  olmesartan (BENICAR) 20 MG tablet Take 1 tablet (20 mg total) by mouth daily. 01/07/22   Nyoka Cowden, MD  oxyCODONE (ROXICODONE) 15 MG immediate release tablet Take 15 mg by mouth 4 (four) times daily.    [provider]  pantoprazole (PROTONIX) 40 MG tablet Take 1 tablet (40 mg total) by mouth daily. 10/31/21 01/29/22  [place], Alvira Philips, DO  topiramate (TOPAMAX) 25 MG tablet Take 25 mg by mouth daily as needed (for more than 2 seizures a day). 11/11/19   [provider]     Critical care time: ***

## 2023-11-02 NOTE — ED Notes (Signed)
ICU NP Gracie at bedside to consult pt and family

## 2023-11-02 NOTE — ED Notes (Signed)
 Patient transported to CT

## 2023-11-02 NOTE — ED Notes (Signed)
Positive response with Narcan, Dr. Viviano Simas at bedside

## 2023-11-02 NOTE — ED Provider Notes (Signed)
[facility] EM[facility] DE[facility] AT [facility] Provider Note   CSN: [id number] Arrival date & time: 11/02/23  2117     History {Add pertinent medical, surgical, social history, OB history to HPI:1} Chief Complaint  Patient presents with   AMS    [name] is a 58 y.o. female   Presenting to the ED from home with concern for worsening confusion at the house.  Patient was increasingly lethargic and obtunded with EMS.  Noted to have desaturations and was nonresponsive on arrival.  I reviewed PDMP and patient is prescribed several narcotics including 1 mg Xanax, gabapentin 300 mg, and oxycodone 15 mg and 10 mg prescriptions, all filled within the past month.  Patient is obtunded on arrival  HPI     Home Medications Prior to Admission medications   Medication Sig Start Date End Date Taking? Authorizing Provider  albuterol (PROVENTIL) (2.5 MG/3ML) 0.083% nebulizer solution Take 3 mLs (2.5 mg total) by nebulization every 6 (six) hours as needed for wheezing or shortness of breath. 03/18/20 01/07/22  Lanae Boast, MD  albuterol (VENTOLIN HFA) 108 (90 Base) MCG/ACT inhaler Inhale 2 puffs into the lungs every 4 (four) hours as needed for wheezing or shortness of breath. 02/05/22   Nyoka Cowden, MD  ALPRAZolam Prudy Feeler) 1 MG tablet Take 1 mg by mouth 3 (three) times daily.    [provider]  baclofen (LIORESAL) 10 MG tablet Take 10 mg by mouth in the morning, at noon, and at bedtime. Morning, afternon, and    [provider]  divalproex (DEPAKOTE) 500 MG DR tablet Take 1 tablet (500 mg total) by mouth daily. 06/07/21   Joseph Art, DO  DULoxetine (CYMBALTA) 60 MG capsule Take 1 capsule (60 mg total) by mouth in the morning and at bedtime. 06/07/21   Joseph Art, DO  famotidine (PEPCID) 20 MG tablet One after supper 01/07/22   Nyoka Cowden, MD  gabapentin (NEURONTIN) 300 MG capsule Take 600 mg by mouth 4 (four) times daily. 05/14/16   [provider]   hydrochlorothiazide (HYDRODIURIL) 50 MG tablet Take 1 tablet (50 mg total) by mouth daily. 06/07/21   Joseph Art, DO  olmesartan (BENICAR) 20 MG tablet Take 1 tablet (20 mg total) by mouth daily. 01/07/22   Nyoka Cowden, MD  oxyCODONE (ROXICODONE) 15 MG immediate release tablet Take 15 mg by mouth 4 (four) times daily.    [provider]  pantoprazole (PROTONIX) 40 MG tablet Take 1 tablet (40 mg total) by mouth daily. 10/31/21 01/29/22  [place], Alvira Philips, DO  topiramate (TOPAMAX) 25 MG tablet Take 25 mg by mouth daily as needed (for more than 2 seizures a day). 11/11/19   [provider]      Allergies    Gabapentin and Other    Review of Systems   Review of Systems  Physical Exam Updated Vital Signs BP 109/81   Pulse (!) 121   Temp (!) 97.5 F (36.4 C) (Temporal)   Resp (!) 38   Ht 5\' 6"  (1.676 m)   Wt 95 kg   SpO2 91%   BMI 33.80 kg/m  Physical Exam Constitutional:      Comments: Obtunded  HENT:     Head: Normocephalic and atraumatic.  Eyes:     Comments: Pinpoint pupils  Cardiovascular:     Rate and Rhythm: Normal rate and regular rhythm.  Pulmonary:     Effort: Pulmonary effort is normal. No respiratory  distress.  Abdominal:     General: There is no distension.     Tenderness: There is no abdominal tenderness.  Skin:    General: Skin is warm and dry.     ED Results / Procedures / Treatments   Labs (all labs ordered are listed, but only abnormal results are displayed) Labs Reviewed  CBC - Abnormal; Notable for the following components:      Result Value   WBC 13.0 (*)    All other components within normal limits  COMPREHENSIVE METABOLIC PANEL  HCG, SERUM, QUALITATIVE  RAPID URINE DRUG SCREEN, HOSP PERFORMED  URINALYSIS, ROUTINE W REFLEX MICROSCOPIC  ETHANOL  CBG MONITORING, ED  TROPONIN I (HIGH SENSITIVITY)    EKG EKG Interpretation Date/Time:  Sunday November 02 2023 21:18:40 EST Ventricular Rate:  71 PR Interval:  134 QRS  Duration:  98 QT Interval:  545 QTC Calculation: 593 R Axis:   75  Text Interpretation: Sinus rhythm Borderline T wave abnormalities Prolonged QT interval Confirmed by Alvester Chou [phone]) on 11/02/2023 9:37:12 PM  Radiology No results found.  Procedures Procedures  {Document cardiac monitor, telemetry assessment procedure when appropriate:1}  Medications Ordered in ED Medications  naloxone [facility]) injection 2 mg (2 mg Intravenous Given 11/02/23 2130)  ondansetron (ZOFRAN) injection 4 mg (4 mg Intravenous Given 11/02/23 2132)    ED Course/ Medical Decision Making/ A&P Clinical Course as of 11/02/23 2206  Sun Nov 02, 2023  2135 Patient was given Narcan and had an immediate response, now awake [MT]  2153 Patient with labored breathing, I ordered a stat chest x-ray, concern for potential aspiration versus pulmonary edema [MT]  2204 Patient becoming somnolent again but was able to awaken to voice. [MT]  2206 Neither patient's son Elita Quick or husband listed in emergency contacts are answering the phone when I attempted to reach them [MT]    Clinical Course User Index [MT] Luxe Cuadros, Kermit Balo, MD   {   Click here for ABCD2, HEART and other calculatorsREFRESH Note before signing :1}                              Medical Decision Making Amount and/or Complexity of Data Reviewed Labs: ordered. Radiology: ordered.  Risk Prescription drug management.   This patient presents to the ED with concern for ***. This involves an extensive number of treatment options, and is a complaint that carries with it a high risk of complications and morbidity.  The differential diagnosis includes ***  Co-morbidities that complicate the patient evaluation: ***  Additional history obtained from ***  External records from outside source obtained and reviewed including ***  I ordered and personally interpreted labs.  The pertinent results include:  ***  I ordered imaging studies including *** I  independently visualized and interpreted imaging which showed *** I agree with the radiologist interpretation  The patient was maintained on a cardiac monitor.  I personally viewed and interpreted the cardiac monitored which showed an underlying rhythm of: ***  Per my interpretation the patient's ECG shows ***  I ordered medication including ***  for *** I have reviewed the patients home medicines and have made adjustments as needed  Test Considered: ***  I requested consultation with the ***,  and discussed lab and imaging findings as well as pertinent plan - they recommend: ***  After the interventions noted above, I reevaluated the patient and found that they have: {resolved/improved/worsened:23923::"improved"}  Social Determinants  of Health:***  Dispostion:  After consideration of the diagnostic results and the patients response to treatment, I feel that the patent would benefit from ***.    {Document critical care time when appropriate:1} {Document review of labs and clinical decision tools ie heart score, Chads2Vasc2 etc:1}  {Document your independent review of radiology images, and any outside records:1} {Document your discussion with family members, caretakers, and with consultants:1} {Document social determinants of health affecting pt's care:1} {Document your decision making why or why not admission, treatments were needed:1} Final Clinical Impression(s) / ED Diagnoses Final diagnoses:  None    Rx / DC Orders ED Discharge Orders     None

## 2023-11-03 ENCOUNTER — Telehealth: Payer: Self-pay | Admitting: Physician Assistant

## 2023-11-03 ENCOUNTER — Inpatient Hospital Stay (HOSPITAL_COMMUNITY): Payer: 59

## 2023-11-03 DIAGNOSIS — G9340 Encephalopathy, unspecified: Secondary | ICD-10-CM | POA: Diagnosis present

## 2023-11-03 DIAGNOSIS — Z79891 Long term (current) use of opiate analgesic: Secondary | ICD-10-CM | POA: Diagnosis not present

## 2023-11-03 DIAGNOSIS — E876 Hypokalemia: Secondary | ICD-10-CM | POA: Diagnosis present

## 2023-11-03 DIAGNOSIS — R569 Unspecified convulsions: Secondary | ICD-10-CM | POA: Diagnosis not present

## 2023-11-03 DIAGNOSIS — J9620 Acute and chronic respiratory failure, unspecified whether with hypoxia or hypercapnia: Secondary | ICD-10-CM

## 2023-11-03 DIAGNOSIS — F17210 Nicotine dependence, cigarettes, uncomplicated: Secondary | ICD-10-CM | POA: Diagnosis present

## 2023-11-03 DIAGNOSIS — K219 Gastro-esophageal reflux disease without esophagitis: Secondary | ICD-10-CM | POA: Diagnosis present

## 2023-11-03 DIAGNOSIS — I10 Essential (primary) hypertension: Secondary | ICD-10-CM

## 2023-11-03 DIAGNOSIS — G894 Chronic pain syndrome: Secondary | ICD-10-CM | POA: Diagnosis present

## 2023-11-03 DIAGNOSIS — F32A Depression, unspecified: Secondary | ICD-10-CM | POA: Diagnosis present

## 2023-11-03 DIAGNOSIS — Z79899 Other long term (current) drug therapy: Secondary | ICD-10-CM | POA: Diagnosis not present

## 2023-11-03 DIAGNOSIS — J9621 Acute and chronic respiratory failure with hypoxia: Secondary | ICD-10-CM | POA: Diagnosis present

## 2023-11-03 DIAGNOSIS — G40909 Epilepsy, unspecified, not intractable, without status epilepticus: Secondary | ICD-10-CM | POA: Diagnosis present

## 2023-11-03 DIAGNOSIS — T402X5A Adverse effect of other opioids, initial encounter: Secondary | ICD-10-CM | POA: Diagnosis present

## 2023-11-03 DIAGNOSIS — F419 Anxiety disorder, unspecified: Secondary | ICD-10-CM | POA: Diagnosis present

## 2023-11-03 DIAGNOSIS — Z8249 Family history of ischemic heart disease and other diseases of the circulatory system: Secondary | ICD-10-CM | POA: Diagnosis not present

## 2023-11-03 DIAGNOSIS — G928 Other toxic encephalopathy: Secondary | ICD-10-CM | POA: Diagnosis present

## 2023-11-03 DIAGNOSIS — Z833 Family history of diabetes mellitus: Secondary | ICD-10-CM | POA: Diagnosis not present

## 2023-11-03 DIAGNOSIS — J4489 Other specified chronic obstructive pulmonary disease: Secondary | ICD-10-CM | POA: Diagnosis present

## 2023-11-03 LAB — BASIC METABOLIC PANEL
Anion gap: 9 (ref 5–15)
BUN: 9 mg/dL (ref 6–20)
CO2: 32 mmol/L (ref 22–32)
Calcium: 8.9 mg/dL (ref 8.9–10.3)
Chloride: 98 mmol/L (ref 98–111)
Creatinine, Ser: 0.73 mg/dL (ref 0.44–1.00)
GFR, Estimated: >60 mL/min (ref >60)
Glucose, Bld: 109 mg/dL — ABNORMAL HIGH (ref 70–99)
Potassium: 3.4 mmol/L — ABNORMAL LOW (ref 3.5–5.1)
Sodium: 139 mmol/L (ref 135–145)

## 2023-11-03 LAB — URINALYSIS, ROUTINE W REFLEX MICROSCOPIC
Bacteria, UA: NONE SEEN
Bilirubin Urine: NEGATIVE
Glucose, UA: NEGATIVE mg/dL
Ketones, ur: NEGATIVE mg/dL
Leukocytes,Ua: NEGATIVE
Nitrite: NEGATIVE
Protein, ur: NEGATIVE mg/dL
Specific Gravity, Urine: 1.006 (ref 1.005–1.030)
pH: 6 (ref 5.0–8.0)

## 2023-11-03 LAB — RAPID URINE DRUG SCREEN, HOSP PERFORMED
Amphetamines: NOT DETECTED
Barbiturates: NOT DETECTED
Benzodiazepines: NOT DETECTED
Cocaine: NOT DETECTED
Opiates: NOT DETECTED
Tetrahydrocannabinol: NOT DETECTED

## 2023-11-03 LAB — CBC
HCT: 37.6 % (ref 36.0–46.0)
Hemoglobin: 12.4 g/dL (ref 12.0–15.0)
MCH: 27.6 pg (ref 26.0–34.0)
MCHC: 33 g/dL (ref 30.0–36.0)
MCV: 83.7 fL (ref 80.0–100.0)
Platelets: 248 10*3/uL (ref 150–400)
RBC: 4.49 MIL/uL (ref 3.87–5.11)
RDW: 14.1 % (ref 11.5–15.5)
WBC: 12.3 10*3/uL — ABNORMAL HIGH (ref 4.0–10.5)
nRBC: 0 % (ref 0.0–0.2)

## 2023-11-03 LAB — MRSA NEXT GEN BY PCR, NASAL: MRSA by PCR Next Gen: NOT DETECTED

## 2023-11-03 LAB — GLUCOSE, CAPILLARY
Glucose-Capillary: 169 mg/dL — ABNORMAL HIGH (ref 70–99)
Glucose-Capillary: 175 mg/dL — ABNORMAL HIGH (ref 70–99)
Glucose-Capillary: 95 mg/dL (ref 70–99)
Glucose-Capillary: 97 mg/dL (ref 70–99)

## 2023-11-03 LAB — PROCALCITONIN: Procalcitonin: 0.17 ng/mL

## 2023-11-03 LAB — TROPONIN I (HIGH SENSITIVITY): Troponin I (High Sensitivity): 8 ng/L (ref <18)

## 2023-11-03 LAB — HIV ANTIBODY (ROUTINE TESTING W REFLEX): HIV Screen 4th Generation wRfx: NONREACTIVE

## 2023-11-03 LAB — VALPROIC ACID LEVEL: Valproic Acid Lvl: 33 ug/mL — ABNORMAL LOW (ref 50.0–100.0)

## 2023-11-03 MED ORDER — GABAPENTIN 300 MG PO CAPS: 300.0000 mg | ORAL_CAPSULE | Freq: Four times a day (QID) | ORAL | Status: DC

## 2023-11-03 MED ORDER — ALPRAZOLAM 0.25 MG PO TABS
0.5000 mg | ORAL_TABLET | Freq: Two times a day (BID) | ORAL | Status: DC | PRN
  Administered 2023-11-03: 0.5 mg via ORAL
  Filled 2023-11-03: qty 2

## 2023-11-03 MED ORDER — ALPRAZOLAM 0.25 MG PO TABS
0.5000 mg | ORAL_TABLET | Freq: Three times a day (TID) | ORAL | Status: DC | PRN
  Administered 2023-11-03: 0.5 mg via ORAL
  Filled 2023-11-03 (×2): qty 2

## 2023-11-03 MED ORDER — TOPIRAMATE 25 MG PO TABS: 25.0000 mg | ORAL_TABLET | Freq: Every day | ORAL | Status: DC

## 2023-11-03 MED ORDER — DOCUSATE SODIUM 100 MG PO CAPS: 100.0000 mg | ORAL_CAPSULE | Freq: Two times a day (BID) | ORAL | Status: DC | PRN

## 2023-11-03 MED ORDER — BACLOFEN 10 MG PO TABS
10.0000 mg | ORAL_TABLET | Freq: Three times a day (TID) | ORAL | Status: DC
  Administered 2023-11-03 (×3): 10 mg via ORAL
  Filled 2023-11-03 (×3): qty 1

## 2023-11-03 MED ORDER — BACLOFEN 10 MG PO TABS: 10.0000 mg | ORAL_TABLET | Freq: Two times a day (BID) | ORAL | Status: DC

## 2023-11-03 MED ORDER — GABAPENTIN 300 MG PO CAPS
300.0000 mg | ORAL_CAPSULE | Freq: Four times a day (QID) | ORAL | Status: DC
  Administered 2023-11-03 (×2): 300 mg via ORAL
  Filled 2023-11-03 (×2): qty 1

## 2023-11-03 MED ORDER — CHLORHEXIDINE GLUCONATE CLOTH 2 % EX PADS
6.0000 | MEDICATED_PAD | Freq: Every day | CUTANEOUS | Status: DC
  Administered 2023-11-03 (×2): 6 via TOPICAL

## 2023-11-03 MED ORDER — ACETAMINOPHEN 325 MG PO TABS
650.0000 mg | ORAL_TABLET | ORAL | Status: DC | PRN
  Administered 2023-11-03 – 2023-11-04 (×3): 650 mg via ORAL
  Filled 2023-11-03 (×3): qty 2

## 2023-11-03 MED ORDER — HEPARIN SODIUM (PORCINE) 5000 UNIT/ML IJ SOLN
5000.0000 [IU] | Freq: Three times a day (TID) | INTRAMUSCULAR | Status: DC
  Administered 2023-11-03 – 2023-11-04 (×4): 5000 [IU] via SUBCUTANEOUS
  Filled 2023-11-03 (×4): qty 1

## 2023-11-03 MED ORDER — FOLIC ACID 1 MG PO TABS
1.0000 mg | ORAL_TABLET | Freq: Every day | ORAL | Status: DC
  Administered 2023-11-03: 1 mg via ORAL
  Filled 2023-11-03: qty 1

## 2023-11-03 MED ORDER — DIVALPROEX SODIUM 500 MG PO DR TAB: 500.0000 mg | DELAYED_RELEASE_TABLET | Freq: Every day | ORAL | Status: DC

## 2023-11-03 MED ORDER — DULOXETINE HCL 60 MG PO CPEP
60.0000 mg | ORAL_CAPSULE | Freq: Two times a day (BID) | ORAL | Status: DC
  Administered 2023-11-03 (×2): 60 mg via ORAL
  Filled 2023-11-03 (×2): qty 1

## 2023-11-03 MED ORDER — IPRATROPIUM-ALBUTEROL 0.5-2.5 (3) MG/3ML IN SOLN
3.0000 mL | RESPIRATORY_TRACT | Status: DC | PRN
  Administered 2023-11-03: 3 mL via RESPIRATORY_TRACT
  Filled 2023-11-03: qty 3

## 2023-11-03 MED ORDER — DIVALPROEX SODIUM 250 MG PO DR TAB
500.0000 mg | DELAYED_RELEASE_TABLET | Freq: Every day | ORAL | Status: DC
  Administered 2023-11-03: 500 mg via ORAL
  Filled 2023-11-03 (×2): qty 2

## 2023-11-03 MED ORDER — POTASSIUM CHLORIDE CRYS ER 20 MEQ PO TBCR
40.0000 meq | EXTENDED_RELEASE_TABLET | Freq: Once | ORAL | Status: AC
  Administered 2023-11-03: 40 meq via ORAL
  Filled 2023-11-03: qty 2

## 2023-11-03 MED ORDER — NALOXONE HCL 0.4 MG/ML IJ SOLN
0.4000 mg | Freq: Once | INTRAMUSCULAR | Status: AC
  Administered 2023-11-03: 0.4 mg via INTRAVENOUS
  Filled 2023-11-03: qty 1

## 2023-11-03 MED ORDER — ALBUTEROL SULFATE (2.5 MG/3ML) 0.083% IN NEBU: 2.5000 mg | INHALATION_SOLUTION | RESPIRATORY_TRACT | Status: DC | PRN

## 2023-11-03 MED ORDER — GABAPENTIN 100 MG PO CAPS: 200.0000 mg | ORAL_CAPSULE | Freq: Four times a day (QID) | ORAL | Status: DC

## 2023-11-03 MED ORDER — POTASSIUM CHLORIDE 20 MEQ PO PACK
40.0000 meq | PACK | Freq: Once | ORAL | Status: AC
  Administered 2023-11-03: 40 meq via ORAL
  Filled 2023-11-03: qty 2

## 2023-11-03 MED ORDER — THIAMINE HCL 100 MG/ML IJ SOLN
100.0000 mg | Freq: Every day | INTRAMUSCULAR | Status: DC
  Administered 2023-11-03: 100 mg via INTRAVENOUS
  Filled 2023-11-03: qty 2

## 2023-11-03 MED ORDER — CLONAZEPAM 0.125 MG PO TBDP
0.5000 mg | ORAL_TABLET | Freq: Two times a day (BID) | ORAL | Status: DC
Start: 2023-11-03 — End: 2023-11-04
  Administered 2023-11-03: 0.5 mg via ORAL
  Filled 2023-11-03: qty 4

## 2023-11-03 MED ORDER — ADULT MULTIVITAMIN W/MINERALS CH
1.0000 | ORAL_TABLET | Freq: Every day | ORAL | Status: DC
  Administered 2023-11-03: 1
  Filled 2023-11-03: qty 1

## 2023-11-03 MED ORDER — MAGNESIUM SULFATE 2 GM/50ML IV SOLN
2.0000 g | Freq: Once | INTRAVENOUS | Status: AC
  Administered 2023-11-03: 2 g via INTRAVENOUS
  Filled 2023-11-03: qty 50

## 2023-11-03 MED ORDER — NALOXONE HCL 0.4 MG/ML IJ SOLN: 0.4000 mg | INTRAMUSCULAR | Status: DC | PRN

## 2023-11-03 MED ORDER — GABAPENTIN 300 MG PO CAPS
600.0000 mg | ORAL_CAPSULE | Freq: Four times a day (QID) | ORAL | Status: DC
  Administered 2023-11-03 (×2): 600 mg via ORAL
  Filled 2023-11-03 (×2): qty 2

## 2023-11-03 MED ORDER — MOMETASONE FURO-FORMOTEROL FUM 200-5 MCG/ACT IN AERO
2.0000 | INHALATION_SPRAY | Freq: Two times a day (BID) | RESPIRATORY_TRACT | Status: DC
  Administered 2023-11-03 – 2023-11-04 (×3): 2 via RESPIRATORY_TRACT
  Filled 2023-11-03: qty 8.8

## 2023-11-03 MED ORDER — POLYETHYLENE GLYCOL 3350 17 G PO PACK: 17.0000 g | PACK | Freq: Every day | ORAL | Status: DC | PRN

## 2023-11-03 MED ORDER — OXYCODONE HCL 5 MG PO TABS
10.0000 mg | ORAL_TABLET | ORAL | Status: DC | PRN
  Administered 2023-11-03 – 2023-11-04 (×3): 10 mg via ORAL
  Filled 2023-11-03 (×3): qty 2

## 2023-11-03 MED ORDER — GABAPENTIN 300 MG PO CAPS
600.0000 mg | ORAL_CAPSULE | Freq: Four times a day (QID) | ORAL | Status: DC
  Filled 2023-11-03: qty 2

## 2023-11-03 MED ORDER — LORAZEPAM 2 MG/ML IJ SOLN
2.0000 mg | INTRAMUSCULAR | Status: DC | PRN
  Filled 2023-11-03 (×2): qty 1

## 2023-11-03 NOTE — Progress Notes (Signed)
Pt walked the hallway w/ and w/o O2. Pt was able to tolerate w/ 4L o2 sat 94% but would drop to 91%. Pt w/o o2 sat 89% but would drop as low as 71% asymptomatic.

## 2023-11-03 NOTE — ED Notes (Signed)
Dr. Renaye Rakers at the bedside to speak with pt and her family regarding plan of care and her chronic pain syndrome.

## 2023-11-03 NOTE — Progress Notes (Signed)
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 Apparently was altered, repeating questions and not acting like her self. With EMS initially GCS 15 then abruptly less responsive; associated decr RR and hypoxia requiring BVM. Received narcan in ED with modest transient improvement. An hour later received a second dose of narcan but sounds like she was pretty quickly back to responding only to sternal rub.   PCCM consulted for admission in setting of possible narcan gtt need.   On my arrival she is returning from CT Head, these images are not yet visible.  She wakes up easily for me and per family and nursing what I am able to glean is the most alert/coherent she has been by a good margin. She is holding conversations, Oriented x3. Says she has missed some of her home sz medicines, was scared she would run out. Endorsed a seizure last night and again this morning. Didn't want to take her gabapentin because it makes her feel badly and she was supposed to start a new job.  Denies taking more opiates than what is Rx. Family feels like she is usually very careful/diligent with her meds.   Labs are grossly unremarkable -- slightly low K slightly hight Cr and WBC.    Pertinent  Medical History  Sz Chronic pain Opiate dependence  Asthma  Significant Hospital Events: Including procedures, antibiotic start and stop dates in addition to other pertinent events   2/16 PCCM consulted for possible narcan gtt after r2x doses of narcan. Markedly improved at time of our eval. CT H without acute abnormalities  Interim History / Subjective:  Patient attempting to leave ama. On 6L [place] w/ sats 91%. When taken off o2 sats dropped to 80% and patient ok w/  staying for now.   Objective   Blood pressure 104/82, pulse 66, temperature 98.4 F (36.9 C), temperature source Oral, resp. rate 16, height 5\' 6"  (1.676 m), weight 95 kg, SpO2 94%.        Intake/Output Summary (Last 24 hours) at 11/03/2023 0949 Last data filed at 11/03/2023 0300 Gross per 24 hour  Intake 50 ml  Output --  Net 50 ml   Filed Weights   11/02/23 2127  Weight: 95 kg    Examination: General: NAD HEENT: MM pink/moist; [place] in place Neuro: Aox3; MAE CV: s1s2, RRR, no m/r/g PULM:  dim clear BS bilaterally; [place] 6L GI: soft, bsx4 active  Extremities: warm/dry, no edema  Skin: no rashes or lesions    Resolved Hospital Problem list     Assessment & Plan:   Acute encephalopathy, improving  Hx seizures, ? Breakthrough seizure and post-ictal state CRPS with opiate dependence, ? Accidental over-ingestion?  -CT H without acute abnormality -Does endorse not taking her AEDs as Rx however -- says she was scared she would run out but couldn't tell me which she has been sparing -- and also endorses 1 sz 2/15 night and 2/16 day, and not wanting to take seizure meds with these because they make her feel poorly  -denies taking more pain meds than Rx. Did receive narcan in ED x2. Some improvement w this but the timeline for her dramatic improvement doesn't align well w narcan  administration, so not sure if this is really the culprit.  If she did have sz and confusion at home, suppose it is possible she could have taken add'l pain meds by mistake -UDS negative P: -mentation improved -limit sedating meds -vpa level 33 and topiramate pending; resume home depakote -seizure precautions -spot eeg results pending -consider neuro consult -resume home gabapentin, benzo, baclofen: possible withdrawal?  Hypokalemia Plan: -K this am 3.4; given potassium po -trend and replete as needed  Chronic pain syndrome Plan: -resume home cymbalta and gabapentin -resume baclofen  Acute on  chronic respiratory failure on 2L [place] at home Hx of asthma and COPD: followed by [name] outpt; last seen 12/2021 Plan: -cont [place] for sat goal >92% -prn duoneb -start on dulera  Htn Plan: -currently normotensive; hold home meds for now  Anxiety/depression Plan: -prn xanax -cymbalta   Best Practice (right click and "Reselect all SmartList Selections" daily)   Best Practice (right click and "Reselect all SmartList Selections" daily)   Diet/type: clear liquids DVT prophylaxis: prophylactic heparin  GI prophylaxis: N/A Lines: N/A Foley:  N/A Code Status:  full code Last date of multidisciplinary goals of care discussion [2/17 son Joan [name] updated at bedside]  Labs   CBC: Recent Labs  Lab 11/02/23 2133 11/03/23 0356  WBC 13.0* 12.3*  HGB 13.7 12.4  HCT 42.5 37.6  MCV 85.3 83.7  PLT 230 248    Basic Metabolic Panel: Recent Labs  Lab 11/02/23 2133 11/03/23 0356  NA 138 139  K 3.1* 3.4*  CL 99 98  CO2 28 32  GLUCOSE 97 109*  BUN 11 9  CREATININE 1.07* 0.73  CALCIUM 9.1 8.9   GFR: Estimated Creatinine Clearance: 92.3 mL/min (by C-G formula based on SCr of 0.73 mg/dL). Recent Labs  Lab 11/02/23 2133 11/03/23 0356  WBC 13.0* 12.3*    Liver Function Tests: Recent Labs  Lab 11/02/23 2133  AST 33  ALT 14  ALKPHOS 65  BILITOT 0.8  PROT 7.2  ALBUMIN 3.2*   No results for input(s): "LIPASE", "AMYLASE" in the last 168 hours. No results for input(s): "AMMONIA" in the last 168 hours.  ABG    Component Value Date/Time   PHART 7.433 10/28/2021 1342   PCO2ART 43.5 10/28/2021 1342   PO2ART 65.4 (L) 10/28/2021 1342   HCO3 28.8 (H) 10/28/2021 1342   TCO2 28 10/27/2021 2054   ACIDBASEDEF 0.2 05/03/2018 0015   O2SAT 93.5 10/28/2021 1342     Coagulation Profile: No results for input(s): "INR", "PROTIME" in the last 168 hours.  Cardiac Enzymes: No results for input(s): "CKTOTAL", "CKMB", "CKMBINDEX", "TROPONINI" in the last 168 hours.  HbA1C: No results  found for: "HGBA1C"  CBG: Recent Labs  Lab 11/02/23 2116  GLUCAP 95    Review of Systems:   Pain Confusion Tiredness  - SI - Depression  - substance use   Past Medical History:  She,  has a past medical history of Anemia, Anxiety, Asthma, Chronic back pain, Chronic neck pain, Complex regional pain syndrome, COPD (chronic obstructive pulmonary disease) (HCC), GERD (gastroesophageal reflux disease), Hematuria (09/07/2013), Hypertension, IBS (irritable bowel syndrome), Migraine, and Paresthesia of left arm.   Surgical History:   Past Surgical History:  Procedure Laterality Date   BIOPSY  10/31/2021   Procedure: BIOPSY;  Surgeon: Lupita Leash, MD;  Location: [facility] ENDOSCOPY;  Service: Cardiopulmonary;;   BRONCHIAL NEEDLE ASPIRATION BIOPSY  10/31/2021   Procedure: BRONCHIAL NEEDLE ASPIRATION BIOPSIES;  Surgeon: Lupita Leash, MD;  Location:  [facility] ENDOSCOPY;  Service: Cardiopulmonary;;   BRONCHIAL WASHINGS  10/31/2021   Procedure: BRONCHIAL WASHINGS;  Surgeon: Lupita Leash, MD;  Location: [facility] ENDOSCOPY;  Service: Cardiopulmonary;;   DILITATION & CURRETTAGE/HYSTROSCOPY WITH THERMACHOICE ABLATION N/A 08/17/2013   Procedure: DILATATION & CURETTAGE/HYSTEROSCOPY WITH THERMACHOICE ABLATION;  Surgeon: Tilda Burrow, MD;  Location: [facility] ORS;  Service: Gynecology;  Laterality: N/A;  18 ml D5W in & out; total therapy time= 9 minutes 33 seconds; temp= 87 degrees celcius   HERNIA REPAIR     ventral hernia   POLYPECTOMY N/A 08/17/2013   Procedure: REMOVAL ENDOMETRIAL POLYP;  Surgeon: Tilda Burrow, MD;  Location: [facility] ORS;  Service: Gynecology;  Laterality: N/A;   TUBAL LIGATION     ULNAR NERVE REPAIR Left    [facility] 2013   VIDEO BRONCHOSCOPY WITH ENDOBRONCHIAL ULTRASOUND N/A 10/31/2021   Procedure: VIDEO BRONCHOSCOPY WITH ENDOBRONCHIAL ULTRASOUND;  Surgeon: Lupita Leash, MD;  Location: [facility] ENDOSCOPY;  Service: Cardiopulmonary;  Laterality: N/A;     Social History:   reports that she  has been smoking cigarettes. She has a 26 pack-year smoking history. She has never used smokeless tobacco. She reports that she does not drink alcohol and does not use drugs.   Family History:  Her family history includes Cancer in her maternal aunt, maternal grandmother, and sister; Diabetes in her brother and sister; Heart disease in her brother and sister; Hypertension in her sister; Other in her mother.   Allergies Allergies  Allergen Reactions   Gabapentin Other (See Comments) and Hypertension    Elevated BP requiring doubling of current meds   Other Other (See Comments)    Unnamed IV meds for migraine, patient stated it made her feel like "something was crawling all over" her body     Home Medications  Prior to Admission medications   Medication Sig Start Date End Date Taking? Authorizing Provider  albuterol (PROVENTIL) (2.5 MG/3ML) 0.083% nebulizer solution Take 3 mLs (2.5 mg total) by nebulization every 6 (six) hours as needed for wheezing or shortness of breath. 03/18/20 01/07/22  Lanae Boast, MD  albuterol (VENTOLIN HFA) 108 (90 Base) MCG/ACT inhaler Inhale 2 puffs into the lungs every 4 (four) hours as needed for wheezing or shortness of breath. 02/05/22   Nyoka Cowden, MD  ALPRAZolam Prudy Feeler) 1 MG tablet Take 1 mg by mouth 3 (three) times daily.    [provider]  baclofen (LIORESAL) 10 MG tablet Take 10 mg by mouth in the morning, at noon, and at bedtime. Morning, afternon, and    [provider]  divalproex (DEPAKOTE) 500 MG Joan tablet Take 1 tablet (500 mg total) by mouth daily. 06/07/21   Joseph Art, DO  DULoxetine (CYMBALTA) 60 MG capsule Take 1 capsule (60 mg total) by mouth in the morning and at bedtime. 06/07/21   Joseph Art, DO  famotidine (PEPCID) 20 MG tablet One after supper 01/07/22   Nyoka Cowden, MD  gabapentin (NEURONTIN) 300 MG capsule Take 600 mg by mouth 4 (four) times daily. 05/14/16   [provider]  hydrochlorothiazide  (HYDRODIURIL) 50 MG tablet Take 1 tablet (50 mg total) by mouth daily. 06/07/21   Joseph Art, DO  olmesartan (BENICAR) 20 MG tablet Take 1 tablet (20 mg total) by mouth daily. 01/07/22   Nyoka Cowden, MD  oxyCODONE (ROXICODONE) 15 MG immediate release tablet Take 15 mg by mouth 4 (four) times daily.    [provider]  pantoprazole (PROTONIX)  40 MG tablet Take 1 tablet (40 mg total) by mouth daily. 10/31/21 01/29/22  [place], Alvira Philips, DO  topiramate (TOPAMAX) 25 MG tablet Take 25 mg by mouth daily as needed (for more than 2 seizures a day). 11/11/19   [provider]     Critical care time: na     Margot Chimes [facility] 11/03/2023, 10:19 AM  Please see [email or web address] for pager details.  From 7A-7P if no response, please call [phone]. After hours, please call ELink [phone].

## 2023-11-03 NOTE — Progress Notes (Signed)
 EEG complete - results pending

## 2023-11-03 NOTE — Procedures (Signed)
Patient Name: ALIEAH BRINTON  MRN: [id number]  Epilepsy Attending: Charlsie Quest  Referring Physician/Provider: Lanier Clam, NP  Date: 11/03/2023 Duration: 23.04 mins  Patient history: 58yo F with ams. EEG to evaluate for seizure  Level of alertness: Awake  AEDs during EEG study: GBP, TPM, VPA  Technical aspects: This EEG study was done with scalp electrodes positioned according to the 10-20 International system of electrode placement. Electrical activity was reviewed with band pass filter of 1-70Hz , sensitivity of 7 uV/mm, display speed of 19mm/sec with a 60Hz  notched filter applied as appropriate. EEG data were recorded continuously and digitally stored.  Video monitoring was available and reviewed as appropriate.  Description: The posterior dominant rhythm consists of 8 Hz activity of moderate voltage (25-35 uV) seen predominantly in posterior head regions, symmetric and reactive to eye opening and eye closing. Hyperventilation and photic stimulation were not performed.     IMPRESSION: This study is within normal limits. No seizures or epileptiform discharges were seen throughout the recording.  A normal interictal EEG does not exclude the diagnosis of epilepsy.  Nilaya Bouie Annabelle Harman

## 2023-11-03 NOTE — Progress Notes (Signed)
Pt refused 12pm CBG check. Donne Anon, PA informed.

## 2023-11-03 NOTE — Progress Notes (Signed)
[facility] ADULT ICU REPLACEMENT PROTOCOL   The patient does apply for the [facility] Adult ICU Electrolyte Replacment Protocol based on the criteria listed below:   1.Exclusion criteria: TCTS, ECMO, Dialysis, and Myasthenia Gravis patients 2. Is GFR >/= 30 ml/min? Yes.    Patient's GFR today is >60 3. Is SCr </= 2? Yes.   Patient's SCr is 0.73 mg/dL 4. Did SCr increase >/= 0.5 in 24 hours? No. 5.Pt's weight >40kg  Yes.   6. Abnormal electrolyte(s): K+ 3.4  7. Electrolytes replaced per protocol 8.  Call MD STAT for K+ </= 2.5, Phos </= 1, or Mag </= 1 Physician:  Dr. Wallie Char, Lilia Argue 11/03/2023 5:27 AM

## 2023-11-03 NOTE — Plan of Care (Signed)
  Problem: Education: Goal: Knowledge of General Education information will improve Description: Including pain rating scale, medication(s)/side effects and non-pharmacologic comfort measures Outcome: Progressing   Problem: Clinical Measurements: Goal: Ability to maintain clinical measurements within normal limits will improve Outcome: Progressing Goal: Respiratory complications will improve Outcome: Progressing   

## 2023-11-03 NOTE — Progress Notes (Signed)
Pt noted to have home medications in her purse. These medications; she refused to have them taken to the pharmacy or have her son (the only person at bedside at the time) to hold her meds. This RN explained [facility] policy about medications at the bedside, w/o success. Donne Anon, PA informed.

## 2023-11-03 NOTE — Progress Notes (Signed)
eLink Physician-Brief Progress Note Patient Name: GLYNNA FAILLA DOB: 1966-07-08 MRN: [id number]   Date of Service  11/03/2023  HPI/Events of Note  58 year old female with a history of chronic opiate use who presented to the emergency department with altered mental status.  Suspected to have opiate overdose.  Improved with Narcan.  She had to receive 3 doses of Narcan in the emergency department.  Was admitted to ICU for close monitoring with the possibility of initiating a Narcan infusion for repeated Narcan use.  eICU Interventions  Patient's chart reviewed.  Pertinent labs and imaging studies reviewed.  Case discussed with bedside RN.  Video assessment done. Impression: Unintentional opiate overdose Hypoxemic respiratory insufficiency secondary to deep sedation. History of chronic pain History of asthma History of seizure disorder  She has not required any more Narcan since arrival to the ICU.  She does have some wheezing on clinical exam and inhaled bronchodilators will be prescribed. Anticipate short ICU stay.     Intervention Category Evaluation Type: New Patient Evaluation  Carilyn Goodpasture 11/03/2023, 3:29 AM

## 2023-11-03 NOTE — Progress Notes (Signed)
Pt ambulated to and from the restroom w/o issue. Upon getting settled back into the bed, the pt started kicking her legs. She also began to jerking her upper body rhythmically. At the start of this activity, I was able to ask the pt if she knew what was going on. She replied, "yes, I this is what happens before I have a seizure". This activity lasted 1 min (1304-1305). Shortly after she began to thrash more w/ some banging of her right fist on the bed, but not able to communicate to what was happening ([id number]). At this time I pushed the Elink button to have the ground team paged. I also called for my charge nurse to give the pt a second look d/t my suspension of this activity being Anxiety versus a true seizure. After the duration of this event, and w/in seconds following this activity, I asked the pt if she knew where she was and she was able to answer my question correctly. Of note, in the presence of my co-worker, the pt w/n the same post-ictal stage, she complained about not receiving her home dose of Oxy and Xanax. P.S., pt ultimately received prn Xanax verus ativan w/ positive results.  Will continue to monitor this pt.

## 2023-11-03 NOTE — Discharge Summary (Signed)
Physician Discharge Summary         Patient ID: Joan [name] MRN: [id number] DOB/AGE: 02-19-1966 58 y.o.  Admit date: 11/02/2023 Discharge date: 11/04/2023  Discharge Diagnoses:    Active Hospital Problems   Diagnosis Date Noted   Acute encephalopathy 11/03/2023   Encephalopathy acute 11/03/2023   Seizure disorder (HCC) 11/03/2023    Resolved Hospital Problems  No resolved problems to display.      Discharge summary    58 yo female w/ pertinent PMH Chronic pain syndromw on chronic opiates, sz, tobacco use, GERD, asthma who presented to ED 11/02/23 w AMS. Apparently was altered, repeating questions and not acting like her self. With EMS initially GCS 15 then abruptly less responsive; associated decrease RR and hypoxia requiring BVM. Received narcan in ED with modest transient improvement. An hour later received a second dose of narcan but sounds like she was pretty quickly back to responding only to sternal rub. PCCM consulted for admission in setting of possible narcan gtt need. UDS and ethanol negative. CT head no acute abnormality. EEG w/ no seizures. On 2/17 patient Aox3 and MAE. Trying to leave ama but able to convince patient to stay given increased o2 requirements up to 6L on 2-3 L at home. Resumed home meds. K being repleted. PCT negative; holding on antibiotic initiation. On 2/18 patient improved and appears stable for discharge.    Discharge Plan by Active Problems    Acute encephalopathy: improved; likely polypharmacy Hx seizures Chronic pain syndrome with opiate dependence P: -cont home depakote; f/u w/ neurology outpt -resume home gabapentin, baclofen, oxy   Hypokalemia Plan: -f/u w/ PCP outpt   Acute on chronic respiratory failure on 2L [place] at home Hx of asthma and COPD: followed by dr wert outpt; last seen 12/2021 Plan: -cont  for sat goal >92% -prn albuterol -discharge on dulera inhaler -f/u w/ pulmonary outpt   Htn Plan: -currently normotensive;  consider resuming home lisinopril and hydrochlorothiazide -f/u outpt PCP in 1-2 weeks   Anxiety/depression? Plan: -prn xanax per outpt PCP  -cymbalta   Significant Hospital tests/ studies   Procedures    Culture data/antimicrobials      Consults      Discharge Exam: BP 128/73   Pulse 65   Temp 98.2 F (36.8 C) (Axillary)   Resp (!) 8   Ht 5\' 6"  (1.676 m)   Wt 82.2 kg   SpO2 95%   BMI 29.25 kg/m   Awake alert interactive Moist warm close Clear breath sounds Bowel sounds appreciated Lower extremity edema   Labs at discharge   Lab Results  Component Value Date   CREATININE 0.72 11/04/2023   BUN 12 11/04/2023   NA 137 11/04/2023   K 3.6 11/04/2023   CL 97 (L) 11/04/2023   CO2 31 11/04/2023   Lab Results  Component Value Date   WBC 7.9 11/04/2023   HGB 12.3 11/04/2023   HCT 38.1 11/04/2023   MCV 85.2 11/04/2023   PLT 257 11/04/2023   Lab Results  Component Value Date   ALT 14 11/02/2023   AST 33 11/02/2023   ALKPHOS 65 11/02/2023   BILITOT 0.8 11/02/2023   Lab Results  Component Value Date   INR 1.16 05/03/2018    Current radiological studies    EEG adult Result Date: 11/03/2023 Charlsie Quest, MD     11/03/2023 10:08 AM Patient Name: Joan [name] MRN: [id number] Epilepsy Attending: Charlsie Quest Referring Physician/Provider: Lanier Clam, NP Date:  11/03/2023 Duration: 23.04 mins Patient history: 58yo F with ams. EEG to evaluate for seizure Level of alertness: Awake AEDs during EEG study: GBP, TPM, VPA Technical aspects: This EEG study was done with scalp electrodes positioned according to the 10-20 International system of electrode placement. Electrical activity was reviewed with band pass filter of 1-70Hz , sensitivity of 7 uV/mm, display speed of 9mm/sec with a 60Hz  notched filter applied as appropriate. EEG data were recorded continuously and digitally stored.  Video monitoring was available and reviewed as appropriate. Description: The  posterior dominant rhythm consists of 8 Hz activity of moderate voltage (25-35 uV) seen predominantly in posterior head regions, symmetric and reactive to eye opening and eye closing. Hyperventilation and photic stimulation were not performed.   IMPRESSION: This study is within normal limits. No seizures or epileptiform discharges were seen throughout the recording. A normal interictal EEG does not exclude the diagnosis of epilepsy. Charlsie Quest   CT Head Wo Contrast Result Date: 11/02/2023 CLINICAL DATA:  Altered mental status, confusion. EXAM: CT HEAD WITHOUT CONTRAST TECHNIQUE: Contiguous axial images were obtained from the base of the skull through the vertex without intravenous contrast. RADIATION DOSE REDUCTION: This exam was performed according to the departmental dose-optimization program which includes automated exposure control, adjustment of the mA and/or kV according to patient size and/or use of iterative reconstruction technique. COMPARISON:  03/04/2012 FINDINGS: Brain: No acute intracranial abnormality. Specifically, no hemorrhage, hydrocephalus, mass lesion, acute infarction, or significant intracranial injury. Physiologic calcifications within the basal ganglia, stable. Vascular: No hyperdense vessel or unexpected calcification. Skull: No acute calvarial abnormality. Sinuses/Orbits: No acute findings Other: None IMPRESSION: No acute intracranial abnormality. Electronically Signed   By: Charlett Nose M.D.   On: 11/02/2023 23:16   DG Chest Portable 1 View Result Date: 11/02/2023 CLINICAL DATA:  Possible aspiration, recent overdose EXAM: PORTABLE CHEST 1 VIEW COMPARISON:  10/31/2021 FINDINGS: Cardiac shadow is stable. Increased central vascular prominence is noted likely related to the supine positioning. No focal infiltrate or effusion is seen. No bony abnormality is noted. IMPRESSION: Increased central vascular congestion likely positional in nature. Electronically Signed   By: Alcide Clever  M.D.   On: 11/02/2023 22:21    Disposition:    Discharge disposition: 06-Home-Health Care Svc       Discharge Instructions     For home use only DME oxygen   Complete by: As directed    Length of Need: Lifetime   Mode or (Route): Nasal cannula   Liters per Minute: 4   Frequency: Continuous (stationary and portable oxygen unit needed)   Oxygen conserving device: Yes   Oxygen delivery system: Gas       Allergies as of 11/04/2023       Reactions   Gabapentin Other (See Comments), Hypertension   Elevated BP requiring doubling of current meds   Other Other (See Comments)   Unnamed IV meds for migraine, patient stated it made her feel like "something was crawling all over" her body        Medication List     STOP taking these medications    famotidine 20 MG tablet Commonly known as: Pepcid   oxyCODONE 15 MG immediate release tablet Commonly known as: ROXICODONE       TAKE these medications    albuterol (2.5 MG/3ML) 0.083% nebulizer solution Commonly known as: PROVENTIL Take 3 mLs (2.5 mg total) by nebulization every 6 (six) hours as needed for wheezing or shortness of breath.   albuterol 108 (  90 Base) MCG/ACT inhaler Commonly known as: VENTOLIN HFA Inhale 2 puffs into the lungs every 4 (four) hours as needed for wheezing or shortness of breath.   ALPRAZolam 1 MG tablet Commonly known as: XANAX Take 1 mg by mouth 3 (three) times daily.   baclofen 10 MG tablet Commonly known as: LIORESAL Take 10 mg by mouth in the morning, at noon, and at bedtime. Morning, afternon, and   divalproex 500 MG DR tablet Commonly known as: DEPAKOTE Take 1 tablet (500 mg total) by mouth daily.   DULoxetine 60 MG capsule Commonly known as: CYMBALTA Take 1 capsule (60 mg total) by mouth in the morning and at bedtime.   gabapentin 300 MG capsule Commonly known as: NEURONTIN Take 600 mg by mouth 4 (four) times daily.   hydrochlorothiazide 50 MG tablet Commonly known as:  HYDRODIURIL Take 1 tablet (50 mg total) by mouth daily.   lisinopril 20 MG tablet Commonly known as: ZESTRIL Take 20 mg by mouth daily.   mometasone-formoterol 200-5 MCG/ACT Aero Commonly known as: DULERA Inhale 2 puffs into the lungs 2 (two) times daily.   naproxen 500 MG tablet Commonly known as: NAPROSYN Take 500 mg by mouth 2 (two) times daily with a meal.   oxyCODONE-acetaminophen 10-325 MG tablet Commonly known as: PERCOCET Take 1 tablet by mouth every 6 (six) hours as needed for pain.               Durable Medical Equipment  (From admission, onward)           Start     Ordered   11/04/23 0000  For home use only DME oxygen       Question Answer Comment  Length of Need Lifetime   Mode or (Route) Nasal cannula   Liters per Minute 4   Frequency Continuous (stationary and portable oxygen unit needed)   Oxygen conserving device Yes   Oxygen delivery system Gas      11/04/23 0800             Follow-up appointment   F/u w/ PCP in 2 weeks Follow-up with pulmonary in about 4 weeks  Discharge Condition:    stable  Physician Statement:   The Patient was personally examined, the discharge assessment and plan has been personally reviewed and I agree with ACNP Babcock's assessment and plan. 35 minutes minutes of time have been dedicated to discharge assessment, planning and discharge instructions.   Signed: Adewale A Olalere 11/04/2023, 8:00 AM

## 2023-11-03 NOTE — Progress Notes (Signed)
Pt noted to be exhibiting the same kicking/thrashing movements. Per her son, for "10 mins". When this RN was called into the room, the pt was having movements as mentioned above. What I witnessed was 1 min of these movements. After these movements, she was able to orient to me, where she was and who I am. I feel that a turn around so swift after a seizure is odd. Therefore, I asked pt if she was having pain and she endorsed yes w/ description, location, and intensity. Donne Anon informed and no new interventions added.

## 2023-11-03 NOTE — H&P (Signed)
Please see consult note dated 2/16 by Tessie Fass and attested by me.

## 2023-11-04 LAB — BASIC METABOLIC PANEL
Anion gap: 9 (ref 5–15)
BUN: 12 mg/dL (ref 6–20)
CO2: 31 mmol/L (ref 22–32)
Calcium: 8.9 mg/dL (ref 8.9–10.3)
Chloride: 97 mmol/L — ABNORMAL LOW (ref 98–111)
Creatinine, Ser: 0.72 mg/dL (ref 0.44–1.00)
GFR, Estimated: >60 mL/min (ref >60)
Glucose, Bld: 120 mg/dL — ABNORMAL HIGH (ref 70–99)
Potassium: 3.6 mmol/L (ref 3.5–5.1)
Sodium: 137 mmol/L (ref 135–145)

## 2023-11-04 LAB — GLUCOSE, CAPILLARY
Glucose-Capillary: 105 mg/dL — ABNORMAL HIGH (ref 70–99)
Glucose-Capillary: 115 mg/dL — ABNORMAL HIGH (ref 70–99)

## 2023-11-04 LAB — CBC
HCT: 38.1 % (ref 36.0–46.0)
Hemoglobin: 12.3 g/dL (ref 12.0–15.0)
MCH: 27.5 pg (ref 26.0–34.0)
MCHC: 32.3 g/dL (ref 30.0–36.0)
MCV: 85.2 fL (ref 80.0–100.0)
Platelets: 257 10*3/uL (ref 150–400)
RBC: 4.47 MIL/uL (ref 3.87–5.11)
RDW: 13.9 % (ref 11.5–15.5)
WBC: 7.9 10*3/uL (ref 4.0–10.5)
nRBC: 0 % (ref 0.0–0.2)

## 2023-11-04 LAB — TOPIRAMATE LEVEL: Topiramate Lvl: <1.5 ug/mL — ABNORMAL LOW (ref 2.0–25.0)

## 2023-11-04 MED ORDER — MOMETASONE FURO-FORMOTEROL FUM 200-5 MCG/ACT IN AERO: 2.0000 | INHALATION_SPRAY | Freq: Two times a day (BID) | RESPIRATORY_TRACT | 3 refills | Status: AC

## 2023-11-04 NOTE — Discharge Instructions (Signed)
Discharge on 4 L of oxygen  Call with significant concerns  Follow-up with Dr. Sherene Sires in 4 weeks

## 2023-11-04 NOTE — Progress Notes (Addendum)
Pt's PIV taken out, supplemental O2 still at 4L. Pt in no distress at this time of discharge. AVS provided to pt and discharge instructions read to pt. All personal belongings gathered by pt and this RN.  Pt decided to leave AMA despite extensive education about the need for her to be discharged w/ supplemental O2. This education even given to here son , whom seemed very concerned. Dr. Val Eagle and Raynelle Fanning both aware of pt's urgency to leave for personal concerns.  This RN attempted to reach Ms. Connaughton at number provided in order to find out who her previous O2 supplier was, but phone not operating. Raynelle Fanning, RN updated.

## 2023-11-04 NOTE — TOC CM/SW Note (Addendum)
Notified by bedside nurse of need for home oxygen; order just put in by MD. Apparently patient threatening to go AMA, as she has a court appointment today that she cannot miss. Attempted to set up with oxygen provider, but patient went AMA before tank could be delivered.    Upon further investigation, able to find out that patient is active with [employer] for home oxygen @ 5L/[place].  She has a concentrator and the ability to fill her own portable tanks at home.  Tried to call patient at number listed to make sure this is correct, but there was no answer and no voicemail available.   Quintella Baton, RN, BSN  Trauma/Neuro ICU Case Manager [phone]

## 2023-11-04 NOTE — Progress Notes (Signed)
Pt needing to discharge AMA. Pt up to the restroom to change clothes at this time. I have reached out to Dr. Val Eagle to infom.

## 2023-11-20 ENCOUNTER — Inpatient Hospital Stay: Payer: 59 | Admitting: Nurse Practitioner

## 2023-11-20 ENCOUNTER — Encounter: Payer: Self-pay | Admitting: Nurse Practitioner

## 2024-01-28 ENCOUNTER — Telehealth: Payer: Self-pay

## 2024-01-28 NOTE — Telephone Encounter (Signed)
 Called patient,tried getting in contact with her in order to reschedule her appointment,busy signal and unable to lvm will cancel appt

## 2024-01-29 ENCOUNTER — Ambulatory Visit: Admitting: Pulmonary Disease

## 2024-05-11 ENCOUNTER — Other Ambulatory Visit: Payer: Self-pay | Admitting: Radiology

## 2024-05-12 LAB — SURGICAL PATHOLOGY

## 2024-05-18 ENCOUNTER — Ambulatory Visit: Admitting: Nurse Practitioner

## 2024-05-18 DIAGNOSIS — J849 Interstitial pulmonary disease, unspecified: Secondary | ICD-10-CM

## 2024-05-18 DIAGNOSIS — J45901 Unspecified asthma with (acute) exacerbation: Secondary | ICD-10-CM

## 2024-07-04 ENCOUNTER — Other Ambulatory Visit: Payer: Self-pay

## 2024-07-04 ENCOUNTER — Emergency Department (HOSPITAL_COMMUNITY)

## 2024-07-04 ENCOUNTER — Inpatient Hospital Stay (HOSPITAL_COMMUNITY)
Admission: EM | Admit: 2024-07-04 | Discharge: 2024-07-05 | DRG: 193 | Discharge status: Against Medical Advice | Attending: Internal Medicine | Admitting: Internal Medicine

## 2024-07-04 ENCOUNTER — Encounter (HOSPITAL_COMMUNITY): Payer: Self-pay | Admitting: *Deleted

## 2024-07-04 DIAGNOSIS — F419 Anxiety disorder, unspecified: Secondary | ICD-10-CM | POA: Diagnosis present

## 2024-07-04 DIAGNOSIS — J440 Chronic obstructive pulmonary disease with acute lower respiratory infection: Secondary | ICD-10-CM | POA: Diagnosis present

## 2024-07-04 DIAGNOSIS — J441 Chronic obstructive pulmonary disease with (acute) exacerbation: Principal | ICD-10-CM | POA: Diagnosis present

## 2024-07-04 DIAGNOSIS — Z79899 Other long term (current) drug therapy: Secondary | ICD-10-CM

## 2024-07-04 DIAGNOSIS — Z7951 Long term (current) use of inhaled steroids: Secondary | ICD-10-CM

## 2024-07-04 DIAGNOSIS — Z8249 Family history of ischemic heart disease and other diseases of the circulatory system: Secondary | ICD-10-CM

## 2024-07-04 DIAGNOSIS — Z888 Allergy status to other drugs, medicaments and biological substances status: Secondary | ICD-10-CM

## 2024-07-04 DIAGNOSIS — Z833 Family history of diabetes mellitus: Secondary | ICD-10-CM

## 2024-07-04 DIAGNOSIS — J189 Pneumonia, unspecified organism: Principal | ICD-10-CM | POA: Diagnosis present

## 2024-07-04 DIAGNOSIS — I10 Essential (primary) hypertension: Secondary | ICD-10-CM | POA: Diagnosis present

## 2024-07-04 DIAGNOSIS — J9601 Acute respiratory failure with hypoxia: Secondary | ICD-10-CM | POA: Diagnosis present

## 2024-07-04 DIAGNOSIS — Z1152 Encounter for screening for COVID-19: Secondary | ICD-10-CM

## 2024-07-04 DIAGNOSIS — F17210 Nicotine dependence, cigarettes, uncomplicated: Secondary | ICD-10-CM | POA: Diagnosis present

## 2024-07-04 DIAGNOSIS — Z5329 Procedure and treatment not carried out because of patient's decision for other reasons: Secondary | ICD-10-CM | POA: Diagnosis not present

## 2024-07-04 LAB — I-STAT VENOUS BLOOD GAS, ED
Acid-Base Excess: 2 mmol/L (ref 0.0–2.0)
Bicarbonate: 27.8 mmol/L (ref 20.0–28.0)
Calcium, Ion: 1.15 mmol/L (ref 1.15–1.40)
HCT: 41 % (ref 36.0–46.0)
Hemoglobin: 13.9 g/dL (ref 12.0–15.0)
O2 Saturation: 56 %
Potassium: 3.6 mmol/L (ref 3.5–5.1)
Sodium: 140 mmol/L (ref 135–145)
TCO2: 29 mmol/L (ref 22–32)
pCO2, Ven: 44.9 mmHg (ref 44–60)
pH, Ven: 7.4 (ref 7.25–7.43)
pO2, Ven: 30 mmHg — CL (ref 32–45)

## 2024-07-04 LAB — COMPREHENSIVE METABOLIC PANEL WITH GFR
ALT: 12 U/L (ref 0–44)
AST: 23 U/L (ref 15–41)
Albumin: 3.2 g/dL — ABNORMAL LOW (ref 3.5–5.0)
Alkaline Phosphatase: 70 U/L (ref 38–126)
Anion gap: 11 (ref 5–15)
BUN: 6 mg/dL (ref 6–20)
CO2: 26 mmol/L (ref 22–32)
Calcium: 9.2 mg/dL (ref 8.9–10.3)
Chloride: 102 mmol/L (ref 98–111)
Creatinine, Ser: 0.63 mg/dL (ref 0.44–1.00)
GFR, Estimated: >60 mL/min (ref >60)
Glucose, Bld: 102 mg/dL — ABNORMAL HIGH (ref 70–99)
Potassium: 3.6 mmol/L (ref 3.5–5.1)
Sodium: 139 mmol/L (ref 135–145)
Total Bilirubin: 0.9 mg/dL (ref 0.0–1.2)
Total Protein: 7.2 g/dL (ref 6.5–8.1)

## 2024-07-04 LAB — RESP PANEL BY RT-PCR (RSV, FLU A&B, COVID)  RVPGX2
Influenza A by PCR: NEGATIVE
Influenza B by PCR: NEGATIVE
Resp Syncytial Virus by PCR: NEGATIVE
SARS Coronavirus 2 by RT PCR: NEGATIVE

## 2024-07-04 LAB — CBC
HCT: 42.4 % (ref 36.0–46.0)
Hemoglobin: 13.5 g/dL (ref 12.0–15.0)
MCH: 26.9 pg (ref 26.0–34.0)
MCHC: 31.8 g/dL (ref 30.0–36.0)
MCV: 84.5 fL (ref 80.0–100.0)
Platelets: 253 K/uL (ref 150–400)
RBC: 5.02 MIL/uL (ref 3.87–5.11)
RDW: 14.6 % (ref 11.5–15.5)
WBC: 13.6 K/uL — ABNORMAL HIGH (ref 4.0–10.5)
nRBC: 0 % (ref 0.0–0.2)

## 2024-07-04 MED ORDER — ALBUTEROL SULFATE (2.5 MG/3ML) 0.083% IN NEBU
10.0000 mg/h | INHALATION_SOLUTION | Freq: Once | RESPIRATORY_TRACT | Status: AC
  Administered 2024-07-04: 10 mg/h via RESPIRATORY_TRACT
  Filled 2024-07-04: qty 12

## 2024-07-04 MED ORDER — IPRATROPIUM-ALBUTEROL 0.5-2.5 (3) MG/3ML IN SOLN
3.0000 mL | Freq: Once | RESPIRATORY_TRACT | Status: DC
  Filled 2024-07-04: qty 3

## 2024-07-04 MED ORDER — HYDROCHLOROTHIAZIDE 25 MG PO TABS
50.0000 mg | ORAL_TABLET | Freq: Every day | ORAL | Status: DC
  Administered 2024-07-05: 50 mg via ORAL
  Filled 2024-07-04 (×2): qty 2

## 2024-07-04 MED ORDER — SODIUM CHLORIDE 0.9 % IV SOLN
2.0000 g | Freq: Once | INTRAVENOUS | Status: AC
  Administered 2024-07-04: 2 g via INTRAVENOUS
  Filled 2024-07-04: qty 20

## 2024-07-04 MED ORDER — SODIUM CHLORIDE 0.9 % IV SOLN
100.0000 mg | Freq: Two times a day (BID) | INTRAVENOUS | Status: DC
  Administered 2024-07-05: 100 mg via INTRAVENOUS
  Filled 2024-07-04: qty 100

## 2024-07-04 MED ORDER — IPRATROPIUM-ALBUTEROL 0.5-2.5 (3) MG/3ML IN SOLN
3.0000 mL | Freq: Once | RESPIRATORY_TRACT | Status: AC
  Administered 2024-07-04: 3 mL via RESPIRATORY_TRACT

## 2024-07-04 MED ORDER — ALBUTEROL SULFATE HFA 108 (90 BASE) MCG/ACT IN AERS
1.0000 | INHALATION_SPRAY | Freq: Once | RESPIRATORY_TRACT | Status: DC
  Filled 2024-07-04: qty 6.7

## 2024-07-04 MED ORDER — NAPROXEN 250 MG PO TABS
500.0000 mg | ORAL_TABLET | Freq: Two times a day (BID) | ORAL | Status: DC
  Administered 2024-07-04 – 2024-07-05 (×2): 500 mg via ORAL
  Filled 2024-07-04 (×2): qty 2

## 2024-07-04 MED ORDER — DULOXETINE HCL 30 MG PO CPEP
60.0000 mg | ORAL_CAPSULE | Freq: Two times a day (BID) | ORAL | Status: DC
  Administered 2024-07-04 – 2024-07-05 (×2): 60 mg via ORAL
  Filled 2024-07-04 (×2): qty 2

## 2024-07-04 MED ORDER — METHYLPREDNISOLONE SODIUM SUCC 125 MG IJ SOLR
125.0000 mg | Freq: Once | INTRAMUSCULAR | Status: AC
  Administered 2024-07-04: 125 mg via INTRAVENOUS
  Filled 2024-07-04: qty 2

## 2024-07-04 MED ORDER — ALPRAZOLAM 0.5 MG PO TABS
1.0000 mg | ORAL_TABLET | Freq: Three times a day (TID) | ORAL | Status: DC | PRN
  Administered 2024-07-04: 1 mg via ORAL
  Filled 2024-07-04: qty 4

## 2024-07-04 MED ORDER — IPRATROPIUM-ALBUTEROL 0.5-2.5 (3) MG/3ML IN SOLN
3.0000 mL | RESPIRATORY_TRACT | Status: DC | PRN
Start: 2024-07-04 — End: 2024-07-05
  Filled 2024-07-04: qty 3

## 2024-07-04 MED ORDER — SODIUM CHLORIDE 0.9% FLUSH
3.0000 mL | Freq: Two times a day (BID) | INTRAVENOUS | Status: DC
  Administered 2024-07-04 – 2024-07-05 (×2): 3 mL via INTRAVENOUS

## 2024-07-04 MED ORDER — ENOXAPARIN SODIUM 40 MG/0.4ML IJ SOSY
40.0000 mg | PREFILLED_SYRINGE | INTRAMUSCULAR | Status: DC
  Administered 2024-07-04: 40 mg via SUBCUTANEOUS
  Filled 2024-07-04: qty 0.4

## 2024-07-04 MED ORDER — OXYCODONE HCL 5 MG PO TABS
5.0000 mg | ORAL_TABLET | Freq: Four times a day (QID) | ORAL | Status: DC | PRN
Start: 2024-07-04 — End: 2024-07-05
  Filled 2024-07-04: qty 1

## 2024-07-04 MED ORDER — NICOTINE 21 MG/24HR TD PT24
21.0000 mg | MEDICATED_PATCH | Freq: Every day | TRANSDERMAL | Status: DC
  Administered 2024-07-05: 21 mg via TRANSDERMAL
  Filled 2024-07-04 (×2): qty 1

## 2024-07-04 MED ORDER — OXYCODONE-ACETAMINOPHEN 5-325 MG PO TABS
1.0000 | ORAL_TABLET | Freq: Four times a day (QID) | ORAL | Status: DC | PRN
Start: 2024-07-04 — End: 2024-07-05
  Filled 2024-07-04: qty 1

## 2024-07-04 MED ORDER — OXYCODONE-ACETAMINOPHEN 10-325 MG PO TABS: 1.0000 | ORAL_TABLET | Freq: Four times a day (QID) | ORAL | Status: DC | PRN

## 2024-07-04 MED ORDER — ACETAMINOPHEN 325 MG PO TABS: 650.0000 mg | ORAL_TABLET | Freq: Four times a day (QID) | ORAL | Status: DC | PRN

## 2024-07-04 MED ORDER — SODIUM CHLORIDE 0.9 % IV SOLN: 250.0000 mL | INTRAVENOUS | Status: DC | PRN

## 2024-07-04 MED ORDER — SODIUM CHLORIDE 0.9% FLUSH: 3.0000 mL | INTRAVENOUS | Status: DC | PRN

## 2024-07-04 MED ORDER — METHYLPREDNISOLONE SODIUM SUCC 40 MG IJ SOLR
40.0000 mg | Freq: Two times a day (BID) | INTRAMUSCULAR | Status: DC
  Administered 2024-07-05: 40 mg via INTRAVENOUS
  Filled 2024-07-04: qty 1

## 2024-07-04 MED ORDER — SODIUM CHLORIDE 0.9 % IV SOLN: 1.0000 g | INTRAVENOUS | Status: DC

## 2024-07-04 MED ORDER — METHYLPREDNISOLONE SODIUM SUCC 125 MG IJ SOLR: 125.0000 mg | Freq: Once | INTRAMUSCULAR | Status: DC

## 2024-07-04 MED ORDER — DIVALPROEX SODIUM 250 MG PO DR TAB
500.0000 mg | DELAYED_RELEASE_TABLET | Freq: Two times a day (BID) | ORAL | Status: DC
  Administered 2024-07-04 – 2024-07-05 (×2): 500 mg via ORAL
  Filled 2024-07-04 (×2): qty 2

## 2024-07-04 MED ORDER — ACETAMINOPHEN 650 MG RE SUPP: 650.0000 mg | Freq: Four times a day (QID) | RECTAL | Status: DC | PRN

## 2024-07-04 MED ORDER — LISINOPRIL 20 MG PO TABS
20.0000 mg | ORAL_TABLET | Freq: Every day | ORAL | Status: DC
  Administered 2024-07-05: 20 mg via ORAL
  Filled 2024-07-04 (×2): qty 1

## 2024-07-04 MED ORDER — HYDRALAZINE HCL 20 MG/ML IJ SOLN: 5.0000 mg | Freq: Four times a day (QID) | INTRAMUSCULAR | Status: DC | PRN

## 2024-07-04 MED ORDER — SODIUM CHLORIDE 0.9 % IV SOLN
500.0000 mg | Freq: Once | INTRAVENOUS | Status: AC
  Administered 2024-07-04: 500 mg via INTRAVENOUS
  Filled 2024-07-04: qty 5

## 2024-07-04 MED ORDER — BACLOFEN 10 MG PO TABS
10.0000 mg | ORAL_TABLET | Freq: Three times a day (TID) | ORAL | Status: DC
  Administered 2024-07-04 – 2024-07-05 (×2): 10 mg via ORAL
  Filled 2024-07-04 (×2): qty 1

## 2024-07-04 MED ORDER — POLYETHYLENE GLYCOL 3350 17 G PO PACK: 17.0000 g | PACK | Freq: Every day | ORAL | Status: DC | PRN

## 2024-07-04 MED ORDER — GABAPENTIN 300 MG PO CAPS
600.0000 mg | ORAL_CAPSULE | Freq: Three times a day (TID) | ORAL | Status: DC
  Administered 2024-07-04 – 2024-07-05 (×2): 600 mg via ORAL
  Filled 2024-07-04 (×2): qty 2

## 2024-07-04 MED ORDER — ALBUTEROL SULFATE (2.5 MG/3ML) 0.083% IN NEBU: 2.5000 mg | INHALATION_SOLUTION | RESPIRATORY_TRACT | Status: DC | PRN

## 2024-07-04 NOTE — ED Notes (Signed)
 Pt diaphoretic, denies CP, increased SOB or feeling hot. Pt's sleeping. Attending paged to notify.

## 2024-07-04 NOTE — ED Triage Notes (Signed)
 Pt BIB EMS from home for SOB. Hx of COPD, asthma, and HTN. EMS reports initial Spo2 90% room air. EMS placed pt on 4L [place] and administered 5 of albuterol  PTA. Pt tachypnic upon arrival with diminished lung sounds.   EMS Vitals  BP 122/82 HR 80 Spo2 96% 4L  CBG 97

## 2024-07-04 NOTE — ED Provider Notes (Signed)
 [place] EMERGENCY DEPARTMENT AT [facility] Provider Note   CSN: [id number] Arrival date & time: 07/04/24  1424     Patient presents with: Shortness of Breath   Joan [name] is a 58 y.o. female.    Shortness of Breath    58 year old female with medical history significant for COPD who presents to the emergency department with concern for COPD exacerbation versus upper respiratory infection.  The patient states that her husband was just admitted to the hospital for pneumonia this past Friday.  She has had subjective fevers, productive cough for the past 2 days with shortness of breath that worsened last night.  She states that her home nebulizer was not working so she has only been doing inhalers.  She denies any chest pain, denies any abdominal pain, nausea or vomiting or diarrhea.  Prior to Admission medications   Medication Sig Start Date End Date Taking? Authorizing Provider  albuterol  (PROVENTIL ) (2.5 MG/3ML) 0.083% nebulizer solution Take 3 mLs (2.5 mg total) by nebulization every 6 (six) hours as needed for wheezing or shortness of breath. Patient not taking: Reported on 11/03/2023 03/18/20 01/07/22  Christobal Guadalajara, MD  albuterol  (VENTOLIN  HFA) 108 (90 Base) MCG/ACT inhaler Inhale 2 puffs into the lungs every 4 (four) hours as needed for wheezing or shortness of breath. 02/05/22   Darlean Ozell NOVAK, MD  ALPRAZolam  (XANAX ) 1 MG tablet Take 1 mg by mouth 3 (three) times daily.    [provider]  baclofen  (LIORESAL ) 10 MG tablet Take 10 mg by mouth in the morning, at noon, and at bedtime. Morning, afternon, and    [provider]  divalproex  (DEPAKOTE ) 500 MG DR tablet Take 1 tablet (500 mg total) by mouth daily. 06/07/21   Vann, Jessica U, DO  DULoxetine  (CYMBALTA ) 60 MG capsule Take 1 capsule (60 mg total) by mouth in the morning and at bedtime. 06/07/21   Vann, Jessica U, DO  gabapentin  (NEURONTIN ) 300 MG capsule Take 600 mg by mouth 4 (four) times daily.  05/14/16   [provider]  hydrochlorothiazide  (HYDRODIURIL ) 50 MG tablet Take 1 tablet (50 mg total) by mouth daily. 06/07/21   Vann, Jessica U, DO  lisinopril  (ZESTRIL ) 20 MG tablet Take 20 mg by mouth daily. 01/07/20   [provider]  mometasone -formoterol  (DULERA ) 200-5 MCG/ACT AERO Inhale 2 puffs into the lungs 2 (two) times daily. 11/04/23   Neda Jennet LABOR, MD  naproxen  (NAPROSYN ) 500 MG tablet Take 500 mg by mouth 2 (two) times daily with a meal. 09/15/20   [provider]  oxyCODONE -acetaminophen  (PERCOCET) 10-325 MG tablet Take 1 tablet by mouth every 6 (six) hours as needed for pain.    [provider]    Allergies: Gabapentin  and Other    Review of Systems  Respiratory:  Positive for shortness of breath.   All other systems reviewed and are negative.   Updated Vital Signs BP 123/81 (BP Location: Right Arm)   Pulse 94   Temp 98.5 F (36.9 C) (Oral)   Resp (!) 57   SpO2 92%   Physical Exam Vitals and nursing note reviewed.  Constitutional:      General: She is not in acute distress.    Appearance: She is well-developed.  HENT:     Head: Normocephalic and atraumatic.  Eyes:     Conjunctiva/sclera: Conjunctivae normal.  Cardiovascular:     Rate and Rhythm: Normal rate and regular rhythm.     Heart sounds: No murmur  heard. Pulmonary:     Effort: Tachypnea and respiratory distress present.     Breath sounds: Decreased breath sounds and wheezing present.     Comments: Lungs sound tight on examination Abdominal:     Palpations: Abdomen is soft.     Tenderness: There is no abdominal tenderness.  Musculoskeletal:        General: No swelling.     Cervical back: Neck supple.  Skin:    General: Skin is warm and dry.     Capillary Refill: Capillary refill takes less than 2 seconds.  Neurological:     Mental Status: She is alert.  Psychiatric:        Mood and Affect: Mood normal.     (all labs ordered are listed, but only  abnormal results are displayed) Labs Reviewed  RESP PANEL BY RT-PCR (RSV, FLU A&B, COVID)  [id number]  COMPREHENSIVE METABOLIC PANEL WITH GFR  CBC    EKG: None  Radiology: No results found.   Procedures   Medications Ordered in the ED  ipratropium-albuterol  (DUONEB) 0.5-2.5 (3) MG/3ML nebulizer solution 3 mL (has no administration in time range)  methylPREDNISolone  sodium succinate (SOLU-MEDROL ) 125 mg/2 mL injection 125 mg (has no administration in time range)  ipratropium-albuterol  (DUONEB) 0.5-2.5 (3) MG/3ML nebulizer solution 3 mL (has no administration in time range)  albuterol  (VENTOLIN  HFA) 108 (90 Base) MCG/ACT inhaler 1 puff (has no administration in time range)    Clinical Course as of 07/04/24 1527  Sun Jul 04, 2024  1510 Received sign out from Dr. Jerrol pending labs, re-assessment, further tx. Pw symptoms of COPD exacerbation  [WS]    Clinical Course User Index [WS] Francesca Elsie CROME, MD                                 Medical Decision Making Amount and/or Complexity of Data Reviewed Labs: ordered. Radiology: ordered.  Risk Prescription drug management.    58 year old female with medical history significant for COPD who presents to the emergency department with concern for COPD exacerbation versus upper respiratory infection.  The patient states that her husband was just admitted to the hospital for pneumonia this past Friday.  She has had subjective fevers, productive cough for the past 2 days with shortness of breath that worsened last night.  She states that her home nebulizer was not working so she has only been doing inhalers.  She denies any chest pain, denies any abdominal pain, nausea or vomiting or diarrhea.  On arrival, the patient was afebrile, not tachycardic heart rate 94, tachypneic RR 57, BP 123/81, saturating 92% on 4 L O2 via nasal cannula, escalated to 5 L.  Presenting with expiratory wheezing present, lungs appeared tight on auscultation.   Concern for COPD exacerbation.  IV access was obtained and the patient was administered IV Solu-Medrol  in addition to initiation of continuous nebulizer treatment.  Labs: CBC with leukocytosis 13.6, BMP pending, COVID flu and RSV PCR testing negative, VBG without an alkalosis or acidosis, pCO2 normal, CMP generally unremarkable.   A chest x-ray read was pending at time of signout.  Plan to follow-up results of remaining diagnostic testing, treat for presumed COPD exacerbation, plan for likely admission status post reassessment.  Signout given to Dr. Francesca at 1500.     Final diagnoses:  None    ED Discharge Orders     None  Jerrol Agent, MD 07/04/24 [phone]

## 2024-07-04 NOTE — ED Notes (Signed)
 Pt assisted to bedside commode and took off nasal cannula. Pt stated I needed a break.

## 2024-07-04 NOTE — ED Notes (Signed)
 Attempted to give pt PRN pain medication. Pt stated this is not my dose that won't work for me.

## 2024-07-04 NOTE — ED Provider Triage Note (Addendum)
 Emergency Medicine Provider Triage Evaluation Note  Joan [name] , a 58 y.o. female  was evaluated in triage.  Pt complains of cough x 2 days.  SOB started last night.  History of COPD/asthma.  Endorses subjective fevers as well.  Does not have rescue inhaler at home.  Denies chest pain, vomiting, diarrhea.  Lungs are very tight on auscultation.  Review of Systems  Positive:  Negative:   Physical Exam  There were no vitals taken for this visit. Gen:   Awake, no distress   Resp:  Normal effort  MSK:   Moves extremities without difficulty  Other:    Medical Decision Making  Medically screening exam initiated at 2:29 PM.  Appropriate orders placed.  Joan [name] was informed that the remainder of the evaluation will be completed by another provider, this initial triage assessment does not replace that evaluation, and the importance of remaining in the ED until their evaluation is complete.     Hoy Nidia FALCON, [place] 07/04/24 1429    Hoy Nidia FALCON, [place] 07/04/24 1433

## 2024-07-04 NOTE — Hospital Course (Signed)
 Patient is a 58 year old female with past medical history of COPD, anemia, GERD, asthma, hypertension presented to the hospital with subjective fever productive cough and shortness of breath worse for the last day or 2.  Of note patient was recently admitted to the hospital for pneumonia.  At home patient was using her nebulizer but was not getting much relief so she decided to come to the hospital.  Patient denied any chest pain, nausea, vomiting, diarrhea.  In the ED, patient was noted to be in respiratory distress with extensive wheezing.  She was  tachypneic.  Pulse ox was 92% on 4 L of oxygen by nasal cannula but afebrile.  Labs were notable for WBC elevated at 13.6.  Venous blood gas with pCO2 of 30.  BMP was notable for albumin low at 3.2.  COVID influenza and RSV was negative.  Chest x-ray showed pulmonary vascular congestion with interstitial prominence and increasing airspace opacities at the left  lung base.  Patient received DuoNebs, Solu-Medrol , Rocephin  and Zithromax  and was considered for admission to the hospital for further evaluation and treatment.   Left basal pneumonia.  Patient received Rocephin  and Zithromax  in the ED will continue while in hospital.  Continue oxygen nebulizers and steroids.  Acute COPD exacerbation likely secondary to pneumonia.  COVID influenza and RSV was negative.  Continue oxygen nebulizer steroids antibiotics with Rocephin  and Zithromax .  Patient is on Dulera , albuterol  nebulizer at home.  History of hypertension.  Continue HCTZ, lisinopril .  Chronic cigarette smoker.  Counseling done.  Put the patient on nicotine  patch  History of anxiety.  On Xanax , duloxetine  at home.  Will continue.  History of complex regional syndrome.  Continue gabapentin , oxycodone , naproxen .

## 2024-07-04 NOTE — H&P (Signed)
 [facility] History and Physical  Joan [name] FMW:[id number] DOB: Oct 28, 1965 DOA: 07/04/2024  Referring physician: ED  PCP: Shelda Atlas, MD   Patient is coming from: Home  Chief Complaint: Shortness of breath and wheezing  HPI:  Patient is a 58 year old female with past medical history of COPD, anemia, GERD, asthma, hypertension presented to the hospital with subjective fever productive cough and shortness of breath worse for the last day or two.  She stated that her husband was admitted recently for pneumonia and her kid was also having some viral syndrome.  Patient complains of difficulty completing sentences.  Complains of green  phlegm production with chest wall pain on coughing and taking a deep breath.  Denies any syncope lightheadedness.  Denies any abdominal pain constipation or diarrhea.  Denies urinary urgency frequency or dysuria.    In the ED, patient was noted to be in respiratory distress with extensive wheezing.  She was  tachypneic.  Pulse ox was 92% on 4 L of oxygen by nasal cannula but afebrile.  Labs were notable for WBC elevated at 13.6.  Venous blood gas with pCO2 of 30.  BMP was notable for albumin low at 3.2.  COVID influenza and RSV was negative.  Chest x-ray showed pulmonary vascular congestion with interstitial prominence and increasing airspace opacities at the left  lung base.  Patient received DuoNebs, Solu-Medrol , Rocephin  and Zithromax  and was considered for admission to the hospital for further evaluation and treatment.   Assessment and Plan Principal Problem:   COPD with acute exacerbation (HCC)  Left basal pneumonia.  Patient received Rocephin  and Zithromax  in the ED will continue while in hospital.  Continue oxygen nebulizers and IV steroids.  Patient is afebrile at this time.  Check Legionella and streptococcal urinary antigen.  Check respiratory viral panel.  COVID influenza and RSV was negative.  Acute COPD exacerbation likely secondary to  pneumonia.  COVID influenza and RSV was negative.  Continue oxygen nebulizer, IV steroids, inhaled steroids.  Antibiotics with Rocephin  and Zithromax .  Patient is on Dulera , albuterol  nebulizer at home.  Has history of asthma as well.  Check respiratory viral panel.  History of hypertension.  Continue HCTZ, lisinopril .  Closely monitor.  Chronic cigarette smoker.  Counseling done.  Will put the patient on nicotine  patch.  Patient is not that motivated to quit smoking at this time.  History of anxiety.  On Xanax , duloxetine  at home.  Will continue.  History of complex regional syndrome.  Continue gabapentin , oxycodone , naproxen .  DVT Prophylaxis: Lovenox  subcu  Review of Systems:  All systems were reviewed and were negative unless otherwise mentioned in the HPI   Past Medical History:  Diagnosis Date   Anemia    Anxiety    Asthma    Chronic back pain    Chronic neck pain    Complex regional pain syndrome    COPD (chronic obstructive pulmonary disease) (HCC)    GERD (gastroesophageal reflux disease)    Hematuria 09/07/2013   Hypertension    IBS (irritable bowel syndrome)    Migraine    Paresthesia of left arm    Past Surgical History:  Procedure Laterality Date   BIOPSY  10/31/2021   Procedure: BIOPSY;  Surgeon: Alaine Vicenta NOVAK, MD;  Location: [facility] ENDOSCOPY;  Service: Cardiopulmonary;;   BRONCHIAL NEEDLE ASPIRATION BIOPSY  10/31/2021   Procedure: BRONCHIAL NEEDLE ASPIRATION BIOPSIES;  Surgeon: Alaine Vicenta NOVAK, MD;  Location: [facility] ENDOSCOPY;  Service: Cardiopulmonary;;   BRONCHIAL WASHINGS  10/31/2021  Procedure: BRONCHIAL WASHINGS;  Surgeon: Alaine Vicenta NOVAK, MD;  Location: [facility] ENDOSCOPY;  Service: Cardiopulmonary;;   DILITATION & CURRETTAGE/HYSTROSCOPY WITH THERMACHOICE ABLATION N/A 08/17/2013   Procedure: DILATATION & CURETTAGE/HYSTEROSCOPY WITH THERMACHOICE ABLATION;  Surgeon: Norleen LULLA Server, MD;  Location: [facility] ORS;  Service: Gynecology;  Laterality: N/A;  18 ml D5W in &  out; total therapy time= 9 minutes 33 seconds; temp= 87 degrees celcius   HERNIA REPAIR     ventral hernia   POLYPECTOMY N/A 08/17/2013   Procedure: REMOVAL ENDOMETRIAL POLYP;  Surgeon: Norleen LULLA Server, MD;  Location: [facility] ORS;  Service: Gynecology;  Laterality: N/A;   TUBAL LIGATION     ULNAR NERVE REPAIR Left    [facility] 2013   VIDEO BRONCHOSCOPY WITH ENDOBRONCHIAL ULTRASOUND N/A 10/31/2021   Procedure: VIDEO BRONCHOSCOPY WITH ENDOBRONCHIAL ULTRASOUND;  Surgeon: Alaine Vicenta NOVAK, MD;  Location: [facility] ENDOSCOPY;  Service: Cardiopulmonary;  Laterality: N/A;    Social History:  reports that she has been smoking cigarettes. She has a 26 pack-year smoking history. She has never used smokeless tobacco. She reports that she does not drink alcohol and does not use drugs.  Allergies  Allergen Reactions   Gabapentin  Other (See Comments) and Hypertension    Elevated BP requiring doubling of current meds   Other Other (See Comments)    Unnamed IV meds for migraine, patient stated it made her feel like something was crawling all over her body    Family History  Problem Relation Age of Onset   Diabetes Sister    Hypertension Sister    Heart disease Sister    Cancer Sister        brain tumor   Heart disease Brother    Diabetes Brother    Cancer Maternal Aunt        uterine   Cancer Maternal Grandmother        uterine   Other Mother        car accident     Prior to Admission medications   Medication Sig Start Date End Date Taking? Authorizing Provider  albuterol  (PROVENTIL ) (2.5 MG/3ML) 0.083% nebulizer solution Take 3 mLs (2.5 mg total) by nebulization every 6 (six) hours as needed for wheezing or shortness of breath. Patient not taking: Reported on 11/03/2023 03/18/20 01/07/22  Christobal Guadalajara, MD  albuterol  (VENTOLIN  HFA) 108 (90 Base) MCG/ACT inhaler Inhale 2 puffs into the lungs every 4 (four) hours as needed for wheezing or shortness of breath. 02/05/22   Darlean Ozell NOVAK, MD  ALPRAZolam  (XANAX ) 1  MG tablet Take 1 mg by mouth 3 (three) times daily.    [provider]  baclofen  (LIORESAL ) 10 MG tablet Take 10 mg by mouth in the morning, at noon, and at bedtime. Morning, afternon, and    [provider]  divalproex  (DEPAKOTE ) 500 MG DR tablet Take 1 tablet (500 mg total) by mouth daily. 06/07/21   Vann, Jessica U, DO  DULoxetine  (CYMBALTA ) 60 MG capsule Take 1 capsule (60 mg total) by mouth in the morning and at bedtime. 06/07/21   Vann, Jessica U, DO  gabapentin  (NEURONTIN ) 300 MG capsule Take 600 mg by mouth 4 (four) times daily. 05/14/16   [provider]  hydrochlorothiazide  (HYDRODIURIL ) 50 MG tablet Take 1 tablet (50 mg total) by mouth daily. 06/07/21   Vann, Jessica U, DO  lisinopril  (ZESTRIL ) 20 MG tablet Take 20 mg by mouth daily. 01/07/20   [provider]  mometasone -formoterol  (DULERA ) 200-5 MCG/ACT AERO Inhale 2 puffs  into the lungs 2 (two) times daily. 11/04/23   Neda Jennet LABOR, MD  naproxen  (NAPROSYN ) 500 MG tablet Take 500 mg by mouth 2 (two) times daily with a meal. 09/15/20   [provider]  oxyCODONE -acetaminophen  (PERCOCET) 10-325 MG tablet Take 1 tablet by mouth every 6 (six) hours as needed for pain.    [provider]    Physical Exam:  Vitals:   07/04/24 1430 07/04/24 1436 07/04/24 1503 07/04/24 1556  BP: 123/81   118/73  Pulse: 94   91  Resp: (!) 57   (!) 30  Temp: 98.5 F (36.9 C)     TempSrc: Oral     SpO2: 92%  92% 96%  Weight:  82.2 kg    Height:  5' 6 (1.676 m)     Wt Readings from Last 3 Encounters:  07/04/24 82.2 kg  11/04/23 82.2 kg  01/07/22 94.7 kg   Body mass index is 29.25 kg/m.  General:  Average built, in mild respiratory distress, on nasal cannula oxygen, HENT: Normocephalic, No scleral pallor or icterus noted. Oral mucosa is moist.  Chest: Diminished breath sounds bilaterally, coarse breath sounds noted, bilateral expiratory wheezes with prolonged expiration. CVS: S1 &S2 heard. No  murmur.  Regular rate and rhythm. Abdomen: Soft, nontender, nondistended.  Bowel sounds are heard. No abdominal mass palpated Extremities: No cyanosis, clubbing or edema.  Peripheral pulses are palpable. Psych: Alert, awake and oriented, mildly anxious CNS:  No cranial nerve deficits.  Moves all extremities. Skin: Warm and dry.  No rashes noted.  Labs on Admission:   CBC: Recent Labs  Lab 07/04/24 1430 07/04/24 1502  WBC 13.6*  --   HGB 13.5 13.9  HCT 42.4 41.0  MCV 84.5  --   PLT 253  --     Basic Metabolic Panel: Recent Labs  Lab 07/04/24 1430 07/04/24 1502  NA 139 140  K 3.6 3.6  CL 102  --   CO2 26  --   GLUCOSE 102*  --   BUN 6  --   CREATININE 0.63  --   CALCIUM 9.2  --     Liver Function Tests: Recent Labs  Lab 07/04/24 1430  AST 23  ALT 12  ALKPHOS 70  BILITOT 0.9  PROT 7.2  ALBUMIN 3.2*   No results for input(s): LIPASE, AMYLASE in the last 168 hours. No results for input(s): AMMONIA in the last 168 hours.  Cardiac Enzymes: No results for input(s): CKTOTAL, CKMB, CKMBINDEX, TROPONINI in the last 168 hours.  BNP (last 3 results) No results for input(s): BNP in the last 8760 hours.  ProBNP (last 3 results) No results for input(s): PROBNP in the last 8760 hours.  CBG: No results for input(s): GLUCAP in the last 168 hours.  Lipase     Component Value Date/Time   LIPASE 19 07/31/2015 2036     Urinalysis    Component Value Date/Time   COLORURINE STRAW (A) 11/03/2023 0132   APPEARANCEUR CLEAR 11/03/2023 0132   LABSPEC 1.006 11/03/2023 0132   PHURINE 6.0 11/03/2023 0132   GLUCOSEU NEGATIVE 11/03/2023 0132   HGBUR SMALL (A) 11/03/2023 0132   BILIRUBINUR NEGATIVE 11/03/2023 0132   KETONESUR NEGATIVE 11/03/2023 0132   PROTEINUR NEGATIVE 11/03/2023 0132   UROBILINOGEN 1.0 07/31/2015 2028   NITRITE NEGATIVE 11/03/2023 0132   LEUKOCYTESUR NEGATIVE 11/03/2023 0132     Drugs of Abuse     Component Value Date/Time    LABOPIA NONE DETECTED 11/03/2023 0132  COCAINSCRNUR NONE DETECTED 11/03/2023 0132   LABBENZ NONE DETECTED 11/03/2023 0132   AMPHETMU NONE DETECTED 11/03/2023 0132   THCU NONE DETECTED 11/03/2023 0132   LABBARB NONE DETECTED 11/03/2023 0132      Radiological Exams on Admission: DG Chest Portable 1 View Result Date: 07/04/2024 CLINICAL DATA:  Shortness of breath, history of COPD. EXAM: PORTABLE CHEST 1 VIEW COMPARISON:  None Available. FINDINGS: The heart size and mediastinal contours are stable. The pulmonary vasculature is distended with interstitial prominence bilaterally. Increased airspace disease is noted at the left lung base. No effusion or pneumothorax is seen. IMPRESSION: 1. Pulmonary vascular congestion. 2. Interstitial prominence bilaterally and increasing airspace disease at the left lung base, possible edema or infiltrate. Electronically Signed   By: Leita Birmingham M.D.   On: 07/04/2024 15:53    EKG: Personally reviewed by me which shows normal sinus rhythm   Consultant: None  Code Status: Full code  Microbiology none  Antibiotics: Rocephin  and doxycycline   Family Communication:  Patients' condition and plan of care including tests being ordered have been discussed with the patient and the patient's son at bedside who indicate understanding and agree with the plan.   Status is: Inpatient   Severity of Illness: The appropriate patient status for this patient is INPATIENT. Inpatient status is judged to be reasonable and necessary in order to provide the required intensity of service to ensure the patient's safety. The patient's presenting symptoms, physical exam findings, and initial radiographic and laboratory data in the context of their chronic comorbidities is felt to place them at high risk for further clinical deterioration. Furthermore, it is not anticipated that the patient will be medically stable for discharge from the hospital within 2 midnights of admission.    * I certify that at the point of admission it is my clinical judgment that the patient will require inpatient hospital care spanning beyond 2 midnights from the point of admission due to high intensity of service, high risk for further deterioration and high frequency of surveillance required.*  Signed, Vernal Alstrom, MD [facility] 07/04/2024

## 2024-07-04 NOTE — ED Notes (Signed)
 CCMD notified

## 2024-07-05 DIAGNOSIS — J441 Chronic obstructive pulmonary disease with (acute) exacerbation: Secondary | ICD-10-CM | POA: Diagnosis not present

## 2024-07-05 LAB — CBC
HCT: 39 % (ref 36.0–46.0)
Hemoglobin: 12.3 g/dL (ref 12.0–15.0)
MCH: 26.5 pg (ref 26.0–34.0)
MCHC: 31.5 g/dL (ref 30.0–36.0)
MCV: 83.9 fL (ref 80.0–100.0)
Platelets: 262 K/uL (ref 150–400)
RBC: 4.65 MIL/uL (ref 3.87–5.11)
RDW: 14.5 % (ref 11.5–15.5)
WBC: 13.9 K/uL — ABNORMAL HIGH (ref 4.0–10.5)
nRBC: 0 % (ref 0.0–0.2)

## 2024-07-05 LAB — BASIC METABOLIC PANEL WITH GFR
Anion gap: 10 (ref 5–15)
BUN: 13 mg/dL (ref 6–20)
CO2: 24 mmol/L (ref 22–32)
Calcium: 8.1 mg/dL — ABNORMAL LOW (ref 8.9–10.3)
Chloride: 107 mmol/L (ref 98–111)
Creatinine, Ser: 0.62 mg/dL (ref 0.44–1.00)
GFR, Estimated: >60 mL/min (ref >60)
Glucose, Bld: 124 mg/dL — ABNORMAL HIGH (ref 70–99)
Potassium: 3.6 mmol/L (ref 3.5–5.1)
Sodium: 141 mmol/L (ref 135–145)

## 2024-07-05 LAB — MAGNESIUM: Magnesium: 2 mg/dL (ref 1.7–2.4)

## 2024-07-05 MED ORDER — DOXYCYCLINE HYCLATE 100 MG PO TABS
100.0000 mg | ORAL_TABLET | Freq: Two times a day (BID) | ORAL | Status: DC
Start: 2024-07-05 — End: 2024-07-05

## 2024-07-05 MED ORDER — OXYCODONE HCL 5 MG PO TABS
10.0000 mg | ORAL_TABLET | Freq: Four times a day (QID) | ORAL | Status: DC | PRN
  Administered 2024-07-05: 10 mg via ORAL
  Filled 2024-07-05: qty 2

## 2024-07-05 MED ORDER — ACETAMINOPHEN 325 MG PO TABS: 325.0000 mg | ORAL_TABLET | Freq: Four times a day (QID) | ORAL | Status: DC | PRN

## 2024-07-05 MED ORDER — OXYCODONE-ACETAMINOPHEN 10-325 MG PO TABS: 1.0000 | ORAL_TABLET | Freq: Four times a day (QID) | ORAL | Status: DC | PRN

## 2024-07-05 MED ORDER — NICOTINE POLACRILEX 2 MG MT GUM: 2.0000 mg | CHEWING_GUM | OROMUCOSAL | Status: DC | PRN

## 2024-07-05 NOTE — ED Notes (Signed)
 Pt requesting to leave AMA. Pt educated on risks of leaving and benefits of staying. Pt A&Ox4 and ambulatory with steady gait. Pt leaving with family member. Pt signed AMA form. PIV removed.

## 2024-07-05 NOTE — Discharge Summary (Addendum)
 Physician Discharge Summary  Joan [name] FMW:[id number] DOB: [birth year] DOA: 07/04/2024  PCP: Shelda Atlas, MD  Admit date: 07/04/2024 Discharge date: 07/05/2024  Admitted From: Home  Discharge disposition: Left AGAINST MEDICAL ADVICE   Recommendations for Outpatient Follow-Up:  Left AGAINST MEDICAL ADVICE  Discharge Diagnosis:   Principal Problem:   COPD with acute exacerbation [facility])    Discharge Condition: Left AGAINST MEDICAL ADVICE  Diet recommendation: Left AGAINST MEDICAL ADVICE  Wound care: None.  Code status: Full.   History of Present Illness:   Patient is a 58 year old female with past medical history of COPD, anemia, GERD, asthma, hypertension presented to the hospital with subjective fever, productive cough and shortness of breath worse for the last day or 2.  Of note patient was recently admitted to the hospital for pneumonia.  At home patient was using her nebulizer but was not getting much relief so she decided to come to the hospital.  In the ED, patient was noted to be in respiratory distress with extensive wheezing.  She was  tachypneic.  Pulse ox was 92% on 4 L of oxygen by nasal cannula but afebrile.  Labs were notable for WBC elevated at 13.6.  Venous blood gas with pCO2 of 30.  BMP was notable for albumin low at 3.2.  COVID influenza and RSV was negative.  Chest x-ray showed pulmonary vascular congestion with interstitial prominence and increasing airspace opacities at the left  lung base.  Patient received DuoNebs, Solu-Medrol , Rocephin  and doxycycline  and was considered for admission to the hospital for further evaluation and treatment.    Hospital Course:   Following conditions were addressed during hospitalization as listed below,  Left basal pneumonia.  Patient received Rocephin  and doxycycline , oxygen, nebulizers and steroids.  Still symptomatic with cough wheezing and shortness of breath on supplemental oxygen.  Despite explaining the need  for antibiotics and further treatment, patient has decided to leave AGAINST MEDICAL ADVICE.   Acute COPD exacerbation likely secondary to pneumonia with acute hypoxic respiratory failure. Patient had acute respiratory distress with dyspnea, shortness of breath and wheezing on presentation with tachypnea and was saturating 90 percent on room air.  COVID influenza and RSV was negative.  Patient received oxygen nebulizer, IV and inhaled steroids, antibiotics with Rocephin  and doxycycline .  Patient has decided to leave AGAINST MEDICAL ADVICE  History of hypertension.  On HCTZ, lisinopril .   Chronic cigarette smoker.  Counseling done.    History of anxiety.  On Xanax , duloxetine  at home.    History of complex regional syndrome.  On gabapentin , oxycodone , naproxen  at home.   Disposition.  At this time, patient has decided to leave AGAINST MEDICAL ADVICE despite explaining the need for being in the hospital for for adequate treatment in this patient life-threatening complications.  Medical Consultants:   None.  Procedures:    None Subjective:   Today, patient was seen and examined at bedside.  Still had some significant wheezing cough and was on supplemental oxygen.  Nursing staff reported later on that patient had left AMA had spoken with the patient earlier regarding the need for being in the hospital  Discharge Exam:   Vitals:   07/05/24 1100 07/05/24 1150  BP: (!) 107/95   Pulse: (!) 101 89  Resp: 15 20  Temp:    SpO2: 100% 97%   Vitals:   07/05/24 0730 07/05/24 1000 07/05/24 1100 07/05/24 1150  BP: 121/75 110/74 (!) 107/95   Pulse: 64 66 (!) 101 89  Resp:  18 17 15 20   Temp:      TempSrc:      SpO2: 94% 100% 100% 97%  Weight:      Height:        General: Alert awake, not in obvious distress, nasal cannula HENT: pupils equally reacting to light,  No scleral pallor or icterus noted. Oral mucosa is moist.  Chest: Diffuse bilateral wheezing. CVS: S1 &S2 heard. No murmur.   Regular rate and rhythm. Abdomen: Soft, nontender, nondistended.  Bowel sounds are heard.   Extremities: No cyanosis, clubbing or edema.  Peripheral pulses are palpable. Psych: Alert, awake and oriented, normal mood CNS:  No cranial nerve deficits.  Power equal in all extremities.   Skin: Warm and dry.  No rashes noted.  The results of significant diagnostics from this hospitalization (including imaging, microbiology, ancillary and laboratory) are listed below for reference.     Diagnostic Studies:   DG Chest Portable 1 View Result Date: 07/04/2024 CLINICAL DATA:  Shortness of breath, history of COPD. EXAM: PORTABLE CHEST 1 VIEW COMPARISON:  None Available. FINDINGS: The heart size and mediastinal contours are stable. The pulmonary vasculature is distended with interstitial prominence bilaterally. Increased airspace disease is noted at the left lung base. No effusion or pneumothorax is seen. IMPRESSION: 1. Pulmonary vascular congestion. 2. Interstitial prominence bilaterally and increasing airspace disease at the left lung base, possible edema or infiltrate. Electronically Signed   By: Leita Birmingham M.D.   On: 07/04/2024 15:53     Labs:   Basic Metabolic Panel: Recent Labs  Lab 07/04/24 1430 07/04/24 1502 07/05/24 0248  NA 139 140 141  K 3.6 3.6 3.6  CL 102  --  107  CO2 26  --  24  GLUCOSE 102*  --  124*  BUN 6  --  13  CREATININE 0.63  --  0.62  CALCIUM 9.2  --  8.1*  MG  --   --  2.0   GFR Estimated Creatinine Clearance: 84.9 mL/min (by C-G formula based on SCr of 0.62 mg/dL). Liver Function Tests: Recent Labs  Lab 07/04/24 1430  AST 23  ALT 12  ALKPHOS 70  BILITOT 0.9  PROT 7.2  ALBUMIN 3.2*   No results for input(s): LIPASE, AMYLASE in the last 168 hours. No results for input(s): AMMONIA in the last 168 hours. Coagulation profile No results for input(s): INR, PROTIME in the last 168 hours.  CBC: Recent Labs  Lab 07/04/24 1430 07/04/24 1502  07/05/24 0248  WBC 13.6*  --  13.9*  HGB 13.5 13.9 12.3  HCT 42.4 41.0 39.0  MCV 84.5  --  83.9  PLT 253  --  262   Cardiac Enzymes: No results for input(s): CKTOTAL, CKMB, CKMBINDEX, TROPONINI in the last 168 hours. BNP: Invalid input(s): POCBNP CBG: No results for input(s): GLUCAP in the last 168 hours. D-Dimer No results for input(s): DDIMER in the last 72 hours. Hgb A1c No results for input(s): HGBA1C in the last 72 hours. Lipid Profile No results for input(s): CHOL, HDL, LDLCALC, TRIG, CHOLHDL, LDLDIRECT in the last 72 hours. Thyroid function studies No results for input(s): TSH, T4TOTAL, T3FREE, THYROIDAB in the last 72 hours.  Invalid input(s): FREET3 Anemia work up No results for input(s): VITAMINB12, FOLATE, FERRITIN, TIBC, IRON, RETICCTPCT in the last 72 hours. Microbiology Recent Results (from the past 240 hours)  Resp panel by RT-PCR (RSV, Flu A&B, Covid) Anterior Nasal Swab     Status: None   Collection Time:  07/04/24  2:48 PM   Specimen: Anterior Nasal Swab  Result Value Ref Range Status   SARS Coronavirus 2 by RT PCR NEGATIVE NEGATIVE Final   Influenza A by PCR NEGATIVE NEGATIVE Final   Influenza B by PCR NEGATIVE NEGATIVE Final    Comment: (NOTE) The Xpert Xpress SARS-CoV-2/FLU/RSV plus assay is intended as an aid in the diagnosis of influenza from Nasopharyngeal swab specimens and should not be used as a sole basis for treatment. Nasal washings and aspirates are unacceptable for Xpert Xpress SARS-CoV-2/FLU/RSV testing.  Fact Sheet for Patients: bloggercourse.com  Fact Sheet for Healthcare Providers: seriousbroker.it  This test is not yet approved or cleared by the [place]  FDA and has been authorized for detection and/or diagnosis of SARS-CoV-2 by FDA under an Emergency Use Authorization (EUA). This EUA will remain in effect (meaning this test can  be used) for the duration of the COVID-19 declaration under Section 564(b)(1) of the Act, 21 U.S.C. section 360bbb-3(b)(1), unless the authorization is terminated or revoked.     Resp Syncytial Virus by PCR NEGATIVE NEGATIVE Final    Comment: (NOTE) Fact Sheet for Patients: bloggercourse.com  Fact Sheet for Healthcare Providers: seriousbroker.it  This test is not yet approved or cleared by the [place]  FDA and has been authorized for detection and/or diagnosis of SARS-CoV-2 by FDA under an Emergency Use Authorization (EUA). This EUA will remain in effect (meaning this test can be used) for the duration of the COVID-19 declaration under Section 564(b)(1) of the Act, 21 U.S.C. section 360bbb-3(b)(1), unless the authorization is terminated or revoked.  Performed at [facility], [address], [place], [place] [address]      Discharge Instructions:    Left AGAINST MEDICAL ADVICE    Time coordinating discharge: 39 minutes  Signed:  Martavia Tye  [facility] 07/05/2024, 1:30 PM

## 2024-07-06 LAB — RESPIRATORY PANEL BY PCR

## 2024-08-19 ENCOUNTER — Ambulatory Visit (HOSPITAL_COMMUNITY)
Admission: EM | Admit: 2024-08-19 | Discharge: 2024-08-19 | Disposition: A | Discharge status: Home / Self Care | Attending: Family Medicine | Admitting: Family Medicine

## 2024-08-19 ENCOUNTER — Encounter (HOSPITAL_COMMUNITY): Payer: Self-pay

## 2024-08-19 DIAGNOSIS — R112 Nausea with vomiting, unspecified: Secondary | ICD-10-CM | POA: Diagnosis not present

## 2024-08-19 DIAGNOSIS — K529 Noninfective gastroenteritis and colitis, unspecified: Secondary | ICD-10-CM | POA: Diagnosis not present

## 2024-08-19 MED ORDER — ONDANSETRON 4 MG PO TBDP
ORAL_TABLET | ORAL | Status: AC
  Filled 2024-08-19: qty 1

## 2024-08-19 MED ORDER — ONDANSETRON 4 MG PO TBDP
4.0000 mg | ORAL_TABLET | Freq: Once | ORAL | Status: AC
  Administered 2024-08-19: 4 mg via ORAL

## 2024-08-19 NOTE — ED Provider Notes (Signed)
 [facility]    CSN: [id number] Arrival date & time: 08/19/24  1158      History   Chief Complaint Chief Complaint  Patient presents with   Emesis    HPI Joan [name] is a 58 y.o. female.   This 58 year old female is being seen for complaints of nausea, vomiting for 3 days.  She reports 3 episodes of emesis yesterday and 1 episode this morning.  She reports her stools are softer than usual, but denies loose or runny stools.  She reports history of IBS with constipation.  She denies fever, chills, headaches, dizziness.  She reports feeling more fatigued than usual.  She reports decreased appetite.  She denies abdominal pain, urinary complaints.   Emesis Associated symptoms: no abdominal pain, no chills, no cough, no diarrhea, no fever, no headaches and no sore throat     Past Medical History:  Diagnosis Date   Anemia    Anxiety    Asthma    Chronic back pain    Chronic neck pain    Complex regional pain syndrome    COPD (chronic obstructive pulmonary disease) (HCC)    GERD (gastroesophageal reflux disease)    Hematuria 09/07/2013   Hypertension    IBS (irritable bowel syndrome)    Migraine    Paresthesia of left arm     Patient Active Problem List   Diagnosis Date Noted   COPD with acute exacerbation (HCC) 07/04/2024   Encephalopathy acute 11/03/2023   Seizure disorder (HCC) 11/03/2023   Acute encephalopathy 11/03/2023   Leukocytosis 10/29/2021   Obesity (BMI 30-39.9) 10/28/2021   Acute on chronic respiratory failure with hypoxia (HCC) 10/28/2021   Narcotic dependence (HCC) 10/28/2021   AKI (acute kidney injury) 10/28/2021   Anxiety and depression 10/28/2021   COPD (chronic obstructive pulmonary disease) (HCC) 10/27/2021   Opiate dependence (HCC) 06/03/2021   ILD (interstitial lung disease) (HCC) 06/03/2021   Prolonged QT interval 03/16/2020   Asthma, chronic, unspecified asthma severity, with acute exacerbation 05/03/2018   Chest pain  05/03/2018   Cigarette smoker 05/03/2018   Pulmonary infiltrate 05/03/2018   Hypokalemia 03/05/2018   Depression 03/05/2018   Anxiety 03/05/2018   Benign essential HTN 03/05/2018   SIRS (systemic inflammatory response syndrome) (HCC)    SOB (shortness of breath)    Chronic pain syndrome    CAP (community acquired pneumonia) 06/05/2016   Acute respiratory failure with hypoxia (HCC) 06/05/2016   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) 06/27/2014   Spinal stenosis in cervical region 06/27/2014   Back pain 09/07/2013   Hematuria 09/07/2013   Endometrium, polyp 08/17/2013   Anemia 08/13/2013    Past Surgical History:  Procedure Laterality Date   BIOPSY  10/31/2021   Procedure: BIOPSY;  Surgeon: Alaine Vicenta NOVAK, MD;  Location: [facility] ENDOSCOPY;  Service: Cardiopulmonary;;   BRONCHIAL NEEDLE ASPIRATION BIOPSY  10/31/2021   Procedure: BRONCHIAL NEEDLE ASPIRATION BIOPSIES;  Surgeon: Alaine Vicenta NOVAK, MD;  Location: [facility] ENDOSCOPY;  Service: Cardiopulmonary;;   BRONCHIAL WASHINGS  10/31/2021   Procedure: BRONCHIAL WASHINGS;  Surgeon: Alaine Vicenta NOVAK, MD;  Location: [facility] ENDOSCOPY;  Service: Cardiopulmonary;;   DILITATION & CURRETTAGE/HYSTROSCOPY WITH THERMACHOICE ABLATION N/A 08/17/2013   Procedure: DILATATION & CURETTAGE/HYSTEROSCOPY WITH THERMACHOICE ABLATION;  Surgeon: Norleen LULLA Server, MD;  Location: [facility] ORS;  Service: Gynecology;  Laterality: N/A;  18 ml D5W in & out; total therapy time= 9 minutes 33 seconds; temp= 87 degrees celcius   HERNIA REPAIR     ventral  hernia   POLYPECTOMY N/A 08/17/2013   Procedure: REMOVAL ENDOMETRIAL POLYP;  Surgeon: Norleen LULLA Server, MD;  Location: [facility];  Service: Gynecology;  Laterality: N/A;   TUBAL LIGATION     ULNAR NERVE REPAIR Left    [facility] 2013   VIDEO BRONCHOSCOPY WITH ENDOBRONCHIAL ULTRASOUND N/A 10/31/2021   Procedure: VIDEO BRONCHOSCOPY WITH ENDOBRONCHIAL ULTRASOUND;  Surgeon: Alaine Vicenta NOVAK, MD;  Location: [facility] ENDOSCOPY;  Service:  Cardiopulmonary;  Laterality: N/A;    OB History     Gravida  4   Para  4   Term  4   Preterm      AB      Living  4      SAB      IAB      Ectopic      Multiple      Live Births               Home Medications    Prior to Admission medications   Medication Sig Start Date End Date Taking? Authorizing Provider  albuterol  (PROVENTIL ) (2.5 MG/3ML) 0.083% nebulizer solution Take 3 mLs (2.5 mg total) by nebulization every 6 (six) hours as needed for wheezing or shortness of breath. Patient not taking: Reported on 11/03/2023 03/18/20 01/07/22  Christobal Guadalajara, MD  albuterol  (VENTOLIN  HFA) 108 (90 Base) MCG/ACT inhaler Inhale 2 puffs into the lungs every 4 (four) hours as needed for wheezing or shortness of breath. 02/05/22   Darlean Ozell NOVAK, MD  ALPRAZolam  (XANAX ) 1 MG tablet Take 1 mg by mouth 3 (three) times daily.    [provider]  baclofen  (LIORESAL ) 10 MG tablet Take 10 mg by mouth in the morning, at noon, and at bedtime. Morning, afternon, and    [provider]  divalproex  (DEPAKOTE ) 500 MG DR tablet Take 1 tablet (500 mg total) by mouth daily. 06/07/21   Vann, Jessica U, DO  DULoxetine  (CYMBALTA ) 60 MG capsule Take 1 capsule (60 mg total) by mouth in the morning and at bedtime. 06/07/21   Vann, Jessica U, DO  gabapentin  (NEURONTIN ) 300 MG capsule Take 600 mg by mouth 4 (four) times daily. 05/14/16   [provider]  hydrochlorothiazide  (HYDRODIURIL ) 50 MG tablet Take 1 tablet (50 mg total) by mouth daily. 06/07/21   Vann, Jessica U, DO  lisinopril  (ZESTRIL ) 20 MG tablet Take 20 mg by mouth daily. 01/07/20   [provider]  mometasone -formoterol  (DULERA ) 200-5 MCG/ACT AERO Inhale 2 puffs into the lungs 2 (two) times daily. 11/04/23   Neda Jennet LABOR, MD  naproxen  (NAPROSYN ) 500 MG tablet Take 500 mg by mouth 2 (two) times daily with a meal. 09/15/20   [provider]  oxyCODONE -acetaminophen  (PERCOCET) 10-325 MG tablet Take 1  tablet by mouth every 6 (six) hours as needed for pain.    [provider]    Family History Family History  Problem Relation Age of Onset   Diabetes Sister    Hypertension Sister    Heart disease Sister    Cancer Sister        brain tumor   Heart disease Brother    Diabetes Brother    Cancer Maternal Aunt        uterine   Cancer Maternal Grandmother        uterine   Other Mother        car accident    Social History Social History   Tobacco Use   Smoking status: Every Day  Current packs/day: 1.00    Average packs/day: 1 pack/day for 26.0 years (26.0 ttl pk-yrs)    Types: Cigarettes   Smokeless tobacco: Never   Tobacco comments:    04/23/16 1/2- 3/4 PPD  Vaping Use   Vaping status: Never Used  Substance Use Topics   Alcohol use: No    Comment: unknown at this time 11/02/23   Drug use: No    Comment: unknown at this time 11/02/23     Allergies   Gabapentin  and Other   Review of Systems Review of Systems  Constitutional:  Positive for appetite change and fatigue. Negative for chills and fever.  HENT:  Negative for sore throat.   Eyes:  Negative for pain and visual disturbance.  Respiratory:  Negative for cough and shortness of breath.   Cardiovascular:  Negative for chest pain.  Gastrointestinal:  Positive for nausea and vomiting. Negative for abdominal pain and diarrhea.  Genitourinary:  Negative for difficulty urinating, dysuria, frequency and urgency.  Skin:  Negative for color change.  Neurological:  Negative for dizziness, syncope and headaches.  All other systems reviewed and are negative.    Physical Exam Triage Vital Signs ED Triage Vitals  Encounter Vitals Group     BP 08/19/24 1237 131/84     Girls Systolic BP Percentile --      Girls Diastolic BP Percentile --      Boys Systolic BP Percentile --      Boys Diastolic BP Percentile --      Pulse Rate 08/19/24 1237 73     Resp 08/19/24 1237 16     Temp 08/19/24 1237 98.7 F (37.1 C)      Temp Source 08/19/24 1237 Oral     SpO2 08/19/24 1237 96 %     Weight --      Height --      Head Circumference --      Peak Flow --      Pain Score 08/19/24 1234 0     Pain Loc --      Pain Education --      Exclude from Growth Chart --    No data found.  Updated Vital Signs BP 131/84 (BP Location: Right Arm)   Pulse 73   Temp 98.7 F (37.1 C) (Oral)   Resp 16   SpO2 96%   Visual Acuity Right Eye Distance:   Left Eye Distance:   Bilateral Distance:    Right Eye Near:   Left Eye Near:    Bilateral Near:     Physical Exam Vitals and nursing note reviewed.  Constitutional:      General: She is not in acute distress.    Appearance: She is well-developed. She is not ill-appearing or toxic-appearing.     Comments: Pleasant female appearing stated age found sitting in chair in no acute distress.  HENT:     Head: Normocephalic and atraumatic.     Nose: Nose normal.     Mouth/Throat:     Lips: Pink.     Mouth: Mucous membranes are moist.  Eyes:     Conjunctiva/sclera: Conjunctivae normal.  Cardiovascular:     Rate and Rhythm: Normal rate and regular rhythm.     Heart sounds: Normal heart sounds. No murmur heard. Pulmonary:     Effort: Pulmonary effort is normal. No respiratory distress.     Breath sounds: Normal breath sounds.  Abdominal:     General: Bowel sounds are normal.  Palpations: Abdomen is soft.     Tenderness: There is no abdominal tenderness. There is no guarding.  Musculoskeletal:        General: No swelling.  Skin:    General: Skin is warm and dry.     Capillary Refill: Capillary refill takes less than 2 seconds.  Neurological:     Mental Status: She is alert.  Psychiatric:        Mood and Affect: Mood normal.        Behavior: Behavior is cooperative.      UC Treatments / Results  Labs (all labs ordered are listed, but only abnormal results are displayed) Labs Reviewed - No data to display  EKG   Radiology No results  found.  Procedures Procedures (including critical care time)  Medications Ordered in UC Medications  ondansetron  (ZOFRAN -ODT) disintegrating tablet 4 mg (4 mg Oral Given 08/19/24 1313)    Initial Impression / Assessment and Plan / UC Course  I have reviewed the triage vital signs and the nursing notes.  Pertinent labs & imaging results that were available during my care of the patient were reviewed by me and considered in my medical decision making (see chart for details).     Vitals and triage reviewed, patient is hemodynamically stable.  Suspect viral gastroenteritis.  She has a son with similar symptoms.  She denies abdominal pain, fever, chills, urinary symptoms.  She was administered 4 mg ondansetron  in clinic today.  Nursing reports she initially refused medication, however did take it eventually.  Nurse presented to perform p.o. challenge, patient refused to drink water.  Patient stated she was ready to be discharged.  She is provided prescription for ondansetron .  Upon reassessment, patient drank some water and has tolerated.  Plan of care, follow-up care, return precautions given, no questions at this time.  Work note provided per patient request. Final Clinical Impressions(s) / UC Diagnoses   Final diagnoses:  Nausea and vomiting, unspecified vomiting type  Gastroenteritis     Discharge Instructions      You likely have viral gastroenteritis. You have been provided a prescription for ondansetron .  Take 1 tablet every 8 hours as needed for nausea/vomiting. You can take Tylenol /acetaminophen  and/or Advil/ibuprofen for fever, pain. Drink plenty of fluids to keep yourself hydrated. Once you feel like eating, start with foods that are easy to digest such as crackers, toast, rice, applesauce, bananas.  You can also try broth soups.  Advance diet as tolerated. If you develop any new or worsening symptoms or if her symptoms do not start to improve, please return here or follow-up  with your primary care provider.  If your symptoms are severe, please go to the emergency room.     ED Prescriptions   None    I have reviewed the PDMP during this encounter.   Lennice Jon BROCKS, FNP 08/19/24 [phone]

## 2024-08-19 NOTE — ED Triage Notes (Signed)
 Patient c/o n/V x 2 days. Patient states she has vomited x 3 total. Patient deneis abdominal pain.  Patient denies taking medication for her symptoms.

## 2024-08-19 NOTE — ED Notes (Signed)
 Patient refused po challenge. Patient stated, I just want to go home. Provider notified.

## 2024-08-19 NOTE — Discharge Instructions (Addendum)
 You likely have viral gastroenteritis. You have been provided a prescription for ondansetron .  Take 1 tablet every 8 hours as needed for nausea/vomiting. You can take Tylenol /acetaminophen  and/or Advil/ibuprofen for fever, pain. Drink plenty of fluids to keep yourself hydrated. Once you feel like eating, start with foods that are easy to digest such as crackers, toast, rice, applesauce, bananas.  You can also try broth soups.  Advance diet as tolerated. If you develop any new or worsening symptoms or if her symptoms do not start to improve, please return here or follow-up with your primary care provider.  If your symptoms are severe, please go to the emergency room.
